# Patient Record
Sex: Female | Born: 1935 | Race: White | Hispanic: No | Marital: Married | Smoking: Former smoker
Health system: Southern US, Community
[De-identification: ages and names within clinical notes are randomized; demographics above are authoritative.]

## PROBLEM LIST (undated history)

## (undated) DIAGNOSIS — Z87442 Personal history of urinary calculi: Secondary | ICD-10-CM

## (undated) DIAGNOSIS — I739 Peripheral vascular disease, unspecified: Secondary | ICD-10-CM

## (undated) DIAGNOSIS — M1990 Unspecified osteoarthritis, unspecified site: Secondary | ICD-10-CM

## (undated) DIAGNOSIS — J449 Chronic obstructive pulmonary disease, unspecified: Secondary | ICD-10-CM

## (undated) DIAGNOSIS — M8580 Other specified disorders of bone density and structure, unspecified site: Secondary | ICD-10-CM

## (undated) DIAGNOSIS — Z8551 Personal history of malignant neoplasm of bladder: Secondary | ICD-10-CM

## (undated) DIAGNOSIS — K648 Other hemorrhoids: Secondary | ICD-10-CM

## (undated) DIAGNOSIS — E669 Obesity, unspecified: Secondary | ICD-10-CM

## (undated) DIAGNOSIS — K2970 Gastritis, unspecified, without bleeding: Secondary | ICD-10-CM

## (undated) DIAGNOSIS — E042 Nontoxic multinodular goiter: Secondary | ICD-10-CM

## (undated) DIAGNOSIS — E041 Nontoxic single thyroid nodule: Secondary | ICD-10-CM

## (undated) DIAGNOSIS — I872 Venous insufficiency (chronic) (peripheral): Secondary | ICD-10-CM

## (undated) DIAGNOSIS — I10 Essential (primary) hypertension: Secondary | ICD-10-CM

## (undated) DIAGNOSIS — E785 Hyperlipidemia, unspecified: Secondary | ICD-10-CM

## (undated) DIAGNOSIS — K219 Gastro-esophageal reflux disease without esophagitis: Secondary | ICD-10-CM

## (undated) DIAGNOSIS — Z7901 Long term (current) use of anticoagulants: Secondary | ICD-10-CM

## (undated) DIAGNOSIS — Z860100 Personal history of colon polyps, unspecified: Secondary | ICD-10-CM

## (undated) DIAGNOSIS — F419 Anxiety disorder, unspecified: Secondary | ICD-10-CM

## (undated) DIAGNOSIS — I4821 Permanent atrial fibrillation: Secondary | ICD-10-CM

## (undated) DIAGNOSIS — Z85118 Personal history of other malignant neoplasm of bronchus and lung: Secondary | ICD-10-CM

## (undated) DIAGNOSIS — F40240 Claustrophobia: Secondary | ICD-10-CM

## (undated) DIAGNOSIS — I4891 Unspecified atrial fibrillation: Secondary | ICD-10-CM

## (undated) DIAGNOSIS — K5730 Diverticulosis of large intestine without perforation or abscess without bleeding: Secondary | ICD-10-CM

## (undated) HISTORY — DX: Personal history of malignant neoplasm of bladder: Z85.51

## (undated) HISTORY — DX: Obesity, unspecified: E66.9

## (undated) HISTORY — DX: Other hemorrhoids: K64.8

## (undated) HISTORY — DX: Personal history of urinary calculi: Z87.442

## (undated) HISTORY — PX: ESOPHAGOGASTRODUODENOSCOPY: SHX1529

## (undated) HISTORY — DX: Claustrophobia: F40.240

## (undated) HISTORY — DX: Chronic obstructive pulmonary disease, unspecified: J44.9

## (undated) HISTORY — DX: Gastritis, unspecified, without bleeding: K29.70

## (undated) HISTORY — DX: Other specified disorders of bone density and structure, unspecified site: M85.80

## (undated) HISTORY — DX: Hyperlipidemia, unspecified: E78.5

## (undated) HISTORY — DX: Personal history of colonic polyps: Z86.010

## (undated) HISTORY — DX: Diverticulosis of large intestine without perforation or abscess without bleeding: K57.30

## (undated) HISTORY — DX: Personal history of other malignant neoplasm of bronchus and lung: Z85.118

## (undated) HISTORY — DX: Personal history of colon polyps, unspecified: Z86.0100

## (undated) HISTORY — DX: Nontoxic single thyroid nodule: E04.1

## (undated) HISTORY — DX: Long term (current) use of anticoagulants: Z79.01

## (undated) HISTORY — DX: Essential (primary) hypertension: I10

## (undated) HISTORY — DX: Anxiety disorder, unspecified: F41.9

## (undated) HISTORY — DX: Gastro-esophageal reflux disease without esophagitis: K21.9

## (undated) HISTORY — DX: Unspecified osteoarthritis, unspecified site: M19.90

## (undated) HISTORY — PX: COLONOSCOPY: SHX174

## (undated) HISTORY — DX: Nontoxic multinodular goiter: E04.2

## (undated) HISTORY — DX: Peripheral vascular disease, unspecified: I73.9

## (undated) HISTORY — DX: Venous insufficiency (chronic) (peripheral): I87.2

---

## 1989-02-04 HISTORY — PX: OTHER SURGICAL HISTORY: SHX169

## 1998-12-12 ENCOUNTER — Other Ambulatory Visit: Admission: RE | Admit: 1998-12-12 | Discharge: 1998-12-12 | Payer: Self-pay | Admitting: *Deleted

## 1999-02-07 ENCOUNTER — Encounter: Payer: Self-pay | Admitting: Orthopedic Surgery

## 1999-02-07 ENCOUNTER — Encounter: Admission: RE | Admit: 1999-02-07 | Discharge: 1999-02-07 | Payer: Self-pay | Admitting: Orthopedic Surgery

## 1999-03-05 ENCOUNTER — Other Ambulatory Visit: Admission: RE | Admit: 1999-03-05 | Discharge: 1999-03-05 | Payer: Self-pay | Admitting: Orthopedic Surgery

## 1999-11-05 HISTORY — PX: OTHER SURGICAL HISTORY: SHX169

## 1999-11-06 ENCOUNTER — Encounter: Payer: Self-pay | Admitting: Urology

## 1999-11-08 ENCOUNTER — Encounter: Payer: Self-pay | Admitting: Urology

## 1999-11-12 ENCOUNTER — Ambulatory Visit (HOSPITAL_COMMUNITY): Admission: RE | Admit: 1999-11-12 | Discharge: 1999-11-12 | Payer: Self-pay | Admitting: Urology

## 1999-11-12 ENCOUNTER — Encounter (INDEPENDENT_AMBULATORY_CARE_PROVIDER_SITE_OTHER): Payer: Self-pay

## 2000-02-05 HISTORY — PX: OTHER SURGICAL HISTORY: SHX169

## 2000-03-24 ENCOUNTER — Ambulatory Visit (HOSPITAL_COMMUNITY): Admission: RE | Admit: 2000-03-24 | Discharge: 2000-03-24 | Payer: Self-pay | Admitting: Pulmonary Disease

## 2000-03-24 ENCOUNTER — Encounter: Payer: Self-pay | Admitting: Pulmonary Disease

## 2000-04-09 ENCOUNTER — Encounter: Payer: Self-pay | Admitting: Orthopedic Surgery

## 2000-04-16 ENCOUNTER — Encounter: Payer: Self-pay | Admitting: Orthopedic Surgery

## 2000-04-16 ENCOUNTER — Inpatient Hospital Stay (HOSPITAL_COMMUNITY): Admission: RE | Admit: 2000-04-16 | Discharge: 2000-04-21 | Payer: Self-pay | Admitting: Orthopedic Surgery

## 2000-05-05 ENCOUNTER — Encounter: Admission: RE | Admit: 2000-05-05 | Discharge: 2000-05-28 | Payer: Self-pay | Admitting: Orthopedic Surgery

## 2000-06-04 ENCOUNTER — Inpatient Hospital Stay (HOSPITAL_COMMUNITY): Admission: RE | Admit: 2000-06-04 | Discharge: 2000-06-05 | Payer: Self-pay | Admitting: Orthopedic Surgery

## 2002-06-14 ENCOUNTER — Other Ambulatory Visit: Admission: RE | Admit: 2002-06-14 | Discharge: 2002-06-14 | Payer: Self-pay | Admitting: Obstetrics and Gynecology

## 2002-12-27 ENCOUNTER — Ambulatory Visit (HOSPITAL_COMMUNITY): Admission: RE | Admit: 2002-12-27 | Discharge: 2002-12-27 | Payer: Self-pay | Admitting: Pulmonary Disease

## 2003-01-05 ENCOUNTER — Ambulatory Visit (HOSPITAL_COMMUNITY): Admission: RE | Admit: 2003-01-05 | Discharge: 2003-01-05 | Payer: Self-pay | Admitting: Pulmonary Disease

## 2003-01-05 ENCOUNTER — Encounter (INDEPENDENT_AMBULATORY_CARE_PROVIDER_SITE_OTHER): Payer: Self-pay | Admitting: *Deleted

## 2003-01-17 ENCOUNTER — Ambulatory Visit (HOSPITAL_COMMUNITY): Admission: RE | Admit: 2003-01-17 | Discharge: 2003-01-17 | Payer: Self-pay | Admitting: Pulmonary Disease

## 2003-05-09 ENCOUNTER — Ambulatory Visit (HOSPITAL_COMMUNITY): Admission: RE | Admit: 2003-05-09 | Discharge: 2003-05-09 | Payer: Self-pay | Admitting: Pulmonary Disease

## 2003-11-25 ENCOUNTER — Ambulatory Visit (HOSPITAL_COMMUNITY): Admission: RE | Admit: 2003-11-25 | Discharge: 2003-11-25 | Payer: Self-pay | Admitting: Pulmonary Disease

## 2003-12-06 HISTORY — PX: OTHER SURGICAL HISTORY: SHX169

## 2003-12-21 ENCOUNTER — Ambulatory Visit: Payer: Self-pay | Admitting: Pulmonary Disease

## 2004-01-03 ENCOUNTER — Encounter (INDEPENDENT_AMBULATORY_CARE_PROVIDER_SITE_OTHER): Payer: Self-pay | Admitting: Specialist

## 2004-01-03 ENCOUNTER — Inpatient Hospital Stay (HOSPITAL_COMMUNITY): Admission: RE | Admit: 2004-01-03 | Discharge: 2004-01-08 | Payer: Self-pay | Admitting: Thoracic Surgery

## 2004-01-03 ENCOUNTER — Ambulatory Visit: Payer: Self-pay | Admitting: Internal Medicine

## 2004-01-16 ENCOUNTER — Ambulatory Visit: Payer: Self-pay | Admitting: Internal Medicine

## 2004-01-19 ENCOUNTER — Encounter: Admission: RE | Admit: 2004-01-19 | Discharge: 2004-01-19 | Payer: Self-pay | Admitting: Thoracic Surgery

## 2004-02-21 ENCOUNTER — Encounter: Admission: RE | Admit: 2004-02-21 | Discharge: 2004-02-21 | Payer: Self-pay | Admitting: Thoracic Surgery

## 2004-03-02 ENCOUNTER — Ambulatory Visit (HOSPITAL_COMMUNITY): Admission: RE | Admit: 2004-03-02 | Discharge: 2004-03-02 | Payer: Self-pay | Admitting: Internal Medicine

## 2004-03-05 ENCOUNTER — Ambulatory Visit: Payer: Self-pay | Admitting: Internal Medicine

## 2004-03-12 ENCOUNTER — Encounter: Admission: RE | Admit: 2004-03-12 | Discharge: 2004-03-12 | Payer: Self-pay | Admitting: Internal Medicine

## 2004-03-21 ENCOUNTER — Ambulatory Visit: Payer: Self-pay | Admitting: Pulmonary Disease

## 2004-04-04 ENCOUNTER — Encounter: Admission: RE | Admit: 2004-04-04 | Discharge: 2004-04-04 | Payer: Self-pay | Admitting: Thoracic Surgery

## 2004-06-08 ENCOUNTER — Ambulatory Visit: Payer: Self-pay | Admitting: Internal Medicine

## 2004-06-14 ENCOUNTER — Ambulatory Visit (HOSPITAL_COMMUNITY): Admission: RE | Admit: 2004-06-14 | Discharge: 2004-06-14 | Payer: Self-pay | Admitting: Internal Medicine

## 2004-08-24 ENCOUNTER — Ambulatory Visit: Payer: Self-pay | Admitting: Pulmonary Disease

## 2004-09-05 ENCOUNTER — Encounter: Admission: RE | Admit: 2004-09-05 | Discharge: 2004-09-05 | Payer: Self-pay | Admitting: Thoracic Surgery

## 2004-11-23 ENCOUNTER — Ambulatory Visit: Payer: Self-pay | Admitting: Pulmonary Disease

## 2004-12-10 ENCOUNTER — Ambulatory Visit: Payer: Self-pay | Admitting: Internal Medicine

## 2004-12-13 ENCOUNTER — Ambulatory Visit (HOSPITAL_COMMUNITY): Admission: RE | Admit: 2004-12-13 | Discharge: 2004-12-13 | Payer: Self-pay | Admitting: Internal Medicine

## 2005-03-13 ENCOUNTER — Encounter: Admission: RE | Admit: 2005-03-13 | Discharge: 2005-03-13 | Payer: Self-pay | Admitting: Thoracic Surgery

## 2005-04-05 ENCOUNTER — Encounter: Admission: RE | Admit: 2005-04-05 | Discharge: 2005-04-05 | Payer: Self-pay | Admitting: Endocrinology

## 2005-04-11 ENCOUNTER — Other Ambulatory Visit: Admission: RE | Admit: 2005-04-11 | Discharge: 2005-04-11 | Payer: Self-pay | Admitting: Diagnostic Radiology

## 2005-04-11 ENCOUNTER — Encounter (INDEPENDENT_AMBULATORY_CARE_PROVIDER_SITE_OTHER): Payer: Self-pay | Admitting: Specialist

## 2005-04-11 ENCOUNTER — Encounter: Admission: RE | Admit: 2005-04-11 | Discharge: 2005-04-11 | Payer: Self-pay | Admitting: Endocrinology

## 2005-04-29 ENCOUNTER — Ambulatory Visit: Payer: Self-pay | Admitting: Pulmonary Disease

## 2005-05-31 ENCOUNTER — Ambulatory Visit: Payer: Self-pay | Admitting: Pulmonary Disease

## 2005-06-06 ENCOUNTER — Ambulatory Visit: Payer: Self-pay | Admitting: Internal Medicine

## 2005-06-10 LAB — CBC WITH DIFFERENTIAL/PLATELET
BASO%: 0.6 % (ref 0.0–2.0)
Basophils Absolute: 0 10*3/uL (ref 0.0–0.1)
EOS%: 1.8 % (ref 0.0–7.0)
HCT: 41.3 % (ref 34.8–46.6)
HGB: 14.2 g/dL (ref 11.6–15.9)
MCH: 33.2 pg (ref 26.0–34.0)
MONO#: 0.5 10*3/uL (ref 0.1–0.9)
NEUT#: 4.3 10*3/uL (ref 1.5–6.5)
NEUT%: 63.5 % (ref 39.6–76.8)
RDW: 12.8 % (ref 11.3–14.5)
WBC: 6.8 10*3/uL (ref 3.9–10.0)
lymph#: 1.9 10*3/uL (ref 0.9–3.3)

## 2005-06-10 LAB — COMPREHENSIVE METABOLIC PANEL
ALT: 40 U/L (ref 0–40)
AST: 28 U/L (ref 0–37)
Albumin: 4.6 g/dL (ref 3.5–5.2)
BUN: 22 mg/dL (ref 6–23)
CO2: 25 mEq/L (ref 19–32)
Calcium: 9.7 mg/dL (ref 8.4–10.5)
Chloride: 103 mEq/L (ref 96–112)
Creatinine, Ser: 0.7 mg/dL (ref 0.4–1.2)
Potassium: 4.5 mEq/L (ref 3.5–5.3)

## 2005-06-13 ENCOUNTER — Ambulatory Visit (HOSPITAL_COMMUNITY): Admission: RE | Admit: 2005-06-13 | Discharge: 2005-06-13 | Payer: Self-pay | Admitting: Internal Medicine

## 2005-08-15 ENCOUNTER — Ambulatory Visit: Payer: Self-pay | Admitting: Internal Medicine

## 2005-08-29 ENCOUNTER — Ambulatory Visit: Payer: Self-pay | Admitting: Pulmonary Disease

## 2005-09-11 ENCOUNTER — Encounter: Admission: RE | Admit: 2005-09-11 | Discharge: 2005-09-11 | Payer: Self-pay | Admitting: Thoracic Surgery

## 2005-09-12 ENCOUNTER — Ambulatory Visit: Payer: Self-pay | Admitting: Cardiology

## 2005-10-10 ENCOUNTER — Ambulatory Visit: Payer: Self-pay | Admitting: Cardiology

## 2005-11-18 ENCOUNTER — Ambulatory Visit: Payer: Self-pay

## 2005-11-18 LAB — CONVERTED CEMR LAB
ALT: 36 units/L (ref 0–40)
Total Bilirubin: 0.7 mg/dL (ref 0.3–1.2)
Total Protein: 7.3 g/dL (ref 6.0–8.3)

## 2005-12-06 ENCOUNTER — Ambulatory Visit: Payer: Self-pay | Admitting: Internal Medicine

## 2005-12-16 ENCOUNTER — Ambulatory Visit (HOSPITAL_COMMUNITY): Admission: RE | Admit: 2005-12-16 | Discharge: 2005-12-16 | Payer: Self-pay | Admitting: Internal Medicine

## 2005-12-16 LAB — CBC WITH DIFFERENTIAL/PLATELET
BASO%: 0.5 % (ref 0.0–2.0)
EOS%: 2.3 % (ref 0.0–7.0)
Eosinophils Absolute: 0.2 10*3/uL (ref 0.0–0.5)
LYMPH%: 24.9 % (ref 14.0–48.0)
MCHC: 34.4 g/dL (ref 32.0–36.0)
MCV: 97 fL (ref 81.0–101.0)
MONO%: 6.1 % (ref 0.0–13.0)
NEUT#: 4.8 10*3/uL (ref 1.5–6.5)
RBC: 4.66 10*6/uL (ref 3.70–5.32)
RDW: 12.3 % (ref 11.3–14.5)

## 2005-12-16 LAB — COMPREHENSIVE METABOLIC PANEL
ALT: 73 U/L — ABNORMAL HIGH (ref 0–35)
AST: 59 U/L — ABNORMAL HIGH (ref 0–37)
Albumin: 4.3 g/dL (ref 3.5–5.2)
Alkaline Phosphatase: 48 U/L (ref 39–117)
Glucose, Bld: 132 mg/dL — ABNORMAL HIGH (ref 70–99)
Potassium: 4.7 mEq/L (ref 3.5–5.3)
Sodium: 138 mEq/L (ref 135–145)
Total Bilirubin: 0.9 mg/dL (ref 0.3–1.2)
Total Protein: 7.8 g/dL (ref 6.0–8.3)

## 2005-12-16 LAB — LIPID PANEL
LDL Cholesterol: 75 mg/dL (ref 0–99)
VLDL: 19 mg/dL (ref 0–40)

## 2005-12-17 ENCOUNTER — Ambulatory Visit: Payer: Self-pay | Admitting: Pulmonary Disease

## 2006-01-02 ENCOUNTER — Ambulatory Visit: Payer: Self-pay | Admitting: Internal Medicine

## 2006-01-07 ENCOUNTER — Ambulatory Visit: Payer: Self-pay | Admitting: Pulmonary Disease

## 2006-02-19 ENCOUNTER — Ambulatory Visit: Payer: Self-pay | Admitting: Pulmonary Disease

## 2006-03-20 ENCOUNTER — Ambulatory Visit: Payer: Self-pay | Admitting: Pulmonary Disease

## 2006-03-20 LAB — CONVERTED CEMR LAB
Bilirubin, Direct: 0.1 mg/dL (ref 0.0–0.3)
LDL Cholesterol: 96 mg/dL (ref 0–99)
Total Bilirubin: 0.8 mg/dL (ref 0.3–1.2)
Total Protein: 7.1 g/dL (ref 6.0–8.3)

## 2006-03-26 ENCOUNTER — Encounter: Admission: RE | Admit: 2006-03-26 | Discharge: 2006-03-26 | Payer: Self-pay | Admitting: Thoracic Surgery

## 2006-03-26 ENCOUNTER — Ambulatory Visit: Payer: Self-pay | Admitting: Thoracic Surgery

## 2006-03-27 ENCOUNTER — Ambulatory Visit: Payer: Self-pay | Admitting: Cardiology

## 2006-04-09 ENCOUNTER — Encounter: Admission: RE | Admit: 2006-04-09 | Discharge: 2006-04-09 | Payer: Self-pay | Admitting: Surgery

## 2006-06-13 ENCOUNTER — Ambulatory Visit: Payer: Self-pay | Admitting: Internal Medicine

## 2006-06-17 LAB — CBC WITH DIFFERENTIAL/PLATELET
BASO%: 0.4 % (ref 0.0–2.0)
EOS%: 1.7 % (ref 0.0–7.0)
HCT: 38.7 % (ref 34.8–46.6)
LYMPH%: 26.9 % (ref 14.0–48.0)
MCH: 33.3 pg (ref 26.0–34.0)
MCHC: 35.1 g/dL (ref 32.0–36.0)
MONO%: 5.6 % (ref 0.0–13.0)
NEUT%: 65.4 % (ref 39.6–76.8)
Platelets: 233 10*3/uL (ref 145–400)
RBC: 4.08 10*6/uL (ref 3.70–5.32)

## 2006-06-17 LAB — COMPREHENSIVE METABOLIC PANEL
ALT: 29 U/L (ref 0–35)
AST: 25 U/L (ref 0–37)
Albumin: 4.4 g/dL (ref 3.5–5.2)
Alkaline Phosphatase: 43 U/L (ref 39–117)
BUN: 19 mg/dL (ref 6–23)
Potassium: 4.3 mEq/L (ref 3.5–5.3)
Sodium: 143 mEq/L (ref 135–145)
Total Protein: 7.1 g/dL (ref 6.0–8.3)

## 2006-06-18 ENCOUNTER — Ambulatory Visit (HOSPITAL_COMMUNITY): Admission: RE | Admit: 2006-06-18 | Discharge: 2006-06-18 | Payer: Self-pay | Admitting: Internal Medicine

## 2006-10-02 ENCOUNTER — Ambulatory Visit: Payer: Self-pay | Admitting: Pulmonary Disease

## 2006-10-02 LAB — CONVERTED CEMR LAB
ALT: 32 units/L (ref 0–35)
Alkaline Phosphatase: 47 units/L (ref 39–117)
CO2: 28 meq/L (ref 19–32)
Calcium: 9.4 mg/dL (ref 8.4–10.5)
Chloride: 108 meq/L (ref 96–112)
Cholesterol: 183 mg/dL (ref 0–200)
Creatinine, Ser: 0.6 mg/dL (ref 0.4–1.2)
Direct LDL: 111.5 mg/dL
Eosinophils Absolute: 0.1 10*3/uL (ref 0.0–0.6)
Eosinophils Relative: 1.9 % (ref 0.0–5.0)
GFR calc non Af Amer: 105 mL/min
Glucose, Bld: 137 mg/dL — ABNORMAL HIGH (ref 70–99)
HCT: 38.8 % (ref 36.0–46.0)
Hemoglobin: 13.3 g/dL (ref 12.0–15.0)
MCHC: 34.4 g/dL (ref 30.0–36.0)
MCV: 96.7 fL (ref 78.0–100.0)
Monocytes Absolute: 0.3 10*3/uL (ref 0.2–0.7)
Monocytes Relative: 3.6 % (ref 3.0–11.0)
Neutrophils Relative %: 67.9 % (ref 43.0–77.0)
Potassium: 5.4 meq/L — ABNORMAL HIGH (ref 3.5–5.1)
RBC: 4.01 M/uL (ref 3.87–5.11)
RDW: 12.1 % (ref 11.5–14.6)
Total Protein: 7.2 g/dL (ref 6.0–8.3)
Triglycerides: 207 mg/dL (ref 0–149)
VLDL: 41 mg/dL — ABNORMAL HIGH (ref 0–40)

## 2006-10-08 ENCOUNTER — Ambulatory Visit: Payer: Self-pay | Admitting: Pulmonary Disease

## 2006-11-05 ENCOUNTER — Encounter (HOSPITAL_COMMUNITY): Admission: RE | Admit: 2006-11-05 | Discharge: 2007-02-03 | Payer: Self-pay | Admitting: Pulmonary Disease

## 2006-12-21 ENCOUNTER — Ambulatory Visit: Payer: Self-pay | Admitting: Internal Medicine

## 2006-12-24 ENCOUNTER — Ambulatory Visit (HOSPITAL_COMMUNITY): Admission: RE | Admit: 2006-12-24 | Discharge: 2006-12-24 | Payer: Self-pay | Admitting: Internal Medicine

## 2006-12-24 LAB — CBC WITH DIFFERENTIAL/PLATELET
Basophils Absolute: 0.1 10*3/uL (ref 0.0–0.1)
EOS%: 2.8 % (ref 0.0–7.0)
HGB: 15.6 g/dL (ref 11.6–15.9)
MCH: 33.3 pg (ref 26.0–34.0)
MCHC: 35.3 g/dL (ref 32.0–36.0)
MCV: 94.3 fL (ref 81.0–101.0)
MONO%: 6.7 % (ref 0.0–13.0)
RDW: 12.8 % (ref 11.3–14.5)

## 2006-12-24 LAB — COMPREHENSIVE METABOLIC PANEL
AST: 34 U/L (ref 0–37)
Albumin: 4.2 g/dL (ref 3.5–5.2)
Alkaline Phosphatase: 37 U/L — ABNORMAL LOW (ref 39–117)
BUN: 12 mg/dL (ref 6–23)
Creatinine, Ser: 0.6 mg/dL (ref 0.40–1.20)
Potassium: 4.5 mEq/L (ref 3.5–5.3)

## 2007-01-19 ENCOUNTER — Telehealth (INDEPENDENT_AMBULATORY_CARE_PROVIDER_SITE_OTHER): Payer: Self-pay | Admitting: *Deleted

## 2007-01-19 DIAGNOSIS — K219 Gastro-esophageal reflux disease without esophagitis: Secondary | ICD-10-CM

## 2007-01-19 DIAGNOSIS — I10 Essential (primary) hypertension: Secondary | ICD-10-CM | POA: Insufficient documentation

## 2007-01-19 DIAGNOSIS — M1990 Unspecified osteoarthritis, unspecified site: Secondary | ICD-10-CM | POA: Insufficient documentation

## 2007-01-19 DIAGNOSIS — Z87442 Personal history of urinary calculi: Secondary | ICD-10-CM

## 2007-01-19 DIAGNOSIS — I739 Peripheral vascular disease, unspecified: Secondary | ICD-10-CM | POA: Insufficient documentation

## 2007-01-19 DIAGNOSIS — F411 Generalized anxiety disorder: Secondary | ICD-10-CM | POA: Insufficient documentation

## 2007-01-19 DIAGNOSIS — E785 Hyperlipidemia, unspecified: Secondary | ICD-10-CM

## 2007-02-05 ENCOUNTER — Encounter (HOSPITAL_COMMUNITY): Admission: RE | Admit: 2007-02-05 | Discharge: 2007-03-06 | Payer: Self-pay | Admitting: Pulmonary Disease

## 2007-03-08 ENCOUNTER — Encounter (HOSPITAL_COMMUNITY): Admission: RE | Admit: 2007-03-08 | Discharge: 2007-04-30 | Payer: Self-pay | Admitting: Pulmonary Disease

## 2007-03-11 ENCOUNTER — Encounter: Payer: Self-pay | Admitting: Pulmonary Disease

## 2007-04-20 ENCOUNTER — Encounter: Admission: RE | Admit: 2007-04-20 | Discharge: 2007-04-20 | Payer: Self-pay | Admitting: Surgery

## 2007-04-28 ENCOUNTER — Encounter: Payer: Self-pay | Admitting: Pulmonary Disease

## 2007-04-30 ENCOUNTER — Telehealth: Payer: Self-pay | Admitting: Pulmonary Disease

## 2007-05-01 ENCOUNTER — Telehealth (INDEPENDENT_AMBULATORY_CARE_PROVIDER_SITE_OTHER): Payer: Self-pay | Admitting: *Deleted

## 2007-05-28 ENCOUNTER — Ambulatory Visit: Payer: Self-pay | Admitting: Pulmonary Disease

## 2007-05-28 DIAGNOSIS — J45909 Unspecified asthma, uncomplicated: Secondary | ICD-10-CM | POA: Insufficient documentation

## 2007-05-28 DIAGNOSIS — E042 Nontoxic multinodular goiter: Secondary | ICD-10-CM

## 2007-05-28 DIAGNOSIS — I872 Venous insufficiency (chronic) (peripheral): Secondary | ICD-10-CM | POA: Insufficient documentation

## 2007-05-28 DIAGNOSIS — C679 Malignant neoplasm of bladder, unspecified: Secondary | ICD-10-CM | POA: Insufficient documentation

## 2007-06-11 ENCOUNTER — Ambulatory Visit: Payer: Self-pay | Admitting: Pulmonary Disease

## 2007-06-12 ENCOUNTER — Telehealth (INDEPENDENT_AMBULATORY_CARE_PROVIDER_SITE_OTHER): Payer: Self-pay | Admitting: *Deleted

## 2007-06-14 LAB — CONVERTED CEMR LAB
AST: 36 units/L (ref 0–37)
BUN: 20 mg/dL (ref 6–23)
Basophils Absolute: 0.1 10*3/uL (ref 0.0–0.1)
CO2: 26 meq/L (ref 19–32)
Chloride: 109 meq/L (ref 96–112)
Cholesterol: 188 mg/dL (ref 0–200)
Creatinine, Ser: 0.8 mg/dL (ref 0.4–1.2)
Eosinophils Relative: 2.7 % (ref 0.0–5.0)
GFR calc Af Amer: 91 mL/min
GFR calc non Af Amer: 75 mL/min
Lymphocytes Relative: 32.4 % (ref 12.0–46.0)
MCV: 93.7 fL (ref 78.0–100.0)
Monocytes Relative: 7.7 % (ref 3.0–12.0)
Neutrophils Relative %: 56.2 % (ref 43.0–77.0)
Potassium: 4.8 meq/L (ref 3.5–5.1)
RDW: 12.1 % (ref 11.5–14.6)
Total Bilirubin: 0.8 mg/dL (ref 0.3–1.2)
Total CHOL/HDL Ratio: 6.6
Total Protein: 7.1 g/dL (ref 6.0–8.3)
Triglycerides: 111 mg/dL (ref 0–149)
WBC: 5.6 10*3/uL (ref 4.5–10.5)

## 2007-08-10 ENCOUNTER — Ambulatory Visit: Payer: Self-pay | Admitting: Internal Medicine

## 2007-08-17 ENCOUNTER — Ambulatory Visit: Payer: Self-pay | Admitting: Pulmonary Disease

## 2007-08-17 DIAGNOSIS — M899 Disorder of bone, unspecified: Secondary | ICD-10-CM | POA: Insufficient documentation

## 2007-08-17 DIAGNOSIS — M949 Disorder of cartilage, unspecified: Secondary | ICD-10-CM

## 2007-08-24 ENCOUNTER — Telehealth (INDEPENDENT_AMBULATORY_CARE_PROVIDER_SITE_OTHER): Payer: Self-pay | Admitting: *Deleted

## 2007-08-27 ENCOUNTER — Encounter: Payer: Self-pay | Admitting: Internal Medicine

## 2007-08-27 ENCOUNTER — Ambulatory Visit: Payer: Self-pay | Admitting: Internal Medicine

## 2007-08-31 ENCOUNTER — Encounter: Payer: Self-pay | Admitting: Internal Medicine

## 2007-10-29 ENCOUNTER — Encounter: Payer: Self-pay | Admitting: Adult Health

## 2007-10-29 ENCOUNTER — Ambulatory Visit: Payer: Self-pay | Admitting: Internal Medicine

## 2007-10-29 DIAGNOSIS — J4489 Other specified chronic obstructive pulmonary disease: Secondary | ICD-10-CM | POA: Insufficient documentation

## 2007-10-29 DIAGNOSIS — J449 Chronic obstructive pulmonary disease, unspecified: Secondary | ICD-10-CM

## 2007-10-30 LAB — CONVERTED CEMR LAB: Pro B Natriuretic peptide (BNP): 34 pg/mL (ref 0.0–100.0)

## 2007-11-02 ENCOUNTER — Ambulatory Visit: Payer: Self-pay | Admitting: Cardiovascular Disease

## 2007-11-12 ENCOUNTER — Encounter: Payer: Self-pay | Admitting: Cardiology

## 2007-11-12 ENCOUNTER — Encounter: Payer: Self-pay | Admitting: Cardiovascular Disease

## 2007-11-12 ENCOUNTER — Ambulatory Visit: Payer: Self-pay

## 2007-11-25 ENCOUNTER — Ambulatory Visit: Payer: Self-pay | Admitting: Cardiology

## 2007-11-25 ENCOUNTER — Ambulatory Visit: Payer: Self-pay | Admitting: Pulmonary Disease

## 2007-11-25 LAB — CONVERTED CEMR LAB
BUN: 20 mg/dL (ref 6–23)
CO2: 30 meq/L (ref 19–32)
GFR calc non Af Amer: 88 mL/min
Glucose, Bld: 108 mg/dL — ABNORMAL HIGH (ref 70–99)

## 2007-12-22 ENCOUNTER — Ambulatory Visit: Payer: Self-pay | Admitting: Internal Medicine

## 2007-12-24 ENCOUNTER — Telehealth: Payer: Self-pay | Admitting: Pulmonary Disease

## 2007-12-24 ENCOUNTER — Encounter: Payer: Self-pay | Admitting: Pulmonary Disease

## 2008-01-04 ENCOUNTER — Ambulatory Visit: Payer: Self-pay | Admitting: Pulmonary Disease

## 2008-01-18 DIAGNOSIS — E669 Obesity, unspecified: Secondary | ICD-10-CM

## 2008-01-19 ENCOUNTER — Ambulatory Visit: Payer: Self-pay

## 2008-02-10 ENCOUNTER — Encounter: Payer: Self-pay | Admitting: Pulmonary Disease

## 2008-02-15 ENCOUNTER — Ambulatory Visit (HOSPITAL_COMMUNITY): Admission: RE | Admit: 2008-02-15 | Discharge: 2008-02-15 | Payer: Self-pay | Admitting: Cardiovascular Disease

## 2008-03-02 ENCOUNTER — Encounter: Payer: Self-pay | Admitting: Pulmonary Disease

## 2008-03-07 ENCOUNTER — Ambulatory Visit: Payer: Self-pay | Admitting: Pulmonary Disease

## 2008-05-16 ENCOUNTER — Ambulatory Visit: Payer: Self-pay | Admitting: Pulmonary Disease

## 2008-05-19 ENCOUNTER — Encounter: Payer: Self-pay | Admitting: Pulmonary Disease

## 2008-05-20 LAB — CONVERTED CEMR LAB
Albumin: 4.1 g/dL (ref 3.5–5.2)
Alkaline Phosphatase: 32 units/L — ABNORMAL LOW (ref 39–117)
BUN: 18 mg/dL (ref 6–23)
CO2: 32 meq/L (ref 19–32)
Calcium: 10.1 mg/dL (ref 8.4–10.5)
Creatinine, Ser: 0.7 mg/dL (ref 0.4–1.2)
Glucose, Bld: 131 mg/dL — ABNORMAL HIGH (ref 70–99)
Hemoglobin: 14.8 g/dL (ref 12.0–15.0)
Lymphocytes Relative: 29.7 % (ref 12.0–46.0)
MCHC: 34.1 g/dL (ref 30.0–36.0)
Monocytes Absolute: 0.5 10*3/uL (ref 0.1–1.0)
Neutro Abs: 4.3 10*3/uL (ref 1.4–7.7)
Neutrophils Relative %: 60.7 % (ref 43.0–77.0)
Potassium: 4 meq/L (ref 3.5–5.1)
RDW: 11.8 % (ref 11.5–14.6)
Sodium: 145 meq/L (ref 135–145)
Total Bilirubin: 0.6 mg/dL (ref 0.3–1.2)
Total Protein: 7.8 g/dL (ref 6.0–8.3)
Vitamin B-12: 341 pg/mL (ref 211–911)
WBC: 7.2 10*3/uL (ref 4.5–10.5)

## 2008-06-01 ENCOUNTER — Ambulatory Visit: Payer: Self-pay | Admitting: Pulmonary Disease

## 2008-06-01 DIAGNOSIS — E119 Type 2 diabetes mellitus without complications: Secondary | ICD-10-CM

## 2008-06-10 ENCOUNTER — Telehealth (INDEPENDENT_AMBULATORY_CARE_PROVIDER_SITE_OTHER): Payer: Self-pay | Admitting: *Deleted

## 2008-06-16 ENCOUNTER — Telehealth (INDEPENDENT_AMBULATORY_CARE_PROVIDER_SITE_OTHER): Payer: Self-pay | Admitting: *Deleted

## 2008-06-20 ENCOUNTER — Telehealth (INDEPENDENT_AMBULATORY_CARE_PROVIDER_SITE_OTHER): Payer: Self-pay | Admitting: *Deleted

## 2008-06-27 LAB — CONVERTED CEMR LAB
BUN: 26 mg/dL — ABNORMAL HIGH (ref 6–23)
CO2: 30 meq/L (ref 19–32)
Chloride: 107 meq/L (ref 96–112)
Cholesterol: 240 mg/dL — ABNORMAL HIGH (ref 0–200)
Glucose, Bld: 117 mg/dL — ABNORMAL HIGH (ref 70–99)
HDL: 48.5 mg/dL (ref >39.00)
Ketones, ur: NEGATIVE mg/dL
Leukocytes, UA: NEGATIVE
Potassium: 4.9 meq/L (ref 3.5–5.1)
Specific Gravity, Urine: 1.025 (ref 1.000–1.030)
Total Protein, Urine: 30 mg/dL
pH: 6 (ref 5.0–8.0)

## 2008-06-28 ENCOUNTER — Encounter: Admission: RE | Admit: 2008-06-28 | Discharge: 2008-06-28 | Payer: Self-pay | Admitting: Surgery

## 2008-07-28 ENCOUNTER — Encounter: Payer: Self-pay | Admitting: Pulmonary Disease

## 2008-08-22 ENCOUNTER — Telehealth: Payer: Self-pay | Admitting: Cardiovascular Disease

## 2008-08-22 ENCOUNTER — Telehealth (INDEPENDENT_AMBULATORY_CARE_PROVIDER_SITE_OTHER): Payer: Self-pay | Admitting: *Deleted

## 2008-08-25 ENCOUNTER — Ambulatory Visit: Payer: Self-pay | Admitting: Pulmonary Disease

## 2008-08-31 ENCOUNTER — Ambulatory Visit: Payer: Self-pay | Admitting: Pulmonary Disease

## 2008-08-31 LAB — CONVERTED CEMR LAB
CO2: 31 meq/L (ref 19–32)
Calcium: 10.5 mg/dL (ref 8.4–10.5)
Creatinine, Ser: 0.9 mg/dL (ref 0.4–1.2)
Direct LDL: 171.9 mg/dL
GFR calc non Af Amer: 65.45 mL/min (ref >60)
Glucose, Bld: 134 mg/dL — ABNORMAL HIGH (ref 70–99)
HDL: 36 mg/dL — ABNORMAL LOW (ref >39.00)
Hgb A1c MFr Bld: 6 % (ref 4.6–6.5)
Potassium: 4.7 meq/L (ref 3.5–5.1)
TSH: 0.89 microintl units/mL (ref 0.35–5.50)
Total CHOL/HDL Ratio: 6
Triglycerides: 152 mg/dL — ABNORMAL HIGH (ref 0.0–149.0)

## 2008-09-07 ENCOUNTER — Encounter: Payer: Self-pay | Admitting: Pulmonary Disease

## 2008-10-14 ENCOUNTER — Encounter: Payer: Self-pay | Admitting: Pulmonary Disease

## 2008-10-28 ENCOUNTER — Ambulatory Visit: Payer: Self-pay | Admitting: Pulmonary Disease

## 2008-11-22 ENCOUNTER — Encounter: Payer: Self-pay | Admitting: Pulmonary Disease

## 2008-12-19 ENCOUNTER — Ambulatory Visit: Payer: Self-pay | Admitting: Internal Medicine

## 2008-12-21 ENCOUNTER — Encounter: Payer: Self-pay | Admitting: Pulmonary Disease

## 2008-12-21 ENCOUNTER — Ambulatory Visit (HOSPITAL_COMMUNITY): Admission: RE | Admit: 2008-12-21 | Discharge: 2008-12-21 | Payer: Self-pay | Admitting: Internal Medicine

## 2008-12-21 LAB — CBC WITH DIFFERENTIAL/PLATELET
BASO%: 2.1 % — ABNORMAL HIGH (ref 0.0–2.0)
EOS%: 1.8 % (ref 0.0–7.0)
LYMPH%: 24.1 % (ref 14.0–49.7)
MCH: 33.2 pg (ref 25.1–34.0)
MCHC: 33.7 g/dL (ref 31.5–36.0)
MCV: 98.4 fL (ref 79.5–101.0)
MONO#: 0.4 10*3/uL (ref 0.1–0.9)
MONO%: 4.6 % (ref 0.0–14.0)
Platelets: 263 10*3/uL (ref 145–400)
RBC: 4.6 10*6/uL (ref 3.70–5.45)
WBC: 8.1 10*3/uL (ref 3.9–10.3)

## 2008-12-21 LAB — COMPREHENSIVE METABOLIC PANEL
ALT: 37 U/L — ABNORMAL HIGH (ref 0–35)
Alkaline Phosphatase: 42 U/L (ref 39–117)
Sodium: 138 mEq/L (ref 135–145)
Total Bilirubin: 0.7 mg/dL (ref 0.3–1.2)
Total Protein: 7.8 g/dL (ref 6.0–8.3)

## 2008-12-22 ENCOUNTER — Encounter: Payer: Self-pay | Admitting: Pulmonary Disease

## 2008-12-22 ENCOUNTER — Telehealth: Payer: Self-pay | Admitting: Pulmonary Disease

## 2008-12-28 ENCOUNTER — Ambulatory Visit: Payer: Self-pay | Admitting: Pulmonary Disease

## 2009-01-02 ENCOUNTER — Ambulatory Visit: Payer: Self-pay | Admitting: Pulmonary Disease

## 2009-01-02 LAB — CONVERTED CEMR LAB
BUN: 16 mg/dL (ref 6–23)
CO2: 28 meq/L (ref 19–32)
Calcium: 10.7 mg/dL — ABNORMAL HIGH (ref 8.4–10.5)
Cholesterol: 253 mg/dL — ABNORMAL HIGH (ref 0–200)
Creatinine, Ser: 0.7 mg/dL (ref 0.4–1.2)
Direct LDL: 207.5 mg/dL
Glucose, Bld: 148 mg/dL — ABNORMAL HIGH (ref 70–99)
HDL: 41.7 mg/dL (ref >39.00)
Potassium: 5.2 meq/L — ABNORMAL HIGH (ref 3.5–5.1)
Total CHOL/HDL Ratio: 6
Triglycerides: 125 mg/dL (ref 0.0–149.0)
VLDL: 25 mg/dL (ref 0.0–40.0)

## 2009-01-03 ENCOUNTER — Ambulatory Visit (HOSPITAL_COMMUNITY): Admission: RE | Admit: 2009-01-03 | Discharge: 2009-01-03 | Payer: Self-pay | Admitting: Internal Medicine

## 2009-01-04 HISTORY — PX: OTHER SURGICAL HISTORY: SHX169

## 2009-01-16 ENCOUNTER — Encounter: Payer: Self-pay | Admitting: Pulmonary Disease

## 2009-01-17 ENCOUNTER — Ambulatory Visit: Payer: Self-pay | Admitting: Thoracic Surgery

## 2009-01-23 ENCOUNTER — Ambulatory Visit: Admission: RE | Admit: 2009-01-23 | Discharge: 2009-01-23 | Payer: Self-pay | Admitting: Thoracic Surgery

## 2009-01-24 ENCOUNTER — Encounter: Payer: Self-pay | Admitting: Pulmonary Disease

## 2009-01-25 ENCOUNTER — Ambulatory Visit: Payer: Self-pay | Admitting: Internal Medicine

## 2009-01-25 ENCOUNTER — Ambulatory Visit: Payer: Self-pay | Admitting: Thoracic Surgery

## 2009-01-26 ENCOUNTER — Telehealth (INDEPENDENT_AMBULATORY_CARE_PROVIDER_SITE_OTHER): Payer: Self-pay | Admitting: *Deleted

## 2009-01-31 ENCOUNTER — Ambulatory Visit (HOSPITAL_COMMUNITY): Admission: RE | Admit: 2009-01-31 | Discharge: 2009-01-31 | Payer: Self-pay | Admitting: Obstetrics

## 2009-02-04 HISTORY — PX: OTHER SURGICAL HISTORY: SHX169

## 2009-02-06 ENCOUNTER — Ambulatory Visit: Payer: Self-pay | Admitting: Thoracic Surgery

## 2009-02-06 ENCOUNTER — Inpatient Hospital Stay (HOSPITAL_COMMUNITY): Admission: RE | Admit: 2009-02-06 | Discharge: 2009-02-14 | Payer: Self-pay | Admitting: Thoracic Surgery

## 2009-02-06 ENCOUNTER — Ambulatory Visit: Payer: Self-pay | Admitting: Pulmonary Disease

## 2009-02-06 ENCOUNTER — Encounter: Payer: Self-pay | Admitting: Thoracic Surgery

## 2009-02-16 ENCOUNTER — Telehealth: Payer: Self-pay | Admitting: Pulmonary Disease

## 2009-02-17 ENCOUNTER — Telehealth (INDEPENDENT_AMBULATORY_CARE_PROVIDER_SITE_OTHER): Payer: Self-pay | Admitting: *Deleted

## 2009-02-20 ENCOUNTER — Telehealth (INDEPENDENT_AMBULATORY_CARE_PROVIDER_SITE_OTHER): Payer: Self-pay | Admitting: *Deleted

## 2009-02-21 ENCOUNTER — Encounter: Admission: RE | Admit: 2009-02-21 | Discharge: 2009-02-21 | Payer: Self-pay | Admitting: Thoracic Surgery

## 2009-02-21 ENCOUNTER — Ambulatory Visit: Payer: Self-pay | Admitting: Thoracic Surgery

## 2009-03-03 ENCOUNTER — Telehealth: Payer: Self-pay | Admitting: Pulmonary Disease

## 2009-03-06 ENCOUNTER — Ambulatory Visit: Payer: Self-pay | Admitting: Pulmonary Disease

## 2009-03-07 ENCOUNTER — Ambulatory Visit: Payer: Self-pay | Admitting: Pulmonary Disease

## 2009-03-07 DIAGNOSIS — I4891 Unspecified atrial fibrillation: Secondary | ICD-10-CM | POA: Insufficient documentation

## 2009-03-07 LAB — CONVERTED CEMR LAB
ALT: 32 units/L (ref 0–35)
Alkaline Phosphatase: 34 units/L — ABNORMAL LOW (ref 39–117)
Calcium: 10.7 mg/dL — ABNORMAL HIGH (ref 8.4–10.5)
Total Protein: 7.4 g/dL (ref 6.0–8.3)
Triglycerides: 101 mg/dL (ref 0.0–149.0)
VLDL: 20.2 mg/dL (ref 0.0–40.0)

## 2009-03-22 ENCOUNTER — Encounter: Admission: RE | Admit: 2009-03-22 | Discharge: 2009-03-22 | Payer: Self-pay | Admitting: Thoracic Surgery

## 2009-03-22 ENCOUNTER — Ambulatory Visit: Payer: Self-pay | Admitting: Thoracic Surgery

## 2009-03-22 ENCOUNTER — Encounter: Payer: Self-pay | Admitting: Pulmonary Disease

## 2009-04-11 ENCOUNTER — Ambulatory Visit: Payer: Self-pay | Admitting: Internal Medicine

## 2009-04-13 ENCOUNTER — Encounter: Payer: Self-pay | Admitting: Pulmonary Disease

## 2009-04-13 LAB — COMPREHENSIVE METABOLIC PANEL
ALT: 29 U/L (ref 0–35)
AST: 31 U/L (ref 0–37)
Alkaline Phosphatase: 35 U/L — ABNORMAL LOW (ref 39–117)
CO2: 24 mEq/L (ref 19–32)
Potassium: 4.2 mEq/L (ref 3.5–5.3)
Total Bilirubin: 0.6 mg/dL (ref 0.3–1.2)
Total Protein: 7.6 g/dL (ref 6.0–8.3)

## 2009-04-13 LAB — CBC WITH DIFFERENTIAL/PLATELET
Basophils Absolute: 0 10*3/uL (ref 0.0–0.1)
EOS%: 2.8 % (ref 0.0–7.0)
Eosinophils Absolute: 0.2 10*3/uL (ref 0.0–0.5)
HCT: 43.2 % (ref 34.8–46.6)
HGB: 14.8 g/dL (ref 11.6–15.9)
MCH: 32.9 pg (ref 25.1–34.0)
MONO#: 0.4 10*3/uL (ref 0.1–0.9)
MONO%: 6.6 % (ref 0.0–14.0)
NEUT%: 63.1 % (ref 38.4–76.8)
Platelets: 250 10*3/uL (ref 145–400)
RBC: 4.51 10*6/uL (ref 3.70–5.45)
RDW: 13.3 % (ref 11.2–14.5)
lymph#: 1.7 10*3/uL (ref 0.9–3.3)

## 2009-05-01 ENCOUNTER — Telehealth (INDEPENDENT_AMBULATORY_CARE_PROVIDER_SITE_OTHER): Payer: Self-pay | Admitting: *Deleted

## 2009-05-03 ENCOUNTER — Encounter: Admission: RE | Admit: 2009-05-03 | Discharge: 2009-05-03 | Payer: Self-pay | Admitting: Thoracic Surgery

## 2009-05-03 ENCOUNTER — Ambulatory Visit: Payer: Self-pay | Admitting: Thoracic Surgery

## 2009-05-24 ENCOUNTER — Telehealth (INDEPENDENT_AMBULATORY_CARE_PROVIDER_SITE_OTHER): Payer: Self-pay | Admitting: *Deleted

## 2009-05-25 ENCOUNTER — Ambulatory Visit: Payer: Self-pay | Admitting: Pulmonary Disease

## 2009-06-07 ENCOUNTER — Telehealth (INDEPENDENT_AMBULATORY_CARE_PROVIDER_SITE_OTHER): Payer: Self-pay | Admitting: *Deleted

## 2009-06-09 ENCOUNTER — Telehealth: Payer: Self-pay | Admitting: Pulmonary Disease

## 2009-06-16 ENCOUNTER — Encounter: Payer: Self-pay | Admitting: Pulmonary Disease

## 2009-07-05 ENCOUNTER — Ambulatory Visit: Payer: Self-pay | Admitting: Thoracic Surgery

## 2009-07-05 ENCOUNTER — Encounter: Payer: Self-pay | Admitting: Pulmonary Disease

## 2009-07-05 ENCOUNTER — Encounter: Admission: RE | Admit: 2009-07-05 | Discharge: 2009-07-05 | Payer: Self-pay | Admitting: Thoracic Surgery

## 2009-07-07 ENCOUNTER — Encounter: Payer: Self-pay | Admitting: Pulmonary Disease

## 2009-07-17 ENCOUNTER — Encounter: Admission: RE | Admit: 2009-07-17 | Discharge: 2009-07-17 | Payer: Self-pay | Admitting: Surgery

## 2009-07-26 ENCOUNTER — Encounter: Payer: Self-pay | Admitting: Pulmonary Disease

## 2009-09-14 ENCOUNTER — Telehealth: Payer: Self-pay | Admitting: Pulmonary Disease

## 2009-09-22 ENCOUNTER — Ambulatory Visit: Payer: Self-pay | Admitting: Pulmonary Disease

## 2009-09-25 ENCOUNTER — Ambulatory Visit: Payer: Self-pay | Admitting: Pulmonary Disease

## 2009-10-01 DIAGNOSIS — C3490 Malignant neoplasm of unspecified part of unspecified bronchus or lung: Secondary | ICD-10-CM | POA: Insufficient documentation

## 2009-10-01 LAB — CONVERTED CEMR LAB
Basophils Absolute: 0.1 10*3/uL (ref 0.0–0.1)
Bilirubin, Direct: 0.1 mg/dL (ref 0.0–0.3)
CO2: 28 meq/L (ref 19–32)
Calcium: 9.6 mg/dL (ref 8.4–10.5)
Chloride: 108 meq/L (ref 96–112)
Creatinine, Ser: 0.6 mg/dL (ref 0.4–1.2)
Direct LDL: 156.1 mg/dL
Eosinophils Absolute: 0.2 10*3/uL (ref 0.0–0.7)
HDL: 40.8 mg/dL (ref >39.00)
Hgb A1c MFr Bld: 5.7 % (ref 4.6–6.5)
MCHC: 34.3 g/dL (ref 30.0–36.0)
MCV: 99.7 fL (ref 78.0–100.0)
Monocytes Relative: 7 % (ref 3.0–12.0)
Neutrophils Relative %: 59.4 % (ref 43.0–77.0)
Platelets: 232 10*3/uL (ref 150.0–400.0)
Potassium: 5.2 meq/L — ABNORMAL HIGH (ref 3.5–5.1)
RBC: 4.35 M/uL (ref 3.87–5.11)
RDW: 13.1 % (ref 11.5–14.6)
Sodium: 143 meq/L (ref 135–145)
Total Bilirubin: 0.7 mg/dL (ref 0.3–1.2)
Total CHOL/HDL Ratio: 6
Total Protein: 6.9 g/dL (ref 6.0–8.3)
WBC: 6.9 10*3/uL (ref 4.5–10.5)

## 2009-10-02 ENCOUNTER — Ambulatory Visit: Payer: Self-pay | Admitting: Internal Medicine

## 2009-10-03 ENCOUNTER — Encounter: Payer: Self-pay | Admitting: Pulmonary Disease

## 2009-10-04 ENCOUNTER — Ambulatory Visit (HOSPITAL_COMMUNITY): Admission: RE | Admit: 2009-10-04 | Discharge: 2009-10-04 | Payer: Self-pay | Admitting: Internal Medicine

## 2009-10-05 ENCOUNTER — Telehealth (INDEPENDENT_AMBULATORY_CARE_PROVIDER_SITE_OTHER): Payer: Self-pay | Admitting: *Deleted

## 2009-10-10 ENCOUNTER — Encounter: Payer: Self-pay | Admitting: Pulmonary Disease

## 2009-11-09 ENCOUNTER — Encounter: Payer: Self-pay | Admitting: Pulmonary Disease

## 2009-12-25 ENCOUNTER — Ambulatory Visit: Payer: Self-pay | Admitting: Pulmonary Disease

## 2010-01-01 ENCOUNTER — Telehealth (INDEPENDENT_AMBULATORY_CARE_PROVIDER_SITE_OTHER): Payer: Self-pay | Admitting: *Deleted

## 2010-01-04 ENCOUNTER — Telehealth (INDEPENDENT_AMBULATORY_CARE_PROVIDER_SITE_OTHER): Payer: Self-pay | Admitting: *Deleted

## 2010-01-15 ENCOUNTER — Encounter: Payer: Self-pay | Admitting: Pulmonary Disease

## 2010-01-23 ENCOUNTER — Telehealth (INDEPENDENT_AMBULATORY_CARE_PROVIDER_SITE_OTHER): Payer: Self-pay | Admitting: *Deleted

## 2010-02-12 ENCOUNTER — Encounter: Payer: Self-pay | Admitting: Pulmonary Disease

## 2010-02-24 ENCOUNTER — Encounter: Payer: Self-pay | Admitting: Pulmonary Disease

## 2010-02-24 ENCOUNTER — Other Ambulatory Visit: Payer: Self-pay | Admitting: Internal Medicine

## 2010-02-24 DIAGNOSIS — C3490 Malignant neoplasm of unspecified part of unspecified bronchus or lung: Secondary | ICD-10-CM

## 2010-02-25 ENCOUNTER — Encounter: Payer: Self-pay | Admitting: Thoracic Surgery

## 2010-02-25 ENCOUNTER — Encounter: Payer: Self-pay | Admitting: Pulmonary Disease

## 2010-02-25 ENCOUNTER — Encounter: Payer: Self-pay | Admitting: Internal Medicine

## 2010-03-06 NOTE — Letter (Signed)
Summary: [facility]  [facility]   Imported By: Sherian Rein 07/24/2009 11:01:56  _____________________________________________________________________  External Attachment:    Type:   Image     Comment:   External Document

## 2010-03-06 NOTE — Op Note (Signed)
Summary: [facility]   Imported By: Sherian Rein 07/04/2009 13:51:35  _____________________________________________________________________  External Attachment:    Type:   Image     Comment:   External Document

## 2010-03-06 NOTE — Assessment & Plan Note (Signed)
Summary: allergies nasal congestion/ mbw   CC:  3 month ROV & add-on for "sinus".  History of Present Illness: 77 y/o WF here for a follow up visit... she has mult medical specialists who follow all of her medical problems (see below)...   ~  January 02, 2009:  just had f/u CT Chest by DrMohammed w/ LUL nodule seen & PET CT / Bx planned... she also has appt w/ DrBurney... understandibly concerned, but she was also concerned about her long prob list here & we reviewed these diagnoses in detail... labs done 11/10 and Chol elevated but she didn't continue the Livalo so we gave her some more samples and new Rx written...   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 she had post op AFib & sm hydropneumothorax... disch on Amio & she wants off this med- weaned it on her own to 1/d so far... DrBurney continues to follow her regularly w/ CXR etc... BP meds adjusted in hosp & stable since disch;  Chol is improved on the Livalo;  Sugars are satis on the Metformin one per day...   ~  May 25, 2009:  add-on for cough, congestion, min phlegm, & incr SOB... plus c/o aching all over & she tells me that DrBurney stopped her Livolo last month (?if aching any better since off the statin)... she had follow ups w/ DrBurney & DrMohammed last month> she continues on observation alone...    ~  September 25, 2009:  67mo ROV- c/o cough "at least 2 times per day" & tongue sore... she has had follow ups w/ her specialists:  DrRamos 5/11 for LBP to right leg & given epid steroid shot (improved)...  DrBurney 6/11 CXR stable & CT planned in Aug, she had some dypnea which he related to the amt of lung tissue resected...  DrGerkin 6/11 for bilat thyroid nodules w/ benign bx- f/u sonar w/ multinod goiter, no ch in nodules, TSH= WNL.[name].  she has f/u appt w/ DrMohammed in several weeks...   ~  December 25, 2009:  Add-on appt for "sinus"- c/o right eye problem "it's allergy" & notes red, swollen, right sided facial pain & HA;  notes drainage "it pours" mucus, congestion; hurts in her teeth down to her jaw, but denies fever, discolored phlegm or blood... 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  we discussed checking sinus XRays & treating her w/ Depo/ Pred, Augmentin, Mucinex >> if symptoms persist she will need Neuro f/u for ?atypic facial pain?   Current Problem List:  ASTHMA (ICD-493.90) / COPD (ICD-496) - ex-smoker, quit 1997... on ADVAIR 500Bid & SPIRIVA 1/d + PROVENTIL Prn... she was participating in [facility] (last 3/09) & stopped on her own... may have a superimposed component of restriction due to obesity & prev lung surgeries...  ~  PFT 8/08 showed FVC=2.02 (77%), FEV1=1.07 (51%), %1sec=53, mid-flows=19%pred...  ~  PFT 7/09 today= FVC=1.93 (72%), FEV1=1.04 (49%), %1sec=54, mid-flows=19%pred...  ~  1/11:  s/p LUL resection by DrBurney> multicentric bronchoalveolar cell cancer.  ~  2/11: post hosp- reminded to use the Advair Bid & Spiriva daily; incr exercise program.  ~  4/11:  add-on w/ exac> given Depo80 + dosepak...  Hx of LUNG CANCER (  ICD-162.9) - s/p right upper lobectomy by DrBurney 11/05 for a stage 1A non-small cell lung cancer (adenocarcinoma w/ bronchalveolar cell features)... oncology eval and continued f/u by DrMohammed & DrBurney on observation>>  ~  CT Chest 11/08 showed no recurrence, mult bilat nodules unchanged x3+ yrs, nodular thyroid w/o change...  ~  CXR 7/09 showed stable post-op changes and scarring on the right, NAD.[name].  ~  CTAngio Chest 10/09 showed neg for PE, prom thyroid, atherosclerotic changes in Ao, no change in ground-glass nodules in LUL area...  ~  CT Chest 11/10 by DrMohammed showed new LUL solid nodule, no change in ground-glass  areas... lesion was PET pos...  ~  1/11:  s/p LULobectomy & node dissection via minithoracotomy by DrBurney- path showed 3 foci of well diff bronchoalveolar cell ca & neg nodes... decision made at conference for no chemoRx, EGFR assay was neg...  ~  she continues to have monitoring by DrBurney/ DrMohammed> on observation alone.  HYPERTENSION (ICD-401.9) - on DIOVAN/Hct 160-12.5 Daily, VERAPAMIL SR 240mg /d, & LASIX 20mg  "Prn"... BP= 136/82- feeling OK, tolerating Rx etc... denies CP, palipit, dizziness, syncope, ch in dyspnea, edema, etc...   ~  Cardiac eval 9/09 by Walker Kehr was neg and 2DEcho showed DD w/ norm LVsys function, EF= 60-65%, no regional wall motion abn...  PAROXYSMAL ATRIAL FIBRILLATION (ICD-427.31) - hx PAF in the post op period... converted to NSR & holding... transiently on Amiodarone & she wanted off Rx...  PERIPHERAL VASCULAR DISEASE (ICD-443.9) - she has atherosclerotic changes in her Ao noted on her prev scans...   ~  11/10: pt had mult questions about this problem on the prob list- discussed "hardening of the arteries" in detail.  VENOUS INSUFFICIENCY (ICD-459.81) - she knows to follow a low sodium diet, elevate legs, wear support hose, etc... she insists on keeping Lasix 20mg  on hand for Prn use...  HYPERLIPIDEMIA (ICD-272.4) - prev on Livolo 2mg - 1/2 tab daily (stopped due to aching), +FENOGLIDE 120mg /d, FISH OIL & CoQ10  supplements... prev on Vytorin but INTOL, & she states INTOL to all statins... she was not satis w/ the [facility] in the past.  ~  labs 8/08 off Vytorin showed TChol 183, TG 207, HDL 46, LDL 112... try fenofibrate...  ~  FLP 5/09 on Feno120 showed TChol 188, TG 111, HDL 28, LDL 137... cont same, better diet, get wt down!  ~  FLP 4/10 on Feno120 showed TChol 240, TG 104, HDL 49, LDL 165... I rec f/u [facility], she declined.  ~  FLP 7/10 on Feno120 showed TChol 222, TG 152, HDL 36, LDL 172... rec> try LIVALO 2mg - 1/2 tab Qhs.  ~  FLP 11/10 on  Feno120+FishOil showed TChol 253, TG 125, HDL 42, LDL 208... try LIVALO2mg - 1/2 tab/d & stay on it.  ~  FLP 1/11 on Liv1mg +Feno120 showed TChol 170, TG 101, HDL 45, LDL 105... continue same.  ~  3/11: she reports aching all over & DrBurney stopped the Livolo> contin diet + other meds.  ~  FLP 8/11 showed TChol 225, TG 238, HDL 41, LDL 156... INTOL all statins, she'll do the best she can w/ diet.  DIABETES MELLITUS (ICD-250.00) - started on METFORMIN 500mg Bid 4/10, but decr on her own to 1 daily 1/11... we had stressed the importance of diet- low carb/ low fat and weight reduction, along w/ her pulm rehab exercises...  ~  labs 4/10 showed BS= 131, HgA1c= 7.0.Marland KitchenMarland Kitchen Metformin500Bid started.  ~  labs 7/10 showed BS= 134,  A1c= 6.0  ~  labs 11/10 showed BS= 148, A1c= 6.5  ~  1/11:  she cut the Metformin to 1/d due to nausea.  ~  labs 8/11 showed BS= 137, A1c= 5.7.Marland KitchenMarland Kitchen continue same, get wt down.  NONTOXIC MULTINODULAR GOITER (ICD-241.1) - eval and rx by DrBalan for Endocrinology & DrGerkin for CCS... she is asymptomatic... dominant nodule was needled and benign... surg consult from DrGerkin- elected observation & he checks her yearly...  ~  seen 6/10 by DrGerkin- f/u sonar w/o change in any of the nodules...  ~  labs 7/10 showed TSH= 0.89  ~  seen 6/11 by DrGerkin- f/u sonar w/o change in nodules.  OBESITY (ICD-278.00) - obese w/ abd panniculus & we discussed diet + exercise strategies.[name].  ~  weight up to 205# 11/09- we discussed diet, calorie restriction, exercise, & get the weight down...  ~  weight 4/10 = 198#  ~  weight 7/10 = 194#  ~  weight 11/10 = 196#  ~  weight 2/11 = 184# (post op)  ~  weight 8/11 = 181#  ~  weight 11/11 = 186#... she needs to do better!  GERD (ICD-530.81) - on PRILOSEC 20mg /d...  ~  colonoscopy 7/09 by Rodena Medin showed 4 sm polyps= tubular adenoma on bx... f/u planned 3 yrs...  NEPHROLITHIASIS, HX OF (ICD-V13.01)  Hx of BLADDER CANCER (ICD-188.9) - had hematuria in  2001 & referred to DrPeterson... 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 all subseq cystoscopies have been neg- no recurrence.  ~  she reports f/u w/ DrPeterson yearly & doing satis by her report.  DEGENERATIVE JOINT DISEASE (ICD-715.90) - s/p rotator cuff repair in 1991... s/p left TKR from DrGioffre in 2002...  OSTEOPENIA (ICD-733.90) - on ACTONEL 150/mo, ca++, MVI, etc...  ~  labs 8/11 showed Vit D level = 22... rec> start Vit D supplement extra 02-1998 u daily...  ANXIETY (ICD-300.00) - on XANAX 0.25mg  Prn... she has signif hx of claustrophobia, but denies that she has any anxiety...   Preventive Screening-Counseling & Management  Alcohol-Tobacco     Smoking Status: quit     Year Started: 1956     Year Quit: 1996     Pack years: 3/4 ppd   Allergies: 1)  ! Codeine 2)  ! Epinephrine 3)  ! Morphine 4)  ! Erythromycin  Comments:  Nurse/Medical Assistant: The patient's medications and allergies were reviewed with the patient and were updated in the Medication and Allergy Lists.  Past History:  Past Medical History: ASTHMA (ICD-493.90) COPD (ICD-496) Hx of LUNG CANCER (ICD-162.9) HYPERTENSION (ICD-401.9) PAROXYSMAL ATRIAL FIBRILLATION (ICD-427.31) PERIPHERAL VASCULAR DISEASE (ICD-443.9) VENOUS INSUFFICIENCY (ICD-459.81) HYPERLIPIDEMIA (ICD-272.4) DIABETES MELLITUS (ICD-250.00) NONTOXIC MULTINODULAR GOITER (ICD-241.1) OBESITY (ICD-278.00) GERD (ICD-530.81) NEPHROLITHIASIS, HX OF (ICD-V13.01) Hx of BLADDER CANCER (ICD-188.9) DEGENERATIVE JOINT DISEASE (ICD-715.90) OSTEOPENIA (ICD-733.90) ANXIETY (ICD-300.00)  Past Surgical History: S/P right rotator cuff repair by DrPresson 1991 S/P TURBT 10/01by DrPeterson S/P left TKR by DrGioffre 2002 S/P right upper lobectomy for lung cancer 11/05 by DrBurney (bronchoalveolar cell ca) S/P left upper lobectomy & node dissection 1/11 by DrBurney (multicentric  bronchoalveolar cell ca) S/P D&C for removal of endometrial polyp (benign) 12/10 by GYN  Family History: Reviewed history from 06/01/2008 and no changes required. mother died 79 with copd heat failure father died 27 mi and ulcers 5 Siblings: 1 brother died from cancer, allergies 1 brother died at 68 with cancer, heart disease, asthma 1 sister has emphysema,  asthma 1 sister has heart disease, allergies 1 sister has rheumatism, allergies  Social History: Reviewed history from 09/25/2009 and no changes required. Married, husb= Carlyne Keehan 1 child ex-smoker, quit 1999 social alcohol w/ wine daily retired  Review of Systems      See HPI       The patient complains of hoarseness, dyspnea on exertion, headaches, muscle weakness, and difficulty walking.  The patient denies anorexia, fever, weight loss, weight gain, vision loss, decreased hearing, chest pain, syncope, peripheral edema, prolonged cough, hemoptysis, abdominal pain, melena, hematochezia, severe indigestion/heartburn, hematuria, incontinence, suspicious skin lesions, transient blindness, depression, unusual weight change, abnormal bleeding, enlarged lymph nodes, and angioedema.    Vital Signs:  Patient profile:   76 year old female Height:      62 inches Weight:      185.50 pounds BMI:     34.05 O2 Sat:      96 % on Room air Temp:     97.6 degrees F oral Pulse rate:   98 / minute BP sitting:   136 / 82  (right arm) Cuff size:   regular  Vitals Entered By: Randell Loop CMA (December 25, 2009 11:16 AM)  O2 Sat at Rest %:  96 O2 Flow:  Room air CC: 3 month ROV & add-on for "sinus" Is Patient Diabetic? Yes Pain Assessment Patient in pain? yes      Onset of pain  head pain--sinus pressure on the right side Comments MEDS UPDATED TODAY WITH PT   Physical Exam  Additional Exam:  WD, WN, 76 y/o  WF in NAD... GENERAL:  Alert & oriented; pleasant & cooperative... HEENT:  [place]/AT, EOM-wnl, PERRLA, EACs-clear,  TMs-wnl, NOSE-clear, THROAT-clear & wnl. NECK:  Supple w/ fairROM; no JVD; normal carotid impulses w/o bruits; no thyromegaly or nodules palpated; no lymphadenopathy. CHEST:  Decr BS bilat; scat wheezing/ rhonchi bilat, no rales or consolidation; s/p VATS surg scar on right & mini thoracotomy scar on left... sl tender to palp left chest wall... HEART:  Regular Rhythm; without murmurs/ rubs/ or gallops heard... ABDOMEN:  Obese w/ panniculus, soft & nontender; normal bowel sounds; no organomegaly or masses detected. EXT:  S/P left TKR; mild arthritic changes; no varicose veins/ +venous insuffic/ tr edema. NEURO:  CN's intact;  no focal neuro deficits... sl tender over right max sinus. DERM:  No lesions noted; no rash etc...    X-ray  Procedure date:  01/24/2010  Findings:      PARANASAL SINUSES - 1-2 VIEW Comparison: None.   Findings: Imaged paranasal sinuses are clear.  No focal bony abnormality.   IMPRESSION: Negative exam.   Read By:  Charyl Dancer,  M.D.    Impression & Recommendations:  Problem # 1:  ATYPICAL FACE PAIN (ICD-350.2) The sinus films are neg> she thinks it's "sinus" & we will Rx w/ Depo/ Pred, Augmentin & Mucinex... but I am concerned about her recurrent ?21yr cycle of facial pain? 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 Orders: T-Sinuses 1-2 Views [phone]) Neurology Referral (Neuro)  Problem # 2:  COPD (ICD-496) She is admonished not to abuse Prednisone due to potential long term use side effects... Her updated medication list for this problem includes:    Advair Diskus 500-50 Mcg/dose Aepb (Fluticasone-salmeterol) ..... Use one puff two times a day    Spiriva Handihaler 18 Mcg Caps (Tiotropium bromide monohydrate) ..... Inhale  one capsule by mouth every day    Proventil Hfa 108 (90 Base) Mcg/act Aers (Albuterol sulfate) .[name]... 1-2 sprays up to every 4h as needed  for wheezing...  Problem # 3:  Hx of LUNG CANCER (ICD-162.9) Followed by DrBurney & DrMohammed as noted...  Problem # 4:  HYPERTENSION (ICD-401.9) Controlled on meds... Her updated medication list for this problem includes:    Verapamil Hcl Cr 240 Mg Xr24h-cap (Verapamil hcl) .[name]... Take 1 cap by mouth once daily...    Diovan Hct 160-12.5 Mg Tabs (Valsartan-hydrochlorothiazide) .[name]... Take 1 tablet by mouth once a day    Furosemide 20 Mg Tabs (Furosemide) .[name]... Take 1 tab by mouth once daily as needed for swelling...  Problem # 5:  HYPERLIPIDEMIA (ICD-272.4) She refuses statin therapy... Her updated medication list for this problem includes:    Fenoglide 120 Mg Tabs (Fenofibrate) .[name]... Take 1 tab by mouth once daily...  Problem # 6:  DIABETES MELLITUS (ICD-250.00) Control has been adeq w/ Metformin & diet... Her updated medication list for this problem includes:    Adult Aspirin Low Strength 81 Mg Tbdp (Aspirin) .[name]... Take 1 tablet by mouth once a day    Diovan Hct 160-12.5 Mg Tabs (Valsartan-hydrochlorothiazide) .[name]... Take 1 tablet by mouth once a day    Metformin Hcl 500 Mg Tabs (Metformin hcl) .[name]... Take 1 tablet by mouthin the am...  Problem # 7:  NONTOXIC MULTINODULAR GOITER (ICD-241.1) Followed by DrBalan & DrGerkin...  Problem # 8:  OBESITY (ICD-278.00) She desperately needs to lose weight to help her metabolic situation & her breathing...  Problem # 9:  Hx of BLADDER CANCER (ICD-188.9) Followed by DrPeterson for Urology...  Problem # 10:  OTHER MEDICAL PROBLEMS AS NOTED>>>  Complete Medication List: 1)  Advair Diskus 500-50 Mcg/dose Aepb (Fluticasone-salmeterol) .... Use one puff two times a day 2)  Spiriva Handihaler 18 Mcg Caps (Tiotropium bromide monohydrate) .... Inhale one capsule by mouth every day 3)  Proventil Hfa 108 (90 Base) Mcg/act Aers (Albuterol sulfate) .[name].. 1-2 sprays up to every 4h as needed for wheezing... 4)  Adult Aspirin Low Strength 81 Mg Tbdp  (Aspirin) .... Take 1 tablet by mouth once a day 5)  Verapamil Hcl Cr 240 Mg Xr24h-cap (Verapamil hcl) .... Take 1 cap by mouth once daily.Marland KitchenMarland Kitchen 6)  Diovan Hct 160-12.5 Mg Tabs (Valsartan-hydrochlorothiazide) .... Take 1 tablet by mouth once a day 7)  Furosemide 20 Mg Tabs (Furosemide) .... Take 1 tab by mouth once daily as needed for swelling... 8)  Fenoglide 120 Mg Tabs (Fenofibrate) .... Take 1 tab by mouth once daily.Marland KitchenMarland Kitchen 9)  Fish Oil Maximum Strength 1200 Mg Caps (Omega-3 fatty acids) .... Take 1 cap by mouth once daily... 10)  Coq10 100 Mg Caps (Coenzyme q10) .... Take 1 tablet by mouth once a day 11)  Metformin Hcl 500 Mg Tabs (Metformin hcl) .... Take 1 tablet by mouthin the am... 12)  Actonel 150 Mg Tabs (Risedronate sodium) .... Take 1 tab by mouth each month... 13)  Caltrate 600+d 600-400 Mg-unit Tabs (Calcium carbonate-vitamin d) .... Take 1 tablet by mouth once a day 14)  Multivitamins Tabs (Multiple vitamin) .... Take 1 tablet by mouth once a day 15)  Vitamin D3 2000 Unit Caps (Cholecalciferol) .... Take 1 cap by mouth once daily... 16)  Tylenol Arthritis Pain 650 Mg Cr-tabs (Acetaminophen) .... Take 2 tablets by mouth once daily 17)  Diphenhydramine Hcl 25 Mg Tabs (Diphenhydramine hcl) .... As needed for allergies 18)  Augmentin  875-125 Mg Tabs (Amoxicillin-pot clavulanate) .... Take 1 tab by mouth two times a day til gone... 19)  Prednisone 20 Mg Tabs (Prednisone) .... Take 1/2 to 1 tab daily as needed  Other Orders: Depo- Medrol 80mg  (J1040) Depo- Medrol 40mg  (J1030) Admin of Therapeutic Inj  intramuscular or subcutaneous (16109)  Patient Instructions: 1)  Today we updated your med list- see below.... 2)  For your Sinus/ Facial symptoms:  take the Augmentin twice daily til gone;  start on the [facility]- 2tabs twice daily w/ plenty of water;  spray a Saline nasal spray every 1-2 H as needed;  we gave you a Depo shot & a tapering course of oral Prednisone (follow directions on  bottle).Marland KitchenMarland Kitchen 3)  We also checked your sinus XRay> please call the "phone tree" for your results.Marland KitchenMarland Kitchen  4)  If the problem persists then we will need to pursue the ENT/ Neurology evaluations as in the past... Prescriptions: PREDNISONE 20 MG TABS (PREDNISONE) take 1 tab by mouth two times a day x3d, then 1 tab daily x3d, then 1/2 tab daily til gone...  #12 x 0   Entered and Authorized by:   Michele Mcalpine MD   Signed by:   Michele Mcalpine MD on 12/25/2009   Method used:   Print then Give to Patient   RxID:   [id number] AUGMENTIN 875-125 MG TABS (AMOXICILLIN-POT CLAVULANATE) take 1 tab by mouth two times a day til gone...  #20 x 0   Entered and Authorized by:   Michele Mcalpine MD   Signed by:   Michele Mcalpine MD on 12/25/2009   Method used:   Print then Give to Patient   RxID:   [id number] PROVENTIL HFA 108 (90 BASE) MCG/ACT AERS (ALBUTEROL SULFATE) 1-2 sprays up to every 4H as needed for wheezing...  #1 x 12   Entered and Authorized by:   Michele Mcalpine MD   Signed by:   Michele Mcalpine MD on 12/25/2009   Method used:   Print then Give to Patient   RxID:   [id number]    Immunization History:  Influenza Immunization History:    Influenza:  historical (10/17/2009)    Medication Administration  Injection # 1:    Medication: Depo- Medrol 80mg     Diagnosis: SINUSITIS, ACUTE NEC (ICD-461.8)    Route: IM    Site: RUOQ gluteus    Exp Date: 06/2012    Lot #: [id number]    Mfr: [employer]    Patient tolerated injection without complications    Given by: Randell Loop CMA (December 25, 2009 12:15 PM)  Injection # 2:    Medication: Depo- Medrol 40mg     Diagnosis: SINUSITIS, ACUTE NEC (ICD-461.8)    Route: IM    Site: RUOQ gluteus    Exp Date: 06/2012    Lot #: [id number]    Mfr: [employer]    Patient tolerated injection without complications    Given by: Randell Loop CMA (December 25, 2009 12:15 PM)  Orders Added: 1)  Est. Patient Level IV [25366] 2)  Depo- Medrol 80mg  [J1040] 3)   Depo- Medrol 40mg  [J1030] 4)  Admin of Therapeutic Inj  intramuscular or subcutaneous [96372] 5)  T-Sinuses 1-2 Views [70210TC] 6)  Neurology Referral [Neuro]

## 2010-03-06 NOTE — Assessment & Plan Note (Signed)
Summary: FOLLOW UP/REQUESTING DEPO/HERE AT 8:45/LA   CC:  Add-on OV for incr SOB and aching/ sore....  History of Present Illness: 76 y/o WF here for a follow up visit... she has mult medical specialists who follow all of her medical problems (see below)...   ~  January 02, 2009:  just had f/u CT Chest by DrMohammed w/ LUL nodule seen & PET CT + Bx planned... she also has appt w/ DrBurney... understandibly concerned, but she was also concerned about her long prob list here & we reviewed these diagnoses in detail... labs done 11/10 and Chol elevated but she didn't continue the Livalo so we gave her some more samples and new Rx written...   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 she had post op AFib & sm hydropneumothorax... disch on Amio & she wants off this med- weaned it on her own to 1/d so far... DrBurney continues to follow her regularly w/ CXR etc... BP meds adjusted in hosp & stable since disch;  Chol is improved on the Livalo;  Sugars are satis on the Metformin one per day...   ~  May 25, 2009:  add-on for cough, congestion, min phlegm, & incr SOB... plus c/o aching all over & she tells me that DrBurney stopped her Livolo last month (?if aching any better since off the statin)... she had follow ups w/ DrBurney & DrMohammed last month> she continues on observation alone...     Current Problem List:  ASTHMA (ICD-493.90) / COPD (ICD-496) - ex-smoker, quit 1997... on ADVAIR 500Bid & SPIRIVA 1/d + PROAIR Prn... she was participating in [facility] at [facility] (last 3/09) & stopped on her own... may have a superimposed component of restriction due to obesity & prev lung surgeries...  ~  PFT 8/08 showed FVC=2.02 (77%), FEV1=1.07 (51%), %1sec=53, mid-flows=19%pred...  ~  PFT 7/09 today= FVC=1.93 (72%), FEV1=1.04 (49%), %1sec=54, mid-flows=19%pred...  ~  2/11:  post hosp- reminded to use the Advair Bid & Spiriva daily; incr exercise program.  ~  4/11:  add-on w/ exac> given Depo80 + dosepak...  Hx of LUNG CANCER, UPPER LOBE (ICD-162.3) - s/p right upper lobectomy by DrBurney 11/05 for a stage 1A non-small cell lung cancer (adenocarcinoma w/ bronchalveolar cell features)... oncology eval and continued f/u by DrMohammed & DrBurney on observation>>  ~  CT Chest 11/08 showed no recurrence, mult bilat nodules unchanged x3+ yrs, nodular thyroid w/o change...  ~  CXR 7/09 showed stable post-op changes and scarring on the right, NAD.[name].  ~  CTAngio Chest 10/09 showed neg for PE, prom thyroid, atherosclerotic changes in Ao, no change in ground-glass nodules in LUL area...  ~  CT Chest 11/10 by DrMohammed showed new LUL solid nodule, no change in ground-glass areas... lesion was PET pos...  ~  1/11:  s/p LULobectomy & node dissection via minithoracotomy by DrBurney- path showed 3 foci of well diff bronchoalveolar cell ca & neg nodes... decision made at conference for no chemoRx, EGFR assay was neg...  ~  she continues to have monitoring by drBurney/ DrMohammed> on observation alone.  HYPERTENSION (ICD-401.9) - on DIOVAN/Hct 160-12.5 Daily, VERAPAMIL SR 240mg /d (restarted on her own) & LASIX 20mg  "Prn"... BP= 150/90- feeling OK, tolerating Rx etc... denies HA, fatigue, visual changes, CP, palipit, dizziness, syncope, dyspnea, edema, etc...   ~  Cardiac eval 9/09 by Walker Kehr was  neg and 2DEcho showed DD w/ norm LVsys function, EF= 60-65%, no regional wall motion abn...  PAROXYSMAL ATRIAL FIBRILLATION (ICD-427.31) - hx PAF in the post op period... converted to NSR & holding... transiently on Amiodarone & she wanted off Rx...  PERIPHERAL VASCULAR DISEASE (ICD-443.9) - she has atherosclerotic changes in her Ao noted on her prev scans...   ~  11/10: pt had mult questions about this problem on the prob list- discussed in detail.  VENOUS INSUFFICIENCY (ICD-459.81) - she  knows to follow a low sodium diet, elevate legs, wear support hose, etc... she insists on keeping Lasix 20mg  on hand for Prn use...  HYPERLIPIDEMIA (ICD-272.4) - prev on Livolo 2mg - 1/2 tab daily (stopped due to aching), +FENOGLIDE 120mg /d, & FISH OIL supplement... prev on Vytorin but INTOL, & she states INTOL to all statins... she was not satis by the Lipid Clinic in the past.  ~  labs 8/08 off Vytorin showed TChol 183, TG 207, HDL 46, LDL 112... try fenofibrate...  ~  FLP 5/09 on Feno120 showed TChol 188, TG 111, HDL 28, LDL 137... cont same, better diet, get wt down!  ~  FLP 4/10 on Feno120 showed TChol 240, TG 104, HDL 49, LDL 165... I rec f/u Lipid Clinic, she declined.  ~  FLP 7/10 on Feno120 showed TChol 222, TG 152, HDL 36, LDL 172... rec> try LIVALO 2mg - 1/2 tab Qhs.  ~  FLP 11/10 on Feno120+FishOil showed TChol 253, TG 125, HDL 42, LDL 208... try LIVALO2mg - 1/2 tab/d & stay on it.  ~  FLP 1/11 on Liv1mg +Feno120 showed TChol 170, TG 101, HDL 45, LDL 105... continue same.  ~  3/11: she reports aching all over & DrBurney stopped the Livolo...  DIABETES MELLITUS (ICD-250.00) - started on METFORMIN 500mg Bid 4/10, but decr on her own to 1 daily 1/11... we had stressed the importance of diet- low carb/ low fat and weight reduction, along w/ her pulm rehab exercises...  ~  labs 4/10 showed BS= 131, HgA1c= 7.0.Marland KitchenMarland Kitchen Metformin500Bid started.  ~  labs 7/10 showed BS= 134, A1c= 6.0  ~  labs 11/10 showed BS= 148, A1c= 6.5  ~  1/11:  she cut the Metformin to 1/d due to nausea.  NONTOXIC MULTINODULAR GOITER (ICD-241.1) - eval and rx by DrBalan for endocrinology... she is asymptomatic... dominant nodule was needled and benign... surg consult from DrGerkin- elected observation & he checks her yearly...  ~  seen 6/10 by DrGerkin- f/u sonar w/o change in any of the nodules...  ~  labs 7/10 showed TSH= 0.89  OBESITY (ICD-278.00) - obese w/ abd panniculus & we discussed diet + exercise strategies.[name].  ~   weight up to 205# 11/09- we discussed diet, calorie restriction, exercise, & get the weight down...  ~  weight 4/10 = 198#  ~  weight 7/10 = 194#  ~  weight 11/10 = 196#  ~  weight 2/11 = 184# (post op)  GERD (ICD-530.81) - on PRILOSEC 20mg /d...  ~  colonoscopy 7/09 by Rodena Medin showed 4 sm polyps= tubular adenoma on bx... f/u planned 3 yrs...  NEPHROLITHIASIS, HX OF (ICD-V13.01)  Hx of BLADDER CANCER (ICD-188.9) - had hematuria in 2001 & referred to DrPeterson... 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 all subseq cystoscopies have been neg- no recurrence.  ~  she reports f/u w/ DrPeterson yearly & doing satis by her report.  DEGENERATIVE JOINT DISEASE (ICD-715.90) - s/p rotator cuff repair in  1991... s/p left TKR from DrGioffre in 2002...  OSTEOPENIA (ICD-733.90) - on ACTONEL 150/mo, ca++, MVI, etc...  ANXIETY (ICD-300.00) - on XANAX 0.25mg  Prn... she has signif hx of claustrophobia, but denies that she has any anxiety...   Allergies: 1)  ! Codeine 2)  ! Epinephrine 3)  ! Morphine 4)  ! Erythromycin  Comments:  Nurse/Medical Assistant: The patient's medications and allergies were reviewed with the patient and were updated in the Medication and Allergy Lists.  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) OBESITY (ICD-278.00) GERD (ICD-530.81) NEPHROLITHIASIS, HX OF (ICD-V13.01) Hx of BLADDER CANCER (ICD-188.9) DEGENERATIVE JOINT DISEASE (ICD-715.90) OSTEOPENIA (ICD-733.90) ANXIETY (ICD-300.00)  Past Surgical History:  S/P right rotator cuff repair by DrPresson 1991 S/P TURBT 10/01by  DrPeterson S/P left TKR by DrGioffre 2002 S/P right upper lobectomy for lung cancer 11/05 by DrBurney (bronchoalveolar cell ca) S/P left upper lobectomy & node dissection 1/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) S/P D&C for removal of endometrial polyp (benign) 12/10 by GYN  Family History: Reviewed history from 06/01/2008 and no changes required. mother died 42 with copd heat failure father died 27 mi and ulcers 5 Siblings: 1 brother died from cancer, allergies 1 brother died at 58 with cancer, heart disease, asthma 1 sister has emphysema, asthma 1 sister has heart disease, allergies 1 sister has rheumatism, allergies  Social History: Reviewed history from 06/01/2008 and no changes required. Married, husb= Tanicka Bisaillon 1 child ex-smoker, quit 1999 social alcohol w/ wine daily retired  Review of Systems      See HPI       The patient complains of dyspnea on exertion and prolonged cough.  The patient denies anorexia, fever, weight loss, weight gain, vision loss, decreased hearing, hoarseness, chest pain, syncope, peripheral edema, headaches, hemoptysis, abdominal pain, melena, hematochezia, severe indigestion/heartburn, hematuria, incontinence, muscle weakness, suspicious skin lesions, transient blindness, difficulty walking, depression, unusual weight change, abnormal bleeding, enlarged lymph nodes, and angioedema.    Vital Signs:  Patient profile:   76 year old female Height:      62 inches Weight:      187.13 pounds BMI:     34.35 O2 Sat:      96 % on Room air Temp:     98.0 degrees F oral Pulse rate:   100 / minute BP sitting:   150 / 90  (right arm) Cuff size:   regular  Vitals Entered By: Randell Loop CMA (May 25, 2009 8:41 AM)  O2 Sat at Rest %:  96 O2 Flow:  Room air CC: Add-on OV for incr SOB, aching/ sore... Is Patient Diabetic? Yes Pain Assessment Patient in pain? yes      Comments meds updated today   Physical Exam  Additional  Exam:  WD, WN, 77 y/o  WF in NAD... GENERAL:  Alert & oriented; pleasant & cooperative... HEENT:  [place]/AT, EOM-wnl, PERRLA, EACs-clear, TMs-wnl, NOSE-clear, THROAT-clear & wnl. NECK:  Supple w/ fairROM; no JVD; normal carotid impulses w/o bruits; no thyromegaly or nodules palpated; no lymphadenopathy. CHEST:  Decr BS bilat; scat wheezing/ rhonchi bilat, no rales or consolidation; s/p VATS surg scar on right & mini thoracotomy scar on left... sl tender to palp left chest wall... HEART:  Regular Rhythm; without murmurs/ rubs/ or gallops heard... ABDOMEN:  Obese w/ panniculus,  soft & nontender; normal bowel sounds; no organomegaly or masses detected. EXT:  S/P left TKR; mild arthritic changes; no varicose veins/ +venous insuffic/ tr edema. NEURO:  CN's intact;  no focal neuro deficits... DERM:  No lesions noted; no rash etc...     Impression & Recommendations:  Problem # 1:  DYSPNEA (ICD-786.05) She has COPD & asthmatic component & component of restriction from 2 prev surgeries.... we discussed Rx w/ Depo & Dosepak...  Problem # 2:  Hx of LUNG CANCER, UPPER LOBE (ICD-162.3) Followed by DrBurney & Mohammmed...  Problem # 3:  HYPERTENSION (ICD-401.9) Controlled-- same meds. Her updated medication list for this problem includes:    Verapamil Hcl Cr 240 Mg Xr24h-cap (Verapamil hcl) .[name]... Take 1 cap by mouth once daily...    Diovan Hct 160-12.5 Mg Tabs (Valsartan-hydrochlorothiazide) .[name]... Take 1 tablet by mouth once a day    Furosemide 20 Mg Tabs (Furosemide) .[name]... Take 1 tab by mouth once daily as needed for swelling...  Problem # 4:  VENOUS INSUFFICIENCY (ICD-459.81) She knows to elim sodium, elevate legs, use TED's...  Problem # 5:  HYPERLIPIDEMIA (ICD-272.4) She is off the Livolo & will have to just rely on diet + her other meds... offered lipid clinic but she declines... The following medications were removed from the medication list:    Livalo 2 Mg Tabs (Pitavastatin calcium) .[name]...  Take 1/2 tab by mouth at bedtime... Her updated medication list for this problem includes:    Fenoglide 120 Mg Tabs (Fenofibrate) .[name]... Take 1 tab by mouth once daily...  Problem # 6:  DIABETES MELLITUS (ICD-250.00) Stable- continue same med. Her updated medication list for this problem includes:    Adult Aspirin Low Strength 81 Mg Tbdp (Aspirin) .[name]... Take 1 tablet by mouth once a day    Diovan Hct 160-12.5 Mg Tabs (Valsartan-hydrochlorothiazide) .[name]... Take 1 tablet by mouth once a day    Metformin Hcl 500 Mg Tabs (Metformin hcl) .[name]... Take 1 tablet by mouthin the am...  Problem # 7:  OBESITY (ICD-278.00) REC diet + incr exercise program...  Problem # 8:  OTHER MEDICAL PROBLEMS AS NOTED>>>  Complete Medication List: 1)  Advair Diskus 500-50 Mcg/dose Aepb (Fluticasone-salmeterol) .... Use one puff two times a day 2)  Spiriva Handihaler 18 Mcg Caps (Tiotropium bromide monohydrate) .... Inhale one capsule by mouth every day 3)  Ventolin Hfa 108 (90 Base) Mcg/act Aers (Albuterol sulfate) .... 2 puffs every 4 hours as needed for shortness of breath 4)  Adult Aspirin Low Strength 81 Mg Tbdp (Aspirin) .... Take 1 tablet by mouth once a day 5)  Verapamil Hcl Cr 240 Mg Xr24h-cap (Verapamil hcl) .... Take 1 cap by mouth once daily.Marland KitchenMarland Kitchen 6)  Diovan Hct 160-12.5 Mg Tabs (Valsartan-hydrochlorothiazide) .... Take 1 tablet by mouth once a day 7)  Furosemide 20 Mg Tabs (Furosemide) .... Take 1 tab by mouth once daily as needed for swelling... 8)  Fenoglide 120 Mg Tabs (Fenofibrate) .... Take 1 tab by mouth once daily.Marland KitchenMarland Kitchen 9)  Fish Oil Maximum Strength 1200 Mg Caps (Omega-3 fatty acids) .... Take 1 cap by mouth once daily... 10)  Metformin Hcl 500 Mg Tabs (Metformin hcl) .... Take 1 tablet by mouthin the am... 11)  Prilosec 20 Mg Cpdr (Omeprazole) .... Take 1 tab by mouth once daily (30 min before the 1st meal of the day) 12)  Actonel 150 Mg Tabs (Risedronate sodium) .... Take 1 tab by mouth each  month... 13)  Caltrate 600+d 600-400 Mg-unit  Tabs (Calcium carbonate-vitamin d) .... Take 1 tablet by mouth once a day 14)  Multivitamins Tabs (Multiple vitamin) .... Take 1 tablet by mouth once a day 15)  Xanax 0.25 Mg Tabs (Alprazolam) .[name].. 1 by mouth three times a day as needed for anxiety 16)  Medrol (pak) 4 Mg Tabs (Methylprednisolone) .... Take as directed til gone...  Other Orders: Prescription Created Electronically [id number]) Depo- Medrol 80mg  (J1040) Admin of Therapeutic Inj  intramuscular or subcutaneous (40981)  Patient Instructions: 1)  Today we updated your med list- see below.... 2)  Todat we gave you a Depo shot & wrote a new perscription for a MEDROL Dosepak- take as directed... 3)  Continue your other breathing meds the same... 4)  Let's plan a follow up visit in 4 months w/ FASTING blood work at that time... Prescriptions: MEDROL (PAK) 4 MG TABS (METHYLPREDNISOLONE) take as directed til gone...  #1 pack x 0   Entered and Authorized by:   Michele Mcalpine MD   Signed by:   Michele Mcalpine MD on 05/25/2009   Method used:   Print then Give to Patient   RxID:   [id number]    Immunization History:  Influenza Immunization History:    Influenza:  historical (11/15/2008)    Medication Administration  Injection # 1:    Medication: Depo- Medrol 80mg     Diagnosis: COPD (ICD-496)    Route: IM    Site: LUOQ gluteus    Exp Date: 12/2011    Lot #: [id number]    Mfr: [employer]    Patient tolerated injection without complications    Given by: Randell Loop CMA (May 25, 2009 9:37 AM)  Orders Added: 1)  Prescription Created Electronically [[id number]] 2)  Est. Patient Level IV [57846] 3)  Depo- Medrol 80mg  [J1040] 4)  Admin of Therapeutic Inj  intramuscular or subcutaneous [96295]

## 2010-03-06 NOTE — Letter (Signed)
Summary: [facility]  [facility]   Imported By: Lester [place] 04/14/2009 09:51:35  _____________________________________________________________________  External Attachment:    Type:   Image     Comment:   External Document

## 2010-03-06 NOTE — Assessment & Plan Note (Signed)
Summary: 4 months?apc   CC:  4 month ROV & review of mult medical problems....  History of Present Illness: 76 y/o WF here for a follow up visit... she has mult medical specialists who follow all of her medical problems (see below)...   ~  January 02, 2009:  just had f/u CT Chest by DrMohammed w/ LUL nodule seen & PET CT + Bx planned... she also has appt w/ DrBurney... understandibly concerned, but she was also concerned about her long prob list here & we reviewed these diagnoses in detail... labs done 11/10 and Chol elevated but she didn't continue the Livalo so we gave her some more samples and new Rx written...   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 she had post op AFib & sm hydropneumothorax... disch on Amio & she wants off this med- weaned it on her own to 1/d so far... DrBurney continues to follow her regularly w/ CXR etc... BP meds adjusted in hosp & stable since disch;  Chol is improved on the Livalo;  Sugars are satis on the Metformin one per day...   ~  May 25, 2009:  add-on for cough, congestion, min phlegm, & incr SOB... plus c/o aching all over & she tells me that DrBurney stopped her Livolo last month (?if aching any better since off the statin)... she had follow ups w/ DrBurney & DrMohammed last month> she continues on observation alone...    ~  September 25, 2009:  84mo ROV- c/o cough "at least 2 times per day" & tongue sore... she has had follow ups w/ her specialists:  DrRamos 5/11 for LBP to right leg & given epid steroid shot (improved)...  DrBurney 6/11 CXR stable & CT planned in Aug, she had some dypnea which he related to the amt of lung tissue resected...  DrGerkin 6/11 for bilat thyroid nodules w/ benign bx- f/u sonar w/ multinod goiter, no ch in nodules, TSH= WNL.[name].  she has f/u appt w/ DrMohammed in several weeks...   Current  Problem List:  ASTHMA (ICD-493.90) / COPD (ICD-496) - ex-smoker, quit 1997... on ADVAIR 500Bid & SPIRIVA 1/d + PROVENTIL Prn... she was participating in [facility] (last 3/09) & stopped on her own... may have a superimposed component of restriction due to obesity & prev lung surgeries...  ~  PFT 8/08 showed FVC=2.02 (77%), FEV1=1.07 (51%), %1sec=53, mid-flows=19%pred...  ~  PFT 7/09 today= FVC=1.93 (72%), FEV1=1.04 (49%), %1sec=54, mid-flows=19%pred...  ~  2/11: post hosp- reminded to use the Advair Bid & Spiriva daily; incr exercise program.  ~  4/11:  add-on w/ exac> given Depo80 + dosepak...  Hx of LUNG CANCER (ICD-162.9) - s/p right upper lobectomy by DrBurney 11/05 for a stage 1A non-small cell lung cancer (adenocarcinoma w/ bronchalveolar cell features)... oncology eval and continued f/u by DrMohammed & DrBurney on observation>>  ~  CT Chest 11/08 showed no recurrence, mult bilat nodules unchanged x3+ yrs, nodular thyroid w/o change...  ~  CXR 7/09 showed stable post-op changes and scarring on the right, NAD.[name].  ~  CTAngio Chest 10/09 showed neg for PE, prom thyroid, atherosclerotic changes in Ao, no change in ground-glass nodules in LUL area...  ~  CT Chest 11/10 by DrMohammed showed new LUL solid nodule, no change in ground-glass areas... lesion was PET pos...  ~  1/11:  s/p LULobectomy &  node dissection via minithoracotomy by DrBurney- path showed 3 foci of well diff bronchoalveolar cell ca & neg nodes... decision made at conference for no chemoRx, EGFR assay was neg...  ~  she continues to have monitoring by DrBurney/ DrMohammed> on observation alone.  HYPERTENSION (ICD-401.9) - on DIOVAN/Hct 160-12.5 Daily, VERAPAMIL SR 240mg /d (restarted on her own) & LASIX 20mg  "Prn"... BP= 150/90- feeling OK, tolerating Rx etc... denies HA, fatigue, visual changes, CP, palipit, dizziness, syncope, dyspnea, edema, etc...   ~  Cardiac eval 9/09 by Walker Kehr was neg and 2DEcho showed DD w/ norm  LVsys function, EF= 60-65%, no regional wall motion abn...  PAROXYSMAL ATRIAL FIBRILLATION (ICD-427.31) - hx PAF in the post op period... converted to NSR & holding... transiently on Amiodarone & she wanted off Rx...  PERIPHERAL VASCULAR DISEASE (ICD-443.9) - she has atherosclerotic changes in her Ao noted on her prev scans...   ~  11/10: pt had mult questions about this problem on the prob list- discussed in detail.  VENOUS INSUFFICIENCY (ICD-459.81) - she knows to follow a low sodium diet, elevate legs, wear support hose, etc... she insists on keeping Lasix 20mg  on hand for Prn use...  HYPERLIPIDEMIA (ICD-272.4) - prev on Livolo 2mg - 1/2 tab daily (stopped due to aching), +FENOGLIDE 120mg /d, FISH OIL & CoQ10  supplements... prev on Vytorin but INTOL, & she states INTOL to all statins... she was not satis w/ the Lipid Clinic in the past.  ~  labs 8/08 off Vytorin showed TChol 183, TG 207, HDL 46, LDL 112... try fenofibrate...  ~  FLP 5/09 on Feno120 showed TChol 188, TG 111, HDL 28, LDL 137... cont same, better diet, get wt down!  ~  FLP 4/10 on Feno120 showed TChol 240, TG 104, HDL 49, LDL 165... I rec f/u Lipid Clinic, she declined.  ~  FLP 7/10 on Feno120 showed TChol 222, TG 152, HDL 36, LDL 172... rec> try LIVALO 2mg - 1/2 tab Qhs.  ~  FLP 11/10 on Feno120+FishOil showed TChol 253, TG 125, HDL 42, LDL 208... try LIVALO2mg - 1/2 tab/d & stay on it.  ~  FLP 1/11 on Liv1mg +Feno120 showed TChol 170, TG 101, HDL 45, LDL 105... continue same.  ~  3/11: she reports aching all over & DrBurney stopped the Livolo> contin diet + other meds.  ~  FLP 8/11 showed TChol 225, TG 238, HDL 41, LDL 156... INTOL all statins, do the best she can w/ diet.  DIABETES MELLITUS (ICD-250.00) - started on METFORMIN 500mg Bid 4/10, but decr on her own to 1 daily 1/11... we had stressed the importance of diet- low carb/ low fat and weight reduction, along w/ her pulm rehab exercises...  ~  labs 4/10 showed BS= 131, HgA1c=  7.0.Marland KitchenMarland Kitchen Metformin500Bid started.  ~  labs 7/10 showed BS= 134, A1c= 6.0  ~  labs 11/10 showed BS= 148, A1c= 6.5  ~  1/11:  she cut the Metformin to 1/d due to nausea.  ~  labs 8/11 showed BS= 137, A1c= 5.7.Marland KitchenMarland Kitchen continue same, get wt down.  NONTOXIC MULTINODULAR GOITER (ICD-241.1) - eval and rx by DrBalan for Endocrinology & DrGerkin for CCS... she is asymptomatic... dominant nodule was needled and benign... surg consult from DrGerkin- elected observation & he checks her yearly...  ~  seen 6/10 by DrGerkin- f/u sonar w/o change in any of the nodules...  ~  labs 7/10 showed TSH= 0.89  ~  seen 6/11 by DrGerkin- f/u sonar w/o change in nodules.  OBESITY (ICD-278.00) - obese  w/ abd panniculus & we discussed diet + exercise strategies.[name].  ~  weight up to 205# 11/09- we discussed diet, calorie restriction, exercise, & get the weight down...  ~  weight 4/10 = 198#  ~  weight 7/10 = 194#  ~  weight 11/10 = 196#  ~  weight 2/11 = 184# (post op)  ~  weight 8/11 = 181#  GERD (ICD-530.81) - on PRILOSEC 20mg /d...  ~  colonoscopy 7/09 by Rodena Medin showed 4 sm polyps= tubular adenoma on bx... f/u planned 3 yrs...  NEPHROLITHIASIS, HX OF (ICD-V13.01)  Hx of BLADDER CANCER (ICD-188.9) - had hematuria in 2001 & referred to DrPeterson... 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 all subseq cystoscopies have been neg- no recurrence.  ~  she reports f/u w/ DrPeterson yearly & doing satis by her report.  DEGENERATIVE JOINT DISEASE (ICD-715.90) - s/p rotator cuff repair in 1991... s/p left TKR from DrGioffre in 2002...  OSTEOPENIA (ICD-733.90) - on ACTONEL 150/mo, ca++, MVI, etc...  ~  labs 8/11 showed Vit D level = 22... rec> start Vit D supplement extra 02-1998 u daily...  ANXIETY (ICD-300.00) - on XANAX 0.25mg  Prn... she has signif hx of claustrophobia, but denies that she has any anxiety...   Preventive Screening-Counseling &  Management  Alcohol-Tobacco     Smoking Status: quit     Year Started: 1956     Year Quit: 1996     Pack years: 3/4 ppd   Allergies: 1)  ! Codeine 2)  ! Epinephrine 3)  ! Morphine 4)  ! Erythromycin  Comments:  Nurse/Medical Assistant: The patient's medications and allergies were reviewed with the patient and were updated in the Medication and Allergy Lists.  Past History:  Past Medical History: ASTHMA (ICD-493.90) COPD (ICD-496) Hx of LUNG CANCER (ICD-162.9) HYPERTENSION (ICD-401.9) PAROXYSMAL ATRIAL FIBRILLATION (ICD-427.31) PERIPHERAL VASCULAR DISEASE (ICD-443.9) VENOUS INSUFFICIENCY (ICD-459.81) HYPERLIPIDEMIA (ICD-272.4) DIABETES MELLITUS (ICD-250.00) NONTOXIC MULTINODULAR GOITER (ICD-241.1) OBESITY (ICD-278.00) GERD (ICD-530.81) NEPHROLITHIASIS, HX OF (ICD-V13.01) Hx of BLADDER CANCER (ICD-188.9) DEGENERATIVE JOINT DISEASE (ICD-715.90) OSTEOPENIA (ICD-733.90) ANXIETY (ICD-300.00)  Past Surgical History: S/P right rotator cuff repair by DrPresson 1991 S/P TURBT 10/01by DrPeterson S/P left TKR by DrGioffre 2002 S/P right upper lobectomy for lung cancer 11/05 by DrBurney (bronchoalveolar cell ca) S/P left upper lobectomy & node dissection 1/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) S/P D&C for removal of endometrial polyp (benign) 12/10 by GYN  Family History: Reviewed history from 06/01/2008 and no changes required. mother died 40 with copd heat failure father died 71 mi and ulcers 5 Siblings: 1 brother died from cancer, allergies 1 brother died at 45 with cancer, heart disease, asthma 1 sister has emphysema, asthma 1 sister has heart disease, allergies 1 sister has rheumatism, allergies  Social History: Reviewed history from 06/01/2008 and no changes required. Married, husb= Ronnetta Currington 1 child ex-smoker, quit 1999 social alcohol w/ wine daily retired  Review of Systems      See HPI       The patient complains of dyspnea on  exertion.  The patient denies anorexia, fever, weight loss, weight gain, vision loss, decreased hearing, hoarseness, chest pain, syncope, peripheral edema, prolonged cough, headaches, hemoptysis, abdominal pain, melena, hematochezia, severe indigestion/heartburn, hematuria, incontinence, muscle weakness, suspicious skin lesions, transient blindness, difficulty walking, depression, unusual weight change, abnormal bleeding, enlarged lymph nodes, and angioedema.    Vital Signs:  Patient profile:   76 year old female Height:  62 inches Weight:      181.13 pounds O2 Sat:      96 % on Room air Temp:     97.8 degrees F oral Pulse rate:   100 / minute BP sitting:   140 / 80  (left arm) Cuff size:   large  Vitals Entered By: Randell Loop CMA (September 25, 2009 11:41 AM)  O2 Sat at Rest %:  96 O2 Flow:  Room air CC: 4 month ROV & review of mult medical problems... Is Patient Diabetic? Yes Pain Assessment Patient in pain? yes      Comments meds updated today with pt   Physical Exam  Additional Exam:  WD, WN, 76 y/o  WF in NAD... GENERAL:  Alert & oriented; pleasant & cooperative... HEENT:  [place]/AT, EOM-wnl, PERRLA, EACs-clear, TMs-wnl, NOSE-clear, THROAT-clear & wnl. NECK:  Supple w/ fairROM; no JVD; normal carotid impulses w/o bruits; no thyromegaly or nodules palpated; no lymphadenopathy. CHEST:  Decr BS bilat; scat wheezing/ rhonchi bilat, no rales or consolidation; s/p VATS surg scar on right & mini thoracotomy scar on left... sl tender to palp left chest wall... HEART:  Regular Rhythm; without murmurs/ rubs/ or gallops heard... ABDOMEN:  Obese w/ panniculus, soft & nontender; normal bowel sounds; no organomegaly or masses detected. EXT:  S/P left TKR; mild arthritic changes; no varicose veins/ +venous insuffic/ tr edema. NEURO:  CN's intact;  no focal neuro deficits... DERM:  No lesions noted; no rash etc...    MISC. Report  Procedure date:  09/22/2009  Findings:      Lipid  Panel (LIPID)   Cholesterol          [H]  225 mg/dL                   4-540   Triglycerides        [H]  238.0 mg/dL                 9.8-119.1   HDL                       47.82 mg/dL                 >95.62 Cholesterol LDL - Direct                             156.1 mg/dL   BMP (METABOL)   Sodium                    143 mEq/L                   135-145   Potassium            [H]  5.2 mEq/L                   3.5-5.1   Chloride                  108 mEq/L                   96-112   Carbon Dioxide            28 mEq/L                    19-32   Glucose              [H]  137 mg/dL  70-99   BUN                       22 mg/dL                    4-85   Creatinine                0.6 mg/dL                   4.6-2.7   Calcium                   9.6 mg/dL                   0.3-50.0   GFR                       98.48 mL/min                >60  Tests: (3) Hepatic/Liver Function Panel (HEPATIC)   Total Bilirubin           0.7 mg/dL                   9.3-8.1   Direct Bilirubin          0.1 mg/dL                   8.2-9.9   Alkaline Phosphatase [L]  34 U/L                      39-117   AST                       22 U/L                      0-37   ALT                       23 U/L                      0-35   Total Protein             6.9 g/dL                    3.7-1.6   Albumin                   4.2 g/dL                    9.6-7.8  Comments:      CBC Platelet w/Diff (CBCD)   White Cell Count          6.9 K/uL                    4.5-10.5   Red Cell Count            4.35 Mil/uL                 3.87-5.11   Hemoglobin                14.9 g/dL                   93.8-10.1   Hematocrit                43.3 %  36.0-46.0   MCV                       99.7 fl                     78.0-100.0   Platelet Count            232.0 K/uL                  150.0-400.0   Neutrophil %              59.4 %                      43.0-77.0   Lymphocyte %              29.2 %                       12.0-46.0   Monocyte %                7.0 %                       3.0-12.0   Eosinophils%              3.6 %                       0.0-5.0   Basophils %               0.8 %                       0.0-3.0  TSH (TSH)   FastTSH                   0.98 uIU/mL                 0.35-5.50  Hemoglobin A1C (A1C)   Hemoglobin A1C            5.7 %                       4.6-6.5  Vitamin D (25-Hydroxy) (14782)  Vitamin D (25-Hydroxy)                        [L]  22 ng/mL                    30-89   Impression & Recommendations:  Problem # 1:  Hx of LUNG CANCER (ICD-162.9) Followed by DrBurney & DrMohammed... notes reviewed & discussed w/ pt.  Problem # 2:  DYSPNEA (ICD-786.05) She has underlying COPD, Asthma>  continue meds & exercise program.  Problem # 3:  ASTHMA (ICD-493.90) As above... The following medications were removed from the medication list:    Medrol (pak) 4 Mg Tabs (Methylprednisolone) .[name]... Take as directed til gone... Her updated medication list for this problem includes:    Advair Diskus 500-50 Mcg/dose Aepb (Fluticasone-salmeterol) ..... Use one puff two times a day    Spiriva Handihaler 18 Mcg Caps (Tiotropium bromide monohydrate) ..... Inhale one capsule by mouth every day    Proventil Hfa 108 (90 Base) Mcg/act Aers (Albuterol sulfate) .[name]... 1-2 sprays up to every 4h as needed for wheezing...  Problem # 4:  HYPERTENSION (ICD-401.9) Controlled>  same meds. Her updated medication list for this problem  includes:    Verapamil Hcl Cr 240 Mg Xr24h-cap (Verapamil hcl) .[name]... Take 1 cap by mouth once daily...    Diovan Hct 160-12.5 Mg Tabs (Valsartan-hydrochlorothiazide) .[name]... Take 1 tablet by mouth once a day    Furosemide 20 Mg Tabs (Furosemide) .[name]... Take 1 tab by mouth once daily as needed for swelling...  Problem # 5:  HYPERLIPIDEMIA (ICD-272.4) She is intol to all statins even the low dose Livolo... on diet + exercise, but numbers are not at goal & she understands that she has  no other options> I have rec ret to lipid clinic but she declines, not having found this helpful in the past... Her updated medication list for this problem includes:    Fenoglide 120 Mg Tabs (Fenofibrate) .[name]... Take 1 tab by mouth once daily...  Problem # 6:  DIABETES MELLITUS (ICD-250.00) Controlled on diet alone w/ A1c= 5.7.Marland KitchenMarland Kitchen rec weight loss! Her updated medication list for this problem includes:    Adult Aspirin Low Strength 81 Mg Tbdp (Aspirin) .[name]... Take 1 tablet by mouth once a day    Diovan Hct 160-12.5 Mg Tabs (Valsartan-hydrochlorothiazide) .[name]... Take 1 tablet by mouth once a day    Metformin Hcl 500 Mg Tabs (Metformin hcl) .[name]... Take 1 tablet by mouthin the am...  Problem # 7:  NONTOXIC MULTINODULAR GOITER (ICD-241.1) Followed by DrGerkin & prev seen by DrBalan... stable, no change in Sonar 6/11.  Problem # 8:  DEGENERATIVE JOINT DISEASE (ICD-715.90) She has LBP & sees DrRamos... Her updated medication list for this problem includes:    Adult Aspirin Low Strength 81 Mg Tbdp (Aspirin) .[name]... Take 1 tablet by mouth once a day    Tylenol Arthritis Pain 650 Mg Cr-tabs (Acetaminophen) .[name]... Take 2 tablets by mouth once daily  Complete Medication List: 1)  Advair Diskus 500-50 Mcg/dose Aepb (Fluticasone-salmeterol) .... Use one puff two times a day 2)  Spiriva Handihaler 18 Mcg Caps (Tiotropium bromide monohydrate) .... Inhale one capsule by mouth every day 3)  Proventil Hfa 108 (90 Base) Mcg/act Aers (Albuterol sulfate) .[name].. 1-2 sprays up to every 4h as needed for wheezing... 4)  Adult Aspirin Low Strength 81 Mg Tbdp (Aspirin) .... Take 1 tablet by mouth once a day 5)  Verapamil Hcl Cr 240 Mg Xr24h-cap (Verapamil hcl) .... Take 1 cap by mouth once daily.Marland KitchenMarland Kitchen 6)  Diovan Hct 160-12.5 Mg Tabs (Valsartan-hydrochlorothiazide) .... Take 1 tablet by mouth once a day 7)  Furosemide 20 Mg Tabs (Furosemide) .... Take 1 tab by mouth once daily as needed for swelling... 8)  Fenoglide 120 Mg Tabs  (Fenofibrate) .... Take 1 tab by mouth once daily.Marland KitchenMarland Kitchen 9)  Fish Oil Maximum Strength 1200 Mg Caps (Omega-3 fatty acids) .... Take 1 cap by mouth once daily... 10)  Coq10 100 Mg Caps (Coenzyme q10) .... Take 1 tablet by mouth once a day 11)  Metformin Hcl 500 Mg Tabs (Metformin hcl) .... Take 1 tablet by mouthin the am... 12)  Actonel 150 Mg Tabs (Risedronate sodium) .... Take 1 tab by mouth each month... 13)  Caltrate 600+d 600-400 Mg-unit Tabs (Calcium carbonate-vitamin d) .... Take 1 tablet by mouth once a day 14)  Multivitamins Tabs (Multiple vitamin) .... Take 1 tablet by mouth once a day 15)  Vitamin D3 2000 Unit Caps (Cholecalciferol) .... Take 1 cap by mouth once daily... 16)  Tylenol Arthritis Pain 650 Mg Cr-tabs (Acetaminophen) .... Take 2 tablets by mouth once daily 17)  Magic Mouthwash  .... 1 tsp swish, gargle &  swallow up to 4 times daily as needed...  Patient Instructions: 1)  Today we updated your med list- see below.... 2)  We wrote a new perscription for Magic Mouthwash- 1 tsp swish gargle & swallow up to 4 times daily as needed... 3)  Remember to add 2000 u Vit D to your regimen... 4)  We reviewed your recent lab work today... 5)  Call for any questions.Marland KitchenMarland Kitchen 6)  Please schedule a follow-up appointment in 6 months. Prescriptions: MAGIC MOUTHWASH 1 tsp swish, gargle & swallow up to 4 times daily as needed...  #4 oz x 5   Entered and Authorized by:   Michele Mcalpine MD   Signed by:   Michele Mcalpine MD on 09/25/2009   Method used:   Print then Give to Patient   RxID:   [id number]

## 2010-03-06 NOTE — Letter (Signed)
Summary: [facility]  [facility]   Imported By: Sherian Rein 02/06/2009 09:46:41  _____________________________________________________________________  External Attachment:    Type:   Image     Comment:   External Document

## 2010-03-06 NOTE — Progress Notes (Signed)
Summary: Right eye pain and discharge requesting pred  Phone Note Call from Patient Call back at Home Phone [phone]   Caller: Patient Call For: nadel Summary of Call: wants to speak to leigh asap about her attack Initial call taken by: Lacinda Axon,  January 04, 2010 9:00 AM  Follow-up for Phone Call        Pt c/o "attacks"=right eye pulsating pain radiating down side of face and to top of head and upper gums, watery drainage from right eye, increased SOB. Pt is requesting "quick relief" to get her through the holidays. States does not know any ENT's to go to per SN recs and wants a 30 day supply of "maintenance prednisone" and then she "will do whatever Dr. Kriste Basque" tells her to. Last "attack" was Monday and pt stated she felt one coming on while on the phone with me. Please advise. Thanks. Zackery Barefoot CMA  January 04, 2010 9:19 AM  Allergies:  1)  ! Codeine 2)  ! Epinephrine 3)  ! Morphine 4)  ! Erythromycin  Additional Follow-up for Phone Call Additional follow up Details #1::        per SN-----she will need to call and see her eye doctor again for this and we are going to set her up an appt to see neurology for this pain----ok for her to have prednisone 20mg    #30   take as directed----use this prednisone sparingly.  with no refills. thanks Randell Loop CMA  January 04, 2010 10:38 AM     Additional Follow-up for Phone Call Additional follow up Details #2::    Per Leigh, directions for Prednisone can read take 1/2-1 tab daily as needd. Pt is aware to use this medication sparingly per SN and that he is referring her for neuro eval. She says she is scheduled to see her eye dr next week. Follow-up by: Michel Bickers CMA,  January 04, 2010 11:04 AM  New/Updated Medications: PREDNISONE 20 MG TABS (PREDNISONE) Take 1/2 to 1 tab daily as needed Prescriptions: PREDNISONE 20 MG TABS (PREDNISONE) Take 1/2 to 1 tab daily as needed  #30 x 0   Entered by:   Michel Bickers CMA  Authorized by:   Michele Mcalpine MD   Signed by:   Michel Bickers CMA on 01/04/2010   Method used:   Electronically to        [facility]  [address] [id number]* (retail)       [address]       [place], [place]  [address]       Ph: [phone] or [phone]       Fax: [phone]   RxID:   [id number]

## 2010-03-06 NOTE — Progress Notes (Signed)
Summary: hip and shoulder pain  Phone Note Call from Patient Call back at [phone]   Caller: Patient Call For: nadel Summary of Call: pt have shoulder and hip pain would like to get muscle relaxer [facility] [address] Initial call taken by: Rickard Patience,  Jun 07, 2009 3:07 PM  Follow-up for Phone Call        Spoke with pt.  She c/o tr hip pain and neck pain x several wks.  She thought that it was due to livalo, but after she stopped taking med the pain never went away.  Has tried tylenol arthritis and advil with very little relief.  Please advise thanks allergic to morhine, epinephrine, e-mycin, and codiene Follow-up by: Vernie Murders,  Jun 07, 2009 3:14 PM  Additional Follow-up for Phone Call Additional follow up Details #1::        per SN---ok for her to have soma 350mg   #90  1 by mouth three times a day as needed for muscle spasms.  called and spoke with pt and she is aware of med sent to her pharmacy Randell Loop CMA  Jun 07, 2009 4:02 PM     New/Updated Medications: SOMA 350 MG TABS (CARISOPRODOL) take one tablet by mouth three times a day as needed for muscle spasms Prescriptions: SOMA 350 MG TABS (CARISOPRODOL) take one tablet by mouth three times a day as needed for muscle spasms  #90 x 1   Entered by:   Randell Loop CMA   Authorized by:   Michele Mcalpine MD   Signed by:   Randell Loop CMA on 06/07/2009   Method used:   Electronically to        [facility]  [address] [id number]* (retail)       [address]       [place], [place]  [address]       Ph: [phone] or [phone]       Fax: [phone]   RxID:   [id number]

## 2010-03-06 NOTE — Progress Notes (Signed)
Summary: sinus results  Phone Note Call from Patient Call back at Home Phone [phone]   Caller: Patient Call For: nadel Reason for Call: Lab or Test Results Summary of Call: Requests sinus results. Initial call taken by: Darletta Moll,  January 01, 2010 2:43 PM  Follow-up for Phone Call        pt requesting results of Sinus xray, results have been printed. Please advsie.Carron Curie CMA  January 01, 2010 3:44 PM   per SN----sinus xrays looked ok---no chronic sinus disease/changes.  lmomtcb for pt to make her aware and results are also on the phonetree. Randell Loop CMA  January 01, 2010 5:25 PM    Additional Follow-up for Phone Call Additional follow up Details #1::        Pt reports that she is still taking Augmentin, Mucinex , nasal saline and is down to 1/2 tab daily on Pred.  Pt still c/o pain under right eye, right sinus pressure, right ear and neck pain.  Mucus is thin and clear.  Pt doesn't know what to do next.  Please advise. Abigail Miyamoto RN  January 02, 2010 10:13 AM     Additional Follow-up for Phone Call Additional follow up Details #2::    next step is ENT eval---they may need to do further xrays---she can call her ENT for appt.  thanks Randell Loop CMA  January 02, 2010 11:33 AM   Pt informed of Dr Jodelle Green recommendations.  Pt is unsure of name of ENT that she has seen on the past .  She will call out office if she needs a new referral. Abigail Miyamoto RN  January 02, 2010 12:05 PM

## 2010-03-06 NOTE — Assessment & Plan Note (Signed)
Summary: F/U LUNG CX ///kp- ok per leigh   CC:  2 month ROV & post hosp follow up....  History of Present Illness: 77 y/o WF here for a follow up visit... she has mult medical specialists who follow all of her medical problems (see below)...   ~  January 02, 2009:  just had f/u CT Chest by DrMohammed w/ LUL nodule seen & PET CT + Bx planned... she also has appt w/ DrBurney... understandibly concerned, but she was also concerned about her long prob list here & we reviewed these diagnoses in detail... labs done 11/10 and Chol elevated but she didn't continue the Livalo so we gave her some more samples and new Rx written...   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 she had post op AFib & sm hydropneumothorax... disch on Amio & she wants off this med- weaned it on her own to 1/d so far... DrBurney continues to follow her regularly w/ CXR etc... BP meds adjusted in hosp & stable since disch;  Chol is improved on the Livalo;  sugars are satis on the Metformin one per day...    Current Problem List:  ASTHMA (ICD-493.90) / COPD (ICD-496) - ex-smoker, quit 1997... on ADVAIR 500 (only taking once daily) & SPIRIVA 1/d + PROAIR Prn... she was participating in [facility] (last 3/09) & stopped on her own... may have a superimposed component of restriction due to obesity & prev lung surgeries...  ~  PFT 8/08 showed FVC=2.02 (77%), FEV1=1.07 (51%), %1sec=53, mid-flows=19%pred...  ~  PFT 7/09 today= FVC=1.93 (72%), FEV1=1.04 (49%), %1sec=54, mid-flows=19%pred...  ~  2/11: post hosp- reminded to use the Advair Bid & Spiriva daily; incr exercise program.  Hx of LUNG CANCER, UPPER LOBE (ICD-162.3) - s/p right upper lobectomy by DrBurney 11/05 for a stage 1A non-small cell lung cancer (adenocarcinoma w/ bronchalveolar cell features)... oncology eval and continued f/u  by DrMohammed & DrBurney on observation>>  ~  CT Chest 11/08 showed no recurrence, mult bilat nodules unchanged x3+ yrs, nodular thyroid w/o change...  ~  CXR 7/09 showed stable post-op changes and scarring on the right, NAD.[name].  ~  CTAngio Chest 10/09 showed neg for PE, prom thyroid, atherosclerotic changes in Ao, no change in ground-glass nodules in LUL area...  ~  CT Chest 11/10 by DrMohammed showed new LUL solid nodule, no change in ground-glass areas... lesion was PET pos...  ~  1/11:  s/p LULobectomy & node dissection via minithoracotomy by DrBurney- path showed 3 foci of well diff bronchoalveolar cell ca & neg nodes... decision made at conference for no chemoRx, EGFR assay pending.  ~  2/11: post op improving slowly w/ CXR monitoring by DrBurney.  HYPERTENSION (ICD-401.9) - on DIOVAN/Hct 160-12.5 Daily, & LASIX 20mg  "Prn"... BP= 130/74 and doing well now, tolerating Rx etc... BP checks are controlled at home too... denies HA, fatigue, visual changes, CP, palipit, dizziness, syncope, dyspnea, edema, etc...   ~  Cardiac eval 9/09 by Walker Kehr was neg and 2DEcho showed DD w/ norm LVsys function, EF= 60-65%, no regional wall motion abn...  PAROXYSMAL ATRIAL FIBRILLATION (ICD-427.31) - hx PAF in the post op period... converted to NSR & holding... transiently on Amiodarone & she wanted off Rx...  PERIPHERAL VASCULAR DISEASE (ICD-443.9) - she has atherosclerotic changes in her Ao noted on her prev scans...   ~  11/10: pt had mult questions about this problem on the prob list- discussed in detail.  VENOUS INSUFFICIENCY (ICD-459.81) - she knows to follow a low sodium diet, elevate legs, wear support hose, etc... she insists on keeping Lasix 20mg  on hand for Prn use...  HYPERLIPIDEMIA (ICD-272.4) - currently on LIVALO 2mg - 1/2 tab daily, FENOGLIDE 120mg /d, & FISH OIL supplement... prev on Vytorin but INTOL, & she states INTOL to all statins... she was not satis by the [facility] in the past.  ~  labs  8/08 off Vytorin showed TChol 183, TG 207, HDL 46, LDL 112... try fenofibrate...  ~  FLP 5/09 on Feno120 showed TChol 188, TG 111, HDL 28, LDL 137... cont same, better diet, get wt down!  ~  FLP 4/10 on Feno120 showed TChol 240, TG 104, HDL 49, LDL 165... I rec f/u [facility] Clinic, she declined.  ~  FLP 7/10 on Feno120 showed TChol 222, TG 152, HDL 36, LDL 172... rec> try LIVALO 2mg - 1/2 tab Qhs.  ~  FLP 11/10 on Feno120+FishOil showed TChol 253, TG 125, HDL 42, LDL 208... try LIVALO2mg - 1/2 tab/d & stay on it.  ~  FLP 1/11 on Liv1mg +Feno120 showed TChol 170, TG 101, HDL 45, LDL 105... continue same.  DIABETES MELLITUS (ICD-250.00) - started on METFORMIN 500mg Bid Apr10... we had stressed the importance of diet- low carb/ low fat and weight reduction, along w/ her pulm rehab exercises...  ~  labs 4/10 showed BS= 131, HgA1c= 7.0.Marland KitchenMarland Kitchen Metformin500Bid started.  ~  labs 7/10 showed BS= 134, A1c= 6.0  ~  labs 11/10 showed BS= 148, A1c= 6.5  ~  1/11:  she cut the Metformin to 1/d due to nausea.  NONTOXIC MULTINODULAR GOITER (ICD-241.1) - eval and rx by DrBalan for endocrinology... she is asymptomatic... dominant nodule was needled and benign... surg consult from DrGerkin- elected observation & he checks her yearly...  ~  seen 6/10 by DrGerkin- f/u sonar w/o change in any of the nodules...  ~  labs 7/10 showed TSH= 0.89  OBESITY (ICD-278.00) - obese w/ abd panniculus & we discussed diet + exercise strategies.[name].  ~  weight up to 205# 11/09- we discussed diet, calorie restriction, exercise, & get the weight down...  ~  weight 4/10 = 198#  ~  weight 7/10 = 194#  ~  weight 11/10 = 196#  ~  weight 2/1 = 184# (post op)  GERD (ICD-530.81) - on PRILOSEC 20mg /d...  ~  colonoscopy 7/09 by Rodena Medin showed 4 sm polyps= tubular adenoma on bx... f/u planned 3 yrs...  NEPHROLITHIASIS, HX OF (ICD-V13.01)  Hx of BLADDER CANCER (ICD-188.9) - had hematuria in 2001 & referred to DrPeterson... 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 all subseq cystoscopies have been neg- no recurrence.  ~  she reports f/u w/ DrPeterson yearly & doing satis by her report.  DEGENERATIVE JOINT DISEASE (ICD-715.90) - s/p rotator cuff repair in 1991... s/p left TKR from DrGioffre in 2002...  OSTEOPENIA (ICD-733.90) - on ACTONEL 150/mo, ca++, MVI, etc...  ANXIETY (ICD-300.00) - on XANAX 0.25mg  Prn... she has signif hx of claustrophobia, but denies that she has any anxiety...    Allergies: 1)  ! Codeine 2)  ! Epinephrine 3)  ! Morphine 4)  ! Erythromycin  Past History:  Past Medical History:  ASTHMA (ICD-493.90) COPD (ICD-496) Hx of LUNG CANCER, UPPER LOBE (ICD-162.3) HYPERTENSION (ICD-401.9) PAROXYSMAL ATRIAL FIBRILLATION (ICD-427.31) PERIPHERAL VASCULAR DISEASE (ICD-443.9) VENOUS INSUFFICIENCY (ICD-459.81)  HYPERLIPIDEMIA (ICD-272.4) DIABETES MELLITUS (ICD-250.00) NONTOXIC MULTINODULAR GOITER (ICD-241.1) OBESITY (ICD-278.00) GERD (ICD-530.81) NEPHROLITHIASIS, HX OF (ICD-V13.01) Hx of BLADDER CANCER (ICD-188.9) DEGENERATIVE JOINT DISEASE (ICD-715.90) OSTEOPENIA (ICD-733.90) ANXIETY (ICD-300.00)  Past Surgical History:  S/P right rotator cuff repair by DrPresson 1991 S/P TURBT 10/01by DrPeterson S/P left TKR by DrGioffre 2002 S/P right upper lobectomy for lung cancer 11/05 by DrBurney (bronchoalveolar cell ca) S/P left upper lobectomy & node dissection 1/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) S/P D&C for removal of endometrial polyp (benign) 12/10 by GYN  Family History: Reviewed history from 06/01/2008 and no changes required. mother died 64 with copd heat failure father died 66 mi and ulcers 5 Siblings: 1 brother died from cancer, allergies 1 brother died at 47 with cancer, heart disease, asthma 1 sister has emphysema, asthma 1 sister has heart disease, allergies 1 sister has rheumatism, allergies  Social  History: Reviewed history from 06/01/2008 and no changes required. Married, husb= Lanijah Warzecha 1 child ex-smoker, quit 1999 social alcohol w/ wine daily retired  Review of Systems      See HPI       The patient complains of chest pain and dyspnea on exertion.  The patient denies anorexia, fever, weight loss, weight gain, vision loss, decreased hearing, hoarseness, syncope, peripheral edema, prolonged cough, headaches, hemoptysis, abdominal pain, melena, hematochezia, severe indigestion/heartburn, hematuria, incontinence, muscle weakness, suspicious skin lesions, transient blindness, difficulty walking, depression, unusual weight change, abnormal bleeding, enlarged lymph nodes, and angioedema.    Vital Signs:  Patient profile:   76 year old female Height:      62 inches Weight:      183.38 pounds BMI:     33.66 O2 Sat:      96 % on Room air Temp:     97.5 degrees F oral Pulse rate:   106 / minute BP sitting:   130 / 74  (right arm) Cuff size:   regular  Vitals Entered By: Randell Loop CMA (March 07, 2009 3:31 PM)  O2 Sat at Rest %:  96 O2 Flow:  Room air CC: 2 month ROV & post hosp follow up... Comments meds updated today   Physical Exam  Additional Exam:  WD, WN, 76 y/o  WF in NAD... GENERAL:  Alert & oriented; pleasant & cooperative... HEENT:  /AT, EOM-wnl, PERRLA, EACs-clear, TMs-wnl, NOSE-clear, THROAT-clear & wnl. NECK:  Supple w/ fairROM; no JVD; normal carotid impulses w/o bruits; no thyromegaly or nodules palpated; no lymphadenopathy. CHEST:  Decr BS bilat; clear to P & A; without wheezes/ rales/ or rhonchi, s/p VATS surg scar on right & mini thoracotomy scar on left... sl tender to palp left chest wall... HEART:  Regular Rhythm; without murmurs/ rubs/ or gallops heard... ABDOMEN:  Obese w/ panniculus, soft & nontender; normal bowel sounds; no organomegaly or masses detected. EXT:  S/P left TKR; mild arthritic changes; no varicose veins/ +venous insuffic/ tr  edema. NEURO:  CN's intact;  no focal neuro deficits... DERM:  No lesions noted; no rash etc...     MISC. Report  Procedure date:  03/07/2009  Findings:      DATA REVIEWED:   ~  Pre-op eval from DrMohammed & DrBurney...  ~  Hosp by DrBurney 1/3- 02/14/09 including H&P, Op note, DC Summary, XRay reports, Lab cumulative summary sheet...  ~  Office note from DrBurney post op 02/21/09...    Impression & Recommendations:  Problem # 1:  COPD (ICD-496) She appears stable post op-  again reminded to  take Advair Bid & Spiriva daily... Her updated medication list for this problem includes:    Advair Diskus 500-50 Mcg/dose Aepb (Fluticasone-salmeterol) ..... Use one puff two times a day    Spiriva Handihaler 18 Mcg Caps (Tiotropium bromide monohydrate) ..... Inhale one capsule by mouth every day    Ventolin Hfa 108 (90 Base) Mcg/act Aers (Albuterol sulfate) .[name]... 2 puffs every 4 hours as needed for shortness of breath  Problem # 2:  Hx of LUNG CANCER, UPPER LOBE (ICD-162.3) Now s/p both upper lobes removed...  she tells me that DrBurney will continue to follow her CXR's etc... her case was presented at [facility] conference & no chemoRx recommended... EGFR assay pending (poss Tarceva Rx).  Problem # 3:  HYPERTENSION (ICD-401.9) Stable on these meds... continue to follow. The following medications were removed from the medication list:    Verapamil Hcl Cr 240 Mg Cp24 (Verapamil hcl) .[name]... Take 1 tablet by mouth once a day Her updated medication list for this problem includes:    Diovan Hct 160-12.5 Mg Tabs (Valsartan-hydrochlorothiazide) .[name]... Take 1 tablet by mouth once a day    Furosemide 20 Mg Tabs (Furosemide) .[name]... Take 1 tab by mouth once daily as needed for swelling...  Problem # 4:  PAROXYSMAL ATRIAL FIBRILLATION (ICD-427.31) Post op AFib- converted to NSR... she was disch on Amio but has already weaned this down on her own & she wants off this... we will continue to follow & rec f/u  DrNishan for Cards. The following medications were removed from the medication list:    Verapamil Hcl Cr 240 Mg Cp24 (Verapamil hcl) .[name]... Take 1 tablet by mouth once a day Her updated medication list for this problem includes:    Adult Aspirin Low Strength 81 Mg Tbdp (Aspirin) .[name]... Take 1 tablet by mouth once a day  Problem # 5:  HYPERLIPIDEMIA (ICD-272.4) FLP improved on the LIVALO 1/2 tab daily... continue same. Her updated medication list for this problem includes:    Livalo 2 Mg Tabs (Pitavastatin calcium) .[name]... Take 1/2 tab by mouth at bedtime...    Fenoglide 120 Mg Tabs (Fenofibrate) .[name]... Take 1 tab by mouth once daily...  Problem # 6:  DIABETES MELLITUS (ICD-250.00) She felt nauseated and decr the Metformin on her own... continue 500mg  Qam for now... Her updated medication list for this problem includes:    Adult Aspirin Low Strength 81 Mg Tbdp (Aspirin) .[name]... Take 1 tablet by mouth once a day    Diovan Hct 160-12.5 Mg Tabs (Valsartan-hydrochlorothiazide) .[name]... Take 1 tablet by mouth once a day    Metformin Hcl 500 Mg Tabs (Metformin hcl) .[name]... Take 1 tablet by mouthin the am...  Problem # 7:  NONTOXIC MULTINODULAR GOITER (ICD-241.1) Followed by DrBalan & DrGerkin...  Problem # 8:  OBESITY (ICD-278.00) Weight is improved post op... needs to continue weight reduction.  Problem # 9:  OTHER MEDICAL PROBLEMS AS NOTED>>>  Complete Medication List: 1)  Advair Diskus 500-50 Mcg/dose Aepb (Fluticasone-salmeterol) .... Use one puff two times a day 2)  Spiriva Handihaler 18 Mcg Caps (Tiotropium bromide monohydrate) .... Inhale one capsule by mouth every day 3)  Ventolin Hfa 108 (90 Base) Mcg/act Aers (Albuterol sulfate) .... 2 puffs every 4 hours as needed for shortness of breath 4)  Adult Aspirin Low Strength 81 Mg Tbdp (Aspirin) .... Take 1 tablet by mouth once a day 5)  Diovan Hct 160-12.5 Mg Tabs (Valsartan-hydrochlorothiazide) .... Take 1 tablet by mouth once a day 6)  Furosemide 20 Mg Tabs (Furosemide) .... Take 1 tab by mouth once daily as needed for swelling... 7)  Livalo 2 Mg Tabs (Pitavastatin calcium) .... Take 1/2 tab by mouth at bedtime.Marland KitchenMarland Kitchen 8)  Fenoglide 120 Mg Tabs (Fenofibrate) .... Take 1 tab by mouth once daily.Marland KitchenMarland Kitchen 9)  Fish Oil Maximum Strength 1200 Mg Caps (Omega-3 fatty acids) .... Take 1 cap by mouth once daily... 10)  Metformin Hcl 500 Mg Tabs (Metformin hcl) .... Take 1 tablet by mouthin the am... 11)  Prilosec 20 Mg Cpdr (Omeprazole) .... Take 1 tab by mouth once daily (30 min before the 1st meal of the day) 12)  Actonel 150 Mg Tabs (Risedronate sodium) .... Take 1 tab by mouth each month... 13)  Caltrate 600+d 600-400 Mg-unit Tabs (Calcium carbonate-vitamin d) .... Take 1 tablet by mouth once a day 14)  Multivitamins Tabs (Multiple vitamin) .... Take 1 tablet by mouth once a day 15)  Xanax 0.25 Mg Tabs (Alprazolam) .[name].. 1 by mouth three times a day as needed for anxiety 16)  Zithromax Z-pak 250 Mg Tabs (Azithromycin) .... Take as directed...  Other Orders: Prescription Created Electronically [id number])  Patient Instructions: 1)  Today we updated your med list- see below.... 2)  See the current med list below... you may wean off the Amiodarone, and cut the METFORMIN to one tab in the AM..[name] 3)  Call for any questions.Marland KitchenMarland Kitchen 4)  Please schedule a follow-up appointment in 3 months, sooner as needed. Prescriptions: ZITHROMAX Z-PAK 250 MG TABS (AZITHROMYCIN) take as directed...  #1 pack x 2   Entered and Authorized by:   Michele Mcalpine MD   Signed by:   Michele Mcalpine MD on 03/07/2009   Method used:   Print then Give to Patient   RxID:   [id number]

## 2010-03-06 NOTE — Progress Notes (Signed)
Summary: talk to nurse  Phone Note Call from Patient Call back at [phone]   Caller: Patient Call For: nadal Reason for Call: Talk to Nurse Summary of Call: Pt states she was given a rx for carisoprodol on 5/4 for her back and it's not working, making her nauseated, needs something else, pls advise.[facility] Initial call taken by: Darletta Moll,  Jun 09, 2009 8:40 AM  Follow-up for Phone Call        pt states Tresa Garter is nothelping ehr back pain at all. She staets she has even been takin gtylenol along with the soma without relief. She also states sicne she began this med she has been constantly nauseous. She is requesting med be chanegd to something else. Please advise. Carron Curie CMA  Jun 09, 2009 9:51 AM allergies: codeine, morphine, epinephrine, erythromycin  Additional Follow-up for Phone Call Additional follow up Details #1::        per SN---she needs her ortho appt ASAP---change to robaxin 500mg   #30  1 by mouth three times a day with no refills.  she will need to call her ortho doc today for appt for follow up.  thanks Randell Loop CMA  Jun 09, 2009 10:34 AM   pt advised rx sent. she states she has not seen an ortho doctor in 10 yrs and request a referral. She request appt be with [facility] either Dr. Despina Hick or Dr. Thomasena Edis. Order palced. Carron Curie CMA  Jun 09, 2009 10:44 AM     New/Updated Medications: ROBAXIN 500 MG TABS (METHOCARBAMOL) Take 1 tablet by mouth three times a day as needed Prescriptions: ROBAXIN 500 MG TABS (METHOCARBAMOL) Take 1 tablet by mouth three times a day as needed  #30 x 0   Entered by:   Carron Curie CMA   Authorized by:   Michele Mcalpine MD   Signed by:   Carron Curie CMA on 06/09/2009   Method used:   Electronically to        [facility]  [address] [id number]* (retail)       [address]       [place], [place]  [address]       Ph: [phone] or [phone]       Fax: [phone]   RxID:    [id number]

## 2010-03-06 NOTE — Letter (Signed)
Summary: [facility]  [facility]   Imported By: Sherian Rein 07/27/2009 10:56:20  _____________________________________________________________________  External Attachment:    Type:   Image     Comment:   External Document

## 2010-03-06 NOTE — Letter (Signed)
Summary: [facility]   Imported By: Sherian Rein 08/09/2009 07:53:04  _____________________________________________________________________  External Attachment:    Type:   Image     Comment:   External Document

## 2010-03-06 NOTE — Progress Notes (Signed)
Summary: TALK TO NURSE  Phone Note Call from Patient   Caller: Patient Call For: NADEL Summary of Call: PT WILL BE OUT OF TOWN FOR APPT WOULD LIKE TO KNOW IF ITS OKAY TO BE SEEN IN JUNE Initial call taken by: Rickard Patience,  May 01, 2009 3:38 PM  Follow-up for Phone Call        angela june will be fine for this pt do you mind calling her to schedule this?   Thanks   Philipp Deputy CMA  May 01, 2009 4:13 PM   pt reschedulled for 07/06/2009.  Follow-up by: Eugene Gavia,  May 01, 2009 4:17 PM

## 2010-03-06 NOTE — Progress Notes (Signed)
Summary: talk to nurse  Phone Note Call from Patient Call back at [phone]   Caller: Patient Call For: [name] Summary of Call: pt had ct scan with dye . she take metformin does she need kidney function test . Initial call taken by: Rickard Patience,  October 05, 2009 11:51 AM  Follow-up for Phone Call        Spoke with pt.  She states that she had ct chest with contrast yesterday and was advised that she may need to have liver fx test done due to being diabetic and on metformin.  Pls advise thanks Vernie Murders  October 05, 2009 2:27 PM   Additional Follow-up for Phone Call Additional follow up Details #1::        per SN---he did full labs on 8-19 and her renal and liver was ok.Marland KitchenMarland KitchenSN feels like she does not need these rechecked so soon even after ct with contrast.  check with Dr. Shirline Frees at her f/u appt to see if he feels the same.  thanks Randell Loop CMA  October 05, 2009 4:12 PM     Additional Follow-up for Phone Call Additional follow up Details #2::    Called, spoke with pt.  Pt informed of above recs per SN -- she verbalized understanding.   Follow-up by: Gweneth Dimitri RN,  October 05, 2009 4:15 PM

## 2010-03-06 NOTE — Progress Notes (Signed)
Summary: appt question  Phone Note Call from Patient Call back at Home Phone [phone]   Caller: Patient Call For: [name] Summary of Call: Pt has appt on 8/22, wants to know if she comes in for labs on 8/19 will the results be back when she comes in for her appt, pls advise. Initial call taken by: Darletta Moll,  September 14, 2009 11:43 AM  Follow-up for Phone Call        yes results will be back for appt.  pls have sn mark labs and put in idx for 8/19--let pt know once this has been done Follow-up by: Philipp Deputy CMA,  September 14, 2009 11:46 AM  Additional Follow-up for Phone Call Additional follow up Details #1::        per SN---ok for labs on 8-19.  labs are in the computer for pt. and pt  is aware of labs and that she will need to be fasting for these Randell Loop [facility]  September 14, 2009 2:08 PM

## 2010-03-06 NOTE — Miscellaneous (Signed)
[facility]: [facility]   Imported By: Lester Blythe 11/14/2009 08:30:13  _____________________________________________________________________  External Attachment:    Type:   Image     Comment:   External Document

## 2010-03-06 NOTE — Letter (Signed)
[facility]: [facility]   Imported By: Sherian Rein 05/15/2009 13:37:58  _____________________________________________________________________  External Attachment:    Type:   Image     Comment:   External Document

## 2010-03-06 NOTE — Progress Notes (Signed)
Summary: needs HFU w/ sn  Phone Note Call from Patient Call back at Home Phone [phone]   Caller: Patient Call For: nadel Summary of Call: pt [facility] (lung surgery- cancer). needs a HFU in 1 month w/ sn. please advise. pt wants to see sn.  Initial call taken by: Tivis Ringer,  February 16, 2009 4:53 PM  Follow-up for Phone Call        Nothing open until March.  Please advise when we can add pt to SN thanks! Vernie Murders  February 16, 2009 4:55 PM    ok to offer 1-26 at 3:30 for pt.  thanks Randell Loop CMA  February 16, 2009 4:56 PM   pt did not want appt in Jan. she said this was too soon, so per [place] okay to offer 03/20/09 at 3 pm. Pt ok with this appt. Carron Curie CMA  February 16, 2009 5:04 PM

## 2010-03-06 NOTE — Progress Notes (Signed)
Summary: labs  Phone Note Call from Patient Call back at Home Phone [phone]   Caller: Patient Call For: [name] Reason for Call: Talk to Nurse Summary of Call: pt wants to come in Monday or Tuesday morning and have labs drawn for Tuesday afternoon appt w/ Kriste Basque.  Please advise pt when they are in. Initial call taken by: Eugene Gavia,  March 03, 2009 3:38 PM  Follow-up for Phone Call        labs in computer for monday morning  ---pt is aware of labs in computer--- Randell Loop CMA  March 03, 2009 3:43 PM

## 2010-03-06 NOTE — Letter (Signed)
Summary: [facility]  [facility]   Imported By: Lennie Odor 11/01/2009 10:58:06  _____________________________________________________________________  External Attachment:    Type:   Image     Comment:   External Document

## 2010-03-06 NOTE — Progress Notes (Signed)
Summary: rx-anxiety  Phone Note Call from Patient Call back at [phone]   Caller: Gearlean Alf [facility] Call For: Kriste Basque Reason for Call: Talk to Nurse Summary of Call: Pt home from hospital, anxious about her condition and found an old rx of xanex, but Darl Pikes wouldn't let her take it(from '05).  Can she get a new RX for low dose xanex to take for anxiety? [facility] Initial call taken by: Eugene Gavia,  February 20, 2009 8:34 AM  Follow-up for Phone Call        Spoke with Darl Pikes.  She states that pt is having alot of anxiety since hospital d/c.  She found old rx for xanax 0.25 from 2005.  Nurse states that she advised her not to take being that rx had expired.  Pt is requesting new rx for xanax.  Please advise thanks Vernie Murders  February 20, 2009 8:40 AM   Additional Follow-up for Phone Call Additional follow up Details #1::        per SN---ok for pt to have xanax 0.25mg   1 by mouth three times a day as needed for nerves  #90   refill x 1.  thanks Randell Loop CMA  February 20, 2009 9:57 AM     Additional Follow-up for Phone Call Additional follow up Details #2::    Called and spoke with Darl Pikes and made aware that SN okayed the xanax rx and this was called to [facility] [address].  She will call and let pt know this was done. Follow-up by: Vernie Murders,  February 20, 2009 10:11 AM  New/Updated Medications: XANAX 0.25 MG TABS (ALPRAZOLAM) 1 by mouth three times a day as needed for anxiety Prescriptions: XANAX 0.25 MG TABS (ALPRAZOLAM) 1 by mouth three times a day as needed for anxiety  #90 x 1   Entered by:   Vernie Murders   Authorized by:   Michele Mcalpine MD   Signed by:   Vernie Murders on 02/20/2009   Method used:   Telephoned to ...       [facility]  [address] # [id number]* (retail)       [address]       [place], [place]  [address]       Ph: [phone] or [phone]       Fax: [phone]   RxID:   [id number]

## 2010-03-06 NOTE — Progress Notes (Signed)
Summary: appointment  Phone Note Call from Patient Call back at [phone]   Caller: Patient Call For: parrett Summary of Call: pt would like to get medrol shot for breathing today. also having body aches she has stopped taking livalo for cholestrol Initial call taken by: Rickard Patience,  May 24, 2009 10:08 AM  Follow-up for Phone Call        Pt c/o increased SOB and dry cough x 2 days. She is requesting to come in for depo injection. She is leaving for a trip on Friday and wants to be better before then. She is requesting to come in the afternoon. I advised we could get her in to see TP tomorrow AM, but she wanted to ask SN forst because she prefers to see him this pm.  Please advise.   She also states she was told by her surgeon to stop Livalo because she was c/o body aches. She has been off this med x 1 month. Carron Curie CMA  May 24, 2009 10:26 AM   Additional Follow-up for Phone Call Additional follow up Details #1::        ok per SN to stop the livalo----does she want a shot or does she want to be seen? Randell Loop CMA  May 24, 2009 12:54 PM     Additional Follow-up for Phone Call Additional follow up Details #2::    Spoke with pt.  She states that she prefers appt with SN if this is possible either this pm or tommorrow am.  Please advise, thanks Vernie Murders  May 24, 2009 1:39 PM   called and spoke with pt and she is aware of appt made for her to see SN in the am---she stated that she has appt tomorrow afternoon and is unable to come in then.  Randell Loop CMA  May 24, 2009 3:48 PM

## 2010-03-06 NOTE — Progress Notes (Signed)
Summary: talk to nurse  Phone Note Call from Patient Call back at Home Phone [phone]   Caller: Patient Call For: nadel Summary of Call: needs to talk to [facility] about her recent lung cancer surgery. Initial call taken by: Darletta Moll,  February 17, 2009 8:55 AM  Follow-up for Phone Call        called, spoke with pt.  Pt states she was calling Leigh to schedule an appt with SN beacuse she just got outof the hospital and needed a sooner appt than what SN had available but states the scheduler called her back with an appt.  States the reason she was calling [name] has been taken care of.  Will sign off on this note.  Follow-up by: Gweneth Dimitri RN,  February 17, 2009 9:41 AM

## 2010-03-06 NOTE — Miscellaneous (Signed)
Summary: [facility]  [facility]   Imported By: Lester Meriden 11/14/2009 08:34:57  _____________________________________________________________________  External Attachment:    Type:   Image     Comment:   External Document

## 2010-03-07 ENCOUNTER — Telehealth (INDEPENDENT_AMBULATORY_CARE_PROVIDER_SITE_OTHER): Payer: Self-pay | Admitting: *Deleted

## 2010-03-08 NOTE — Consult Note (Signed)
Summary: [facility]  [facility]   Imported By: Sherian Rein 02/20/2010 15:39:25  _____________________________________________________________________  External Attachment:    Type:   Image     Comment:   External Document

## 2010-03-08 NOTE — Progress Notes (Signed)
Summary: cough, congestion  Phone Note Call from Patient Call back at Home Phone [phone]   Caller: Patient Call For: nadel Summary of Call: Pt c/o of coughing up green sputum, congestion since yesterday morning, also states that she's broken out in red whelps from her fanny to her knees since yesterday morning thinks she's allergic to something, also wants to speak to leigh about her sinus attacks.//[facility] [address] Initial call taken by: Darletta Moll,  January 23, 2010 9:05 AM  Follow-up for Phone Call        bronchitis.  prod cough with green mucus, wheezing, increased SOB, fever x1day.  also states has red whelps from buttock to knees that itch x2days > states benadryl helps - only new thing in diet was "a couple of oatmeal cookies."  would like to know if SN has received anything from her eye doctor about "cluster HA's" and would like recs re: this.  [facility] [address]  Allergies:  1)  ! Codeine 2)  ! Epinephrine 3)  ! Morphine 4)  ! Erythromycin Follow-up by: Boone Master CNA/MA,  January 23, 2010 11:00 AM  Additional Follow-up for Phone Call Additional follow up Details #1::        SN recs are for avelox 400mg    #7  1 by mouth once daily unil gone----he has not recevied any papers from eye doctor about the cluster ha's   but he ususally refers to neuro for cluster headaches. so we can refer her to neuro for eval of this.  thanks Randell Loop CMA  January 23, 2010 12:10 PM     Additional Follow-up for Phone Call Additional follow up Details #2::    Spoke with pt and made aware of recs per SN.  Pt verbalized understandnding.  She states that she has already been set up to see neuro and will keep this appt.  Avelox was sent to pharm. Follow-up by: Vernie Murders,  January 23, 2010 12:27 PM  New/Updated Medications: AVELOX 400 MG TABS (MOXIFLOXACIN HCL) 1 by mouth once daily until gone Prescriptions: AVELOX 400 MG TABS (MOXIFLOXACIN HCL) 1 by mouth once daily until  gone  #7 x 0   Entered by:   Vernie Murders   Authorized by:   Michele Mcalpine MD   Signed by:   Vernie Murders on 01/23/2010   Method used:   Electronically to        [facility]  [address] # [id number]* (retail)       [address]       [place], [place]  [address]       Ph: [phone] or [phone]       Fax: [phone]   RxID:   [id number]

## 2010-03-09 NOTE — Letter (Signed)
Summary: [facility]  [facility]   Imported By: Sherian Rein 02/06/2009 09:48:46  _____________________________________________________________________  External Attachment:    Type:   Image     Comment:   External Document

## 2010-03-09 NOTE — Letter (Signed)
Summary: [facility]   Imported By: Lester Crisman 02/01/2010 08:48:53  _____________________________________________________________________  External Attachment:    Type:   Image     Comment:   External Document

## 2010-03-14 NOTE — Progress Notes (Signed)
Summary: speak to nurse  Phone Note Call from Patient Call back at Home Phone [phone] Call back at [phone]   Caller: Patient Call For: nadel Summary of Call: Pt states that [place] and that [place] will be [place] in [place] also going to [place] wants to know if this will be a problem with her breathing pls advise. Initial call taken by: Darletta Moll,  March 07, 2010 10:23 AM  Follow-up for Phone Call        called and spoke with pt. pt states she will be going to [place] soon to stay with some friends and the location is 3600 [place] in elevation.  She then will travel to [place] [facility] which is 5000 ft in elevation.  Pt wanted to check with SN to make sure she is ok to travel.  Pt states she overall feels ok- has some mild increased sob with exertion and a "deep non-productive cough" which she has had for "months."  Denies tightness in chest.  Will forward message to SN to address.  Aundra Millet Reynolds LPN  March 07, 2010 10:45 AM   Additional Follow-up for Phone Call Additional follow up Details #1::        per SN----unknown----it certainly could be a problem esp if exertion/exercise is required...take all of her meds as directed.  thanks Randell Loop CMA  March 07, 2010 11:11 AM     Additional Follow-up for Phone Call Additional follow up Details #2::    Pt informed of Dr Jodelle Green recommendations. Abigail Miyamoto RN  March 07, 2010 11:47 AM

## 2010-03-30 ENCOUNTER — Ambulatory Visit (INDEPENDENT_AMBULATORY_CARE_PROVIDER_SITE_OTHER): Payer: MEDICARE | Admitting: Adult Health

## 2010-03-30 ENCOUNTER — Encounter: Payer: Self-pay | Admitting: Adult Health

## 2010-03-30 DIAGNOSIS — N644 Mastodynia: Secondary | ICD-10-CM

## 2010-03-30 DIAGNOSIS — M1990 Unspecified osteoarthritis, unspecified site: Secondary | ICD-10-CM

## 2010-03-30 DIAGNOSIS — L03039 Cellulitis of unspecified toe: Secondary | ICD-10-CM

## 2010-04-03 ENCOUNTER — Telehealth (INDEPENDENT_AMBULATORY_CARE_PROVIDER_SITE_OTHER): Payer: Self-pay | Admitting: *Deleted

## 2010-04-09 ENCOUNTER — Other Ambulatory Visit: Payer: MEDICARE

## 2010-04-09 ENCOUNTER — Encounter (INDEPENDENT_AMBULATORY_CARE_PROVIDER_SITE_OTHER): Payer: Self-pay | Admitting: *Deleted

## 2010-04-09 ENCOUNTER — Other Ambulatory Visit: Payer: Self-pay | Admitting: Pulmonary Disease

## 2010-04-09 ENCOUNTER — Encounter: Payer: Self-pay | Admitting: Pulmonary Disease

## 2010-04-09 DIAGNOSIS — E7800 Pure hypercholesterolemia, unspecified: Secondary | ICD-10-CM

## 2010-04-09 DIAGNOSIS — R748 Abnormal levels of other serum enzymes: Secondary | ICD-10-CM

## 2010-04-09 DIAGNOSIS — D649 Anemia, unspecified: Secondary | ICD-10-CM

## 2010-04-09 DIAGNOSIS — E119 Type 2 diabetes mellitus without complications: Secondary | ICD-10-CM

## 2010-04-09 DIAGNOSIS — I10 Essential (primary) hypertension: Secondary | ICD-10-CM

## 2010-04-09 DIAGNOSIS — E785 Hyperlipidemia, unspecified: Secondary | ICD-10-CM

## 2010-04-09 DIAGNOSIS — E039 Hypothyroidism, unspecified: Secondary | ICD-10-CM

## 2010-04-09 DIAGNOSIS — N3000 Acute cystitis without hematuria: Secondary | ICD-10-CM

## 2010-04-09 LAB — URINALYSIS
Bilirubin Urine: NEGATIVE
Ketones, ur: NEGATIVE
Leukocytes, UA: NEGATIVE
Nitrite: NEGATIVE
Specific Gravity, Urine: 1.025 (ref 1.000–1.030)
Urobilinogen, UA: 0.2 (ref 0.0–1.0)

## 2010-04-09 LAB — BASIC METABOLIC PANEL: CO2: 27 mEq/L (ref 19–32)

## 2010-04-09 LAB — CBC WITH DIFFERENTIAL/PLATELET
Basophils Relative: 0.6 % (ref 0.0–3.0)
Eosinophils Absolute: 0.2 10*3/uL (ref 0.0–0.7)
HCT: 43.3 % (ref 36.0–46.0)
Hemoglobin: 15.2 g/dL — ABNORMAL HIGH (ref 12.0–15.0)
Lymphocytes Relative: 29 % (ref 12.0–46.0)
Lymphs Abs: 2.3 10*3/uL (ref 0.7–4.0)
MCHC: 35 g/dL (ref 30.0–36.0)
MCV: 95.9 fl (ref 78.0–100.0)
Monocytes Absolute: 0.4 10*3/uL (ref 0.1–1.0)
Neutro Abs: 5 10*3/uL (ref 1.4–7.7)
RBC: 4.52 Mil/uL (ref 3.87–5.11)

## 2010-04-09 LAB — LIPID PANEL
Total CHOL/HDL Ratio: 5
Triglycerides: 181 mg/dL — ABNORMAL HIGH (ref 0.0–149.0)

## 2010-04-09 LAB — HEPATIC FUNCTION PANEL
Alkaline Phosphatase: 34 U/L — ABNORMAL LOW (ref 39–117)
Bilirubin, Direct: 0.1 mg/dL (ref 0.0–0.3)
Total Bilirubin: 0.8 mg/dL (ref 0.3–1.2)

## 2010-04-09 LAB — HEMOGLOBIN A1C: Hgb A1c MFr Bld: 6.8 % — ABNORMAL HIGH (ref 4.6–6.5)

## 2010-04-10 ENCOUNTER — Ambulatory Visit (HOSPITAL_COMMUNITY)
Admission: RE | Admit: 2010-04-10 | Discharge: 2010-04-10 | Disposition: A | Discharge status: Home / Self Care | Payer: MEDICARE | Source: Ambulatory Visit | Attending: Internal Medicine | Admitting: Internal Medicine

## 2010-04-10 DIAGNOSIS — C3490 Malignant neoplasm of unspecified part of unspecified bronchus or lung: Secondary | ICD-10-CM

## 2010-04-10 DIAGNOSIS — E041 Nontoxic single thyroid nodule: Secondary | ICD-10-CM | POA: Insufficient documentation

## 2010-04-10 DIAGNOSIS — Z8551 Personal history of malignant neoplasm of bladder: Secondary | ICD-10-CM | POA: Insufficient documentation

## 2010-04-10 DIAGNOSIS — J4489 Other specified chronic obstructive pulmonary disease: Secondary | ICD-10-CM | POA: Insufficient documentation

## 2010-04-10 DIAGNOSIS — K7689 Other specified diseases of liver: Secondary | ICD-10-CM | POA: Insufficient documentation

## 2010-04-10 DIAGNOSIS — J449 Chronic obstructive pulmonary disease, unspecified: Secondary | ICD-10-CM | POA: Insufficient documentation

## 2010-04-10 DIAGNOSIS — J984 Other disorders of lung: Secondary | ICD-10-CM | POA: Insufficient documentation

## 2010-04-10 MED ORDER — IOHEXOL 300 MG/ML  SOLN
80.0000 mL | Freq: Once | INTRAMUSCULAR | Status: AC | PRN
  Administered 2010-04-10: 80 mL via INTRAVENOUS

## 2010-04-12 ENCOUNTER — Ambulatory Visit (INDEPENDENT_AMBULATORY_CARE_PROVIDER_SITE_OTHER): Payer: MEDICARE | Admitting: Pulmonary Disease

## 2010-04-12 ENCOUNTER — Encounter: Payer: Self-pay | Admitting: Pulmonary Disease

## 2010-04-12 DIAGNOSIS — E785 Hyperlipidemia, unspecified: Secondary | ICD-10-CM

## 2010-04-12 DIAGNOSIS — I872 Venous insufficiency (chronic) (peripheral): Secondary | ICD-10-CM

## 2010-04-12 DIAGNOSIS — J449 Chronic obstructive pulmonary disease, unspecified: Secondary | ICD-10-CM

## 2010-04-12 DIAGNOSIS — C3490 Malignant neoplasm of unspecified part of unspecified bronchus or lung: Secondary | ICD-10-CM

## 2010-04-12 DIAGNOSIS — I10 Essential (primary) hypertension: Secondary | ICD-10-CM

## 2010-04-12 DIAGNOSIS — I739 Peripheral vascular disease, unspecified: Secondary | ICD-10-CM

## 2010-04-12 DIAGNOSIS — I4891 Unspecified atrial fibrillation: Secondary | ICD-10-CM

## 2010-04-12 DIAGNOSIS — J45909 Unspecified asthma, uncomplicated: Secondary | ICD-10-CM

## 2010-04-12 LAB — CONVERTED CEMR LAB: Vit D, 25-Hydroxy: 24 ng/mL — ABNORMAL LOW (ref 30–89)

## 2010-04-12 NOTE — Progress Notes (Signed)
Summary: speak to nurse re: labs  Phone Note Call from Patient Call back at Home Phone [phone]   Caller: Patient Call For: nadel Summary of Call: Wants to talk to nurse in ref to her labs. Initial call taken by: Darletta Moll,  April 03, 2010 12:27 PM  Follow-up for Phone Call        Called, spoke with pt.  States she will be seen at the [facility] for [facility] on March 6.  She has pending lab appts here on March 5 and requesting cbc and cmet to be added to these labss per Cancer center request.  She would like the results of labs to be faxed to  [phone] Bobbie Stack and  [phone] Attn Amy.  bmet 401.9, hepatic 790.5, cbcd 285.9, tsh 244.9, a1c 250.02, lipid 272.0, vit d 733.00, udip only 595  -- these are pending labs for March 5th.  Dr. Kriste Basque, pls advise if ok to add cmet to these.  Thanks! Follow-up by: Gweneth Dimitri RN,  April 03, 2010 2:58 PM  Additional Follow-up for Phone Call Additional follow up Details #1::        per SN: cmet = hepatic and bmet which are already on our list of pending labs.  cbcd already ordered.  we will be happy to fax these results to them.  called spoke with patient, advised of SN recs as stated above.  pt okay with this and verbalized her understanding.  b/c pt has labs on 3.5.12 and her CT is 3.6.12 i advised pt to call the afternoon of her labs to make sure that the lab results are viewed and faxed in time for the CT.  pt verbalized her understanding. Additional Follow-up by: Boone Master CNA/MA,  April 03, 2010 5:14 PM

## 2010-04-12 NOTE — Assessment & Plan Note (Signed)
Summary: Acute NP office visit - infection right great toe   CC:  right great toe pain, redness, warm to touch.  states received pedicure 3weeks ago, and and soon after the redness began.  black area on toenail w/ some pain x "several months".  History of Present Illness: 75 y/o WF with known history of COPD- 7/09= FVC=1.93 (72%), FEV1=1.04 (49%), %1sec=54, mid-flows=19%pred... prev. 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  March 30, 2010 --Presents for a work in visit. Pt has several issues to discuss today. First she complains of right great toe, redness, warm to touch. She noticed redness along cuticle for last month then got a  pedicure 3weeks ago, and soon after the redness got worse. The toenail has had .  black area on toenail w/ some pain x "several months". Very tender  to touch. NO fever or drainage.  She has recently been evaluated by headache clinic and dx with cluster headaches. Started on imitrex injections but wants to switch to tablets. Says they work but does not the shots. Encouraged her to discuss this with the headahce clinic however she does not want to go back there. She also wants something for her arthritis pain has lots of aches in joints esp shoulders. -comes and goes. Some stiffness. no redness or swelling Also noticed tenderness along left  breast. Last mammogram 11/1 with no findings.      Medications Prior to Update: 1)  Advair Diskus 500-50 Mcg/dose Aepb (Fluticasone-Salmeterol) .... Use One Puff Two Times A Day 2)  Spiriva Handihaler 18 Mcg  Caps (Tiotropium Bromide Monohydrate) .... Inhale One Capsule By Mouth Every Day 3)  Proventil Hfa 108 (90 Base) Mcg/act Aers (Albuterol Sulfate) .[name].. 1-2 Sprays Up To Every 4h As Needed For Wheezing... 4)  Adult Aspirin Low Strength 81 Mg  Tbdp (Aspirin) .... Take 1 Tablet By Mouth Once A Day 5)  Verapamil Hcl Cr 240 Mg  Xr24h-Cap (Verapamil Hcl) .... Take 1 Cap By Mouth Once Daily.Marland KitchenMarland Kitchen 6)  Diovan Hct 160-12.5 Mg  Tabs (Valsartan-Hydrochlorothiazide) .... Take 1 Tablet By Mouth Once A Day 7)  Furosemide 20 Mg  Tabs (Furosemide) .... Take 1 Tab By Mouth Once Daily As Needed For Swelling... 8)  Fenoglide 120 Mg  Tabs (Fenofibrate) .... Take 1 Tab By Mouth Once Daily.Marland KitchenMarland Kitchen 9)  Fish Oil Maximum Strength 1200 Mg Caps (Omega-3 Fatty Acids) .... Take 1 Cap By Mouth Once Daily... 10)  Coq10 100 Mg Caps (Coenzyme Q10) .... Take 1 Tablet By Mouth Once A Day 11)  Metformin Hcl 500 Mg Tabs (Metformin Hcl) .... Take 1 Tablet By Mouthin The Am... 12)  Actonel 150 Mg Tabs (Risedronate Sodium) .... Take 1 Tab By Mouth Each Month... 13)  Caltrate 600+d 600-400 Mg-Unit  Tabs (Calcium Carbonate-Vitamin D) .... Take 1 Tablet By Mouth Once A Day 14)  Multivitamins   Tabs (Multiple Vitamin) .... Take 1 Tablet By Mouth Once A Day 15)  Vitamin D3 2000 Unit Caps (Cholecalciferol) .... Take 1 Cap By Mouth Once Daily... 16)  Tylenol Arthritis Pain 650 Mg Cr-Tabs (Acetaminophen) .... Take 2 Tablets By Mouth Once Daily 17)  Diphenhydramine Hcl 25 Mg Tabs (Diphenhydramine Hcl) .... As Needed For Allergies 18)  Augmentin 875-125 Mg Tabs (Amoxicillin-Pot Clavulanate) .... Take 1 Tab By Mouth Two Times A Day Til Gone... 19)  Prednisone 20 Mg Tabs (Prednisone) .... Take 1/2 To 1 Tab Daily As  Needed 20)  Avelox 400 Mg Tabs (Moxifloxacin Hcl) .[name].. 1 By Mouth Once Daily Until Gone  Current Medications (verified): 1)  Advair Diskus 500-50 Mcg/dose Aepb (Fluticasone-Salmeterol) .... Use One Puff Two Times A Day 2)  Spiriva Handihaler 18 Mcg  Caps (Tiotropium Bromide Monohydrate) .... Inhale One Capsule By Mouth Every Day 3)  Proventil Hfa 108 (90 Base) Mcg/act Aers (Albuterol Sulfate) .[name].. 1-2 Sprays Up To Every 4h As Needed For Wheezing... 4)  Adult Aspirin Low Strength 81 Mg  Tbdp (Aspirin) .... Take 1 Tablet By Mouth Once A Day 5)  Verapamil Hcl Cr  240 Mg Xr24h-Cap (Verapamil Hcl) .... Take 1 Cap By Mouth Once Daily.Marland KitchenMarland Kitchen 6)  Diovan Hct 160-12.5 Mg  Tabs (Valsartan-Hydrochlorothiazide) .... Take 1 Tablet By Mouth Once A Day 7)  Furosemide 20 Mg  Tabs (Furosemide) .... Take 1 Tab By Mouth Once Daily As Needed For Swelling... 8)  Fenoglide 120 Mg  Tabs (Fenofibrate) .... Take 1 Tab By Mouth Once Daily.Marland KitchenMarland Kitchen 9)  Fish Oil Maximum Strength 1200 Mg Caps (Omega-3 Fatty Acids) .... Take 1 Cap By Mouth Once Daily... 10)  Coq10 100 Mg Caps (Coenzyme Q10) .... Take 1 Tablet By Mouth Once A Day 11)  Metformin Hcl 500 Mg Tabs (Metformin Hcl) .... Take 1 Tablet By Mouthin The Am... 12)  Actonel 150 Mg Tabs (Risedronate Sodium) .... Take 1 Tab By Mouth Each Month... 13)  Caltrate 600+d 600-400 Mg-Unit  Tabs (Calcium Carbonate-Vitamin D) .... Take 1 Tablet By Mouth Once A Day 14)  Multivitamins   Tabs (Multiple Vitamin) .... Take 1 Tablet By Mouth Once A Day 15)  Vitamin D3 2000 Unit Caps (Cholecalciferol) .... Take 1 Cap By Mouth Once Daily... 16)  Tylenol Arthritis Pain 650 Mg Cr-Tabs (Acetaminophen) .... Take 2 Tablets By Mouth Once Daily 17)  Diphenhydramine Hcl 25 Mg Tabs (Diphenhydramine Hcl) .... As Needed For Allergies 18)  Imodium A-D 2 Mg  Tabs (Loperamide Hcl) 19)  Sumatriptan Succinate 6 Mg/0.48ml Soln (Sumatriptan Succinate) .... As Needed For Cluster Headaches 20)  Vitamin B-12 1000 Mcg Tabs (Cyanocobalamin) .... Take 1 Tablet By Mouth Once A Day  Allergies (verified): 1)  ! Codeine 2)  ! Epinephrine 3)  ! Morphine 4)  ! 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) OBESITY (ICD-278.00) GERD  (ICD-530.81) NEPHROLITHIASIS, HX OF (ICD-V13.01) Hx of BLADDER CANCER (ICD-188.9) DEGENERATIVE JOINT DISEASE (ICD-715.90) OSTEOPENIA (ICD-733.90) ANXIETY (ICD-300.00)  Past Surgical History: Last updated: 12/25/2009 S/P right rotator cuff repair by DrPresson 1991 S/P TURBT 10/01by DrPeterson S/P left TKR by DrGioffre 2002 S/P right upper lobectomy for lung cancer 11/05 by DrBurney (bronchoalveolar cell ca) S/P left upper lobectomy & node dissection 1/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) S/P D&C for removal of endometrial polyp (benign) 12/10 by GYN  Family History: Last updated: Jun 08, 2008 mother died 24 with copd heat failure father died 44 mi and ulcers 5 Siblings: 1 brother died from cancer, allergies 1 brother died at 87 with cancer, heart disease, asthma 1 sister has emphysema, asthma 1 sister has heart disease, allergies 1 sister has rheumatism, allergies  Social History: Last updated: 09/25/2009 Married, husb= Darcell Yacoub 1 child ex-smoker, quit 1999 social alcohol w/ wine daily retired  Review of Systems      See HPI  Vital Signs:  Patient profile:   76 year old female Height:      62 inches Weight:      195 pounds BMI:     35.79 O2 Sat:      95 % on Room air Temp:     97.0 degrees F oral Pulse rate:   112 / minute BP sitting:   122 / 62  (left arm) Cuff size:   regular  Vitals Entered By: Boone Master CNA/MA (March 30, 2010 9:37 AM)  O2 Flow:  Room air CC: right great toe pain, redness, warm to touch.  states received pedicure 3weeks ago, and soon after the redness began.  black area on toenail w/ some pain x "several months" Is Patient Diabetic? Yes Comments Medications reviewed with patient Daytime contact number verified with patient. Boone Master CNA/MA  March 30, 2010 9:37 AM    Physical Exam  Additional Exam:  WD, WN, 76 y/o  WF in NAD... GENERAL:  Alert & oriented; pleasant & cooperative... HEENT:  [place]/AT,  EACs-clear, TMs-wnl, NOSE-clear, THROAT-clear & wnl NECK:  Supple w/ fair ROM; no JVD; normal carotid impulses w/o bruits; no thyromegaly or nodules palpated; no lymphadenopathy. CHEST:  Decr BS bilat; clear to P & A; few exp wheezes no rales/ or rhonchi, s/p VATS surg scar... Breast: Large pendulous breast , tendrness along bilatial breast, increased on left along 4 FB at 0600, no dippling or dishcarge noted.  no palpable masses noted and no adenopathy palpated.  HEART:  Regular Rhythm; without murmurs/ rubs/ or gallops. ABDOMEN:  Obese, soft & nontender; normal bowel sounds; no organomegaly or masses detected. EXT:  S/P left TKR; mild arthritic changes; no varicose veins/ +venous insuffic/ tr edema. no joint deformity noted.   Left great toe with discolored nail bed, cuticle red swollen and tender no drainage noted    Impression & Recommendations:  Problem # 1:  ONYCHIA AND PARONYCHIA OF TOE (ICD-681.11)  Warm epson salt soaks, clean with soap and water, pat dry. Elevate foot. Keflex 500mg  four times a day for 10 days We are referring you to Podiatry to exam toenail.  follow up 2weeks Dr. Kriste Basque as planned. Tramadol 50mg  every 4-6 hr as needed pain  Please contact office for sooner follow up as needed  refer to podiatry    The following medications were removed from the medication list:    Augmentin 875-125 Mg Tabs (Amoxicillin-pot clavulanate) .[name]... Take 1 tab by mouth two times a day til gone...    Avelox 400 Mg Tabs (Moxifloxacin hcl) .[name]... 1 by mouth once daily until gone Her updated medication list for this problem includes:    Keflex 500 Mg Caps (Cephalexin) .[name]... 1 by mouth four times a day  Orders: Est. Patient Level IV (95621)  Problem # 2:  BREAST PAIN, LEFT (ICD-611.71) exam unrevealing recent mammogram neg cont to follow ,  Please contact office for sooner follow up if symptoms do not improve or worsen  Orders: Est. Patient Level IV (30865)  Problem # 3:   DEGENERATIVE JOINT DISEASE (ICD-715.90)   warm heat , stretches . Tramadol 50mg  every 4-6 hr as needed pain  Please contact office for sooner follow up if sympto Her updated medication list for this problem includes:    Adult Aspirin Low Strength 81 Mg Tbdp (Aspirin) .[name]... Take 1 tablet by mouth once a day    Tylenol Arthritis Pain 650 Mg Cr-tabs (Acetaminophen) .[name]... Take 2 tablets by mouth once daily    Tramadol Hcl  50 Mg Tabs (Tramadol hcl) .[name]... 1 by mouth every 4-6 as needed for pain  Orders: Est. Patient Level IV (16109)  Medications Added to Medication List This Visit: 1)  Imodium A-d 2 Mg Tabs (Loperamide hcl) 2)  Sumatriptan Succinate 6 Mg/0.38ml Soln (Sumatriptan succinate) .... As needed for cluster headaches 3)  Vitamin B-12 1000 Mcg Tabs (Cyanocobalamin) .... Take 1 tablet by mouth once a day 4)  Keflex 500 Mg Caps (Cephalexin) .[name].. 1 by mouth four times a day 5)  Imitrex 50 Mg Tabs (Sumatriptan succinate) .[name].. 1 by mouth at onset of headache 6)  Tramadol Hcl 50 Mg Tabs (Tramadol hcl) .[name].. 1 by mouth every 4-6 as needed for pain  Other Orders: Podiatry Referral (Podiatry)  Patient Instructions: 1)  Warm epson salt soaks, clean with soap and water, pat dry. 2)  Elevate foot. 3)  Keflex 500mg  four times a day for 10 days 4)  We are referring you to Podiatry to exam toenail.  5)  follow up 2weeks Dr. Kriste Basque as planned. 6)  Tramadol 50mg  every 4-6 hr as needed pain  7)  Please contact office for sooner follow up if symptoms do not improve or worsen  Prescriptions: TRAMADOL HCL 50 MG TABS (TRAMADOL HCL) 1 by mouth every 4-6 as needed for pain  #30 x 1   Entered by:   Vernie Murders   Authorized by:   Rubye Oaks NP   Signed by:   Vernie Murders on 03/30/2010   Method used:   Telephoned to ...       [facility]  [address] [id number]* (retail)       [address]       [place], [place]  [address]       Ph: [phone] or [phone]       Fax:  [phone]   RxID:   [id number] IMITREX 50 MG TABS (SUMATRIPTAN SUCCINATE) 1 by mouth at onset of headache  #10 x 2   Entered and Authorized by:   Rubye Oaks NP   Signed by:   Vernie Murders on 03/30/2010   Method used:   Electronically to        [facility] [address] [id number]* (retail)       [address]       [place], [place]  [address]       Ph: [phone] or [phone]       Fax: [phone]   RxID:   [id number] KEFLEX 500 MG CAPS (CEPHALEXIN) 1 by mouth four times a day  #40 x 0   Entered and Authorized by:   Rubye Oaks NP   Signed by:   Vila Dory NP on 03/30/2010   Method used:   Electronically to        [facility] [address] [id number]* (retail)       [address]       [place], [place]  [address]       Ph: [phone] or [phone]       Fax: [phone]   RxID:   [id number]

## 2010-04-19 ENCOUNTER — Other Ambulatory Visit: Payer: Self-pay | Admitting: Internal Medicine

## 2010-04-19 ENCOUNTER — Encounter: Payer: MEDICARE | Admitting: Internal Medicine

## 2010-04-19 DIAGNOSIS — C3490 Malignant neoplasm of unspecified part of unspecified bronchus or lung: Secondary | ICD-10-CM

## 2010-04-20 ENCOUNTER — Other Ambulatory Visit (HOSPITAL_COMMUNITY): Payer: Self-pay

## 2010-04-22 LAB — BASIC METABOLIC PANEL
BUN: 12 mg/dL (ref 6–23)
BUN: 15 mg/dL (ref 6–23)
BUN: 16 mg/dL (ref 6–23)
BUN: 20 mg/dL (ref 6–23)
BUN: 21 mg/dL (ref 6–23)
CO2: 26 mEq/L (ref 19–32)
CO2: 28 mEq/L (ref 19–32)
CO2: 29 mEq/L (ref 19–32)
Calcium: 8.3 mg/dL — ABNORMAL LOW (ref 8.4–10.5)
Calcium: 8.5 mg/dL (ref 8.4–10.5)
Calcium: 9.2 mg/dL (ref 8.4–10.5)
Calcium: 9.3 mg/dL (ref 8.4–10.5)
Chloride: 100 mEq/L (ref 96–112)
Chloride: 100 mEq/L (ref 96–112)
Chloride: 103 mEq/L (ref 96–112)
Creatinine, Ser: 0.55 mg/dL (ref 0.4–1.2)
Creatinine, Ser: 0.63 mg/dL (ref 0.4–1.2)
Creatinine, Ser: 0.7 mg/dL (ref 0.4–1.2)
Creatinine, Ser: 0.87 mg/dL (ref 0.4–1.2)
GFR calc Af Amer: >60 mL/min (ref >60)
GFR calc Af Amer: >60 mL/min (ref >60)
GFR calc Af Amer: >60 mL/min (ref >60)
GFR calc Af Amer: >60 mL/min (ref >60)
GFR calc Af Amer: >60 mL/min (ref >60)
GFR calc non Af Amer: >60 mL/min (ref >60)
GFR calc non Af Amer: >60 mL/min (ref >60)
GFR calc non Af Amer: >60 mL/min (ref >60)
GFR calc non Af Amer: >60 mL/min (ref >60)
Glucose, Bld: 102 mg/dL — ABNORMAL HIGH (ref 70–99)
Glucose, Bld: 110 mg/dL — ABNORMAL HIGH (ref 70–99)
Glucose, Bld: 149 mg/dL — ABNORMAL HIGH (ref 70–99)
Glucose, Bld: 174 mg/dL — ABNORMAL HIGH (ref 70–99)
Potassium: 3.7 mEq/L (ref 3.5–5.1)
Potassium: 4 mEq/L (ref 3.5–5.1)
Potassium: 4.1 mEq/L (ref 3.5–5.1)
Potassium: 4.2 mEq/L (ref 3.5–5.1)
Sodium: 132 mEq/L — ABNORMAL LOW (ref 135–145)
Sodium: 135 mEq/L (ref 135–145)
Sodium: 136 mEq/L (ref 135–145)
Sodium: 140 mEq/L (ref 135–145)

## 2010-04-22 LAB — CBC
HCT: 33 % — ABNORMAL LOW (ref 36.0–46.0)
HCT: 35.9 % — ABNORMAL LOW (ref 36.0–46.0)
HCT: 37.3 % (ref 36.0–46.0)
Hemoglobin: 11.3 g/dL — ABNORMAL LOW (ref 12.0–15.0)
Hemoglobin: 12.4 g/dL (ref 12.0–15.0)
MCHC: 34.4 g/dL (ref 30.0–36.0)
MCHC: 34.7 g/dL (ref 30.0–36.0)
MCV: 97.8 fL (ref 78.0–100.0)
MCV: 98.9 fL (ref 78.0–100.0)
Platelets: 214 10*3/uL (ref 150–400)
Platelets: 236 10*3/uL (ref 150–400)
Platelets: 273 10*3/uL (ref 150–400)
RBC: 3.35 MIL/uL — ABNORMAL LOW (ref 3.87–5.11)
RBC: 3.65 MIL/uL — ABNORMAL LOW (ref 3.87–5.11)
RBC: 3.69 MIL/uL — ABNORMAL LOW (ref 3.87–5.11)
RDW: 12.4 % (ref 11.5–15.5)
RDW: 12.6 % (ref 11.5–15.5)
RDW: 12.7 % (ref 11.5–15.5)
RDW: 12.9 % (ref 11.5–15.5)
WBC: 8.9 10*3/uL (ref 4.0–10.5)
WBC: 9.3 10*3/uL (ref 4.0–10.5)
WBC: 9.3 10*3/uL (ref 4.0–10.5)

## 2010-04-22 LAB — GLUCOSE, CAPILLARY
Glucose-Capillary: 101 mg/dL — ABNORMAL HIGH (ref 70–99)
Glucose-Capillary: 102 mg/dL — ABNORMAL HIGH (ref 70–99)
Glucose-Capillary: 109 mg/dL — ABNORMAL HIGH (ref 70–99)
Glucose-Capillary: 109 mg/dL — ABNORMAL HIGH (ref 70–99)
Glucose-Capillary: 119 mg/dL — ABNORMAL HIGH (ref 70–99)
Glucose-Capillary: 128 mg/dL — ABNORMAL HIGH (ref 70–99)
Glucose-Capillary: 131 mg/dL — ABNORMAL HIGH (ref 70–99)
Glucose-Capillary: 132 mg/dL — ABNORMAL HIGH (ref 70–99)
Glucose-Capillary: 137 mg/dL — ABNORMAL HIGH (ref 70–99)
Glucose-Capillary: 148 mg/dL — ABNORMAL HIGH (ref 70–99)
Glucose-Capillary: 151 mg/dL — ABNORMAL HIGH (ref 70–99)
Glucose-Capillary: 151 mg/dL — ABNORMAL HIGH (ref 70–99)
Glucose-Capillary: 160 mg/dL — ABNORMAL HIGH (ref 70–99)
Glucose-Capillary: 160 mg/dL — ABNORMAL HIGH (ref 70–99)
Glucose-Capillary: 170 mg/dL — ABNORMAL HIGH (ref 70–99)
Glucose-Capillary: 174 mg/dL — ABNORMAL HIGH (ref 70–99)
Glucose-Capillary: 180 mg/dL — ABNORMAL HIGH (ref 70–99)
Glucose-Capillary: 183 mg/dL — ABNORMAL HIGH (ref 70–99)
Glucose-Capillary: 98 mg/dL (ref 70–99)

## 2010-04-22 LAB — POCT I-STAT 3, ART BLOOD GAS (G3+)
Acid-Base Excess: 3 mmol/L — ABNORMAL HIGH (ref 0.0–2.0)
Acid-base deficit: 1 mmol/L (ref 0.0–2.0)
Bicarbonate: 24.4 mEq/L — ABNORMAL HIGH (ref 20.0–24.0)
Bicarbonate: 26.4 mEq/L — ABNORMAL HIGH (ref 20.0–24.0)
O2 Saturation: 92 %
Patient temperature: 97.5
Patient temperature: 97.7
Patient temperature: 98.3
TCO2: 26 mmol/L (ref 0–100)
TCO2: 28 mmol/L (ref 0–100)
pCO2 arterial: 44.7 mmHg (ref 35.0–45.0)
pH, Arterial: 7.422 — ABNORMAL HIGH (ref 7.350–7.400)
pO2, Arterial: 56 mmHg — ABNORMAL LOW (ref 80.0–100.0)
pO2, Arterial: 64 mmHg — ABNORMAL LOW (ref 80.0–100.0)
pO2, Arterial: 65 mmHg — ABNORMAL LOW (ref 80.0–100.0)
pO2, Arterial: 85 mmHg (ref 80.0–100.0)

## 2010-04-22 LAB — COMPREHENSIVE METABOLIC PANEL
Albumin: 2.8 g/dL — ABNORMAL LOW (ref 3.5–5.2)
BUN: 17 mg/dL (ref 6–23)
Calcium: 7.8 mg/dL — ABNORMAL LOW (ref 8.4–10.5)
Creatinine, Ser: 0.65 mg/dL (ref 0.4–1.2)
Total Protein: 6 g/dL (ref 6.0–8.3)

## 2010-04-22 LAB — BRAIN NATRIURETIC PEPTIDE
Pro B Natriuretic peptide (BNP): 108 pg/mL — ABNORMAL HIGH (ref 0.0–100.0)
Pro B Natriuretic peptide (BNP): 322 pg/mL — ABNORMAL HIGH (ref 0.0–100.0)

## 2010-04-22 LAB — CULTURE, RESPIRATORY W GRAM STAIN

## 2010-04-22 LAB — MRSA PCR SCREENING: MRSA by PCR: NEGATIVE

## 2010-05-03 NOTE — Assessment & Plan Note (Signed)
Summary: 87month follow up   CC:  4 month ROV & review of mult medical problems....  History of Present Illness: 76 y/o WF here for a follow up visit... she has mult medical specialists who follow all of her medical problems (see below)...   ~  September 25, 2009:  55mo ROV- c/o cough "at least 2 times per day" & tongue sore... she has had follow ups w/ her specialists:  DrRamos 5/11 for LBP to right leg & given epid steroid shot (improved)...  DrBurney 6/11 CXR stable & CT planned in Aug, she had some dypnea which he related to the amt of lung tissue resected...  DrGerkin 6/11 for bilat thyroid nodules w/ benign bx- f/u sonar w/ multinod goiter, no ch in nodules, TSH= WNL.[name].  she has f/u appt w/ DrMohammed in several weeks...   ~  December 25, 2009:  Add-on appt for "sinus"- c/o right eye problem "it's allergy" & notes red, swollen, right sided facial pain & HA;  notes drainage "it pours" mucus, congestion; hurts in her teeth down to her jaw, but denies fever, discolored phlegm or blood... 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  we discussed checking sinus XRays & treating her w/ Depo/ Pred, Augmentin, Mucinex >> if symptoms persist she will need Neuro f/u for ?atypic facial pain?   ~  April 12, 2010:      She notes mult somatic concerns> SOB- "it's my weight", Cluster HAs- improved off wine "hopefully they are gone for the next 36yr cycle", right great toenail infection surg by podiatry & finally improved but reactions to Kelflex & Septra w/ nausea & diarrhea...     She had CT 3/12 per DrMohamed> COPD & post surg changes, <1cm ground glass nodule LUL w/o change, stable/ NAD, hep steatosis;  they plan continued watchful waiting...    BP controlled on meds> 140/76 today & similar at home;  needs better diet & wt reduction to keep from having to incr her BP meds...    Chol looks fair on  Fenoglide + FishOil as she is intol to all statins> TChol 215, TG 181, HDL 40, LDL 157...    DM control is adeq on the Metformin alone>  Bs=140, A1c=6.8, but wt is up 11# to 197# & we reviewed low carb, low chol/fat diets...    Thyroid> TSH=1.08 (not on meds) & she reports being released by DrGerkins, no change x58yrs...    LABS 04/09/10 also shows Vit D still low at 24 ?on 2000u daily supplement> she prefers 50K weekly & we wrote Rx... we discussed Shingles vaccine for her at her convenience...  SEE PREV [facility] EMR notes for PROBLEM LIST details >>>    Preventive Screening-Counseling & Management  Alcohol-Tobacco     Smoking Status: quit     Year Started: 1956     Year Quit: 1996     Pack years: 3/4 ppd   Allergies: 1)  ! Codeine 2)  ! Epinephrine 3)  ! Morphine 4)  ! Erythromycin 5)  ! Septra Ds (Sulfamethoxazole-Trimethoprim) 6)  ! Keflex  Comments:  Nurse/Medical Assistant: The patient's medications and allergies were reviewed with the patient and were updated in the Medication and Allergy Lists.  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) OBESITY (ICD-278.00) GERD (ICD-530.81) NEPHROLITHIASIS, HX OF (ICD-V13.01) Hx of BLADDER CANCER (ICD-188.9) DEGENERATIVE JOINT DISEASE (ICD-715.90) OSTEOPENIA (ICD-733.90) ANXIETY (ICD-300.00)  Past Surgical History: S/P right rotator cuff repair by DrPresson 1991 S/P TURBT 10/01by DrPeterson S/P left TKR by DrGioffre 2002 S/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 mother died 2 with copd heat failure father died 30 mi and ulcers 5 Siblings: 1 brother died from cancer, allergies 1 brother died at 55 with cancer, heart disease, asthma 1 sister has emphysema, asthma 1 sister has heart disease, allergies 1 sister has rheumatism, allergies  Social History: Reviewed history from 09/25/2009 and no changes required. Married, husb= Edison Wollschlager 1 child ex-smoker, quit 1999 social alcohol w/ wine daily retired  Review of Systems      See HPI       The patient complains of dyspnea on exertion and headaches.  The patient denies anorexia, fever, weight loss, weight gain, vision loss, decreased hearing, hoarseness, chest pain, syncope, peripheral edema, prolonged cough, hemoptysis, abdominal pain, melena, hematochezia, severe indigestion/heartburn, hematuria, incontinence, muscle weakness, suspicious skin lesions, transient blindness, difficulty walking, depression, unusual weight change, abnormal bleeding, enlarged lymph nodes, and angioedema.    Vital Signs:  Patient profile:   76 year old female Height:      62 inches Weight:      196.50 pounds BMI:     36.07 O2 Sat:      97 % on Room air Temp:     97.4 degrees F oral Pulse rate:   103 / minute BP sitting:   140 / 76  (left arm) Cuff size:   regular  Vitals Entered By: Randell Loop CMA (April 12, 2010 11:45 AM)  O2 Sat at Rest %:  97 O2 Flow:  Room air CC: 4 month ROV & review of mult medical problems... Is Patient Diabetic? Yes Pain Assessment Patient in pain? yes      Comments meds updated today with pt   Physical Exam  Additional Exam:  WD, WN, 76 y/o  WF in NAD... GENERAL:  Alert & oriented; pleasant & cooperative... HEENT:  Gas/AT, EOM-wnl, PERRLA, EACs-clear, TMs-wnl, NOSE-clear, THROAT-clear & wnl. NECK:  Supple w/ fairROM; no JVD; normal carotid impulses  w/o bruits; no thyromegaly or nodules palpated; no lymphadenopathy. CHEST:  Decr BS bilat; scat wheezing/ rhonchi bilat, no rales or consolidation; s/p VATS surg scar on right & mini thoracotomy scar on left... sl tender to palp left chest wall... HEART:  Regular Rhythm; without murmurs/ rubs/ or gallops heard... ABDOMEN:  Obese w/ panniculus, soft & nontender; normal bowel sounds; no organomegaly or masses detected. EXT:  S/P left TKR; mild arthritic changes; no varicose veins/ +venous insuffic/ tr edema. NEURO:  CN's intact;  no focal neuro deficits... sl tender over right max sinus. DERM:  No lesions noted; no rash etc...    Impression & Recommendations:  Problem # 1:  COPD (ICD-496) She has chr dyspnea from her COPD, Asthma, prev lung surg... advised her to continue all meds + Mucinex for thick phlegm & exercise program (she declines pulm rehab)... Her updated medication list for this problem includes:    Advair Diskus 500-50 Mcg/dose  Aepb (Fluticasone-salmeterol) ..... Use one puff two times a day    Spiriva Handihaler 18 Mcg Caps (Tiotropium bromide monohydrate) ..... Inhale one capsule by mouth every day    Proventil Hfa 108 (90 Base) Mcg/act Aers (Albuterol sulfate) .[name]... 1-2 sprays up to every 4h as needed for wheezing...  Problem # 2:  Hx of LUNG CANCER (ICD-162.9) S/p 2 surgeries as noted>  foloowed by drMohammed & DrBurney;  recent CT w/ <1cm LUL GG nodule w/o change, no signif adenopathy, they continue watchful waiting protocol...  Problem # 3:  HYPERTENSION (ICD-401.9) Controlled on meds & she is reminded to take meds regularly & monitor BP at home... warned that if she doesn't get wt down that she will likely need more meds in the future... Her updated medication list for this problem includes:    Verapamil Hcl Cr 240 Mg Xr24h-cap (Verapamil hcl) .[name]... Take 1 cap by mouth once daily...    Diovan Hct 160-12.5 Mg Tabs (Valsartan-hydrochlorothiazide) .[name]... Take 1 tablet by mouth  once a day    Furosemide 20 Mg Tabs (Furosemide) .[name]... Take 1 tab by mouth once daily as needed for swelling...  Problem # 4:  HYPERLIPIDEMIA (ICD-272.4) TChol & esp LDL still elev but she is intol to all statins... offered LC but she declines. REC>  same meds, better diet, get wt down... Her updated medication list for this problem includes:    Fenoglide 120 Mg Tabs (Fenofibrate) .[name]... Take 1 tab by mouth once daily...  Problem # 5:  DIABETES MELLITUS (ICD-250.00) Good control on Metformin monotherapy... needs better diet & wt reduction!!! Her updated medication list for this problem includes:    Adult Aspirin Low Strength 81 Mg Tbdp (Aspirin) .[name]... Take 1 tablet by mouth once a day    Diovan Hct 160-12.5 Mg Tabs (Valsartan-hydrochlorothiazide) .[name]... Take 1 tablet by mouth once a day    Metformin Hcl 500 Mg Tabs (Metformin hcl) .[name]... Take 1 tablet by mouthin the am...  Problem # 6:  NONTOXIC MULTINODULAR GOITER (ICD-241.1) Stable>  no change in exam, & biochem euthyroid as well...  Problem # 7:  MULT MEDICAL PROBLEMS AS NOTED>>>  Complete Medication List: 1)  Advair Diskus 500-50 Mcg/dose Aepb (Fluticasone-salmeterol) .... Use one puff two times a day 2)  Spiriva Handihaler 18 Mcg Caps (Tiotropium bromide monohydrate) .... Inhale one capsule by mouth every day 3)  Proventil Hfa 108 (90 Base) Mcg/act Aers (Albuterol sulfate) .[name].. 1-2 sprays up to every 4h as needed for wheezing... 4)  Adult Aspirin Low Strength 81 Mg Tbdp (Aspirin) .... Take 1 tablet by mouth once a day 5)  Verapamil Hcl Cr 240 Mg Xr24h-cap (Verapamil hcl) .... Take 1 cap by mouth once daily.Marland KitchenMarland Kitchen 6)  Diovan Hct 160-12.5 Mg Tabs (Valsartan-hydrochlorothiazide) .... Take 1 tablet by mouth once a day 7)  Furosemide 20 Mg Tabs (Furosemide) .... Take 1 tab by mouth once daily as needed for swelling... 8)  Fenoglide 120 Mg Tabs (Fenofibrate) .... Take 1 tab by mouth once daily.Marland KitchenMarland Kitchen 9)  Fish Oil Maximum Strength 1200 Mg Caps  (Omega-3 fatty acids) .... Take 1 cap by mouth once daily... 10)  Metformin Hcl 500 Mg Tabs (Metformin hcl) .... Take 1 tablet by mouthin the am... 11)  Actonel 150 Mg Tabs (Risedronate sodium) .... Take 1 tab by mouth each month... 12)  Caltrate 600+d 600-400 Mg-unit Tabs (Calcium carbonate-vitamin d) .... Take 1 tablet by mouth once a day 13)  Multivitamins Tabs (Multiple vitamin) .... Take 1 tablet  by mouth once a day 14)  Vitamin B-12 1000 Mcg Tabs (Cyanocobalamin) .... Take 1 tablet by mouth once a day 15)  Vitamin D3 50000 Unit Caps (Cholecalciferol) .... Take 1 cap by mouth each week 16)  Tramadol Hcl 50 Mg Tabs (Tramadol hcl) .[name].. 1 by mouth every 4-6 as needed for pain 17)  Imitrex 50 Mg Tabs (Sumatriptan succinate) .[name].. 1 by mouth at onset of headache 18)  Sumatriptan Succinate 6 Mg/0.43ml Soln (Sumatriptan succinate) .... As needed for cluster headaches 19)  Diphenhydramine Hcl 25 Mg Tabs (Diphenhydramine hcl) .... As needed for allergies 20)  Shingles Vaccine  .... Admin one shingles shot please...  Patient Instructions: 1)  Today we updated your med list- see below.... 2)  We decided to start on perscription VITAMIN D Supplement >> 50,000 u once weekly.Marland KitchenMarland Kitchen 3)  We also wrote a perscription for a Shingles vaccination for your pharmacist as discussed.[name] 4)  Let's get on track w/ our diet & exercise program... the goal is to lose weight >> shoot for 15-20 lbs over the next 6months. 5)  Please schedule a follow-up appointment in 6 months, sooner as needed. Prescriptions: SHINGLES VACCINE Admin one shingles shot please...  #1 x 0   Entered and Authorized by:   Michele Mcalpine MD   Signed by:   Michele Mcalpine MD on 04/12/2010   Method used:   Print then Give to Patient   RxID:   [id number] VITAMIN D3 50000 UNIT CAPS (CHOLECALCIFEROL) take 1 cap by mouth each week  #4 x 12   Entered and Authorized by:   Michele Mcalpine MD   Signed by:   Michele Mcalpine MD on 04/12/2010   Method used:    Print then Give to Patient   RxID:   [id number]

## 2010-05-07 ENCOUNTER — Other Ambulatory Visit: Payer: Self-pay | Admitting: Pulmonary Disease

## 2010-05-07 LAB — TYPE AND SCREEN
ABO/RH(D): A POS
ABO/RH(D): A POS

## 2010-05-07 LAB — URINALYSIS, ROUTINE W REFLEX MICROSCOPIC
Glucose, UA: NEGATIVE mg/dL
Hgb urine dipstick: NEGATIVE
Ketones, ur: NEGATIVE mg/dL
Ketones, ur: NEGATIVE mg/dL
Nitrite: NEGATIVE
Protein, ur: NEGATIVE mg/dL
pH: 5.5 (ref 5.0–8.0)

## 2010-05-07 LAB — COMPREHENSIVE METABOLIC PANEL
ALT: 36 U/L — ABNORMAL HIGH (ref 0–35)
AST: 32 U/L (ref 0–37)
Alkaline Phosphatase: 28 U/L — ABNORMAL LOW (ref 39–117)
CO2: 23 mEq/L (ref 19–32)
Calcium: 10.1 mg/dL (ref 8.4–10.5)
Chloride: 106 mEq/L (ref 96–112)
GFR calc Af Amer: >60 mL/min (ref >60)
GFR calc non Af Amer: >60 mL/min (ref >60)
Potassium: 4.5 mEq/L (ref 3.5–5.1)
Sodium: 140 mEq/L (ref 135–145)
Total Bilirubin: 0.5 mg/dL (ref 0.3–1.2)

## 2010-05-07 LAB — BLOOD GAS, ARTERIAL
Drawn by: 206361
pCO2 arterial: 41.4 mmHg (ref 35.0–45.0)
pH, Arterial: 7.419 — ABNORMAL HIGH (ref 7.350–7.400)
pO2, Arterial: 68.4 mmHg — ABNORMAL LOW (ref 80.0–100.0)

## 2010-05-07 LAB — ABO/RH
ABO/RH(D): A POS
ABO/RH(D): A POS

## 2010-05-07 LAB — CBC
Hemoglobin: 14.8 g/dL (ref 12.0–15.0)
Platelets: 238 10*3/uL (ref 150–400)
RBC: 4.39 MIL/uL (ref 3.87–5.11)
WBC: 5.7 10*3/uL (ref 4.0–10.5)
WBC: 5.9 10*3/uL (ref 4.0–10.5)

## 2010-05-07 LAB — BASIC METABOLIC PANEL
BUN: 20 mg/dL (ref 6–23)
Creatinine, Ser: 0.62 mg/dL (ref 0.4–1.2)
GFR calc non Af Amer: >60 mL/min (ref >60)

## 2010-05-22 ENCOUNTER — Telehealth: Payer: Self-pay | Admitting: Pulmonary Disease

## 2010-05-22 MED ORDER — METAXALONE 800 MG PO TABS: 800.0000 mg | ORAL_TABLET | Freq: Three times a day (TID) | ORAL | Status: DC | PRN

## 2010-05-22 NOTE — Telephone Encounter (Signed)
Called and spoke with pt and she stated that she is having neck pain x 1 day--goes down into her shoulder and back--making her walk crooked.  Pt is requesting a muscle relaxer.  Please advise. thanks

## 2010-05-22 NOTE — Telephone Encounter (Signed)
Called and spoke with pt and she is aware of the rx called to the pharmacy.

## 2010-05-24 ENCOUNTER — Telehealth: Payer: Self-pay | Admitting: Pulmonary Disease

## 2010-05-24 MED ORDER — HYDROCODONE-ACETAMINOPHEN 5-500 MG PO TABS: ORAL_TABLET | ORAL | Status: DC

## 2010-05-24 NOTE — Telephone Encounter (Signed)
Per SN---she will need to let her surgeon know about this extreme pain--ortho? Neurosurgeon?   vicodin is the strongest we can give her--can call in vicodin #90  1 tablet by mouth three times daily refill x 2.  thanks

## 2010-05-24 NOTE — Telephone Encounter (Signed)
Pt states the Skelaxin is not working for her neck pain. She would like something else to help ease her pain. She says she has appt with ortho but they cannot see her until 5/1. Pls advise. Allergies  Allergen Reactions  . Cephalexin     REACTION: nausea and diarrhea  . Codeine     REACTION: nausea  . Epinephrine     REACTION: nervous  . Erythromycin     REACTION: all mycins cause yeast infections  . Morphine     REACTION: nausea  . Sulfamethoxazole W/Trimethoprim     REACTION: nausea and diarrhea

## 2010-05-24 NOTE — Telephone Encounter (Signed)
Pt aware of rx for vicodin for her neck pain and per SN will need to talk with ortho and keep ov with them on 5/2. Pt had verbal understanding of this and will call if symptoms do not improve.

## 2010-06-14 ENCOUNTER — Other Ambulatory Visit: Payer: Self-pay | Admitting: Pulmonary Disease

## 2010-06-14 ENCOUNTER — Ambulatory Visit: Payer: MEDICARE | Admitting: Adult Health

## 2010-06-19 NOTE — Letter (Signed)
January 25, 2009   Mohamed K. Arbutus Ped, MD  [address]  [place], [place] [address]   [name], [name]                DOB:  Sep 24, 1934   Dear Arbutus Ped,   I saw the patient back after her pulmonary function test.  Her FVC is  2.12 which is 79% of predicted with an FEV-1 of 1.5 which is 57% of  predicted, and her diffusion capacity is 68%.  This is very borderline  whether she would tolerate a left upper lobectomy.  After looking at her  studies, I think I can get by with an apical segmentectomy trying to  getting the neck cancer lesion and the other 2 nodules at the same time  and hopefully still preserve at least 50% of her left upper lobe.  We  will do a node dissection also.  We have tentatively scheduled her for  July 08, 2009.  Dr. Ernestina Penna saw her from a gynecological standpoint and  scheduled for some type of polypectomy with an overnight stay prior to  her surgery.  I appreciate the opportunity of seeing the patient.   Sincerely,   Ines Bloomer, M.D.  Electronically Signed   DPB/MEDQ  D:  01/25/2009  T:  01/25/2009  Job:  [id number]   cc:   Lendon Colonel, MD

## 2010-06-19 NOTE — Letter (Signed)
May 03, 2009   [name]. Arbutus Ped, MD  [address]  [place], [place] [address]   [name], [name]                DOB:  May 04, 1934   Dear Arbutus Ped,   I saw the patient back today.  She is complaining of some more shortness  of breath.  She also complains of neck pain and some other pain.  Her  lungs are clear to auscultation and percussion.  Her chest x-ray is  stable, and really from my standpoint she is doing well overall.  She  has another CT scan scheduled in late August.  Her blood pressure was  180/890, pulse 98, respirations 20, sats were 93%.  She agreed to stop  her amiodarone, so I told her to restarted her verapamil which she had  been on previously.  Again from my standpoint, she is doing well, but I  told her if she continues to have problems with breathing or other  medical problems to check with her medical doctor.  I will see her again  in 2 months with a chest x-ray.   Ines Bloomer, M.D.  Electronically Signed   DPB/MEDQ  D:  05/03/2009  T:  05/04/2009  Job:  [id number]

## 2010-06-19 NOTE — Assessment & Plan Note (Signed)
[facility]                            CARDIOLOGY OFFICE NOTE   HAVA, MASSINGALE                       MRN:          [id number]  DATE:11/02/2007                            DOB:          [birth year]    HISTORY OF PRESENT ILLNESS:  Ms. Mortell is a 76-year-patient referred by  Rubye Oaks, NP and Dr. Kriste Basque for pressure in her chest and increasing  shortness of breath.  She has previously been seen by Dr. Antoine Poche and  she had a normal Myoview in 2002.   Her symptoms primarily relate to her lung, she has a previous history of  lung cancer with right upper lobectomy.  Her FVC is 1.93, FEV1 is only  1.04, which is 49% of predicted.   She has been having increasing dyspnea.  She has recently been on a  steroid taper.  She initially had good results a few weeks ago with Depo-  Medrol shot, but is now short of breath again.  She has been overweight,  but has not had an increase in weight gain.  She has no documented  coronary artery disease.  The history inappropriately says she has had  peripheral vascular disease, which is not true.  She has had little bit  of lower extremity edema in the past.   The patient occasionally gets pressure in her chest, but it sounds like  it is more related to wheezing and her breathing problems.  She does not  get pressure outside of her dyspnea.  She has had no palpitations or  syncope.  No history of DVT.   Chest x-ray done in July showed no acute changes.   She had a BNP, which was normal.   I do not have a recent echocardiogram on the patient.   I talked to the patient at length.  I told her that it was important to  rule out coronary artery disease, given the extent of her lung disease.  She has had a previous knee replacement, but can walk.  I think she can  tolerate a stress Myoview.  I also told her that it would be important  to rule out progressive pulmonary hypertension.  We will initially try  to get  clues about this with a 2-D echocardiogram.  I told her the only  2 indications for heart cath either right or left would be to rule out  coronary artery disease or assess for pulmonary hypertension.   REVIEW OF SYSTEMS:  Otherwise negative.   PAST MEDICAL HISTORY:  Remarkable for previous lung cancer with right  upper lobectomy, history of asthma, COPD, history of hypertension, and  history of right knee surgery.  No documented coronary artery disease.   FAMILY HISTORY:  Remarkable for mother dying at age 7 of old age.  Father having premature coronary artery disease.  The patient is a  previous smoker, quitting in 1996.  She drinks 2 glasses a wine a day.  She is a widower.  She has 1 child.   MEDICATIONS:  1. Spiriva.  2. Ventolin.  3. An aspirin a day.  4. Verapamil 240 a day.  5. Diovan/HCTZ 160/12.5.  6. Fenoglide 120 a day, which is fenofibrate.  7. Actonel.  8. Caltrate.  9. Multivitamins.  10.Lasix 20 a day.  11.Prednisone taper.  12.Xanax.   PHYSICAL EXAMINATION:  GENERAL:  Remarkable for an overweight white  female, in no distress.  VITAL SIGNS:  Her weight is 186, blood pressure 140/80, pulse 90 and  regular, respiratory 14, afebrile.  HEENT:  Unremarkable.  NECK:  Carotids are normal without bruit, no lymphadenopathy, no  thyromegaly, no JVP elevation.  LUNGS:  Decreased breath sounds at the right apex with previous  lobectomy.  No active wheezing.  HEART:  S1 and S2 with distant heart sounds.  PMI normal.  ABDOMEN:  Benign.  Bowel sounds positive.  No AAA, no tenderness, no  bruit, no hepatosplenomegaly, no hepatojugular reflux, no tenderness.  EXTREMITIES:  Distal pulses are intact, trace edema.  NEURO:  Nonfocal.  SKIN:  Warm and dry.  MUSCULOSKELETAL:  No muscular weakness.   EKG is normal.   IMPRESSION:  1. Pressure in the chest related to dyspnea likely related to her lung      disease.  Check stress Myoview.  2. Hypertension, currently well  controlled.  Continue verapamil.  3. Chronic obstructive pulmonary disease with dyspnea.  Continue      steroid taper.  Assess 2-D echocardiogram to rule out progressive      pulmonary hypertension, assess right ventricular and left      ventricular function.  4. Cholesterol and triglyceride abnormalities.  Continue fibrate      therapy per Dr. Kriste Basque.  5. Overall, I am not sure that [place] has an active cardiac problem.      Given the extent and      severity of her pulmonary issues it is important to rule out      coronary artery disease and pulmonary hypertension.  We will start      with a Myoview and echocardiogram.     Theron Arista C. Eden Emms, MD, [facility]  Electronically Signed    PCN/MedQ  DD: 11/02/2007  DT: 11/03/2007  Job #: [id number]

## 2010-06-19 NOTE — Letter (Signed)
January 17, 2009   [name], MD  [phone]  [place], [place] [address]   Re:  LAKE, BREEDING                DOB:  30-Sep-1934   Dear Arbutus Ped,   I saw the patient back today.  I appreciate the opportunity of seeing  the patient in 2005.  We did a right upper lobectomy for a  bronchoalveolar cancer.  She has since been followed with a CT scan that  showed 3 lesions in the left upper lobe, the most recent showed that the  more prominent one was increased in size, and a PET scan was done that  showed increased uptake in that one with a standard uptake value of 3.0.  The other 2 lesions showed no uptake.  This is quite a complicated  situation since she had a right upper lobectomy.  She does say she gets  short of breath with exertion.  I think the first step is repeat a  pulmonary function test with DLCO to see how much lung can be removed  safely.  Obviously, I think she has got a low-grade bronchoalveolar  cancer.  There may be some way that I can wedge at least 3 lesion out  and still save at least 50% of her left upper lobe.  I have discussed  this in detail with her, and I will see her back again in 1 week after  we get her pulmonary function test.   Ines Bloomer, M.D.  Electronically Signed   DPB/MEDQ  D:  01/17/2009  T:  01/18/2009  Job:  [id number]

## 2010-06-19 NOTE — Assessment & Plan Note (Signed)
[facility]                            CARDIOLOGY OFFICE NOTE   NAME:[name]                       MRN:          [id number]  DATE:01/19/2008                            DOB:          08-10-1934    [name] returns today for followup.  She continues to have some atypical  pain.  The pain sounds like reflux.  She apparently was placed on  Prilosec by Dr. Kriste Basque and this has not helped.  She was asking for  something stronger.   The patient was initially referred I believe in October.  At that time,  she had an adenosine Myoview on November 12, 2007, which was nonischemic  with a good EF.  She also had a 2-D echocardiogram, which showed an EF  60-65% with no significant valvular heart disease.  She has a history of  lung cancer with right upper lobectomy and a lot of her dyspnea  secondary to her lung disease, as I recall her FEV-1 is quite bad under  45%.  The patient continues to have atypical pains in her chest.  They  are related to food.  She has not had her gallbladder looked at yet.   REVIEW OF SYSTEMS:  Otherwise negative.   MEDICATIONS:  She is on chronic prednisone, Advair, Prilosec, verapamil  240 a day, Actonel, Diovan hydrochlorothiazide 160/12.5, Caltrate,  Spiriva, and Fenoglide 120 a day.   PHYSICAL EXAMINATION:  GENERAL:  Remarkable for an overweight jovial  female in no distress.  VITAL SIGNS:  Her weight is 200, blood pressure 150/80, pulse 82 and  regular, respiratory rate 14, afebrile, status post right lobectomy.  LUNG:  Sounds are otherwise okay with good diaphragmatic motion.  HEART:  S1 and S2, normal heart sounds.  PMI normal.  ABDOMEN:  Benign.  Bowel sounds positive.  No AAA, no tenderness, no  bruit, no hepatosplenomegaly, no hepatojugular reflux, no tenderness.  EXTREMITIES:  Distal pulses are intact.  No edema.  NEURO:  Nonfocal.  SKIN:  Warm and dry.  MUSCULOSKELETAL:  No muscular weakness.   IMPRESSION:  1.  Ongoing symptoms more related to probable reflux or      gastroesophageal reflux disease.  I took the liberty of giving her      prescriptions for Protonix 40.  She will follow with Dr. Kriste Basque to      see if an EGD is necessary.  We will also do right upper quadrant      ultrasound to rule out gallbladder disease.  I think that the      likelihood in the setting of a normal Myoview that this being a      heart disease is quite low.  2. Hypertension, currently well controlled.  Continue current dose of      verapamil and Diovan.  3. Shortness of breath, more related to lung disease with previous      lung cancer and right upper lobectomy.  Followup with Dr. Kriste Basque.      Continue inhalers.     Noralyn Pick. Eden Emms, MD, [facility]  Electronically Signed  PCN/MedQ  DD: 01/19/2008  DT: 01/20/2008  Job #: [id number]

## 2010-06-19 NOTE — Letter (Signed)
March 22, 2009   [name]. Arbutus Ped, MD  [address]  [place], [place] [address]   [name], [name]                DOB:  1934-12-23   Dear Dr. Arbutus Ped:   The patient came today.  Her blood pressure was 142/80, pulse 100,  respirations 22, sats were 96%.  She has had bronchitis for the last 2-3  weeks, it was very severe last week.  She had been treated with Levaquin  and a Z-Pak and is now improved.  She still has a very bad cough.  Chest  x-ray today showed no pneumonia, just postoperative changes.  Hopefully,  she will gradually improve over the next several weeks.  I told her to  go back to see Dr. Kriste Basque.  If she does not, I will see her back again in  4 weeks with another chest x-ray.  I referred her back to you for  followup.   Sincerely,   Ines Bloomer, M.D.  Electronically Signed   DPB/MEDQ  D:  03/22/2009  T:  03/23/2009  Job:  [id number]   cc:   Lonzo Cloud. Kriste Basque, MD

## 2010-06-19 NOTE — Letter (Signed)
July 05, 2009   [name], MD  [phone]  [place], [place] [address]   [name], [name]                DOB:  03/17/1934   Dear Arbutus Ped:   I saw the patient back today.  Her chest x-ray is stable.  Her blood  pressure was 145/77, pulse 88, respirations 22, and sats were 95%.  She  is doing well from my standpoint.  I will plan to see her back again in  August when she gets her first CT scan.  Her main complaint was dyspnea  and she is going on a diet for this, but I told her that this was  probably going to be like this from now on considering the amount of  lung we had to remove.  She has had a recent epidural steroid injection  for some back pain.  I will see her back again as mentioned in August  after a CT scan.   Ines Bloomer, M.D.  Electronically Signed   DPB/MEDQ  D:  07/05/2009  T:  07/06/2009  Job:  [id number]   cc:   Lonzo Cloud. Kriste Basque, MD

## 2010-06-19 NOTE — Letter (Signed)
February 21, 2009   [name]. Arbutus Ped, MD  [address]  [place], [place] [address]   [name], [name]                DOB:  15-Jan-1935   Dear Arbutus Ped,   I saw the patient back in the office today.  Her blood pressure is  154/81, pulse 69, respirations 18, sats were 95%.  We discussed her  operative findings and that she had 3 lesions in the left upper lobe.  She was presenting to cancer conference with observation being the only  further treatment recommended.  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  We removed her chest tube sutures and I told her to  restart her Diovan.  We will continue to keep her off verapamil since  she is on the Lopressor.  I will see her back again in 3 weeks with  another chest x-ray.  She can start driving next week.  She is taking  minimal pain medication.   Ines Bloomer, M.D.  Electronically Signed   DPB/MEDQ  D:  02/21/2009  T:  02/22/2009  Job:  [id number]

## 2010-06-22 NOTE — Discharge Summary (Signed)
[name], [name]                ACCOUNT NO.:  [id number]   MEDICAL RECORD NO.:  [id number]          PATIENT TYPE:  INP   LOCATION:  2022                         FACILITY:  [facility]   PHYSICIAN:  Ines Bloomer, M.D. DATE OF BIRTH:  [birth year]   DATE OF ADMISSION:  01/03/2004  DATE OF DISCHARGE:                                 DISCHARGE SUMMARY   ADMIT DIAGNOSIS:  Multiple right and left lung lesions.   PAST MEDICAL HISTORY:  1.  Hypercholesterolemia.  2.  Hypertension.  3.  Intermittent asthmatic bronchitis.  4.  Bladder cancer in 2001.  5.  Thyroid goiter.  6.  Arthritis.  7.  Multiple lung lesions on the left and right.  8.  Status post right video-assisted thoracoscopic surgery, right      thoracotomy, right upper lobe lobectomy, right lower lobe wedge      resection, and lymph node dissection.  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  9.  Osteoporosis.  10. Nephrolithiasis.  11. GERD.  12. Severe claustrophobia.   PAST SURGICAL HISTORY:  1.  Status post right video-assisted thoracoscopic surgery with right upper      lobe lobectomy, right lower lobe wedge resection, and lymph node      dissection.  2.  Status post TURBT in October 2001.  3.  Status post right rotator cuff repair in 1991.  4.  Status post tongue tumor excision.  5.  Status post left knee arthroscopy in March 2002.   ALLERGIES:  1.  CODEINE.  2.  EPINEPHRINE.  3.  MORPHINE.  All of which cause extreme shakiness.   The patient is also severely claustrophobic.   BRIEF HISTORY:  The patient is a 76 year old female who recently lost 45  pounds through dieting.  She has a history of bladder cancer in 2001, and on  January 05, 2003, had a needle biopsy that revealed atypical pneumocyte  proliferation of a right upper lobe nodule.  She has had multiple CT scans  secondary to these nodules and the last scan revealed three  lesions in the  left upper lobe and three lesions in the right upper lobe with the largest  being 2.8-cm.  There was also a questionable small less than 1-cm lesion in  the right lower lobe.  The patient had pulmonary function testing performed  which revealed an FVC of 1.80 and an FEV1 of 1.24.  The patient was  subsequently referred by Dr. Kriste Basque to Dr. Edwyna Shell regarding possible surgical  intervention.  After review of the patient's information, Dr. Edwyna Shell felt  that the patient should undergo right video-assisted thoracoscopic surgery  with biopsies taken of these lesions.   HOSPITAL COURSE:  The patient was admitted and taken to the OR, on January 03, 2004, for a right VATS, right thoracotomy, right upper lobectomy, right  lower lobe wedge resection, and lymph nodes dissection.  Pathology obtained  by frozen section during the procedure revealed adenocarcinoma of the right  upper  lobe.  The patient tolerated the procedure well, was hemodynamically  stable immediately postoperative.  The patient was transferred from the OR  to the post anesthesia care unit in stable condition.  The patient was  extubated without complication and woke up from anesthesia neurologically  intact.  The patient's postoperative course progressed as expected.  Her chest tubes  were discontinued in a routine fashion and she tolerated this well without  complication.  The patient is ambulating well at this time.  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 Arbutus Ped of  the heme/onc service was consulted.  Dr. Arbutus Ped saw the patient on January 06, 2004.  It was his opinion that at this time there was no evidence of  benefit for adjuvant chemotherapy and/or radiation therapy for a stage IA  adenocarcinoma with bronchial alveolar features.  He suggested that the  other lesions be monitored with repeat CT scan and that the patient follow  up with him two months after  discharge.  On postoperative day two, the patient is without complaint.  The temperature  was 100.5 and her other vital signs were stable.  The chest tubes have been  discontinued at this time.  The incisions were stable and healing well.  Cardiac exam revealed a regular rate and rhythm and the lungs revealed  slightly decreased breath sounds in the bases.  The patient is in stable  condition at this time and as long as she continues to progress in the  current manner will be ready for discharge within the next 1-2 days.   LABS:  CBC and BMP, on January 06, 2004, white count 9.4, hemoglobin 12,  hematocrit 34.6, platelets 215.  Sodium 133, potassium 3.9, BUN 7,  creatinine 0.5, glucose 112.   CONDITION ON DISCHARGE:  Stable.   INSTRUCTIONS:  1.  Medications:      1.  Verapamil 240 mg every day.      2.  Lipitor 10 mg every day.      3.  Advair one puff b.i.d.      4.  Tylox 1-2 p.o. q.4-6h. p.r.n. pain.  2.  Activity:  No driving and the patient is to continue daily breathing      walking exercises.  3.  Diet:  Low salt, low fat.  4.  Wound care:  The patient is to clean the incisions daily with soap and      water.  If the incisions become red, swollen, or __________  , if the      patient develops a fever of 101 degrees Fahrenheit, she is to contact      the CVTS office at [phone].   FOLLOWUP APPOINTMENT:  1.  Dr. Edwyna Shell one week after discharge.  The CVTS office will contact the      patient with the date and time of this appointment.  2.  [facility] one hour before the appointment with Dr.      Edwyna Shell for a PA and lateral chest x-      ray.  3.  Dr. Arbutus Ped.  The patient will be contacted for an appointment two      months after discharge at which time a chest CT will be performed.       AY/MEDQ  D:  01/06/2004  T:  01/08/2004  Job:  [id number]   cc:   Ines Bloomer, M.D.  [address]  [place]  [place] [address]  [name], MD  Fax:  [phone]   patient's hospital chart

## 2010-06-22 NOTE — Assessment & Plan Note (Signed)
[facility]                             PULMONARY OFFICE NOTE   NAME:[name], [name]                       MRN:          [id number]  DATE:01/07/2006                            DOB:          04-Jun-1934    HISTORY OF PRESENT ILLNESS:  This is a very pleasant 76 year old female  patient of Dr. Sherene [name], who has a known history of COPD, hyperlipidemia and  hypertension, who presents related to elevated blood pressures.  The  patient reports over the last couple of months she has had several  elevated blood pressure readings and recently had a blood pressure of  160/90.  The patient complains she has had some intermittent headaches.  She denies any visual problems, extremity weakness, chest pain or  palpitations.  The patient is currently maintain on verapamil 240 mg  daily and reports that she has been tolerating medication well.   PAST MEDICAL HISTORY:  Reviewed.   CURRENT MEDICATIONS:  Reviewed.   PHYSICAL EXAMINATION:  GENERAL:  The patient is a pleasant female in no  acute distress.  VITAL SIGNS:  She is afebrile.  Blood pressure recheck is 160/90.  HEENT:  Unremarkable.  NECK:  Supple without adenopathy.  No JVD.  LUNGS:  Lung sounds are clear.  CARDIAC:  Regular rate and rhythm.  ABDOMEN:  Soft and benign.  EXTREMITIES:  Warm with trace edema.   IMPRESSION AND PLAN:  Hypertension, currently not optimally controlled.  The patient is to begin Diovan/HCT 160/12.5 mg daily.  I encouraged on a  low-sodium diet, weight loss, and we will recheck here in 4 weeks.  Lab  work is currently pending.      [name] Oaks, NP  Electronically Signed      [name]. [name] Sires, MD, [facility]  Electronically Signed   TP/MedQ  DD: 01/07/2006  DT: 01/08/2006  Job #: [id number]

## 2010-06-22 NOTE — Assessment & Plan Note (Signed)
[facility]                              CARDIOLOGY OFFICE NOTE   NAME:Grinage, BIJAL SIGLIN                       MRN:          [id number]  DATE:10/10/2005                            DOB:          22-May-1934    RETURN OFFICE VISIT FOR LIPID CLINIC:   PAST MEDICAL HISTORY:  1. Hyperlipidemia.  2. History of tobacco abuse.  3. Obesity.  4. Asthma.   MEDICATIONS:  1. Verapamil 240 mg daily.  2. Advair 250/50 mg twice daily.  3. Multivitamin daily.  4. Calcium daily.  5. Actonel once a week.  6. Aspirin 81 mg daily.  7. Glucosamine once daily.  8. Crestor 2.5 mg daily.   VITAL SIGNS:  Weight 185 pounds.  Blood pressure 120/70.  Heart rate 80.   LABORATORY DATA:  No new lab data at this time.   ASSESSMENT:  Ms. Kyer is a very pleasant woman who returns to [facility]  today with no chest pain, no shortness of breath, no muscle aches or pains.  She is very excited to state that she is tolerating her Crestor 2.5 mg daily  very well; she has been on it for 2 weeks.  She is not having any problems.  She is also very excited about her 3-pound weight loss since her last visit  here a month ago.  She is just back from vacation for 10 days.  She golfed 5  days, in which she rode a cart, but did do a lot of walking.  On 3 days of  her vacation, she also walked.  Since being back for the last 2 weeks, she  joined [facility], where she exercises 30 minutes on the  elliptical, 25 minutes on the bike while doing some arm weights and 20  minutes on the treadmill.  She also has started a low-carbohydrate, low-fat  diet.  She has limited her salt and is planning on decreasing her portion  size with her evening meal, which is her largest meal, and she and her  husband frequently have seconds.  This visit was just a followup visit to  see how she was tolerating her Crestor, which she is doing very well, and I  am very excited about her lifestyle  modification that she has undertaken  over the last 4 weeks.   PLAN:  1. Continue Crestor 2.5 mg daily.  2. Continue lifestyle modification.  3. Followup visit in 8 weeks for lipid panel and LFTs and make changes      that will be needed at that time.                                  Leota Sauers, PharmD                            Jesse Sans. Daleen Squibb, MD, [facility]   LC/MedQ  DD:  10/10/2005  DT:  10/11/2005  Job #:  [id number]

## 2010-06-22 NOTE — Consult Note (Signed)
[name]                ACCOUNT NO.:  [id number]   MEDICAL RECORD NO.:  [id number]          PATIENT TYPE:  INP   LOCATION:  2022                         FACILITY:  [facility]   PHYSICIAN:  [name], MD  DATE OF BIRTH:  [birth year]   DATE OF CONSULTATION:  01/06/2004  DATE OF DISCHARGE:  01/08/2004                                   CONSULTATION   REASON FOR CONSULTATION:  Lung cancer.   HISTORY OF PRESENT ILLNESS:  [name] is a 76 year old female asked to see  in consultation for evaluation of lung cancer.  She had history of TCC grade  1 or 2, well differentiated diagnosed in 2001.  On CT of the chest in  November 2004 that was taken after an x-ray found the patient to have a  right upper lobe mass, an ill-defined opacity in the right upper lobe  measuring 2.6 cm was seen in which a needle biopsy performed showed atypical  pneumocyte proliferation.  She had multiple CT scans to followup on these  nodules.  The last on December 26, 2003 showing the largest right upper lobe  mass measuring 2.8 cm as well as three lesions in the left upper lobe, very  vague.  No lymphadenopathy in the chest or axilla.  There is a questionable  less than 1 cm right upper lobe nodule as well.  Right VATS, right upper  lobectomy with node dissection in which resection of the right lower lobe  lesion on January 03, 2004 was performed by Dr. Edwyna Shell.  Path report  [id number] Dr. Delila Spence, revealed adenocarcinoma, bronchioalveolar type, grade  2, negative margins, negative pleural involvement, no vascular invasion, no  direct extension.  Of the eight lymph nodes, all were negative.  Maximum  tumor size was 1.7 cm.  Stage T1,N0,MX.   PAST MEDICAL HISTORY:  1.  T1,N0,MX adenocarcinoma of the lung as above.  2.  Hypertension.  3.  Osteoarthritis.  4.  History of transitional cell carcinoma of the bladder, well      differentiated, noninvasive.  5.  Asthmatic bronchitis.  6.  Osteoporosis.  7.   Hyperlipidemia.  8.  Nephrolithiasis.  9.  Gastroesophageal reflux disease.  10. Severe claustrophobia.   SURGERIES/PROCEDURE:  1.  Status post right VATS, right upper lobectomy with node dissection,      wedge resection of the right lower lobe lesion January 03, 2004 Dr.      Edwyna Shell.  2.  Status post TURBT, November 12, 1999.  3.  Status post right rotator cuff repair in 1991.  4.  Status post tumor excision.  5.  Status post left knee arthroscopy, April 16, 2000.   ALLERGIES:  1.  CODEINE.  2.  EPINEPHRINE.  3.  DEMEROL.   CURRENT MEDICATIONS:  1.  Albuterol nebulizer q.6h.  2.  Advair one puff b.i.d.  3.  Zinacef 1.5 g q.12h.  4.  Zocor 20 mg q.h.s.   REVIEW OF SYSTEMS:  Remarkable for fatigue, weight loss of 45 pounds  dieting, with no decreasing appetite.  No dyspnea on exertion.  The rest  of  the review of systems essentially negative.   FAMILY HISTORY:  Mother died at 17 with chronic obstructive pulmonary  disease and heart failure.  Father died at 19 with myocardial infarction and  ulcers.   SOCIAL HISTORY:  The patient is married.  She has two grown children.  No  alcohol history.  She quit in 1996 the use of cigarettes.  Main caretaker is  husband [name].   PHYSICAL EXAMINATION:  GENERAL:  This is a 76 year old white female in no  acute distress, alert and oriented, but slightly groggy from the  medications.  VITAL SIGNS:  Blood pressure 130/60, pulse 90, respirations 16, temperature  100.5 postop, weight 158, height 5 feet, pulse oximetry 92% on room air.  HEENT:  Normocephalic, atraumatic.  PERRLA.  Oral mucosa without thrush.  NECK:  Supple.  No jugular venous distention.  No cervical or  supraclavicular masses.  CHEST:  Symmetrical on inspiration with decreased breath sounds at the  bases.  There is a trace of rales on the right. Chest tube with no air leak.  BREASTS:  Not examined.  CARDIOVASCULAR:  Regular rate and rhythm with no murmurs, rubs or  gallops.  ABDOMEN:  Soft, nontender.  Bowel sounds x4.  No palpable spleen or liver.  GU/RECTAL:  Deferred.  EXTREMITIES:  No clubbing or cyanosis.  No edema.  SKIN:  Without petechiae or purpura.  NEURO:  Nonfocal.   LABORATORY DATA:  Hemoglobin 12, hematocrit 34.6, white count 9.4, platelets  215, MCV 96.3, PT 12.4, PTT 33, INR 0.9, sodium 130, potassium 4.3, BUN 5,  creatinine 0.8, total bilirubin 0.8, alkaline phosphatase 49, AST 17, ALT  18, total protein 5.7, albumin 2.7, calcium 8.6.   A chest x-ray on January 06, 2004 showed improved right lung atelectasis,  and mild chronic interstitial disease.   ASSESSMENT/PLAN:  Dr. Arbutus Ped has seen and evaluated the patient and the  chart has been reviewed.  This is a patient diagnosed with stage 1a,  T1,N0,MX nonsmall-cell lung carcinoma, adenocarcinoma with bronchoalveolar  pattern involving the right upper lobe, status post right upper lobectomy on  January 03, 2004.  There are a few other areas of infiltrates on both  lungs, inflammatory versus early bronchoalveolar cancer.   PLAN:  1.  There is no evidence of benefit for adjuvant chemotherapy or      radiotherapy for stage IA.  2.  Will monitor the other questionable lung lesions with repeat CT scan in      two months.  3.  If no recurrence 6 months after surgery, Dr. Arbutus Ped will consider the      patient for chemoprevention trial with      selenium vs. placebo.  We will schedule the patient for a followup      appointment in two months with CAT scans and labs.   Thank you very much for allowing [place] to participate in the care of Ms.  Iannone.      Huntley Dec   SW/MEDQ  D:  01/09/2004  T:  01/09/2004  Job:  [id number]

## 2010-06-22 NOTE — Assessment & Plan Note (Signed)
[facility]                                 ON-CALL NOTE   [name], [name]                       MRN:          [id number]  DATE:03/08/2006                            DOB:          1934-11-27    [name] called complaining of bronchitis and asking for a  prescription for Avelox.  She states this has had this before and it  works well for her and she specifically wanted this antibiotic.   Avelox 400 mg, #7, called into the [facility] at [phone].     Lonzo Cloud. Kriste Basque, MD  Electronically Signed    SMN/MedQ  DD: 03/08/2006  DT: 03/08/2006  Job #: [id number]

## 2010-06-22 NOTE — Assessment & Plan Note (Signed)
[facility]                               PULMONARY OFFICE NOTE   NAME:[name], [name]                       MRN:          [id number]  DATE:08/15/2005                            DOB:          01-25-1935    PULMONARY/ACUTE OFFICE EVALUATION   HISTORY:  A 76 year old white female, a former smoker with a history of  asthmatic bronchitis maintained on Advair 250/50, one daily, acutely ill for  3 days with increasing dyspnea associated with fevers and chills, hacking  dry cough and increasing dyspnea.  Her sensation is one that her chest feels  more tight than she is actually short of breath with activity.  She tried  albuterol with not much help.   ALLERGIES:  CODEINE AND EPHEDRINE.   MEDICATIONS:  Taken in detail on the worksheet, dated August 15, 2005.  Correct as listed.   SOCIAL HISTORY:  She is a former smoker having quit in 1996.   FAMILY HISTORY:  Negative for respiratory diseases or atopy.   PAST SURGICAL HISTORY:  Significant for the fact the patient is status post  right upper lobectomy in November 2005 for a stage 1A non-small cell lung  cancer and is followed by Dr. Edwyna Shell with serial chest x-rays, but we do not  have an x-ray on her here in recent years.   PHYSICAL EXAMINATION:  GENERAL:  This is a anxious but non-toxic appearing,  ambulatory white female in no acute distress.  VITAL SIGNS:  She had normal vital signs.  HEENT:  Remarkable for the fact that she is quite hoarse.  Oropharynx is  clear.  NECK:  Supple without cervical adenopathy.  Trachea is midline.  LUNGS:  Lung fields reveal few rhonchi bilaterally.  HEART:  Regular rate and rhythm without murmur, gallop or rub.  ABDOMEN:  Soft and benign.  EXTREMITIES:  Warm without calf tenderness, cyanosis, clubbing or edema.   Chest x-ray shows increased markings most impressive on the lateral view all  along the major fissure but no baseline x-rays available. No definite air  space disease is seen on the PA view.   IMPRESSION:  Dyspnea, chest tightness and hacking cough all consistent with  chronic obstructive pulmonary disease exacerbation but with chest x-ray that  may show air space disease but no convincing lobar pattern.  Empirically, I  am going to recommend she take 7 days of Levaquin in case this x-ray does  represent acute change and have her return for follow up to see Dr. Kriste Basque  within the next 2 weeks.  I have recommended also Advair be dosed twice  daily  but note parenthetically that this patient is quite hoarse and may either  have irritation from the Advair or low-grade reflux that will need to be  addressed on return visit.                                   Charlaine Dalton. Sherene Sires, MD, FCCP   MBW/MedQ  DD:  08/19/2005  DT:  08/19/2005  Job #:  [id number]

## 2010-06-22 NOTE — Discharge Summary (Signed)
[facility]  Patient:    [name], [name]                 MRN: [id number] Adm. Date:  [id number] Disc. Date: [id number] Attending:  Skip Mayer Dictator:   Della Goo, P.A.C.                           Discharge Summary  ADMISSION DIAGNOSES:  1. End-stage osteoarthritis, left knee.  2. Hypertension.  3. Severe claustrophobia.  4. Reflux disease.  5. History of renal calculi.  6. Hypercholesterolemia.  7. History of asthmatic bronchitis.  8. Hemorrhoids.  9. Osteoporosis. 10. History of bladder cancer.  DISCHARGE DIAGNOSES:  1. End-stage osteoarthritis, left knee.  2. Hypertension.  3. Severe claustrophobia.  4. Reflux disease.  5. History of renal calculi.  6. Hypercholesterolemia.  7. History of asthmatic bronchitis.  8. Hemorrhoids.  9. Osteoporosis. 10. History of bladder cancer. 11. Post-hemorrhagic anemia. 12. Hyponatremia.  PROCEDURE:  On 04/16/00, the patient underwent left total knee arthroplasty utilizing the [employer] system, performed by Dr. Darrelyn Hillock, assisted by Dr. Simonne Come, under spinal anesthesia.  CONSULTATIONS:  Dr. Johna Roles of physical medicine and rehabilitation.  HISTORY OF PRESENT ILLNESS:  Ms. Poarch is a 76 year old white female with chronic bilateral knee pain, left greater than right.  She has undergone conservative treatment, including corticosteroid injections without relief of her symptoms.  X-rays have revealed severe degenerative changes of the left knee.  It was felt that she would require surgical intervention, and was admitted for the procedure as stated above.  HOSPITAL COURSE:  The patient tolerated the procedure without difficulties. Postoperatively, she was placed on Coumadin for DVT and PE prophylaxis, and adjustments in her Coumadin dose were made according to daily prothrombin times at the pharmacy at [facility].  Postoperatively, neurovascular motor function was noted  to be intact after the spinal anesthetic had resolved.  The patient was able to utilize PCA analgesics for her discomfort, however, her IV infiltrated and there was difficulty in maintaining an IV site.  She was weaned to p.o. analgesics a little earlier than usual. Fortunately, she was able to tolerate the oral analgesics.  She did develop some nausea and constipation, and was treated with Reglan, as well as laxatives, and eventually the nausea resolved, and she was having adequate bowel movements.  The patient was noted to have hyponatremia.  Her Is and Os were watched closely.  She was noted to be on Maxzide.  Over the course of the hospital stay her hyponatremia resolved.  Hemovac drain was discontinued on the first postoperative day, and dressing changes were done daily thereafter. The patient showed no signs of infection of the wound during the hospital stay.  The patient was started on the usual physical therapy program for ambulation and gait training, as well as range of motion and strengthening exercises.  She was allowed weightbearing as tolerated on the operative extremity.  She eventually was able to ambulate as much as 120 feet with the physical therapy assistance, utilizing a walker.  CPM was used, and the patient ranged to as much as 50 degrees of passive motion.  She also worked with active range of motion during the hospital stay.  The patient had a low-grade fever prior to discharge which was resolved at the time of discharge.  She complained of no pulmonary or urinary symptoms.  On 04/21/00, the patient was felt to be stable for  discharge to her home.  She did receive a rehab consult, however, due to her excellent progress during the hospital stay she did not require inpatient rehabilitation.  LABORATORY DATA:  Admission CBC was within normal limits.  Hemoglobin dropped to the lowest value of 10.3 postoperatively, and she did not require a blood transfusion.  Coagulation  studies on admission were normal, and showed adequate elevations of prothrombin time and INR while on Coumadin.  She also was treated with heparin until her coagulation values began to elevate, and her Coumadin dose was stabilized.  Chemistry studies, as stated above, the patient developed hyponatremia which improved during the hospital stay with management of her fluids.  Admission chemistries showed values normal. Urinalysis on admission was negative for urinary tract infection.  A repeat urinalysis on 3/14 showed small hemoglobin, small leukocyte esterase, 0 to 5 WBCs, and 0 to 5 RBCs per high powered field.  There is no chest x-ray or EKG on the chart at the time of this dictation.  CONDITION ON DISCHARGE:  Stable.  PLAN:  The patient is discharged to her home where home health physical therapy will be provided by [place].  She will continue utilizing the CPM machine at home as well for passive range of motion.  She will also be taught active range of motion exercises, as well as strengthening exercises, and she will continue to ambulate weightbearing as tolerated utilizing her walker. Dressing change will be done daily at home by the patient and her family, and supplies have been given for her to do so.  She will be allowed to shower at home.  She will resume a regular diet.  The patient has been advised to monitor her temperatures at home, and if she develops fevers to notify Dr. Joellyn Rued office.  She will follow up with Dr. Darrelyn Hillock two weeks from the date of her surgery, and has been advised to call the office for an appointment.  DISCHARGE MEDICATIONS: 1. Darvocet-N 100 #30 one q.4-6h. p.r.n. pain. 2. Robaxin 500 mg #30 one q.8h. p.r.n. spasm. 3. Coumadin dose per pharmacy.  Her Coumadin management will be done by    [facility].  She will call the office if there are questions or concerns prior to her return office visit. DD:  05/07/00 TD:  05/07/00 Job:  [id number]

## 2010-06-22 NOTE — Op Note (Signed)
[facility]  Patient:    [name], [name]                 MRN: [id number] Proc. Date: 06/04/00 Adm. 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  She is six weeks postoperative.  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  She is six weeks postoperative.  PROCEDURE:  Closed manipulation of the flexion contracture of the left total knee.  SURGEON:  Georges Lynch. Gioffre, M.D.  DESCRIPTION OF PROCEDURE:  Under general anesthesia, gentle closed manipulation of her left total knee was carried out.  I flexed her knee back to about 115 degrees.  Her extension came to neutral.  She was stable in the mediolateral plane.  The patient returns to the recovery room and will be started on the CPM machine at 90 degrees.  She is scheduled for therapy the following day after discharge. DD:  06/04/00 TD:  06/05/00 Job: [id number]

## 2010-06-22 NOTE — Assessment & Plan Note (Signed)
[facility]                               LIPID CLINIC NOTE   [name], [name]                       MRN:          [id number]  DATE:01/02/2006                            DOB:          [birth year]    PAST MEDICAL HISTORY:  1. Hyperlipidemia.  2. Obesity.  3. Hypertension.  4. Chronic obstructive pulmonary disease.  5. Osteoporosis.  6. History of tobacco abuse.  7. History of non-small cell lung cancer, in remission.   MEDICATIONS:  1. Verapamil 240 mg daily.  2. Advair 250 mg once daily.  3. Multivitamin daily.  4. Calcium daily with vitamin D.  5. Actonel once a week.  6. Aspirin 81 mg daily.  7. Glucosamine daily.  8. Vytorin 10/20 mg daily.   VITAL SIGNS:  Weight is 188 pounds, blood pressure 184/92 on her left  arm, rechecked again 184/84 at the end of the office visit, heart rate  96.   LAB DATA:  Total cholesterol 180, triglycerides 97, HDL 53 and LDL 75.   ASSESSMENT:  [name] is a pleasant woman who returns to the Lipid  Clinic today with no chest pain, no shortness of breath, no muscle aches  or pains.  She is compliant with the current medication regimen.  She  had had myalgias with Crestor 2.5 mg after approximately 6 to 8 weeks of  taking it.  She had called in and spoke to Dr. Shelby Dubin, who  discontinued the Crestor, checked her total CK and LFTs, which were  within normal limits, and changed her to Vytorin 10/20 mg daily.  Ms.  Carico says that she is tolerating this well without any myalgias, and  her lipid panel is fabulous, at goal, significantly improved from last  visit.  She is compliant with her medications; however, she is very  noncompliant with her lifestyle modification.  When I had seen her in  September she was just back from vacation, she had restarted her [employer], decreased her portion sizes, decreased the amount of  snacking that she was doing throughout the day, and had begun an  exercise regimen of both going to the gym and walking on treadmills and  other machines, and also exercising at home.  She had also long 10  pounds.  However, since that time she has fallen off the bandwagon.  She  has gained 7 of her 10 pounds back.  She still eats fairly heart healthy  meals following the [employer], but her portion sizes are much  larger.  She has restarted her snacking, especially on sweets and nuts  and cheese.  She also has stopped exercising.  She says that there  really is no reason for this, except that she has been lazy and has  picked other activities to do instead of getting up and going to the gym  for her morning workout.  Given her lipid panel being at goal and such a  significant improvement from last visit, I am not going to make any  changes to her medication at this  time.  However, we spent a lengthy  period of the visit discussing the benefits of exercise and encouraging  her to restart her exercise regimen.  I have encouraged her to do 3 days  a week of 30 minutes of exercise, and we will increase as tolerated.  I  think the most important thing is to get her back onto a scheduled  routine, and then add in extra days and extra time.  We also discussed  decreasing her portion sizes, to use smaller plates whenever eating,  whenever she goes to holiday parties that she does not have to have a  huge heaping of every hor d'oeuvre, but to take small portions of things  that are fairly healthy and things that she would enjoy.  She is  agreeable to this, and we will see her in 3 months to mark her progress  and to continue in encouraging her on this lifestyle modification.  Of  note on this visit her elevated blood pressure of 184/92.  She states  that yesterday at the oncology office that it was only 140/80.  However,  when I saw her earlier at the spa I also noted an elevated blood  pressure of 180/80.  She is to follow up at her primary care  physician's  office, Dr. Kriste Basque, for a blood pressure on Tuesday, and hopefully he  will continue to titrate her medications for a goal blood pressure of  less than 140 over less than 80.   PLAN:  1. Continue current medications.  2. Begin lifestyle modification of decreasing portion sizes and      increasing exercise.  3. Follow up blood pressure check with Dr. Jodelle Green office.  4. Followup visit in 3 months for lipid panel and LFTs, and make      adjustments needed at that time.      Leota Sauers, PharmD  Electronically Signed      Jesse Sans. Daleen Squibb, MD, [facility]  Electronically Signed   LC/MedQ  DD: 01/02/2006  DT: 01/03/2006  Job #: [id number]

## 2010-06-22 NOTE — Assessment & Plan Note (Signed)
[facility]                                 ON-CALL NOTE   [name], [name]                       MRN:          [id number]  DATE:10/11/2006                            DOB:          1935/01/04    TELEPHONE NUMBER:  [phone]   I was contacted by [name] at approximately 6pm on the 5th of  September identifying herself as a patient of Dr. Kriste Basque. She indicated  to me that she was leaving town and did not think that she had enough  Verapamil until she returned and did not have any refills on the  prescription.. She has had no symptoms. I called in a one month  prescription to her drug store with no refills and asked her to contact  her pharmacist for further refill instruction during regular office  hours.     Charlaine Dalton. Sherene Sires, MD, [facility]  Electronically Signed    MBW/MedQ  DD: 10/11/2006  DT: 10/12/2006  Job #: [id number]   cc:   Lonzo Cloud. Kriste Basque, MD

## 2010-06-22 NOTE — H&P (Signed)
[facility]  Patient:    [name], [name]                         MRN: [id number] Adm. Date:  04/16/00 Attending:  Georges Lynch. Darrelyn Hillock, M.D. Dictator:   Alexzandrew L. Julien Girt, P.A.-C. CCLonzo Cloud. Kriste Basque, M.D. [facility]             [employer]. Vonita Moss, M.D.             Rollene Rotunda, M.D. [facility]                         History and Physical  DATE OF BIRTH:  May 25, 1934  CHIEF COMPLAINT:  Left knee pain.  HISTORY OF PRESENT ILLNESS:  The patient is a 76 year old female who has been evaluated by Dr. Worthy Rancher for knee pain.  She complains of pain in her left knee.  The patient does have bilateral knee pain; however, the left is more symptomatic than the right at this time.  She has been seen in the office and worked up and found to have degenerative changes in the left knee.  She has undergone injections, which have only provided symptomatic relief.  It is discussed with her that she will need bilateral total knee arthroplasties. She comes back in for evaluation.  The knee pain has been progressive.  It has started to interfere with her daily activities and it is felt she would like to proceed with surgical intervention at this time.  Risks and benefits have been discussed with the patient and she has elected to proceed with surgery.  ALLERGIES:  No known drug allergies; she does have intolerances to CODEINE, which causes illness, and also EPINEPHRINE causes extreme shaking.  CURRENT MEDICATIONS: 1. Prevacid 30 mg daily. 2. Verapamil 240 mg daily. 3. Advair 500/50 b.i.d. 4. Maxzide 25 mg daily. 5. Glucosamine chondroitin b.i.d. 6. She has been on Centrum Silver vitamins; however, she has been off of these    for the past several weeks. 7. Darvocet p.r.n. pain.  PAST MEDICAL HISTORY:  Bladder cancer, asthmatic bronchitis, osteoporosis, hemorrhoids, hypercholesterolemia, renal calculi, reflux disease, hypertension.  Also, she suffers from severe  claustrophobia.  PAST SURGICAL HISTORY:  Excision of a bladder tumor.  She has undergone right rotator cuff repair in 1991, excision of a tumor from her tongue.  Also, a left knee arthroscopy with excision of a Bakers cyst at that time.  Her medical physicians include Dr. Alroy Dust, Dr. Larey Dresser, and Dr. Antoine Poche, which is her cardiologist.  She is supposed to undergo a chemical stress test on April 10, 2000 preoperatively to complete her preoperative medical and cardiac clearance.  Stress test is pending at the time of this dictation.  SOCIAL HISTORY:  She is married and has two sons.  Denies use of tobacco products.  Only has social intake of alcohol.  FAMILY HISTORY:  Father deceased at age 25, had a history of heart attack and ulcers.  Mother deceased age 11 with history of asthma, COPD, heart failure. She had undergone open heart surgery eight months prior to her death.  REVIEW OF SYSTEMS:  General:  The patient has had some fevers, chills, and night sweats due to a recent bout of bronchitis; however, she has been treated and all these symptoms have improved and resolved.  Neurologic:  No seizures, syncope, or paralysis.  Respiratory:  She does have some shortness of breath on exertion.  She did have a productive cough during the bronchitic episode; however, this is improved.  No hemoptysis.  Cardiovascular:  No chest pain, angina, or orthopnea.  GI:  The patient has had some intermittent nausea and reflux.  Denies vomiting, diarrhea, or constipation.  No blood or mucus in the stool.  GU:  No dysuria, hematuria, or discharge.  Musculoskeletal:  Pertinent to that of the left knee, found in the history of present illness.  PHYSICAL EXAMINATION:  VITAL SIGNS:  Pulse 92, respirations 16, blood pressure 158/88.  GENERAL:  The patient is a 76 year old white female short in stature, well-nourished, well-developed, appears to be in no acute distress.  She is mild to moderately  anxious at time of exam.  She is alert, oriented, and cooperative and very pleasant at time of exam.  Appears to be a fairly good historian.  HEENT:  Normocephalic, atraumatic.  Pupils round and reactive.  Oropharynx clear.  EOMs are intact.  NECK:  Supple.  CHEST:  Clear to auscultation and percussion.  No rhonchi or rales.  HEART:  Regular rate and rhythm.  No murmurs are appreciated.  S1, S2 noted. No rubs, thrills, palpitations.  ABDOMEN:  Soft, round.  Bowel sounds are present.  Nontender.  RECTAL/BREASTS/GENITALIA:  Not done, not pertinent to present illness.  EXTREMITIES:  Significant for that to the left lower extremity.  She has crepitus noted on passive range of motion.  She lacks approximately 15 degrees of full flexion and motor function is intact.  Moving foot and toes well on exam.  Sensation is intact.  IMPRESSION:  1. Left knee osteoarthritis.  2. Hypertension.  3. Severe claustrophobia.  4. Reflux disease.  5. History of renal calculi.  6. Hypercholesterolemia.  7. History of asthmatic bronchitis.  8. Hemorrhoids.  9. Osteoporosis. 10. History of bladder cancer.  PLAN:  The patient will be admitted to [facility] to undergo a left total knee replacement arthroplasty.  The patient is currently undergoing workup, per Dr. Antoine Poche.  Chemical stress test is pending at the time of this dictation.  Once the patient is cleared, she will be admitted for her left total knee replacement arthroplasty, pending diagnostic studies.  DD:  04/10/00 TD:  04/11/00 Job: [id number]

## 2010-06-22 NOTE — Assessment & Plan Note (Signed)
[facility]                               LIPID CLINIC NOTE   NASHAYLA, TELLERIA                       MRN:          [id number]  DATE:03/27/2006                            DOB:          05/13/1934    [name] comes in today with no acute complaints.  She has been  compliant with her current cholesterol medications, which includes  Vytorin 10/20 one daily.  She denies any muscle aches or pains while on  this medication.   Other medications include Verapamil, Advair, multivitamin, calcium,  Actonel, aspirin, glucosamine, and Diovan/hydrochlorothiazide.   PHYSICAL EXAMINATION:  Weight 179 pounds.  Blood pressure is 120/84.  Heart rate is 70.  Laboratory data includes total cholesterol 165, triglycerides 113, HDL  46.9, LDL 96.  Liver function tests are within normal limits.   ASSESSMENT:  Ms. Dejoy cholesterol numbers are all at goal.  She is  tolerating her Vytorin 10/20 very well.  She has tried to make changes  in her diet since we saw her last, decreasing sweets, eliminating fried  foods.  She has been on and off a [employer], and just in general,  tries to cut out as many carbs as possible.  As far as exercise, she  goes to the gym up to 3 times a week, but admits to this being very  sporadic.  When she is there, she uses the elliptical machine, treadmill  or a recumbent bike.  She also does some weight training and crunches.   PLAN:  We are going to continue with the current therapy.  We encouraged  her to continue with a heart healthy diet, and we asked her to try to  make her exercise a more regular part of her routine 2 or 3 times a week  consistently.  She admits this may be a challenge.  We gave her samples  of Vytorin and Diovan/hydrochlorothiazide.  We decided to follow up with  her with more labs in 6 months, and make any adjustments then that are  necessary.   Patient was seen along with Vernard Gambles, PharmD  resident.      Charolotte Eke, PharmD  Electronically Signed      Rollene Rotunda, MD, [facility]  Electronically Signed   TP/MedQ  DD: 03/27/2006  DT: 03/27/2006  Job #: [id number]

## 2010-06-22 NOTE — Discharge Summary (Signed)
[name], [name] NO.:  [id number]   MEDICAL RECORD NO.:  [id number]          PATIENT TYPE:  INP   LOCATION:  2022                         FACILITY:  [facility]   PHYSICIAN:  Pecola Leisure, PA   DATE OF BIRTH:  [birth year]   DATE OF ADMISSION:  01/03/2004  DATE OF DISCHARGE:  01/08/2004                                 DISCHARGE SUMMARY   ADDENDUM:  The patient was originally thought to be ready for discharge on  January 07, 2004, however, she was experiencing some nausea and, therefore,  Dr. Edwyna Shell felt that we should keep her for another day to monitor her.  She  was given laxatives and repeat chest x-ray was taken.  January 08, 2004, her  chest x-ray was stable and her labs were within normal limits.  Nausea had  resolved and she was felt stable for discharge on January 08, 2004.      AY/MEDQ  D:  02/27/2004  T:  02/27/2004  Job:  [id number]

## 2010-06-22 NOTE — Op Note (Signed)
[name], [name]                ACCOUNT NO.:  [id number]   MEDICAL RECORD NO.:  [id number]          PATIENT TYPE:  INP   LOCATION:  2899                         FACILITY:  [facility]   PHYSICIAN:  Ines Bloomer, M.D. DATE OF BIRTH:  1934-10-27   DATE OF PROCEDURE:  01/03/2004  DATE OF DISCHARGE:                                 OPERATIVE REPORT   PREOPERATIVE DIAGNOSES:  1.  Right upper lobe mass.  2.  Questionable ground glass lesions, right upper lobe and right lower      lobe.   POSTOPERATIVE DIAGNOSES:  1.  Right upper lobe mass.  2.  Questionable ground glass lesions, right upper lobe and right lower      lobe.   OPERATION:  Right video-assisted thorascopic surgery, right thoracotomy,  right upper lobectomy with node dissection, a wedge resection of right lower  lobe lesion.   SURGEON:  Ines Bloomer, M.D.   ASSISTANT:  Coral Ceo, P.A.-C.   INDICATIONS FOR PROCEDURE:  This patient had an enlarging right upper lobe  lesion, also on CT scan showed three small lesions in the right upper lobe  with ground glass appearance, one in the left upper lobe and one in the  superior segment of the right lower lobe.   DESCRIPTION OF PROCEDURE:  The patient was brought to the operating room and  underwent general anesthesia.  A percutaneous insertion of all monitoring  lines.  Was turned to the right lateral thoracotomy position.  A dual lumen  tube was inserted.  The patient was turned to the right lateral thoracotomy  position.  She was prepped and draped in the usual sterile manner.  Two  trocar sites were made in the anterior and the posterior axillary line at  the seventh intercostal space and at the mid-axillary line.  Two trocars  were inserted.  The 30-degree scope was inserted.  None of the lesions could  be seen with the scope.  No inter-pleural metastases were seen.  A posterior  lateral thoracotomy was made over the fifth intercostal space with the  latissimus  being divided.  The serratus was reflected anteriorly.  The fifth  intercostal space was entered.  Two Tuffier were placed at right angles.  The large lesion was palpated in the posterior segment of the right upper  lobe.  There were some areas of scarring in the apex where the other lesions  were seen on the CT scan.  Then there was an area of irregularity in the  superior segment.  It appeared to be scar.  This was wedged out with the EZ-  45 stapler.  The fissure was partially divided with the EZ-45 stapler, both  the major and the minor fissure.  Dissection was started anteriorly in the  anterior mediastinum, and two 4R nodes were dissected out then a 10R node.  The apical posterior branch of the right upper lobe was looped with a  vascular tape, stapled, and divided with the auto-suture #30 white reticular  stapler.  The superior pulmonary vein branched to the right upper lobe, and  was  dissected out, looped with a vascular tape, stapled, and divided with  the auto-suture #30 white reticular stapler.  The posterior mediastinum was then dissected out and the attention was then  turned to the fissure.  The pulmonary artery was identified and several 11R  nodes were dissected free, and the rest of the fissure was divided with the  auto-suture stapler.  The posterior branch of the pulmonary artery was  ligated proximally with #2-0 silk, clipped, and divided.  The minor fissure  was then divided with the Ethicon EZ-45 stapler.  The stapler was the TL-30  Ethicon stapler was placed in the bronchus.  It was fired.  The right upper  lobe was removed.  All 11R and 10R nodes were removed.  The pleural flap was  placed over the stump and sutured in place with #2-0 silk.  Marcaine block  was done in the usual fashion.  Two chest tubes were brought through the  trocar site and tied in place with #0 silk.  The On-Q catheters were placed  in the usual fashion and sutured in place with #4-0 chromic.   The chest was  closed with #4-0 chromic pericostals, #1 Vicryl in the muscle area and #2-0  Vicryl in the subcutaneous tissue.  The inferior pulmonary ligament had been  taken down prior to closing, and #3-0 Vicryl in the subcutaneous tissue, and  #3-0 Vicryl as a subcuticular stitch, and Dermabond for the skin.  The patient was returned to the recovery room in serious condition.       DPB/MEDQ  D:  01/03/2004  T:  01/03/2004  Job:  [id number]   cc:   Lonzo Cloud. Kriste Basque, M.D. [facility]

## 2010-06-22 NOTE — Op Note (Signed)
[facility]  Patient:    GYSELLE, MATTHEW                       MRN: [id number] Proc. Date: 11/12/99 Adm. Date:  [id number] Attending:  Lauree Chandler CC:         Lonzo Cloud. Kriste Basque, M.D. [facility]   Operative Report  PREOPERATIVE DIAGNOSIS:  Bladder carcinoma.  POSTOPERATIVE DIAGNOSIS:  Bladder carcinoma.  PROCEDURE:  Cystoscopy, bilateral retrograde pyelograms with interpretation, and TUR of bladder tumor.  SURGEON:  Maretta Bees. Vonita Moss, M.D.  ANESTHESIA:  General.  INDICATIONS:  This is a 76 year old white female who had gross hematuria and in the course of working her up, a renal ultrasound was unremarkable, but cystoscopy revealed a papillary tumor in the left bladder base with evidence of recent hemorrhage.  She was getting over bronchitis and is brought to the OR today for therapy.  DESCRIPTION OF PROCEDURE:  The patient was brought to the operating room and placed in the lithotomy position.  The external genitalia were prepped and draped in the usual fashion.  She was cystoscoped and the bladder was unremarkable except for a 1 cm to 1.5 cm papillary tumor in the left bladder base beyond the left ureteral orifice.  It looked low grade and superficial. The rest of the bladder was unremarkable.  A 5-French whistle-tip ureteral catheter was utilized to perform bilateral retrograde pyelogram that showed no filling defects of obstruction in the upper tracts.  Permanent films were taken and _____ to my office.  I then inserted the resectoscope sheath and using the hot loupe, I resected this papillary tumor.  Sent tissue for pathology.  Part of the base of the tumor was coagulated, so I took an extra bite for a deeper biopsy.  The biopsy site was completely fulgurated around the edges including the mucosal edges.  There was no evidence of any residual tumor.  I believe it was completely resected.  There was no bleeding and good hemostasis.  The bladder  was emptied.  The resectoscope removed and the patient sent to the recovery room in good condition. DD:  11/12/99 TD:  11/13/99 Job: [id number]

## 2010-06-22 NOTE — Assessment & Plan Note (Signed)
[facility]                              CARDIOLOGY OFFICE NOTE   NAME:[name], [name] SCHOOLER                       MRN:          [id number]  DATE:09/12/2005                            DOB:          [birth year]    PAST MEDICAL HISTORY:  1. Hypertension.  2. Hyperlipidemia.  3. Chronic obstructive pulmonary disease.  4. Osteoporosis.  5. History of tobacco abuse.  6. History of nonsmall cell lung cancer, currently in remission.   VITAL SIGNS:  Weight 188 pounds.  Blood pressure 170/90, on recheck 150/80.  Heart rate 90.   MEDICATIONS:  1. Verapamil 240 mg once a day.  2. Advair 250/50 mg twice daily.  3. Multi-vitamin daily.  4. Calcium/Vitamin D daily.  5. Actonel once a week.  6. Aspirin 81 mg daily.  7. Glucosamine once daily.   MEDICATION INTOLERANCES:  Lipitor 10 mg, Pravachol 20 mg, Zocor 20 mg all  cause myalgias after approximately 6 weeks on the medication.   LABORATORY DATA:  Total cholesterol 229, triglycerides 228, LDL 147, HDL 38.  Liver function tests within normal limits.  Fasting CBG 135, BUN 21,  creatinine 0.7.   ASSESSMENT:  [name] is a pleasant woman who comes to the [facility]  today with no chest pain, no shortness of breath, no muscle aches or pains.  She is compliant with current medication regimen and she was referred to the  [facility] by her primary physician, Dr. Kriste Basque, after several medication  intolerances to statins all causing myalgias.  She has a family history of a  father who died of a myocardial infarction at 54, a mother who had positive  coronary artery disease and had a coronary artery bypass graft, died shortly  after her coronary artery bypass graft with a myocardial infarction at 67.   Her total cholesterol is above goal of less than 200.  Triglycerides are  greater than goal of less than 150.  LDL is greater than goal of less than  130 but we will aim for a goal of as close to 100 as possible  and HDL is  less than goal of greater than 40.  Fasting CBG is elevated at 135.  Also  with increased triglycerides, I will follow up again with fasting CBG and  hemoglobin A1c on further work-up.  Of note, the CBG was taken shortly after  a steroid burst secondary to an asthma exacerbation.   [name] had previously been on exercise regimen, about 2 years ago, where  she was going to the gym and walking or doing the elliptical machine.  She  also had lost 48 pounds on the [employer], also approximately 2 years  ago.  She followed the [employer] for about 6 months very strictly and  then after that says that she watches the amount of carbohydrates that she  eats and tries to limit those.  However, she has gained those 48 pounds plus  back in the last year.  She says that she eats Cheerios with Splenda in the  morning, she tries  to eat low carbohydrates, eats a salad with very little  light dressing on it, no fried foods.  She bakes and boils her meat, chicken  and fish.  Does occasionally eat fried oysters but those are very rare.  Her  biggest vice is that she does not snack between meals, then evening time  while watching TV, eats a lot of honey roasted peanuts.  She seems like a  very fun-loving woman who loves to travel and says that it is very  challenging for her to follow a heart healthy diet when she is doing all of  the traveling that she was doing.  She says her biggest problem was when she  went on a cruise in June she ate everything in sight and then some, and then  since then has not been very conscientious about her diet.  About a week  ago, whenever she was told that her fasting CBG was elevated, she decided  that she was definitely going to do something about it.  She has started  exercising by walking about 20 minutes two to three times in the last week.  She also golfs one time a week.  She is headed to the beach and so I have  encouraged her to walk 20  minutes daily on the beach with her husband or  other family member to begin an exercise routine.  I have encouraged her to  be conscientious of the foods that she is eating while at the beach but this  is not the time to start any strict diet.  We will readdress this issue when  she is back from her family vacation and in a more regular routine.  Also  given that she has had myalgias with other statins in the past, I have given  her Crestor 2.5 mg daily which she is to start after she returns from her  vacation.  I have given her 2 weeks of samples and I will see her in 1  month, which would approximately be the 2 week point after starting her  Crestor, to evaluate how she is tolerating the medication and reencourage  lifestyle modification.   PLAN:  1. Begin Crestor 2.5 mg daily after she is back from her 2 week vacation      with her family.  2. Begin an exercise regimen by walking 20 minutes every day while she is      on vacation.  3. Readdress dietary intake at next visit to help reinstate a healthy      lifestyle.                                  Leota Sauers, PharmD                            Jesse Sans. Daleen Squibb, MD, [facility]   LC/MedQ  DD:  09/12/2005  DT:  09/12/2005  Job #:  [id number]

## 2010-06-22 NOTE — Op Note (Signed)
. [facility]  Patient:    [name], [name]                 MRN: [id number] Proc. Date: 04/16/00 Adm. Date:  [id number] Attending:  Skip Mayer                           Operative Report  PREOPERATIVE DIAGNOSIS:  Severe degenerative arthritis with genu varus of the left knee.  POSTOPERATIVE DIAGNOSIS:  Severe degenerative arthritis with genu varus of the left knee.  OPERATION PERFORMED:  Left total knee arthroplasty utilizing the Osteonic system.  We utilized the posterior cruciate sacrificing type prosthesis and all three components were cemented.  The sizes used:  Size 26 patella, size 5 tibial tray with a 12 mm thickness tibial insert.  The femoral component was a size 5 left.  SURGEON:  Georges Lynch. Darrelyn Hillock, M.D.  ASSISTANT:  Illene Labrador. [place], M.D.  ANESTHESIA:  Spinal.  DESCRIPTION OF PROCEDURE:  Under spinal anesthesia, routine orthopedic prepping and draping of the left lower extremity was carried out.  The leg was exsanguinated with an Esmarch and tourniquet was elevated at 350 mmHg.  An incision was made down the anterior aspect of the left knee.  Bleeders were identified and cauterized. Two flaps were created.  At this time a median parapatellar approach was carried out.  The patella was reflected laterally. The knee was flexed. I excised the anterior and posterior cruciate ligaments and also did a medial and lateral meniscectomy.  At this time a #1 jig was inserted after a drill hole was made in the intercondylar notch of the femur. I removed 10 mm thickness off the distal femur.  A #2 jig was inserted and then I carried out my anterior posterior chamfering cuts for a size 5 left femoral component.  After this, I then made my appropriate hole in the intramedullary portion of the tibial plateau and at this point a drill hole was made down in the plateau and the intramedullary rod was inserted and the jig for the  plateau cut was inserted and I removed 4 mm thickness off of the medial tibial plateau.  Once this was done, we then made our patellar groove cut and our intercondylar notch cut.  The size 511 femur trial was inserted with a size 5 tibial tray and a 12 mm thickness insert.  The knee was taken through a range of motion.  It was stable mainly with the size 12 insert.  We then cut our patellar notch.  We removed 10 mm thickness off the articular surface of the patella.  Three drill holes were made in the patella.  We then flexed the knee and made our keel cut in the tibial plateau.  Following this we then removed all trial components, thoroughly waterpicked out the knee, dried the knee out, cemented all three components in simultaneously.  Minimal lateral release was necessary.  We had good stability with the prosthesis.  We made sure that we checked for loose pieces of cement.  Once this was done, we then flexed the knee, removed the trial tibial insert and inserted our permanent tibial insert which is a size 12 mm thickness.  Once the knee was reduce, we inserted our Hemovac drain, thoroughly irrigated out the knee and closed the knee in layers in the usual fashion.  Sterile Neosporin ____________ dressing was applied.  She had 1 gm of IV Ancef  preoperatively. DD:  04/16/00 TD:  04/16/00 Job: [id number]

## 2010-06-27 ENCOUNTER — Encounter: Payer: Self-pay | Admitting: Adult Health

## 2010-07-05 ENCOUNTER — Ambulatory Visit (INDEPENDENT_AMBULATORY_CARE_PROVIDER_SITE_OTHER): Payer: Medicare Other | Admitting: Adult Health

## 2010-07-05 ENCOUNTER — Encounter: Payer: Self-pay | Admitting: Adult Health

## 2010-07-05 DIAGNOSIS — S81819A Laceration without foreign body, unspecified lower leg, initial encounter: Secondary | ICD-10-CM

## 2010-07-05 DIAGNOSIS — J45909 Unspecified asthma, uncomplicated: Secondary | ICD-10-CM

## 2010-07-05 DIAGNOSIS — S81009A Unspecified open wound, unspecified knee, initial encounter: Secondary | ICD-10-CM

## 2010-07-05 MED ORDER — PREDNISONE 10 MG PO TABS: ORAL_TABLET | ORAL | Status: AC

## 2010-07-05 MED ORDER — METHYLPREDNISOLONE ACETATE 80 MG/ML IJ SUSP
120.0000 mg | Freq: Once | INTRAMUSCULAR | Status: AC
  Administered 2010-07-05: 120 mg via INTRAMUSCULAR

## 2010-07-05 NOTE — Assessment & Plan Note (Addendum)
Mild flare  Steroid talk given Depo Medrol 120 IM given.   Plan:  Mucinex DM Twice daily  As needed  Cough/congestion  Fluids and rest    Prednisone taper over next week if breathing worsens with persistent wheezing.  Please contact office for sooner follow up if symptoms do not improve or worsen or seek emergency care   TDAP today

## 2010-07-05 NOTE — Progress Notes (Signed)
Subjective:    Patient ID: [name], female    DOB: Aug 06, 1934, 76 y.o.   MRN: [id number]  HPI 76 y/o WF with known hx of HTN, COPD, DJD, and DM  ~ September 25, 2009: 13mo ROV- c/o cough "at least 2 times per day" & tongue sore... she has had follow ups w/ her specialists: DrRamos 5/11 for LBP to right leg & given epid steroid shot (improved)... DrBurney 6/11 CXR stable & CT planned in Aug, she had some dypnea which he related to the amt of lung tissue resected... DrGerkin 6/11 for bilat thyroid nodules w/ benign bx- f/u sonar w/ multinod goiter, no ch in nodules, TSH= WNL.[name]. she has f/u appt w/ DrMohammed in several weeks...   ~ December 25, 2009: Add-on appt for "sinus"- c/o right eye problem "it's allergy" & notes red, swollen, right sided facial pain & HA; notes drainage "it pours" mucus, congestion; hurts in her teeth down to her jaw, but denies fever, discolored phlegm or blood... 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 we discussed checking sinus XRays & treating her w/ Depo/ Pred, Augmentin, Mucinex >> if symptoms persist she will need Neuro f/u for ?atypic facial pain?   ~ April 12, 2010:  She notes mult somatic concerns> SOB- "it's my weight", Cluster HAs- improved off wine "hopefully they are gone for the next 77yr cycle", right great toenail infection surg by podiatry & finally improved but reactions to Kelflex & Septra w/ nausea & diarrhea...  She had CT 3/12 per DrMohamed> COPD & post surg changes, <1cm ground glass nodule LUL w/o change, stable/ NAD, hep steatosis; they plan continued watchful waiting...  BP controlled on meds> 140/76 today & similar at home; needs better diet & wt reduction to keep from having to incr her BP meds...  Chol looks fair on Fenoglide + FishOil as she is intol to all statins> TChol 215, TG 181, HDL 40, LDL 157...  DM control is adeq on  the Metformin alone> Bs=140, A1c=6.8, but wt is up 11# to 197# & we reviewed low carb, low chol/fat diets...  Thyroid> TSH=1.08 (not on meds) & she reports being released by DrGerkins, no change x68yrs...  LABS 04/09/10 also shows Vit D still low at 24 ?on 2000u daily supplement> she prefers 50K weekly & we wrote Rx... we discussed Shingles vaccine for her at her convenience...   ~Jul 05, 2010 Acute OV Pt presents for an acute office visit. She would like to be checked after fall 1 week ago. Pt tripped on escalator at [employer] on 06/28/10 and fell down the whole flight of the moving escalator with subsequent brusing and abrasions. Pt says she was checked out by EMS w/ no obvious fx. She had no trouble with weight bearing. NO LOC. HAd brusing along right shoulder, arm and face , Shins with several abrasions. She has been cleaning with soap/water and applying abx ointment. No drainage or fever. Minimal redness.   She has noticed she is developing a dry cough, wheezing  And more trouble breathing last 2 days. She is requesting a depo medrol shot and steroid taper. She is leaving for a 10 day  Trip to [place] next week. She is very concerned she will get worse. I have explained to her that steroids have a lot of side effects. She is very adamanant today that is the  only thing that helps her.  No discolored mucus or fever.      Review of Systems Constitutional:   No  weight loss, night sweats,  Fevers, chills, fatigue, or  lassitude.  HEENT:   No headaches,  Difficulty swallowing,  Tooth/dental problems, or  Sore throat,                No sneezing, itching, ear ache, nasal congestion, post nasal drip,   CV:  No chest pain,  Orthopnea, PND, swelling in lower extremities, anasarca, dizziness, palpitations, syncope.   GI  No heartburn, indigestion, abdominal pain, nausea, vomiting, diarrhea, change in bowel habits, loss of appetite, bloody stools.   Resp:    No excess mucus, no productive cough,  No  non-productive cough,  No coughing up of blood.  No change in color of mucus.  + wheezing.  No chest wall deformity  Skin: no rash or lesions.  GU: no dysuria, change in color of urine, no urgency or frequency.  No flank pain, no hematuria   MS:  .  No decreased range of motion.     Psych:  No change in mood or affect. No depression or anxiety.  No memory loss.         Objective:   Physical Exam GEN: A/Ox3; pleasant , NAD, obese   HEENT:  [place]/AT,  EACs-clear, TMs-wnl, NOSE-clear, THROAT-clear, no lesions, no postnasal drip or exudate noted.   NECK:  Supple w/ fair ROM; no JVD; normal carotid impulses w/o bruits; no thyromegaly or nodules palpated; no lymphadenopathy.  RESP  Coarse BS w/ faint exp wheeze. no accessory muscle use, no dullness to percussion  CARD:  RRR, no m/r/g  , tr peripheral edema, pulses intact, no cyanosis or clubbing.  GI:   Soft & nt; nml bowel sounds; no organomegaly or masses detected.  Musco: Warm bil, no deformities or joint swelling noted.   Neuro: alert, no focal deficits noted.    Skin: Warm, bilateral anterior shins with several linears abrasions , scabbed over, no drainage or significant redness. No sign swelling noted. Ecchymosis along face and right shoulder. ROM nml , tender to touch.          Assessment & Plan:

## 2010-07-05 NOTE — Patient Instructions (Addendum)
Mucinex DM Twice daily  As needed  Cough/congestion  Fluids and rest  Wash areas with mild soap and water , pat gently , dry . Apply antibiotic ointment daily and cover with telfa wrap.  Prednisone taper over next week if breathing worsens with persistent wheezing.  Please contact office for sooner follow up if symptoms do not improve or worsen or seek emergency care   TDAP today

## 2010-07-05 NOTE — Assessment & Plan Note (Signed)
Fall on 06/28/10 down escalator at [facility] store with subsequent brusing to face/ shoulder and torso along with abrasions to  Lower anterior shins. - no sign of active infection   Plan:   Wash areas with mild soap and water , pat gently , dry . Apply antibiotic ointment daily and cover with telfa wrap.   Please contact office for sooner follow up if symptoms do not improve or worsen or seek emergency care   TDAP today

## 2010-07-25 ENCOUNTER — Telehealth: Payer: Self-pay | Admitting: Pulmonary Disease

## 2010-07-25 DIAGNOSIS — N644 Mastodynia: Secondary | ICD-10-CM

## 2010-07-25 MED ORDER — MOXIFLOXACIN HCL 400 MG PO TABS: 400.0000 mg | ORAL_TABLET | Freq: Every day | ORAL | Status: AC

## 2010-07-25 NOTE — Telephone Encounter (Signed)
Spoke with pt and she has 2 complaints.  1) She states she has felt a lump on her left breast the size of a dime and it is sore to touch. She states she has not seen an OB/GYN in several years so she wanted to know if SN would send an order for this to [place]. Pt has certain times she is available. She states tomorrow after 12 noon, Friday after 1pm, or anytime next week but she prefers this week.   2) Pt also c/o productive cough with green phlegm, increased SOB, hoarseness, sinus pressure, head congestion all x 2 days. She states she has been taking mucinex and OTC Sudafed without relief. Please advise. Carron Curie, CMA  Allergies  Allergen Reactions  . Cephalexin     REACTION: nausea and diarrhea  . Codeine     REACTION: nausea  . Epinephrine     REACTION: nervous  . Erythromycin     REACTION: all mycins cause yeast infections  . Morphine     REACTION: nausea  . Sulfamethoxazole W/Trimethoprim     REACTION: nausea and diarrhea

## 2010-07-25 NOTE — Telephone Encounter (Signed)
Per SN---call in avelox 400mg   1 daily #7   And ok for mammogram will be set up for [facility] asap.  But this will be done tomorrow and we will call her with this appt.  Pt is aware of this.

## 2010-08-01 ENCOUNTER — Telehealth: Payer: Self-pay | Admitting: Pulmonary Disease

## 2010-08-01 NOTE — Telephone Encounter (Signed)
ATC pt, NA and no option to leave msg, [facility]

## 2010-08-02 NOTE — Telephone Encounter (Signed)
Spoke with pt. She is c/o prod cough with thick, green sputum even after just finishing round of avelox that we called in on 6/20. She states that her breathing is getting worse and has noticed wheezing. I advised needs appt. OV sched with TP for 3:45 tomorrow with TP. Advised that she needs to seek emergency care asap. Pt verbalized understanding.

## 2010-08-03 ENCOUNTER — Ambulatory Visit (INDEPENDENT_AMBULATORY_CARE_PROVIDER_SITE_OTHER): Payer: Medicare Other | Admitting: Adult Health

## 2010-08-03 ENCOUNTER — Ambulatory Visit (INDEPENDENT_AMBULATORY_CARE_PROVIDER_SITE_OTHER)
Admission: RE | Admit: 2010-08-03 | Discharge: 2010-08-03 | Disposition: A | Discharge status: Home / Self Care | Payer: Medicare Other | Source: Ambulatory Visit | Attending: Adult Health | Admitting: Adult Health

## 2010-08-03 ENCOUNTER — Encounter: Payer: Self-pay | Admitting: Adult Health

## 2010-08-03 DIAGNOSIS — J45909 Unspecified asthma, uncomplicated: Secondary | ICD-10-CM

## 2010-08-03 DIAGNOSIS — J449 Chronic obstructive pulmonary disease, unspecified: Secondary | ICD-10-CM

## 2010-08-03 MED ORDER — METHYLPREDNISOLONE ACETATE 80 MG/ML IJ SUSP
120.0000 mg | Freq: Once | INTRAMUSCULAR | Status: AC
  Administered 2010-08-03: 120 mg via INTRAMUSCULAR

## 2010-08-03 MED ORDER — PREDNISONE 10 MG PO TABS: ORAL_TABLET | ORAL | Status: AC

## 2010-08-03 MED ORDER — ALBUTEROL SULFATE (2.5 MG/3ML) 0.083% IN NEBU
2.5000 mg | INHALATION_SOLUTION | Freq: Once | RESPIRATORY_TRACT | Status: AC
  Administered 2010-08-03: 2.5 mg via RESPIRATORY_TRACT

## 2010-08-03 NOTE — Patient Instructions (Signed)
Mucinex DM Twice daily  As needed  Cough/congestion  Fluids and rest  Prednisone taper over next week.  Please contact office for sooner follow up if symptoms do not improve or worsen or seek emergency care  follow up Dr. Kriste Basque  As planned and As needed

## 2010-08-06 NOTE — Progress Notes (Signed)
Subjective:    Patient ID: [name], female    DOB: 10/01/1934, 76 y.o.   MRN: [id number]  HPI 76 y/o WF with known hx of HTN, COPD, DJD, and DM  ~ September 25, 2009: 75mo ROV- c/o cough "at least 2 times per day" & tongue sore... she has had follow ups w/ her specialists: DrRamos 5/11 for LBP to right leg & given epid steroid shot (improved)... DrBurney 6/11 CXR stable & CT planned in Aug, she had some dypnea which he related to the amt of lung tissue resected... DrGerkin 6/11 for bilat thyroid nodules w/ benign bx- f/u sonar w/ multinod goiter, no ch in nodules, TSH= WNL.[name]. she has f/u appt w/ DrMohammed in several weeks...   ~ December 25, 2009: Add-on appt for "sinus"- c/o right eye problem "it's allergy" & notes red, swollen, right sided facial pain & HA; notes drainage "it pours" mucus, congestion; hurts in her teeth down to her jaw, but denies fever, discolored phlegm or blood... 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 we discussed checking sinus XRays & treating her w/ Depo/ Pred, Augmentin, Mucinex >> if symptoms persist she will need Neuro f/u for ?atypic facial pain?   ~ April 12, 2010:  She notes mult somatic concerns> SOB- "it's my weight", Cluster HAs- improved off wine "hopefully they are gone for the next 35yr cycle", right great toenail infection surg by podiatry & finally improved but reactions to Kelflex & Septra w/ nausea & diarrhea...  She had CT 3/12 per DrMohamed> COPD & post surg changes, <1cm ground glass nodule LUL w/o change, stable/ NAD, hep steatosis; they plan continued watchful waiting...  BP controlled on meds> 140/76 today & similar at home; needs better diet & wt reduction to keep from having to incr her BP meds...  Chol looks fair on Fenoglide + FishOil as she is intol to all statins> TChol 215, TG 181, HDL 40, LDL 157...  DM control is adeq on  the Metformin alone> Bs=140, A1c=6.8, but wt is up 11# to 197# & we reviewed low carb, low chol/fat diets...  Thyroid> TSH=1.08 (not on meds) & she reports being released by DrGerkins, no change x74yrs...  LABS 04/09/10 also shows Vit D still low at 24 ?on 2000u daily supplement> she prefers 50K weekly & we wrote Rx... we discussed Shingles vaccine for her at her convenience...   ~Jul 05, 2010 Acute OV Pt presents for an acute office visit. She would like to be checked after fall 1 week ago. Pt tripped on escalator at [employer] on 06/28/10 and fell down the whole flight of the moving escalator with subsequent brusing and abrasions. Pt says she was checked out by EMS w/ no obvious fx. She had no trouble with weight bearing. NO LOC. HAd brusing along right shoulder, arm and face , Shins with several abrasions. She has been cleaning with soap/water and applying abx ointment. No drainage or fever. Minimal redness.   She has noticed she is developing a dry cough, wheezing  And more trouble breathing last 2 days. She is requesting a depo medrol shot and steroid taper. She is leaving for a 10 day  Trip to [place] next week. She is very concerned she will get worse. I have explained to her that steroids have a lot of side effects. She is very adamanant today that is the  only thing that helps her.  No discolored mucus or fever. >>tx w/ depo medrol shot and prednisone   08/03/10 Acute OV  Pt complains of  wheezing, increased SOB, prod cough with green mucus x3days. She is leaving for the beach tomorrow and "does not want to be sick so she can play with her grandchildren". Cough with thick green mucus initially. Called in Avelox 1 week ago x 7 days- now finished. Mucus is now clear but she still has cough  and wheezing. Husband has similar symptoms. No chest pain or hemoptysis. No leg swelling. Today cxr with no acute changes.   Pt was seen 1 month ago for similar symptoms she says did great after last ov with  improved activity tolerance. "I would not been able to make all the travel in [place] without the steroid shot".      Review of Systems  Constitutional:   No  weight loss, night sweats,  Fevers, chills, fatigue, or  lassitude.  HEENT:   No headaches,  Difficulty swallowing,  Tooth/dental problems, or  Sore throat,                No sneezing, itching, ear ache, nasal congestion, post nasal drip,   CV:  No chest pain,  Orthopnea, PND, swelling in lower extremities, anasarca, dizziness, palpitations, syncope.   GI  No heartburn, indigestion, abdominal pain, nausea, vomiting, diarrhea, change in bowel habits, loss of appetite, bloody stools.   Resp:    No coughing up of blood.   + wheezing.  No chest wall deformity  Skin: no rash or lesions.  GU: no dysuria, change in color of urine, no urgency or frequency.  No flank pain, no hematuria   MS:  .  No decreased range of motion.     Psych:  No change in mood or affect. No depression or anxiety          Objective:   Physical Exam  GEN: A/Ox3; pleasant , NAD, obese   HEENT:  [place]/AT,  EACs-clear, TMs-wnl, NOSE-clear, THROAT-clear, no lesions, no postnasal drip or exudate noted.   NECK:  Supple w/ fair ROM; no JVD; normal carotid impulses w/o bruits; no thyromegaly or nodules palpated; no lymphadenopathy.  RESP  Coarse BS w/  exp wheeze. no accessory muscle use, no dullness to percussion  CARD:  RRR, no m/r/g  , tr peripheral edema, pulses intact, no cyanosis or clubbing.  GI:   Soft & nt; nml bowel sounds; no organomegaly or masses detected.  Musco: Warm bil, no deformities or joint swelling noted.   Neuro: alert, no focal deficits noted.    Skin: Warm, intact        Assessment & Plan:

## 2010-08-06 NOTE — Assessment & Plan Note (Addendum)
Slow to resolve recurrent exacerbation  Advised on steroids side effects/freq use once again.  May consider daliresp and/or med changes on return   Plan:  Depo Medrol 120mg  IM  Mucinex DM Twice daily  As needed  Cough/congestion  Fluids and rest  Prednisone taper over next week.  Please contact office for sooner follow up if symptoms do not improve or worsen or seek emergency care  follow up Dr. Kriste Basque  As planned and As needed

## 2010-09-06 ENCOUNTER — Other Ambulatory Visit: Payer: Self-pay | Admitting: Pulmonary Disease

## 2010-09-06 ENCOUNTER — Telehealth: Payer: Self-pay | Admitting: Pulmonary Disease

## 2010-09-06 DIAGNOSIS — E119 Type 2 diabetes mellitus without complications: Secondary | ICD-10-CM

## 2010-09-06 DIAGNOSIS — I10 Essential (primary) hypertension: Secondary | ICD-10-CM

## 2010-09-06 NOTE — Telephone Encounter (Signed)
Leigh pls have sn mark labs for this patient

## 2010-09-10 NOTE — Telephone Encounter (Signed)
Called [facility] informing patient that labs have been placed in the system for her; may come the week of her appt, the time the lab opens and if she has any questions/concerns to call the office.  Will sign off on note.

## 2010-09-10 NOTE — Telephone Encounter (Signed)
Pt had full labs in march.  Ok to order bmp and a1c for the pt.  Order has already been placed.

## 2010-09-18 ENCOUNTER — Telehealth: Payer: Self-pay | Admitting: Pulmonary Disease

## 2010-09-18 ENCOUNTER — Encounter: Payer: Self-pay | Admitting: Adult Health

## 2010-09-18 ENCOUNTER — Ambulatory Visit (INDEPENDENT_AMBULATORY_CARE_PROVIDER_SITE_OTHER): Payer: Medicare Other | Admitting: Adult Health

## 2010-09-18 VITALS — BP 126/74 | HR 79 | Temp 98.1°F | Ht 62.0 in | Wt 190.8 lb

## 2010-09-18 DIAGNOSIS — J449 Chronic obstructive pulmonary disease, unspecified: Secondary | ICD-10-CM

## 2010-09-18 MED ORDER — ONDANSETRON HCL 4 MG PO TABS: 4.0000 mg | ORAL_TABLET | Freq: Three times a day (TID) | ORAL | Status: DC | PRN

## 2010-09-18 MED ORDER — ALBUTEROL SULFATE (2.5 MG/3ML) 0.083% IN NEBU
2.5000 mg | INHALATION_SOLUTION | Freq: Once | RESPIRATORY_TRACT | Status: AC
  Administered 2010-09-18: 2.5 mg via RESPIRATORY_TRACT

## 2010-09-18 MED ORDER — PREDNISONE 10 MG PO TABS: ORAL_TABLET | ORAL | Status: AC

## 2010-09-18 MED ORDER — METHYLPREDNISOLONE ACETATE 80 MG/ML IJ SUSP
120.0000 mg | Freq: Once | INTRAMUSCULAR | Status: AC
  Administered 2010-09-18: 120 mg via INTRAMUSCULAR

## 2010-09-18 NOTE — Progress Notes (Signed)
Subjective:    Patient ID: [name], female    DOB: [birth year], 76 y.o.   MRN: [id number]  HPI 76 y/o WF with known hx of HTN, COPD, DJD, and DM  ~ September 25, 2009: 82mo ROV- c/o cough "at least 2 times per day" & tongue sore... she has had follow ups w/ her specialists: DrRamos 5/11 for LBP to right leg & given epid steroid shot (improved)... DrBurney 6/11 CXR stable & CT planned in Aug, she had some dypnea which he related to the amt of lung tissue resected... DrGerkin 6/11 for bilat thyroid nodules w/ benign bx- f/u sonar w/ multinod goiter, no ch in nodules, TSH= WNL.[name]. she has f/u appt w/ DrMohammed in several weeks...   ~ December 25, 2009: Add-on appt for "sinus"- c/o right eye problem "it's allergy" & notes red, swollen, right sided facial pain & HA; notes drainage "it pours" mucus, congestion; hurts in her teeth down to her jaw, but denies fever, discolored phlegm or blood... 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 we discussed checking sinus XRays & treating her w/ Depo/ Pred, Augmentin, Mucinex >> if symptoms persist she will need Neuro f/u for ?atypic facial pain?   ~ April 12, 2010:  She notes mult somatic concerns> SOB- "it's my weight", Cluster HAs- improved off wine "hopefully they are gone for the next 57yr cycle", right great toenail infection surg by podiatry & finally improved but reactions to Kelflex & Septra w/ nausea & diarrhea...  She had CT 3/12 per DrMohamed> COPD & post surg changes, <1cm ground glass nodule LUL w/o change, stable/ NAD, hep steatosis; they plan continued watchful waiting...  BP controlled on meds> 140/76 today & similar at home; needs better diet & wt reduction to keep from having to incr her BP meds...  Chol looks fair on Fenoglide + FishOil as she is intol to all statins> TChol 215, TG 181, HDL 40, LDL 157...  DM control is adeq on  the Metformin alone> Bs=140, A1c=6.8, but wt is up 11# to 197# & we reviewed low carb, low chol/fat diets...  Thyroid> TSH=1.08 (not on meds) & she reports being released by DrGerkins, no change x37yrs...  LABS 04/09/10 also shows Vit D still low at 24 ?on 2000u daily supplement> she prefers 50K weekly & we wrote Rx... we discussed Shingles vaccine for her at her convenience...   ~Jul 05, 2010 Acute OV Pt presents for an acute office visit. She would like to be checked after fall 1 week ago. Pt tripped on escalator at [employer] on 06/28/10 and fell down the whole flight of the moving escalator with subsequent brusing and abrasions. Pt says she was checked out by EMS w/ no obvious fx. She had no trouble with weight bearing. NO LOC. HAd brusing along right shoulder, arm and face , Shins with several abrasions. She has been cleaning with soap/water and applying abx ointment. No drainage or fever. Minimal redness.   She has noticed she is developing a dry cough, wheezing  And more trouble breathing last 2 days. She is requesting a depo medrol shot and steroid taper. She is leaving for a 10 day  Trip to [place] next week. She is very concerned she will get worse. I have explained to her that steroids have a lot of side effects. She is very adamanant today that is the  only thing that helps her.  No discolored mucus or fever. >>tx w/ depo medrol shot and prednisone   08/03/10 Acute OV  Pt complains of  wheezing, increased SOB, prod cough with green mucus x3days. She is leaving for the beach tomorrow and "does not want to be sick so she can play with her grandchildren". Cough with thick green mucus initially. Called in Avelox 1 week ago x 7 days- now finished. Mucus is now clear but she still has cough  and wheezing. Husband has similar symptoms. No chest pain or hemoptysis. No leg swelling. Today cxr with no acute changes.  Pt was seen 1 month ago for similar symptoms she says did great after last ov with  improved activity tolerance. "I would not been able to make all the travel in [place] without the steroid shot". >>steroid taper /Depo medrol shot.    09/18/2010 Acute OV  PT returns for recurrent asthma flare . Complains of dry cough, wheezing and increased dyspnea/DOE over last week. Has been having intermittent headache and nausea at times. Wants something for nausea. She says she felt so good last visit after her steroid shot. She requests another steroid shot . We discussed complications of steroids. Went over triggers of asthma/COPD flares.  She is leaving to go out of town next week and does not want to miss her trip. No calf pain or swelling. No chest pain or hemoptysis.  CXR last visit with no acute changes.      Review of Systems  Constitutional:   No  weight loss, night sweats,  Fevers, chills,  +fatigue, or  lassitude.  HEENT:     Difficulty swallowing,  Tooth/dental problems, or  Sore throat,                No sneezing, itching, ear ache, nasal congestion, post nasal drip,   CV:  No chest pain,  Orthopnea, PND, swelling in lower extremities, anasarca, dizziness, palpitations, syncope.   GI  No heartburn, indigestion, abdominal pain, vomiting, diarrhea, change in bowel habits, loss of appetite, bloody stools.   Resp:    No coughing up of blood.   + wheezing.  No chest wall deformity  Skin: no rash or lesions.  GU: no dysuria, change in color of urine, no urgency or frequency.  No flank pain, no hematuria   MS:  .  No decreased range of motion.     Psych:  No change in mood or affect. No depression or anxiety   Neuro: neg for visual/speech changes, arm weakness          Objective:   Physical Exam  GEN: A/Ox3; pleasant , NAD, obese   HEENT:  [place]/AT,  EACs-clear, TMs-wnl, NOSE-clear, THROAT-clear, no lesions, no postnasal drip or exudate noted.   NECK:  Supple w/ fair ROM; no JVD; normal carotid impulses w/o bruits; no thyromegaly or nodules palpated; no  lymphadenopathy.  RESP  Coarse BS w/  Faint exp wheeze. no accessory muscle use, no dullness to percussion  CARD:  RRR, no m/r/g  , tr peripheral edema, pulses intact, no cyanosis or clubbing.  GI:   Soft & nt; nml bowel sounds; no organomegaly or masses detected.  Musco: Warm bil, no deformities or joint swelling noted.   Neuro: alert, no focal deficits noted.    Skin: Warm, intact        Assessment & Plan:

## 2010-09-18 NOTE — Telephone Encounter (Signed)
Pt c/o sob, headache, diarrhea off and on and nausea x 1 week. She has tried taking Tylenol, Pepto Bismol, Prilosec, etc... She will see TP today at 2:45 pm.

## 2010-09-18 NOTE — Assessment & Plan Note (Signed)
Recurrent COPD flare  Will try to treat for triggers of GERD  Change inhalers- switch off DPI to Dulera  Plan:  Mucinex DM Twice daily  As needed  Cough/congestion  Fluids and rest  Prednisone taper over next week.  GERD diet Prilosec 20mg  daily  Zofran 4mg  every 8 hrs As needed  Nausea  Stop Advair Begin Dulera 200 2 puffs Twice daily  -brush/rinse/gargle after use.  Hold Actonel , fish oil, oil bases pills  Please contact office for sooner follow up if symptoms do not improve or worsen or seek emergency care  follow up Dr. Kriste Basque  As planned in 1 month and As needed

## 2010-09-18 NOTE — Patient Instructions (Addendum)
Mucinex DM Twice daily  As needed  Cough/congestion  Fluids and rest  Prednisone taper over next week.  GERD diet Prilosec 20mg  daily  Zofran 4mg  every 8 hrs As needed  Nausea  Stop Advair Begin Dulera 200 2 puffs Twice daily  -brush/rinse/gargle after use.  Hold Actonel , fish oil, oil bases pills  Please contact office for sooner follow up if symptoms do not improve or worsen or seek emergency care  follow up Dr. Kriste Basque  As planned in 1 month and As needed

## 2010-09-22 IMAGING — CR DG CHEST 2V
2 series · 2 of 2 positions shown · non-contrast
Comparison: [HOSPITAL] chest x-ray 02/14/2009 and [facility] chest CT 12/21/2008.

CLINICAL DATA: Mini thoracotomy, left upper lobectomy 02/06/2009.

CHEST - 2 VIEW

[w chest pa]
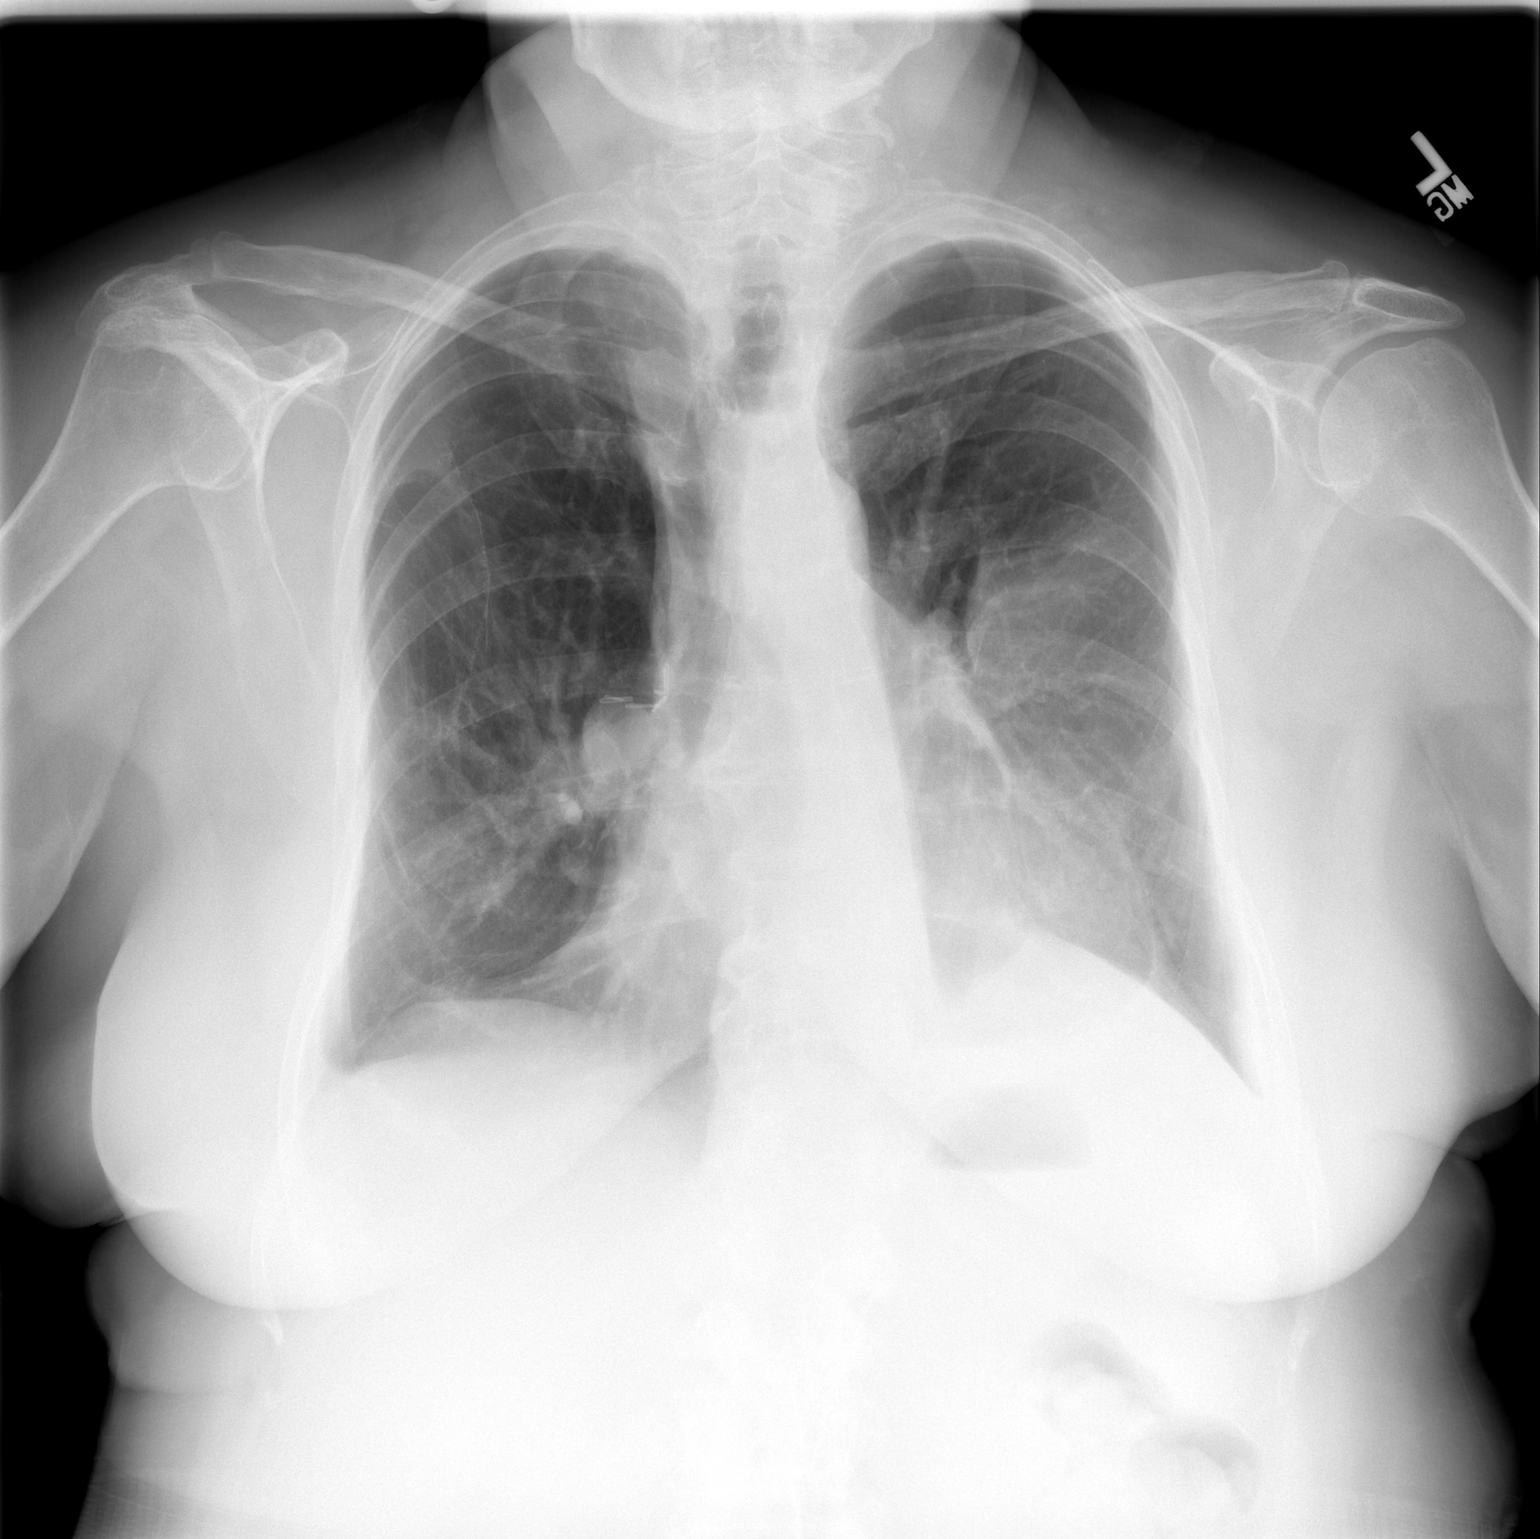

[w chest lat]
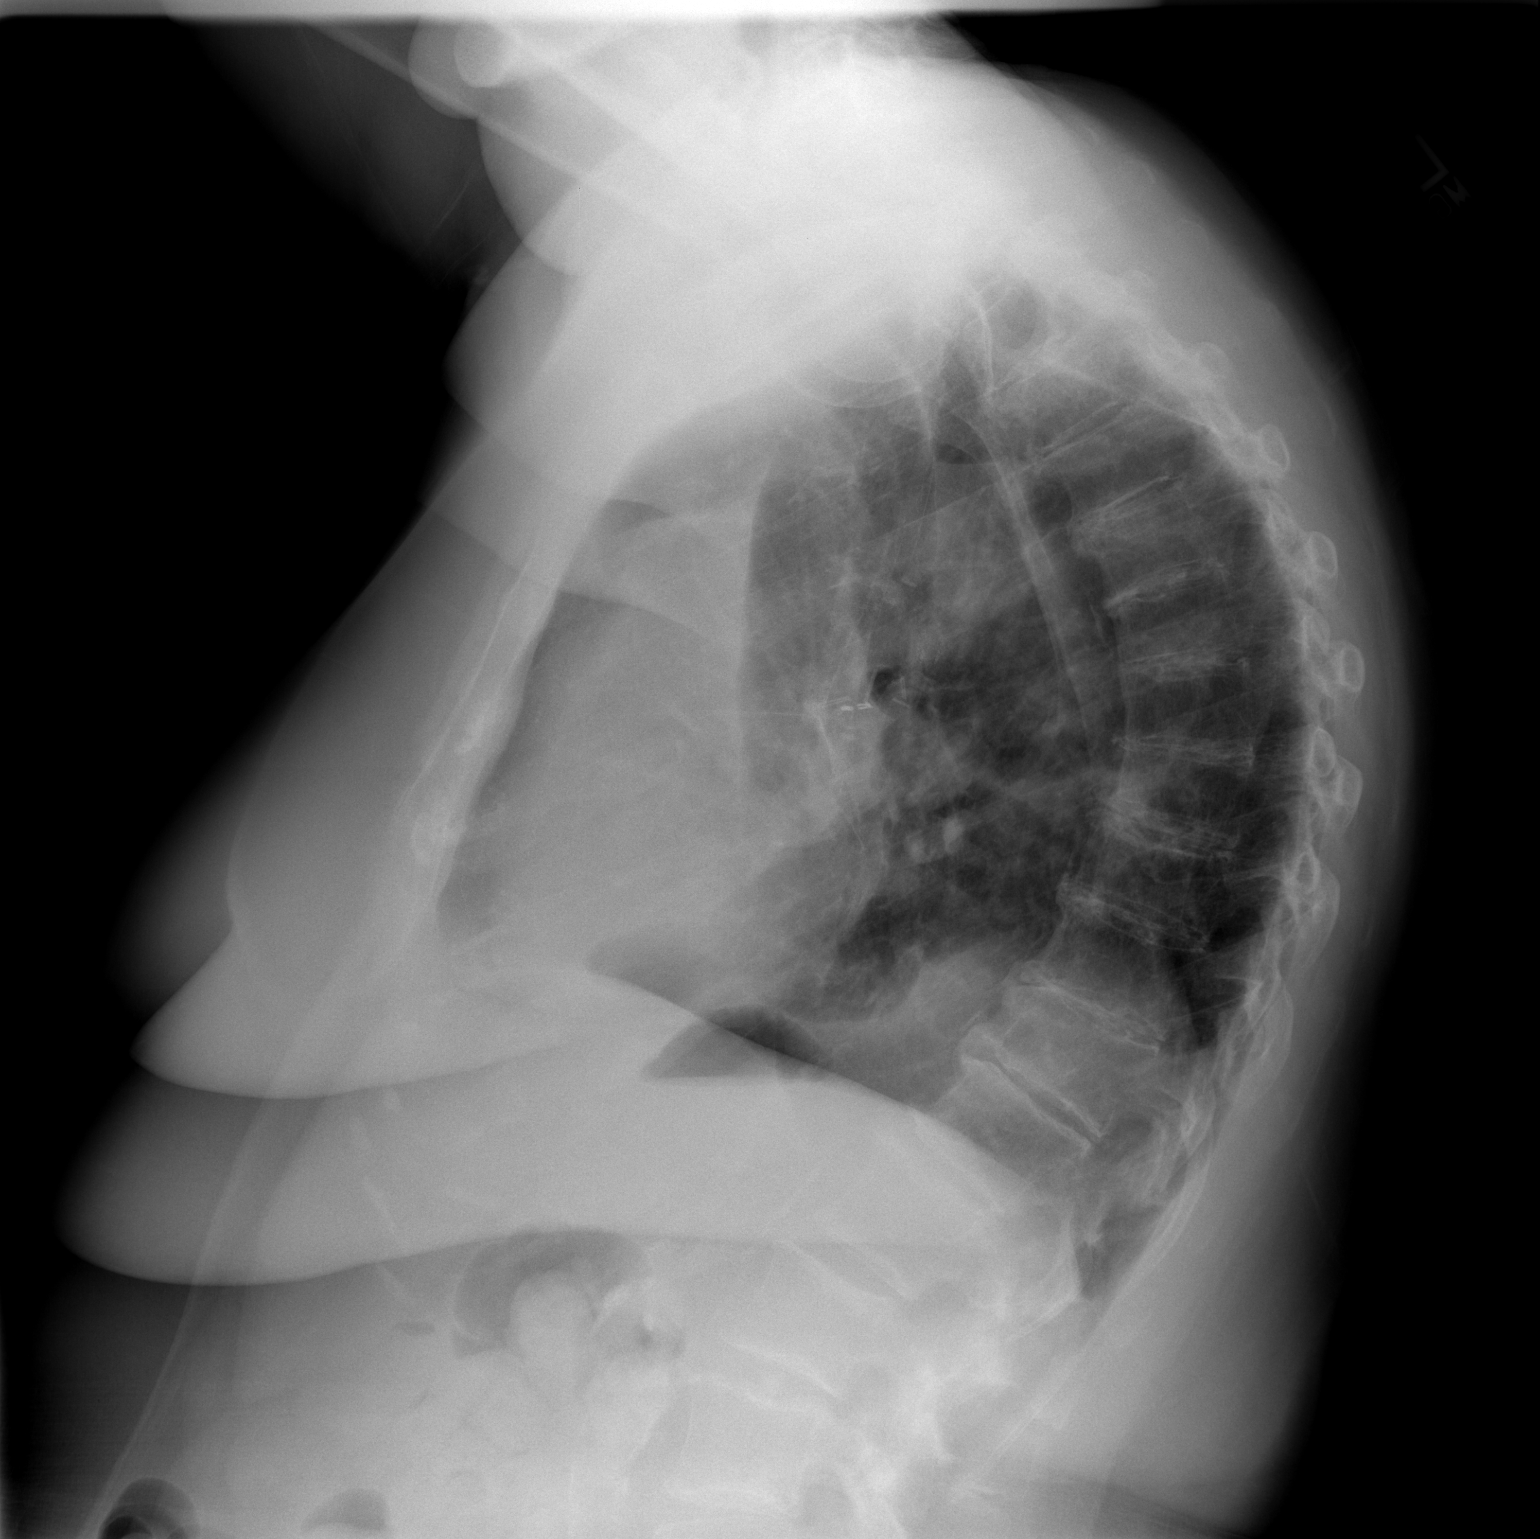

[2 of 2 positions shown; findings below may reference images not displayed]

FINDINGS: Greater aeration left lower lobe seen since 02/14/2009.
Stable subpleural right upper and right lower lobe opacities with
resolution previous small anterior left upper lobe
hydropneumothorax.  Stable left anterior upper lobe likely
postoperative pleural opacity and right hilar surgical clips
visualized.  Lungs are otherwise clear.  Stable heart size,
mediastinum, hila, pleura and osseous structures seen.
IMPRESSION: 1.  Greater aeration left lower lobe.
2.  Resolution previous small anterior left upper lobe
hydropneumothorax.
3.  Stable left upper lobe postoperative changes with right hilar
surgical clips and chronic right pleural parenchymal scarring.
4.  No interval acute findings.

## 2010-09-24 ENCOUNTER — Encounter: Payer: Self-pay | Admitting: Internal Medicine

## 2010-09-28 ENCOUNTER — Inpatient Hospital Stay (HOSPITAL_COMMUNITY)
Admission: EM | Admit: 2010-09-28 | Discharge: 2010-10-02 | DRG: 190 | Disposition: A | Discharge status: Home / Self Care | Payer: Medicare Other | Source: Ambulatory Visit | Attending: Pulmonary Disease | Admitting: Pulmonary Disease

## 2010-09-28 ENCOUNTER — Ambulatory Visit (INDEPENDENT_AMBULATORY_CARE_PROVIDER_SITE_OTHER): Payer: Medicare Other | Admitting: Adult Health

## 2010-09-28 ENCOUNTER — Inpatient Hospital Stay (HOSPITAL_COMMUNITY): Payer: Medicare Other

## 2010-09-28 DIAGNOSIS — J441 Chronic obstructive pulmonary disease with (acute) exacerbation: Secondary | ICD-10-CM

## 2010-09-28 DIAGNOSIS — K219 Gastro-esophageal reflux disease without esophagitis: Secondary | ICD-10-CM | POA: Diagnosis present

## 2010-09-28 DIAGNOSIS — I872 Venous insufficiency (chronic) (peripheral): Secondary | ICD-10-CM | POA: Diagnosis present

## 2010-09-28 DIAGNOSIS — F411 Generalized anxiety disorder: Secondary | ICD-10-CM | POA: Diagnosis present

## 2010-09-28 DIAGNOSIS — I4891 Unspecified atrial fibrillation: Secondary | ICD-10-CM | POA: Diagnosis present

## 2010-09-28 DIAGNOSIS — E042 Nontoxic multinodular goiter: Secondary | ICD-10-CM | POA: Diagnosis present

## 2010-09-28 DIAGNOSIS — T17408A Unspecified foreign body in trachea causing other injury, initial encounter: Secondary | ICD-10-CM | POA: Diagnosis present

## 2010-09-28 DIAGNOSIS — I70209 Unspecified atherosclerosis of native arteries of extremities, unspecified extremity: Secondary | ICD-10-CM | POA: Diagnosis present

## 2010-09-28 DIAGNOSIS — R05 Cough: Secondary | ICD-10-CM | POA: Diagnosis present

## 2010-09-28 DIAGNOSIS — Z902 Acquired absence of lung [part of]: Secondary | ICD-10-CM

## 2010-09-28 DIAGNOSIS — R059 Cough, unspecified: Secondary | ICD-10-CM | POA: Diagnosis present

## 2010-09-28 DIAGNOSIS — J449 Chronic obstructive pulmonary disease, unspecified: Secondary | ICD-10-CM

## 2010-09-28 DIAGNOSIS — Z87442 Personal history of urinary calculi: Secondary | ICD-10-CM

## 2010-09-28 DIAGNOSIS — E669 Obesity, unspecified: Secondary | ICD-10-CM | POA: Diagnosis present

## 2010-09-28 DIAGNOSIS — E785 Hyperlipidemia, unspecified: Secondary | ICD-10-CM | POA: Diagnosis present

## 2010-09-28 DIAGNOSIS — C3490 Malignant neoplasm of unspecified part of unspecified bronchus or lung: Secondary | ICD-10-CM | POA: Diagnosis present

## 2010-09-28 DIAGNOSIS — J9600 Acute respiratory failure, unspecified whether with hypoxia or hypercapnia: Secondary | ICD-10-CM

## 2010-09-28 DIAGNOSIS — I10 Essential (primary) hypertension: Secondary | ICD-10-CM

## 2010-09-28 DIAGNOSIS — M1990 Unspecified osteoarthritis, unspecified site: Secondary | ICD-10-CM | POA: Diagnosis present

## 2010-09-28 DIAGNOSIS — IMO0002 Reserved for concepts with insufficient information to code with codable children: Secondary | ICD-10-CM | POA: Diagnosis present

## 2010-09-28 DIAGNOSIS — E119 Type 2 diabetes mellitus without complications: Secondary | ICD-10-CM | POA: Diagnosis present

## 2010-09-28 DIAGNOSIS — Z8551 Personal history of malignant neoplasm of bladder: Secondary | ICD-10-CM

## 2010-09-28 DIAGNOSIS — M899 Disorder of bone, unspecified: Secondary | ICD-10-CM | POA: Diagnosis present

## 2010-09-28 LAB — CBC
MCHC: 34.2 g/dL (ref 30.0–36.0)
Platelets: 288 10*3/uL (ref 150–400)
RDW: 13.7 % (ref 11.5–15.5)

## 2010-09-28 LAB — PRO B NATRIURETIC PEPTIDE: Pro B Natriuretic peptide (BNP): 295.8 pg/mL — ABNORMAL HIGH (ref 0–125)

## 2010-09-28 LAB — DIFFERENTIAL
Basophils Absolute: 0.1 10*3/uL (ref 0.0–0.1)
Basophils Relative: 0 % (ref 0–1)
Eosinophils Absolute: 0.2 10*3/uL (ref 0.0–0.7)
Eosinophils Relative: 2 % (ref 0–5)
Monocytes Absolute: 0.9 10*3/uL (ref 0.1–1.0)
Neutrophils Relative %: 74 % (ref 43–77)

## 2010-09-28 LAB — CARDIAC PANEL(CRET KIN+CKTOT+MB+TROPI)
Relative Index: INVALID (ref 0.0–2.5)
Troponin I: <0.3 ng/mL (ref <0.30)

## 2010-09-28 LAB — COMPREHENSIVE METABOLIC PANEL
ALT: 25 U/L (ref 0–35)
Albumin: 3.7 g/dL (ref 3.5–5.2)
Alkaline Phosphatase: 38 U/L — ABNORMAL LOW (ref 39–117)
BUN: 21 mg/dL (ref 6–23)
Potassium: 4.4 mEq/L (ref 3.5–5.1)
Sodium: 137 mEq/L (ref 135–145)
Total Protein: 7.3 g/dL (ref 6.0–8.3)

## 2010-09-28 LAB — URINALYSIS, ROUTINE W REFLEX MICROSCOPIC
Hgb urine dipstick: NEGATIVE
Protein, ur: NEGATIVE mg/dL
Urobilinogen, UA: 0.2 mg/dL (ref 0.0–1.0)

## 2010-09-28 LAB — GLUCOSE, CAPILLARY

## 2010-09-28 MED ORDER — LEVALBUTEROL HCL 0.63 MG/3ML IN NEBU
0.6300 mg | INHALATION_SOLUTION | Freq: Once | RESPIRATORY_TRACT | Status: AC
  Administered 2010-09-28: 0.63 mg via RESPIRATORY_TRACT

## 2010-09-28 MED ORDER — LEVALBUTEROL HCL 0.63 MG/3ML IN NEBU: 1.0000 | INHALATION_SOLUTION | Freq: Once | RESPIRATORY_TRACT | Status: DC

## 2010-09-28 NOTE — Progress Notes (Signed)
I have reviewed information, examined patient and d/w [name].  She has COPD exacerbation and is to be admitted to [facility].

## 2010-09-28 NOTE — Progress Notes (Signed)
Addended by: Abigail Miyamoto D on: 09/28/2010 10:24 AM   Modules accepted: Orders

## 2010-09-28 NOTE — Assessment & Plan Note (Signed)
Will need SSI protocol.  Hold metformin for now.

## 2010-09-28 NOTE — Progress Notes (Signed)
Subjective:    Patient ID: [name], female    DOB: 1934-05-05, 76 y.o.   MRN: [id number]  HPI 76 y/o WF with known hx of HTN, COPD, DJD, and DM  ~ September 25, 2009: 105mo ROV- c/o cough "at least 2 times per day" & tongue sore... she has had follow ups w/ her specialists: DrRamos 5/11 for LBP to right leg & given epid steroid shot (improved)... DrBurney 6/11 CXR stable & CT planned in Aug, she had some dypnea which he related to the amt of lung tissue resected... DrGerkin 6/11 for bilat thyroid nodules w/ benign bx- f/u sonar w/ multinod goiter, no ch in nodules, TSH= WNL.[name]. she has f/u appt w/ DrMohammed in several weeks...   ~ December 25, 2009: Add-on appt for "sinus"- c/o right eye problem "it's allergy" & notes red, swollen, right sided facial pain & HA; notes drainage "it pours" mucus, congestion; hurts in her teeth down to her jaw, but denies fever, discolored phlegm or blood... 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 we discussed checking sinus XRays & treating her w/ Depo/ Pred, Augmentin, Mucinex >> if symptoms persist she will need Neuro f/u for ?atypic facial pain?   ~ April 12, 2010:  She notes mult somatic concerns> SOB- "it's my weight", Cluster HAs- improved off wine "hopefully they are gone for the next 64yr cycle", right great toenail infection surg by podiatry & finally improved but reactions to Kelflex & Septra w/ nausea & diarrhea...  She had CT 3/12 per DrMohamed> COPD & post surg changes, <1cm ground glass nodule LUL w/o change, stable/ NAD, hep steatosis; they plan continued watchful waiting...  BP controlled on meds> 140/76 today & similar at home; needs better diet & wt reduction to keep from having to incr her BP meds...  Chol looks fair on Fenoglide + FishOil as she is intol to all statins> TChol 215, TG 181, HDL 40, LDL 157...  DM control is adeq on  the Metformin alone> Bs=140, A1c=6.8, but wt is up 11# to 197# & we reviewed low carb, low chol/fat diets...  Thyroid> TSH=1.08 (not on meds) & she reports being released by DrGerkins, no change x64yrs...  LABS 04/09/10 also shows Vit D still low at 24 ?on 2000u daily supplement> she prefers 50K weekly & we wrote Rx... we discussed Shingles vaccine for her at her convenience...   ~Jul 05, 2010 Acute OV Pt presents for an acute office visit. She would like to be checked after fall 1 week ago. Pt tripped on escalator at [employer] on 06/28/10 and fell down the whole flight of the moving escalator with subsequent brusing and abrasions. Pt says she was checked out by EMS w/ no obvious fx. She had no trouble with weight bearing. NO LOC. HAd brusing along right shoulder, arm and face , Shins with several abrasions. She has been cleaning with soap/water and applying abx ointment. No drainage or fever. Minimal redness.   She has noticed she is developing a dry cough, wheezing  And more trouble breathing last 2 days. She is requesting a depo medrol shot and steroid taper. She is leaving for a 10 day  Trip to [place] next week. She is very concerned she will get worse. I have explained to her that steroids have a lot of side effects. She is very adamanant today that is the  only thing that helps her.  No discolored mucus or fever. >>tx w/ depo medrol shot and prednisone   08/03/10 Acute OV  Pt complains of  wheezing, increased SOB, prod cough with green mucus x3days. She is leaving for the beach tomorrow and "does not want to be sick so she can play with her grandchildren". Cough with thick green mucus initially. Called in Avelox 1 week ago x 7 days- now finished. Mucus is now clear but she still has cough  and wheezing. Husband has similar symptoms. No chest pain or hemoptysis. No leg swelling. Today cxr with no acute changes.  Pt was seen 1 month ago for similar symptoms she says did great after last ov with  improved activity tolerance. "I would not been able to make all the travel in [place] without the steroid shot". >>steroid taper /Depo medrol shot.    09/18/2010 Acute OV  PT returns for recurrent asthma flare . Complains of dry cough, wheezing and increased dyspnea/DOE over last week. Has been having intermittent headache and nausea at times. Wants something for nausea. She says she felt so good last visit after her steroid shot. She requests another steroid shot . We discussed complications of steroids. Went over triggers of asthma/COPD flares.  She is leaving to go out of town next week and does not want to miss her trip. No calf pain or swelling. No chest pain or hemoptysis.  CXR last visit with no acute changes. >>tx w/ steroid taper /depo medrol shot   09/28/2010 Acute OV  Pt presents to office for an acute office visit. Complains of extreme shortness of breath and wheezing. Seen 10 days ago with acute flare  Given steroid burst. Got some better but not as good as usual, had lingering cough and wheezing. Last evening at dinner got choked eating and since then has been extremely short of breath , wheezing and painful cough . On arrival O2 sat 81%, difficult to increase with 6 l/m O2 on [place] and 1 neb tx. Changed to NRB at 100% and after second tx with sats improved at 98-100%. She will be admitted to ICU at [facility]. EMS transporting pt. Pt evaluated by CCM doctor Dr. Craige Cotta .  PT husband aware and updated in office.      Review of Systems  Constitutional:   No  weight loss, night sweats,  Fevers, chills,  +fatigue, or  lassitude.  HEENT:     Difficulty swallowing,  Tooth/dental problems, or  Sore throat,                No sneezing, itching, ear ache, nasal congestion, post nasal drip,   CV:  No chest pain,  Orthopnea, PND, swelling in lower extremities, anasarca,  palpitations, syncope.   GI  No heartburn, indigestion, abdominal pain, vomiting, diarrhea, change in bowel habits, loss of  appetite, bloody stools.   Resp:    No coughing up of blood.   + wheezing.  No chest wall deformity  Skin: no rash or lesions.  GU: no dysuria, change in color of urine, no urgency or frequency.  No flank pain, no hematuria   MS:  .  No decreased range of motion.     Psych:  No change in mood or affect. No depression or anxiety   Neuro: neg for visual/speech changes, arm weakness          Objective:   Physical Exam  GEN: A/Ox3; pleasant , obese , resp distress, increased use of  accessory muscles.   HEENT:  [place]/AT,  EACs-clear, TMs-wnl, NOSE-clear, THROAT-clear, no lesions, no postnasal drip or exudate noted.   NECK:  Supple w/ fair ROM; no JVD; normal carotid impulses w/o bruits; no thyromegaly or nodules palpated; no lymphadenopathy.  RESP  Coarse BS w/  exp wheeze, decreaesd air movement . no accessory muscle use, no dullness to percussion  CARD:  RRR, no m/r/g  , tr peripheral edema, pulses intact, no cyanosis or clubbing.  GI:   Soft & nt; nml bowel sounds; no organomegaly or masses detected.  Musco: Warm bil, no deformities or joint swelling noted.   Neuro: alert, no focal deficits noted.    Skin: Warm, intact        Assessment & Plan:

## 2010-09-28 NOTE — Assessment & Plan Note (Signed)
Severe COPD exacerbation w/ associated acute hypoxic resp failure Will admit to ICU begin NEbs, IV solumedrol and empiric Antibiotics.  Check cardiac enzymes, labs and cxr w/ EKG.  CCM to admit.  Dr. Kriste Basque  Made aware

## 2010-09-28 NOTE — Assessment & Plan Note (Signed)
Hold b/p meds for now , add back in once stable and eval b/p.

## 2010-09-29 ENCOUNTER — Other Ambulatory Visit: Payer: Self-pay | Admitting: Critical Care Medicine

## 2010-09-29 ENCOUNTER — Inpatient Hospital Stay (HOSPITAL_COMMUNITY): Payer: Medicare Other

## 2010-09-29 DIAGNOSIS — T17408A Unspecified foreign body in trachea causing other injury, initial encounter: Secondary | ICD-10-CM

## 2010-09-29 DIAGNOSIS — T17808A Unspecified foreign body in other parts of respiratory tract causing other injury, initial encounter: Secondary | ICD-10-CM

## 2010-09-29 DIAGNOSIS — IMO0002 Reserved for concepts with insufficient information to code with codable children: Secondary | ICD-10-CM

## 2010-09-29 DIAGNOSIS — T17508A Unspecified foreign body in bronchus causing other injury, initial encounter: Secondary | ICD-10-CM

## 2010-09-29 LAB — BASIC METABOLIC PANEL
BUN: 21 mg/dL (ref 6–23)
CO2: 21 mEq/L (ref 19–32)
Chloride: 97 mEq/L (ref 96–112)
Creatinine, Ser: 0.68 mg/dL (ref 0.50–1.10)
Potassium: 3.5 mEq/L (ref 3.5–5.1)

## 2010-09-29 LAB — GLUCOSE, CAPILLARY
Glucose-Capillary: 268 mg/dL — ABNORMAL HIGH (ref 70–99)
Glucose-Capillary: 295 mg/dL — ABNORMAL HIGH (ref 70–99)
Glucose-Capillary: 254 mg/dL — ABNORMAL HIGH (ref 70–99)

## 2010-09-29 LAB — CBC
HCT: 43.6 % (ref 36.0–46.0)
MCV: 95.4 fL (ref 78.0–100.0)
Platelets: 329 10*3/uL (ref 150–400)
RBC: 4.57 MIL/uL (ref 3.87–5.11)
WBC: 9.6 10*3/uL (ref 4.0–10.5)

## 2010-09-30 ENCOUNTER — Inpatient Hospital Stay (HOSPITAL_COMMUNITY): Payer: Medicare Other

## 2010-09-30 DIAGNOSIS — T17508A Unspecified foreign body in bronchus causing other injury, initial encounter: Secondary | ICD-10-CM

## 2010-09-30 DIAGNOSIS — J441 Chronic obstructive pulmonary disease with (acute) exacerbation: Secondary | ICD-10-CM

## 2010-09-30 DIAGNOSIS — T17808A Unspecified foreign body in other parts of respiratory tract causing other injury, initial encounter: Secondary | ICD-10-CM

## 2010-09-30 DIAGNOSIS — IMO0002 Reserved for concepts with insufficient information to code with codable children: Secondary | ICD-10-CM

## 2010-09-30 DIAGNOSIS — T17408A Unspecified foreign body in trachea causing other injury, initial encounter: Secondary | ICD-10-CM

## 2010-09-30 DIAGNOSIS — J9600 Acute respiratory failure, unspecified whether with hypoxia or hypercapnia: Secondary | ICD-10-CM

## 2010-09-30 LAB — CBC
HCT: 38.9 % (ref 36.0–46.0)
Hemoglobin: 13.4 g/dL (ref 12.0–15.0)
WBC: 15.9 10*3/uL — ABNORMAL HIGH (ref 4.0–10.5)

## 2010-09-30 LAB — BASIC METABOLIC PANEL
BUN: 27 mg/dL — ABNORMAL HIGH (ref 6–23)
Chloride: 102 mEq/L (ref 96–112)
Glucose, Bld: 216 mg/dL — ABNORMAL HIGH (ref 70–99)
Potassium: 3.8 mEq/L (ref 3.5–5.1)

## 2010-09-30 LAB — GLUCOSE, CAPILLARY: Glucose-Capillary: 291 mg/dL — ABNORMAL HIGH (ref 70–99)

## 2010-10-01 ENCOUNTER — Telehealth: Payer: Self-pay | Admitting: Pulmonary Disease

## 2010-10-01 ENCOUNTER — Inpatient Hospital Stay (HOSPITAL_COMMUNITY): Payer: Medicare Other

## 2010-10-01 DIAGNOSIS — T17808A Unspecified foreign body in other parts of respiratory tract causing other injury, initial encounter: Secondary | ICD-10-CM

## 2010-10-01 DIAGNOSIS — J9600 Acute respiratory failure, unspecified whether with hypoxia or hypercapnia: Secondary | ICD-10-CM

## 2010-10-01 DIAGNOSIS — T17408A Unspecified foreign body in trachea causing other injury, initial encounter: Secondary | ICD-10-CM

## 2010-10-01 DIAGNOSIS — J441 Chronic obstructive pulmonary disease with (acute) exacerbation: Secondary | ICD-10-CM

## 2010-10-01 DIAGNOSIS — IMO0002 Reserved for concepts with insufficient information to code with codable children: Secondary | ICD-10-CM

## 2010-10-01 DIAGNOSIS — T17508A Unspecified foreign body in bronchus causing other injury, initial encounter: Secondary | ICD-10-CM

## 2010-10-01 LAB — BASIC METABOLIC PANEL
Calcium: 9.8 mg/dL (ref 8.4–10.5)
Chloride: 103 mEq/L (ref 96–112)
GFR calc Af Amer: >60 mL/min (ref >60)
GFR calc non Af Amer: >60 mL/min (ref >60)
Glucose, Bld: 189 mg/dL — ABNORMAL HIGH (ref 70–99)
Potassium: 3.5 mEq/L (ref 3.5–5.1)
Sodium: 138 mEq/L (ref 135–145)

## 2010-10-01 LAB — BLOOD GAS, ARTERIAL
Patient temperature: 98.6
TCO2: 20.7 mmol/L (ref 0–100)
pCO2 arterial: 33 mmHg — ABNORMAL LOW (ref 35.0–45.0)
pH, Arterial: 7.473 — ABNORMAL HIGH (ref 7.350–7.400)

## 2010-10-01 LAB — CBC
MCH: 31.8 pg (ref 26.0–34.0)
MCHC: 33.2 g/dL (ref 30.0–36.0)
Platelets: 242 10*3/uL (ref 150–400)
RBC: 4.12 MIL/uL (ref 3.87–5.11)
RDW: 13.7 % (ref 11.5–15.5)

## 2010-10-01 LAB — GLUCOSE, CAPILLARY: Glucose-Capillary: 176 mg/dL — ABNORMAL HIGH (ref 70–99)

## 2010-10-01 NOTE — Telephone Encounter (Signed)
Per SN---ok to give milk of magnesia now   30ml  And if no better by bedtime can give 2 dulcolax tablets.  Attempted to call dennis but the line is busy. Will try back later.

## 2010-10-02 ENCOUNTER — Telehealth: Payer: Self-pay | Admitting: Pulmonary Disease

## 2010-10-02 LAB — COMPREHENSIVE METABOLIC PANEL
ALT: 32 U/L (ref 0–35)
Alkaline Phosphatase: 40 U/L (ref 39–117)
BUN: 23 mg/dL (ref 6–23)
Chloride: 101 mEq/L (ref 96–112)
GFR calc Af Amer: >60 mL/min (ref >60)
Glucose, Bld: 141 mg/dL — ABNORMAL HIGH (ref 70–99)
Potassium: 3.5 mEq/L (ref 3.5–5.1)
Total Bilirubin: 0.3 mg/dL (ref 0.3–1.2)

## 2010-10-02 LAB — GLUCOSE, CAPILLARY: Glucose-Capillary: 145 mg/dL — ABNORMAL HIGH (ref 70–99)

## 2010-10-02 LAB — CBC
HCT: 41.9 % (ref 36.0–46.0)
MCH: 32.6 pg (ref 26.0–34.0)
MCV: 94.8 fL (ref 78.0–100.0)
Platelets: 258 10*3/uL (ref 150–400)
RBC: 4.42 MIL/uL (ref 3.87–5.11)
RDW: 13.8 % (ref 11.5–15.5)

## 2010-10-02 LAB — LIPID PANEL
LDL Cholesterol: 127 mg/dL — ABNORMAL HIGH (ref 0–99)
Total CHOL/HDL Ratio: 4.5 RATIO
Triglycerides: 276 mg/dL — ABNORMAL HIGH (ref <150)
VLDL: 55 mg/dL — ABNORMAL HIGH (ref 0–40)

## 2010-10-02 LAB — HEMOGLOBIN A1C: Mean Plasma Glucose: 140 mg/dL — ABNORMAL HIGH (ref <117)

## 2010-10-02 NOTE — Telephone Encounter (Signed)
Pt is coming in 9/5 at 11:30 for hfu with SN

## 2010-10-02 NOTE — Telephone Encounter (Signed)
Called the nurse for the pt today and she states nothing further needed, that pt is being discharged today. Carron Curie, CMA

## 2010-10-03 ENCOUNTER — Telehealth: Payer: Self-pay | Admitting: *Deleted

## 2010-10-03 NOTE — Telephone Encounter (Signed)
Pt returned my call and is aware of her appt on 8/30 for hfu.  Pt was instructed to bring all of her meds to this appt.

## 2010-10-04 ENCOUNTER — Encounter: Payer: Self-pay | Admitting: Pulmonary Disease

## 2010-10-04 ENCOUNTER — Ambulatory Visit (INDEPENDENT_AMBULATORY_CARE_PROVIDER_SITE_OTHER): Payer: Medicare Other | Admitting: Pulmonary Disease

## 2010-10-04 DIAGNOSIS — M1990 Unspecified osteoarthritis, unspecified site: Secondary | ICD-10-CM

## 2010-10-04 DIAGNOSIS — J449 Chronic obstructive pulmonary disease, unspecified: Secondary | ICD-10-CM

## 2010-10-04 DIAGNOSIS — J4489 Other specified chronic obstructive pulmonary disease: Secondary | ICD-10-CM

## 2010-10-04 DIAGNOSIS — E785 Hyperlipidemia, unspecified: Secondary | ICD-10-CM

## 2010-10-04 DIAGNOSIS — E042 Nontoxic multinodular goiter: Secondary | ICD-10-CM

## 2010-10-04 DIAGNOSIS — M899 Disorder of bone, unspecified: Secondary | ICD-10-CM

## 2010-10-04 DIAGNOSIS — E119 Type 2 diabetes mellitus without complications: Secondary | ICD-10-CM

## 2010-10-04 DIAGNOSIS — E669 Obesity, unspecified: Secondary | ICD-10-CM

## 2010-10-04 DIAGNOSIS — I4891 Unspecified atrial fibrillation: Secondary | ICD-10-CM

## 2010-10-04 DIAGNOSIS — J45909 Unspecified asthma, uncomplicated: Secondary | ICD-10-CM

## 2010-10-04 DIAGNOSIS — C3490 Malignant neoplasm of unspecified part of unspecified bronchus or lung: Secondary | ICD-10-CM

## 2010-10-04 DIAGNOSIS — F411 Generalized anxiety disorder: Secondary | ICD-10-CM

## 2010-10-04 DIAGNOSIS — I10 Essential (primary) hypertension: Secondary | ICD-10-CM

## 2010-10-04 MED ORDER — MOMETASONE FURO-FORMOTEROL FUM 200-5 MCG/ACT IN AERO: 2.0000 | INHALATION_SPRAY | Freq: Every day | RESPIRATORY_TRACT | Status: DC

## 2010-10-04 NOTE — Patient Instructions (Signed)
Today we updated your med list in EPIC...    We decided to continue w/ DULERA 2 puffs twice daily...    We decided to wean down the Prednisone to 1/2 tab daily for about 3 weeks, then decrease further to 1/2 tab every other day til gone...  OK to restart your Actonel & Fish Oil...  As instructed by the speech pathologist in the hosp:    Be sure to chew completely, small bites, & swallow carefully & deliberately so as NOT to choke on your food...    It would be helpful to elevate the head of your bed about 6" on blocks...    You should refrain from eating or drinking much after dinner in the eve (this will allow your stomach to empty completely before bedtime).  Let's plan a follow up visit in 6-8 weeks, sooner if needed for problems.Marland KitchenMarland Kitchen

## 2010-10-04 NOTE — Progress Notes (Signed)
Subjective:    Patient ID: [name], female    DOB: 06-11-1934, 76 y.o.   MRN: [id number]  HPI 76 y/o WF here for a follow up visit... she has mult medical specialists who follow all of her medical problems (see below)...  ~  September 25, 2009:  41mo ROV- c/o cough "at least 2 times per day" & tongue sore... she has had follow ups w/ her specialists:  DrRamos 5/11 for LBP to right leg & given epid steroid shot (improved)...  DrBurney 6/11 CXR stable & CT planned in Aug, she had some dypnea which he related to the amt of lung tissue resected...  DrGerkin 6/11 for bilat thyroid nodules w/ benign bx- f/u sonar w/ multinod goiter, no ch in nodules, TSH= WNL.[name].  she has f/u appt w/ DrMohammed in several weeks...  ~  December 25, 2009:  Add-on appt for "sinus"- c/o right eye problem "it's allergy" & notes red, swollen, right sided facial pain & HA;  notes drainage "it pours" mucus, congestion; hurts in her teeth down to her jaw, but denies fever, discolored phlegm or blood... 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  we discussed checking sinus XRays & treating her w/ Depo/ Pred, Augmentin, Mucinex >> if symptoms persist she will need Neuro f/u for ?atypic facial pain?  ~  April 12, 2010:  She notes mult somatic concerns> SOB- "it's my weight", Cluster HAs- improved off wine "hopefully they are gone for the next 81yr cycle", right great toenail infection surg by podiatry & finally improved but reactions to Kelflex & Septra w/ nausea & diarrhea...     She had CT 3/12 per DrMohamed> COPD & post surg changes, <1cm ground glass nodule LUL w/o change, stable/ NAD, hep steatosis;  they plan continued watchful waiting...    BP controlled on meds> 140/76 today & similar at home;  needs better diet & wt reduction to keep from having to incr her BP meds...    Chol looks fair on Fenoglide + FishOil  as she is intol to all statins> TChol 215, TG 181, HDL 40, LDL 157...    DM control is adeq on the Metformin alone>  Bs=140, A1c=6.8, but wt is up 11# to 197# & we reviewed low carb, low chol/fat diets...    Thyroid> TSH=1.08 (not on meds) & she reports being released by DrGerkins, no change x52yrs...    LABS 04/09/10 also shows Vit D still low at 24 ?on 2000u daily supplement> she prefers 50K weekly & we wrote Rx... we discussed Shingles vaccine for her at her convenience...  ~  October 04, 2010:  50mo ROV & post hosp visit>  She has had mult bronchitic exac over the interval, seen by TP w/ antibiotic & Pred rx; she has maintained a hectic personal life & travel schedule (she likes DepoMedrol shots prior to her trips;  Recent refractory AB episode w/o the usual improvement on Rx & reported choking on her dinner w/ incr dyspnea & hypoxemia requiring Hosp ==> responded slowly to Rx until she coughed up a chunk of onion w/ more rapid improvement after that;  disch on Pred taper, Dulera, Spiriva, Mucinex, etc; she was seen by speech path but no asp seen w/ any consistancy tested & rec for sm bites, chew carefully, swallow deliberately & slowly...     Since disch she's  been stable (see meds below) & PFT today w/ severe airflow obstruction & FEV1=1.08 (56%), %1sec=48...          Problem List:  ASTHMA (ICD-493.90) / COPD (ICD-496) - ex-smoker, quit 1997... Prev on Advair500 now DULERA200 2spBid (ch due to sore throat) & SPIRIVA 1/d + MUCINEX 2Bid & PROVENTIL Prn... she was participating in [facility] (last 3/09) & stopped on her own... may have a superimposed component of restriction due to obesity & prev lung surgeries... ~  11/05: s/p RULobectomy for lung ca- (adeno w/ bronchoalveolar cell features). ~  PFT 8/08 showed FVC=2.02 (77%), FEV1=1.07 (51%), %1sec=53, mid-flows=19%pred. ~  PFT 7/09 today= FVC=1.93 (72%), FEV1=1.04 (49%), %1sec=54, mid-flows=19%pred. ~  1/11:  s/p LUL resection by DrBurney>  multicentric bronchoalveolar cell cancer. ~  PFT 8/12 showed FVC=2.25 (88%), FEV1=1.08 (56%), %1sec=48, mid-flows=30% pred.  Hx of LUNG CANCER (ICD-162.9) - Hx multicentric bronchoalveolar cell lung cancer >> followed by DrBurney for CVTS & DrMohammed for Oncology... ~  s/p right upper lobectomy by DrBurney 11/05 for a stage 1A non-small cell lung cancer (adenocarcinoma w/ bronchalveolar cell features); post-op observation only... ~  CT Chest 11/08 showed no recurrence, mult bilat nodules unchanged x3+ yrs, nodular thyroid w/o change... ~  CXR 7/09 showed stable post-op changes and scarring on the right, NAD.[name]. ~  CTAngio Chest 10/09 showed neg for PE, prom thyroid, atherosclerotic changes in Ao, no change in ground-glass nodules in LUL area... ~  CT Chest 11/10 by DrMohammed showed new LUL solid nodule, no change in ground-glass areas... lesion was PET pos... ~  1/11:  s/p LULobectomy & node dissection via minithoracotomy by DrBurney- path showed 3 foci of well diff bronchoalveolar cell ca & neg nodes... decision made at conference for no chemoRx, EGFR assay was neg... ~  she continues to have monitoring by DrBurney/ DrMohammed> on observation alone.  HYPERTENSION (ICD-401.9) - on DIOVAN/Hct 160-12.5 Daily, VERAPAMIL SR 240mg /d, & LASIX 20mg  "Prn"... BP= 138/60 & feeling OK, tolerating Rx etc... denies CP, palipit, dizziness, syncope, ch in dyspnea, edema, etc...  ~  Cardiac eval 9/09 by Walker Kehr was neg and 2DEcho showed DD w/ norm LVsys function, EF= 60-65%, no regional wall motion abn...  PAROXYSMAL ATRIAL FIBRILLATION (ICD-427.31) - hx PAF in the post op period... converted to NSR & holding... transiently on Amiodarone & she wanted off Rx...  PERIPHERAL VASCULAR DISEASE (ICD-443.9) - she has atherosclerotic changes in her Ao noted on her prev scans...  ~  11/10: pt had mult questions about this problem on the prob list- discussed "hardening of the arteries" in detail.  VENOUS INSUFFICIENCY  (ICD-459.81) - she knows to follow a low sodium diet, elevate legs, wear support hose, etc; she insists on keeping Lasix 20mg  on hand for Prn use...  HYPERLIPIDEMIA (ICD-272.4) - prev on Livolo 2mg - 1/2 tab daily (stopped due to aching), +FENOGLIDE 120mg /d, FISH OIL & CoQ10  supplements... prev on Vytorin but INTOL ==> she states INTOL to all statins... she was not satis w/ the [facility]id Clinic in the past. ~  labs 8/08 off Vytorin showed TChol 183, TG 207, HDL 46, LDL 112... try fenofibrate... ~  FLP 5/09 on Feno120 showed TChol 188, TG 111, HDL 28, LDL 137... cont same, better diet, get wt down! ~  FLP 4/10 on Feno120 showed TChol 240, TG 104, HDL 49, LDL 165... I rec f/u [facility] Clinic, she declined. ~  FLP 7/10 on Feno120 showed TChol 222, TG 152, HDL 36, LDL 172... rec> try  LIVALO 2mg - 1/2 tab Qhs, she stopped. ~  FLP 11/10 on Feno120+FishOil showed TChol 253, TG 125, HDL 42, LDL 208... try LIVALO2mg - 1/2 tab/d & stay on it. ~  FLP 1/11 on Liv1mg +Feno120 showed TChol 170, TG 101, HDL 45, LDL 105... continue same. ~  3/11: she reports aching all over & DrBurney stopped the Livolo> contin diet + other meds. ~  FLP 8/11 showed TChol 225, TG 238, HDL 41, LDL 156... INTOL all statins, she'll do the best she can w/ diet. ~  FLP 8/12 in hosp showed TChol 234, TG 276, HDL 52, LDL 127... Counseled on low chol, low fat, wt reducing diet.  DIABETES MELLITUS (ICD-250.00) - started on METFORMIN 500mg Bid 4/10, but decr on her own to 1 daily 1/11... we had stressed the importance of diet- low carb/ low fat and weight reduction, along w/ her pulm rehab exercises... ~  labs 4/10 showed BS= 131, HgA1c= 7.0.Marland KitchenMarland Kitchen Metformin500Bid started. ~  labs 7/10 showed BS= 134, A1c= 6.0 ~  labs 11/10 showed BS= 148, A1c= 6.5 ~  1/11:  she cut the Metformin to 1/d due to nausea. ~  labs 8/11 showed BS= 137, A1c= 5.7.Marland KitchenMarland Kitchen continue same, get wt down. ~  Labs 8/12 in hosp on Metform500/d showed BS= 110-230, A1c=6.5  NONTOXIC  MULTINODULAR GOITER (ICD-241.1) - eval and rx by DrBalan for Endocrinology & DrGerkin for CCS; she is asymptomatic; dominant nodule was needled and benign; surg consult from DrGerkin- elected observation & he checks her yearly... ~  seen 6/10 by DrGerkin- f/u sonar w/o change in any of the nodules... ~  labs 7/10 showed TSH= 0.89 ~  seen 6/11 by DrGerkin- f/u sonar w/o change in nodules. ~  Labs 8/12 showed TSH= 0.047... Not on Thyroid meds, needs f/u DrBalan.  OBESITY (ICD-278.00) - obese w/ abd panniculus & we discussed diet + exercise strategies.[name]. ~  weight up to 205# 11/09- we discussed diet, calorie restriction, exercise, & get the weight down... ~  weight 4/10 = 198# ~  weight 7/10 = 194# ~  weight 11/10 = 196# ~  weight 2/11 = 184# (post op) ~  weight 8/11 = 181# ~  weight 11/11 = 186#... she needs to do better! ~  Weight 8/12 = 186#  GERD (ICD-530.81) - on PRILOSEC 20mg /d... ~  colonoscopy 7/09 by Rodena Medin showed 4 sm polyps= tubular adenoma on bx... f/u planned 3 yrs...  NEPHROLITHIASIS, HX OF (ICD-V13.01)  Hx of BLADDER CANCER (ICD-188.9) - had hematuria in 2001 & referred to DrPeterson... 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 all subseq cystoscopies have been neg- no recurrence. ~  she reports f/u w/ DrPeterson yearly & doing satis by her report.  DEGENERATIVE JOINT DISEASE (ICD-715.90) - s/p rotator cuff repair in 1991... s/p left TKR from DrGioffre in 2002...  OSTEOPENIA (ICD-733.90) - on ACTONEL 150/mo, ca++, MVI, etc... ~  labs 8/11 showed Vit D level = 22... rec> start Vit D supplement extra 02-1998 u daily...  ANXIETY (ICD-300.00) - on XANAX 0.25mg  Prn... she has signif hx of claustrophobia, but denies that she has any anxiety...   Past Surgical History  Procedure Date  . S/p right rotator cuff repair 1991    Dr. Meade Maw  . S/p turbt 10/01    Dr. Vonita Moss  . S/p left tkr 2002    Dr. Darrelyn Hillock  .  S/p right upper lobectomy for lung cancer 11/05    Dr. Edwyna Shell (bronchoalveolar cell  ca)  . 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 S/p d&c for removal of endometrial polyp (benign) 12/10    GYN    Outpatient Encounter Prescriptions as of 10/04/2010  Medication Sig Dispense Refill  . albuterol (PROVENTIL HFA) 108 (90 BASE) MCG/ACT inhaler Inhale 2 puffs into the lungs every 6 (six) hours as needed.        [name] aspirin 81 MG tablet Once a day       . Cholecalciferol (VITAMIN D3) 50000 UNITS CAPS Once capsule a week       . Cyanocobalamin (VITAMIN B 12 PO) Take by mouth. 1000 mcg once a day       . diphenhydrAMINE (BENADRYL) 25 MG tablet As needed       . FENOGLIDE 120 MG TABS TAKE 1 TABLET BY MOUTH ONCE DAILY  30 tablet  3  . furosemide (LASIX) 20 MG tablet Take 1 tablet daily as needed for swelling       . guaiFENesin (MUCINEX) 600 MG 12 hr tablet Take 1,200 mg by mouth 2 (two) times daily.        [name] ibuprofen (ADVIL,MOTRIN) 200 MG tablet Take 400 mg by mouth every 6 (six) hours as needed.        . metFORMIN (GLUCOPHAGE) 500 MG tablet take 1 tablet by mouth every morning  30 tablet  6  . Mometasone Furo-Formoterol Fum (DULERA) 200-5 MCG/ACT AERO Inhale 2 puffs into the lungs daily.  1 Inhaler  11  . Multiple Vitamin (MULTIVITAMIN PO) Take by mouth. Once a day       . Omega-3 Fatty Acids (FISH OIL) 1200 MG CAPS Take by mouth. Once a day      . omeprazole (PRILOSEC) 20 MG capsule Take 20 mg by mouth daily.        . ondansetron (ZOFRAN) 4 MG tablet Take 1 tablet (4 mg total) by mouth every 8 (eight) hours as needed for nausea.  30 tablet  0  . predniSONE (DELTASONE) 20 MG tablet Take as directed until gone      . risedronate (ACTONEL) 150 MG tablet        . SUMAtriptan (IMITREX) 50 MG tablet Once a day at the onset of headache       . SUMAtriptan (IMITREX) 6 MG/0.5ML SOLN As needed for cluster headaches       . tiotropium  (SPIRIVA) 18 MCG inhalation capsule Place 18 mcg into inhaler and inhale daily.        . valsartan-hydrochlorothiazide (DIOVAN-HCT) 160-12.5 MG per tablet Take 1 tablet by mouth daily.        . verapamil (CALAN-SR) 240 MG CR tablet Take 240 mg by mouth at bedtime.          Allergies                                          Pt states INTOL to ALL STATINS...  Allergen Reactions  . Cephalexin     REACTION: nausea and diarrhea  . Codeine     REACTION: nausea  . Epinephrine     REACTION: nervous  . Erythromycin     REACTION: all mycins cause yeast infections  . Morphine     REACTION: nausea  . Sulfamethoxazole W/Trimethoprim     REACTION: nausea and diarrhea  Current Medications, Allergies, Past Medical History, Past Surgical History, Family History, and Social History were reviewed in [employer] record.    Review of Systems         See HPI - all other systems neg except as noted...  The patient complains of hoarseness, dyspnea on exertion, headaches, muscle weakness, and difficulty walking.  The patient denies anorexia, fever, weight loss, weight gain, vision loss, decreased hearing, chest pain, syncope, peripheral edema, prolonged cough, hemoptysis, abdominal pain, melena, hematochezia, severe indigestion/heartburn, hematuria, incontinence, suspicious skin lesions, transient blindness, depression, unusual weight change, abnormal bleeding, enlarged lymph nodes, and angioedema.     Objective:   Physical Exam     WD, WN, 76 y/o  WF in NAD... GENERAL:  Alert & oriented; pleasant & cooperative... HEENT:  [place]/AT, EOM-wnl, PERRLA, EACs-clear, TMs-wnl, NOSE-clear, THROAT-clear & wnl. NECK:  Supple w/ fairROM; no JVD; normal carotid impulses w/o bruits; no thyromegaly or nodules palpated; no lymphadenopathy. CHEST:  Decr BS bilat; scat wheezing/ rhonchi bilat, no rales or consolidation; s/p VATS surg scar on right & mini thoracotomy scar on left... sl tender to palp  left chest wall... HEART:  Regular Rhythm; without murmurs/ rubs/ or gallops heard... ABDOMEN:  Obese w/ panniculus, soft & nontender; normal bowel sounds; no organomegaly or masses detected. EXT:  S/P left TKR; mild arthritic changes; no varicose veins/ +venous insuffic/ tr edema. NEURO:  CN's intact;  no focal neuro deficits... sl tender over right max sinus. DERM:  No lesions noted; no rash etc...   Assessment & Plan:   COPD/ AB/ restriction from 2 surgeries>  She has severe airflow obstruction on PFT & we discussed the improtance of: 1) regular med regimen w/ Dulera, Spiriva, Mucinex, etc;  2) Must lose weight;  3) must increase exercise program (she declines restart of PulmRehab)...  and I would add 4) careful swallowing, avoid asp, etc...  Hx Lung Cancer w/ 2 prev surgeries>  Followed regularly by DrBurney, DrMohammed...  HBP>  Controlled on  Diovan/HCT & Verapamil, continue same...  HYPERLIPID>  She is INTOL to all statins, on diet/ exercise/ Fenofib/ FishOil; recent FLP remains poorly controlled & she will attempt to improve diet & lose weight...  DM>  Control is adeq w/ Metformin 500mg /d & recent A1c= 6.5 but now on Pred taper due to refractory episode; discussed better low carb diet.  Multinod Thyroid>  Recent labs in hosp showed TSH= 0.047, this will need repeat & f/u DrBalan...  Obesity>  Diet + exercise are the keys here...  GERD>  Recent MBS by speech path w/o asp or penetration; she needs to eat more slowly, chew well, swallow carefully; rec to continue Prilosec, elev HOB, & NPO after dinner...  DJD/ Osteopenia>  OK to restart the Actonel weekly...  Anxiety>  Prev on Xanax as needed, out now & doesn't think she needs a nerve pill.Marland KitchenMarland Kitchen

## 2010-10-10 ENCOUNTER — Inpatient Hospital Stay: Payer: Medicare Other | Admitting: Pulmonary Disease

## 2010-10-10 ENCOUNTER — Telehealth: Payer: Self-pay | Admitting: Pulmonary Disease

## 2010-10-10 MED ORDER — FLUTICASONE-SALMETEROL 500-50 MCG/DOSE IN AEPB: 1.0000 | INHALATION_SPRAY | Freq: Two times a day (BID) | RESPIRATORY_TRACT | Status: DC

## 2010-10-10 NOTE — Telephone Encounter (Signed)
Per SN---ok to change from dulera back to advair 500   1 inhalation bid.  thanks

## 2010-10-10 NOTE — Telephone Encounter (Signed)
Pt aware to go back on advair 500 1 puff bid. rx has been sent and pt aware

## 2010-10-10 NOTE — Telephone Encounter (Signed)
Spoke with pt. She states that she would like to d/c dulera and go back on advair. She states that even with rinsing her mouth, her mouth stays sore and her breathing has not improved. She states that she was taken off of advair for the same reasons, sore mouth and breathing bad. Wants recs from SN. Please advise, thanks!

## 2010-10-11 ENCOUNTER — Ambulatory Visit: Payer: MEDICARE | Admitting: Pulmonary Disease

## 2010-10-11 ENCOUNTER — Telehealth: Payer: Self-pay | Admitting: Pulmonary Disease

## 2010-10-11 NOTE — Telephone Encounter (Signed)
Pt can bring the paperwork to the office  And drop it off and SN will review.  SN is seeing pts this afternoon and not sure that he will be able to sign today.  thanks

## 2010-10-11 NOTE — Telephone Encounter (Signed)
Called and spoke with pt.  Pt states she had to cancel recent beach trip d/t hospitalization from choking episode and increased sob.  Pt had f/u appt with SN on 8/30 where she had SN sign paperwork that she created to get reimbursed for the beach trip.  Pt now calling today stating the trip ins company will not accept that paperwork she created and will therefore need SN to sign different paperwork that the Trip Ins company is requiring.  SN, please advise if you will sign paperwork for pt.  Thanks.

## 2010-10-11 NOTE — Telephone Encounter (Signed)
Pt aware she must bring in paperwork for SN to review and she understands that this might not be done today.

## 2010-10-13 ENCOUNTER — Other Ambulatory Visit: Payer: Self-pay | Admitting: Pulmonary Disease

## 2010-10-15 ENCOUNTER — Ambulatory Visit: Payer: MEDICARE | Admitting: Pulmonary Disease

## 2010-10-15 ENCOUNTER — Other Ambulatory Visit (HOSPITAL_COMMUNITY): Payer: MEDICARE

## 2010-10-29 ENCOUNTER — Ambulatory Visit (HOSPITAL_COMMUNITY)
Admission: RE | Admit: 2010-10-29 | Discharge: 2010-10-29 | Disposition: A | Discharge status: Home / Self Care | Payer: Medicare Other | Source: Ambulatory Visit | Attending: Internal Medicine | Admitting: Internal Medicine

## 2010-10-29 DIAGNOSIS — M4000 Postural kyphosis, site unspecified: Secondary | ICD-10-CM | POA: Insufficient documentation

## 2010-10-29 DIAGNOSIS — I2510 Atherosclerotic heart disease of native coronary artery without angina pectoris: Secondary | ICD-10-CM | POA: Insufficient documentation

## 2010-10-29 DIAGNOSIS — Z09 Encounter for follow-up examination after completed treatment for conditions other than malignant neoplasm: Secondary | ICD-10-CM | POA: Insufficient documentation

## 2010-10-29 DIAGNOSIS — K7689 Other specified diseases of liver: Secondary | ICD-10-CM | POA: Insufficient documentation

## 2010-10-29 DIAGNOSIS — I2584 Coronary atherosclerosis due to calcified coronary lesion: Secondary | ICD-10-CM | POA: Insufficient documentation

## 2010-10-29 DIAGNOSIS — C3490 Malignant neoplasm of unspecified part of unspecified bronchus or lung: Secondary | ICD-10-CM | POA: Insufficient documentation

## 2010-10-29 DIAGNOSIS — I700 Atherosclerosis of aorta: Secondary | ICD-10-CM | POA: Insufficient documentation

## 2010-10-29 MED ORDER — IOHEXOL 300 MG/ML  SOLN
100.0000 mL | Freq: Once | INTRAMUSCULAR | Status: AC | PRN
  Administered 2010-10-29: 100 mL via INTRAVENOUS

## 2010-10-31 ENCOUNTER — Encounter (HOSPITAL_BASED_OUTPATIENT_CLINIC_OR_DEPARTMENT_OTHER): Payer: Medicare Other | Admitting: Internal Medicine

## 2010-10-31 DIAGNOSIS — C3410 Malignant neoplasm of upper lobe, unspecified bronchus or lung: Secondary | ICD-10-CM

## 2010-11-01 ENCOUNTER — Ambulatory Visit: Payer: Self-pay | Admitting: Pulmonary Disease

## 2010-11-10 ENCOUNTER — Other Ambulatory Visit: Payer: Self-pay | Admitting: Pulmonary Disease

## 2010-11-16 ENCOUNTER — Telehealth: Payer: Self-pay | Admitting: Pulmonary Disease

## 2010-11-16 MED ORDER — CLOTRIMAZOLE 10 MG MT TROC: OROMUCOSAL | Status: DC

## 2010-11-16 MED ORDER — DIPHENHYD-HYDROCORT-NYSTATIN MT SUSP: OROMUCOSAL | Status: DC

## 2010-11-16 NOTE — Telephone Encounter (Signed)
Pt aware of TP recs and pt verbalized understanding. rx has been sent for pt and her refill for her MMW

## 2010-11-16 NOTE — Telephone Encounter (Signed)
Encourage on brush/rinse and gargle after inhaler use  Mycelex troche 1 troche five times a day for 7 days # 35 , no refills , Please contact office for sooner follow up if symptoms do not improve or worsen or seek emergency care

## 2010-11-16 NOTE — Telephone Encounter (Signed)
I spoke with pt and she c/o sore on her tongue, white puss on her tongue, gums are sore x 1 week. Pt states she knows it is thrush. Pt states she knows it is also coming from her advair. Pt states she has been through 2 bottle of MMW. Pt states the pharmacy advised her their was something stronger and better than MMW that could be called in for her and pt is requesting this. Pt also requesting a refill on her MMW as well. Please advise Tammy, thanks  Allergies  Allergen Reactions  . Cephalexin     REACTION: nausea and diarrhea  . Codeine     REACTION: nausea  . Epinephrine     REACTION: nervous  . Erythromycin     REACTION: all mycins cause yeast infections  . Morphine     REACTION: nausea  . Sulfamethoxazole W/Trimethoprim     REACTION: nausea and diarrhea    Carver Fila, CMA

## 2010-11-22 ENCOUNTER — Ambulatory Visit: Payer: Medicare Other | Admitting: Pulmonary Disease

## 2010-11-26 ENCOUNTER — Telehealth: Payer: Self-pay | Admitting: Pulmonary Disease

## 2010-11-26 NOTE — Telephone Encounter (Signed)
Ok to offer appt with TP.  thanks

## 2010-11-26 NOTE — Telephone Encounter (Signed)
[name], pt requesting an OV NOT on Oct 31 because this is when she will be getting the injection in her back.  Do you have anything else available to offer?  Pls advise - Thanks!

## 2010-11-26 NOTE — Telephone Encounter (Signed)
Called, spoke with pt.  She was seen by Dr. Kriste Basque on 10/04/10 and was told to follow up in 6-8 wks.  She scheduled an OV with Dr. Kriste Basque for Oct 31.  Last week she her her back and will need a shot for this.  Pt states the only time they can do this is on Oct 31.  However, she is not wanting to wait until Dr. Jodelle Green first available which in the end of Nov.  Pt states her breathing is worse since last OV with Dr. Kriste Basque - is having increased SOB and "can't hardly do anything  Without SOB."  Pt is taking advair - not dulera.  States she also has sores in her mouth and white bumps on her tongue.  She will be out of town on Nov 19-26 so will not be able to come in during this time.  Dr. Ernest Mallick, pls advise if pt can be worked in around Oct 31 - Thanks!

## 2010-11-26 NOTE — Telephone Encounter (Signed)
SN has a hold spot that can be used on 10-31 for an appt for this pt.  thanks
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
## 2010-11-29 ENCOUNTER — Other Ambulatory Visit: Payer: Self-pay | Admitting: Pulmonary Disease

## 2010-11-30 NOTE — Discharge Summary (Signed)
[name], DEDIC NO.:  [id number]  MEDICAL RECORD NO.:  [id number]  LOCATION:  1508                         FACILITY:  [facility]  PHYSICIAN:  Lonzo Cloud. Kriste Basque, MD     DATE OF BIRTH:  06/19/1934  DATE OF ADMISSION:  09/28/2010 DATE OF DISCHARGE:  10/02/2010                              DISCHARGE SUMMARY   FINAL DIAGNOSES: 1. Admitted on September 28, 2010, with severe refractory asthmatic     bronchitis.  This was precipitated by choking episode the night     before.  During the hospitalization, the patient expectorated a     piece of onion. 2. History of chronic obstructive asthma and underlying chronic     obstructive pulmonary disease. 3. History of lung cancer. 4. Hypertension. 5. History of paroxysmal atrial fibrillation. 6. Arteriosclerotic peripheral vascular disease. 7. Chronic venous insufficiency. 8. Hyperlipidemia. 9. Diabetes mellitus. 10.Nontoxic multinodular goiter. 11.Obesity. 12.Gastroesophageal reflux disease. 13.History of nephrolithiasis. 14.History of bladder cancer. 15.Degenerative arthritis. 16.Osteopenia. 17.Anxiety.  BRIEF HISTORY AND PHYSICAL:  The patient is a 76 year old white female, former smoker with history of COPD and chronic asthma.  She has a history of two previous lung cancer resections by Dr. Edwyna Shell.  She presented to the office on September 28, 2010, with severe shortness of breath and cough.  She states that while eating rice during dinner the night before, she "choked" and began coughing extensively.  Her shortness of breath worsened through the night and was productive of small amount of white sputum.  There was no hemoptysis and no expectoration of food particles according to the patient.  In the office, she was found be hypoxemic, saturations in the 80s.  She was given albuterol nebulizer treatment and admitted to the step-down unit for close observation.  As noted, she has COPD and obstructive asthma.  She has  been on combination therapy with Dulera, Spiriva, Proventil, Mucinex, and frequently requires antibiotics and prednisone.  She is a former smoker and quit several years ago.  She continued to be followed by Dr. Edwyna Shell and by Dr. Arbutus Ped for lung cancers.  She has multiple medical problems as noted above.  PHYSICAL EXAMINATION:  GENERAL:  At the time of admission revealed an elderly white female in moderate distress from dyspnea. VITAL SIGNS:  Blood pressure 150/80, pulse 110 and regular, and respirations 22 per minute and shallow. HEENT EXAM:  Dry mucous membranes, otherwise negative. NECK EXAM:  Showed no jugular distention, no carotid bruits, no thyromegaly or lymphadenopathy. CHEST EXAM:  Revealed coarse rhonchi and expiratory wheezing throughout. Decreased air movement.  No rales or signs of consolidation. CARDIAC EXAM:  Revealed regular tachycardia, grade 1/6 systolic ejection murmur in the left sternal border.  No rubs or gallops heard. ABDOMEN:  Soft and nontender without evidence of organomegaly or masses. EXTREMITIES:  Showed no cyanosis, clubbing, or edema. NEUROLOGIC EXAM:  Intact.  LABORATORY DATA:  Chest x-ray showed borderline cardiomegaly, some increased markings at the lung bases and diffuse osteopenia, no acute abnormalities.  Serial films were stable without acute disease identified.  She had a barium swallow from Speech Pathology revealing some delayed clearance and decreased peristalsis with outpatient awareness.  There was no aspiration or penetration with a consistency tested, however.  CBC showed hemoglobin 14.3, hematocrit 41.8, white count 12,400 with 74% segs.  Chemistry studies showed a sodium of 137, potassium 4.4, chloride 99, CO2 of 27, BUN 21, creatinine 0.63, blood sugar 114, total bilirubin 0.5, alk phos 38, SGOT 18, SGPT 25, total protein 7.3, albumin 3.7, and calcium 10.3.  ProBNP was 296 (normal under 125).  Cardiac panel was negative with negative  CPK and MB and troponin.  Urinalysis was clear.  Arterial blood gas showed a pH of 7.47, pCO2 of 33, pO2 of 53, on 2 liters of oxygen by nasal cannula.  HOSPITAL COURSE:  The patient was admitted to the step-down unit because of severe dyspnea.  She was placed on inhaled nebulizer treatments with albuterol and Atrovent.  She was given IV Solu-Medrol and Rocephin.  She was given oxygen and titrated to a saturation of 90%.  She had quite a bit of coughing and received Tussionex and Tessalon Perles.  Mucinex was added to her regimen.  She was given subcu heparin and sugars were monitored on sliding scale.  During one of her bedside assessments, the patient coughed a large junk of some kind of material.  On examination, this appeared to be a piece of an onion.  Indeed, this was sent to Pathology and found to be vegetable matter.  She continued to improve, after that was transferred to the ward.  She was transitioned back to her home regimen and felt to be MHB and ready for discharge on October 02, 2010.  MEDICATIONS AT DISCHARGE: 1. Prednisone 20 mg p.o. daily. 2. Dulera 2 puffs b.i.d. 3. Spiriva 1 capsule inhaled daily. 4. Proventil HFA 2 puffs q.4 hours as needed. 5. Mucinex 2 tablets p.o. b.i.d. with plenty of fluids. 6. Enteric-coated aspirin 81 mg p.o. daily. 7. Verapamil 240 mg p.o. daily. 8. Diovan HCT 160-12.5 one tablet p.o. daily. 9. Lasix 20 mg p.o. p.r.n. for edema. 10.Fenoglide 120 mg p.o. daily. 11.Fish oil daily. 12.Metformin 500 mg p.o. daily. 13.Actonel 35 mg p.o. q.week. 14.Calcium supplement daily. 15.Multivitamin daily. 16.B12 1000 mcg daily. 17.Vitamin D 50,000 units weekly. 18.Advil 2 tablets every 6 hours as needed for pain. 19.Zofran 4 mg every 6 hours as needed for nausea. 20.Prilosec OTC 20 mg p.o. daily.  She will continue with her Imitrex     and sumatriptan as needed for cluster headaches.  CONDITION ON DISCHARGE:  Improved.  DISPOSITION:  The  patient is discharged home.  We will follow up in the office in 1 week.     Lonzo Cloud. Kriste Basque, MD     SMN/MEDQ  D:  10/02/2010  T:  10/02/2010  Job:  [id number]  Electronically Signed by Alroy Dust MD on 11/30/2010 04:48:06 PM

## 2010-12-03 ENCOUNTER — Telehealth: Payer: Self-pay | Admitting: Pulmonary Disease

## 2010-12-03 DIAGNOSIS — E119 Type 2 diabetes mellitus without complications: Secondary | ICD-10-CM

## 2010-12-03 DIAGNOSIS — I10 Essential (primary) hypertension: Secondary | ICD-10-CM

## 2010-12-03 DIAGNOSIS — E785 Hyperlipidemia, unspecified: Secondary | ICD-10-CM

## 2010-12-03 NOTE — Telephone Encounter (Signed)
Per SN---ok for pt to have bmp,a1c,tsh,free t3 and free t4 checked.  thanks

## 2010-12-03 NOTE — Telephone Encounter (Signed)
Pt wants to know does she need labs drawn for her upcoming appt? If so she wants to come in early for theses. Please advise if labs are needed. Thanks.  Carron Curie, CMA

## 2010-12-03 NOTE — Telephone Encounter (Signed)
Pt aware that labs have been placed and will come by on Tuesday to have drawn.

## 2010-12-04 ENCOUNTER — Other Ambulatory Visit (INDEPENDENT_AMBULATORY_CARE_PROVIDER_SITE_OTHER): Payer: Medicare Other

## 2010-12-04 DIAGNOSIS — E785 Hyperlipidemia, unspecified: Secondary | ICD-10-CM

## 2010-12-04 DIAGNOSIS — E119 Type 2 diabetes mellitus without complications: Secondary | ICD-10-CM

## 2010-12-04 DIAGNOSIS — I10 Essential (primary) hypertension: Secondary | ICD-10-CM

## 2010-12-04 LAB — BASIC METABOLIC PANEL
BUN: 20 mg/dL (ref 6–23)
CO2: 26 mEq/L (ref 19–32)
Calcium: 10.1 mg/dL (ref 8.4–10.5)
Creatinine, Ser: 0.8 mg/dL (ref 0.4–1.2)
GFR: 76.72 mL/min (ref >60.00)
Glucose, Bld: 141 mg/dL — ABNORMAL HIGH (ref 70–99)
Sodium: 142 mEq/L (ref 135–145)

## 2010-12-04 LAB — T3, FREE: T3, Free: 3.2 pg/mL (ref 2.3–4.2)

## 2010-12-05 ENCOUNTER — Ambulatory Visit: Payer: Medicare Other | Admitting: Pulmonary Disease

## 2010-12-05 ENCOUNTER — Encounter: Payer: Self-pay | Admitting: Pulmonary Disease

## 2010-12-05 ENCOUNTER — Ambulatory Visit (INDEPENDENT_AMBULATORY_CARE_PROVIDER_SITE_OTHER): Payer: Medicare Other | Admitting: Pulmonary Disease

## 2010-12-05 DIAGNOSIS — C3490 Malignant neoplasm of unspecified part of unspecified bronchus or lung: Secondary | ICD-10-CM

## 2010-12-05 DIAGNOSIS — I4891 Unspecified atrial fibrillation: Secondary | ICD-10-CM

## 2010-12-05 DIAGNOSIS — K219 Gastro-esophageal reflux disease without esophagitis: Secondary | ICD-10-CM

## 2010-12-05 DIAGNOSIS — E785 Hyperlipidemia, unspecified: Secondary | ICD-10-CM

## 2010-12-05 DIAGNOSIS — I872 Venous insufficiency (chronic) (peripheral): Secondary | ICD-10-CM

## 2010-12-05 DIAGNOSIS — E119 Type 2 diabetes mellitus without complications: Secondary | ICD-10-CM

## 2010-12-05 DIAGNOSIS — J45909 Unspecified asthma, uncomplicated: Secondary | ICD-10-CM

## 2010-12-05 DIAGNOSIS — E669 Obesity, unspecified: Secondary | ICD-10-CM

## 2010-12-05 DIAGNOSIS — E042 Nontoxic multinodular goiter: Secondary | ICD-10-CM

## 2010-12-05 DIAGNOSIS — J449 Chronic obstructive pulmonary disease, unspecified: Secondary | ICD-10-CM

## 2010-12-05 DIAGNOSIS — M545 Low back pain, unspecified: Secondary | ICD-10-CM | POA: Insufficient documentation

## 2010-12-05 DIAGNOSIS — M1990 Unspecified osteoarthritis, unspecified site: Secondary | ICD-10-CM

## 2010-12-05 DIAGNOSIS — F411 Generalized anxiety disorder: Secondary | ICD-10-CM

## 2010-12-05 DIAGNOSIS — I10 Essential (primary) hypertension: Secondary | ICD-10-CM

## 2010-12-05 NOTE — Patient Instructions (Signed)
Today we updated your med list in our EPIC system...    Continue your current medications the same...    Be sure you take the ADVAIR twice daily...    Let me know if you want to try the ACCOLATE Rx...  Please try the ALPRAZOLAM 0.25mg  three times daily (morn, mid-day, eve) to relax the chest wall muscles and allow for a deeper more satisfying breath; it should also help the cramps in your hands & feet...  Continue the treatment program from [facility]...    Getting your back better is important to allow yopu to start exercising regularly again...    Exercise & weight reduction are critically important for you!!!  Remember to eliminate all the salt/ sodium from your diet...  Call for any questions...  Let's plan a follow up eval in  3 months.Marland KitchenMarland Kitchen

## 2010-12-06 ENCOUNTER — Ambulatory Visit: Payer: Medicare Other | Admitting: Adult Health

## 2010-12-16 ENCOUNTER — Encounter: Payer: Self-pay | Admitting: Pulmonary Disease

## 2010-12-16 NOTE — Progress Notes (Signed)
Subjective:    Patient ID: [name], female    DOB: 1934-07-04, 76 y.o.   MRN: [id number]  HPI  76 y/o WF here for a follow up visit... she has mult medical specialists who follow all of her medical problems (see below)...  ~  September 25, 2009:  61mo ROV- c/o cough "at least 2 times per day" & tongue sore... she has had follow ups w/ her specialists:  DrRamos 5/11 for LBP to right leg & given epid steroid shot (improved)...  DrBurney 6/11 CXR stable & CT planned in Aug, she had some dypnea which he related to the amt of lung tissue resected...  DrGerkin 6/11 for bilat thyroid nodules w/ benign bx- f/u sonar w/ multinod goiter, no ch in nodules, TSH= WNL.[name].  she has f/u appt w/ DrMohammed in several weeks...  ~  December 25, 2009:  Add-on appt for "sinus"- c/o right eye problem "it's allergy" & notes red, swollen, right sided facial pain & HA;  notes drainage "it pours" mucus, congestion; hurts in her teeth down to her jaw, but denies fever, discolored phlegm or blood... 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  we discussed checking sinus XRays & treating her w/ Depo/ Pred, Augmentin, Mucinex >> if symptoms persist she will need Neuro f/u for ?atypic facial pain?  ~  April 12, 2010:  She notes mult somatic concerns> SOB- "it's my weight", Cluster HAs- improved off wine "hopefully they are gone for the next 16yr cycle", right great toenail infection surg by podiatry & finally improved but reactions to Kelflex & Septra w/ nausea & diarrhea...     She had CT 3/12 per DrMohamed> COPD & post surg changes, <1cm ground glass nodule LUL w/o change, stable/ NAD, hep steatosis;  they plan continued watchful waiting...    BP controlled on meds> 140/76 today & similar at home;  needs better diet & wt reduction to keep from having to incr her BP meds...    Chol looks fair on Fenoglide +  FishOil as she is intol to all statins> TChol 215, TG 181, HDL 40, LDL 157...    DM control is adeq on the Metformin alone>  Bs=140, A1c=6.8, but wt is up 11# to 197# & we reviewed low carb, low chol/fat diets...    Thyroid> TSH=1.08 (not on meds) & she reports being released by DrGerkins, no change x101yrs...    LABS 04/09/10 also shows Vit D still low at 24 ?on 2000u daily supplement> she prefers 50K weekly & we wrote Rx... we discussed Shingles vaccine for her at her convenience...  ~  October 04, 2010:  39mo ROV & post hosp visit>  She has had mult bronchitic exac over the interval, seen by TP w/ antibiotic & Pred rx; she has maintained a hectic personal life & travel schedule (she likes DepoMedrol shots prior to her trips;  Recent refractory AB episode w/o the usual improvement on Rx & reported choking on her dinner w/ incr dyspnea & hypoxemia requiring Hosp ==> responded slowly to Rx until she coughed up a chunk of onion w/ more rapid improvement after that;  disch on Pred taper, Dulera, Spiriva, Mucinex, etc; she was seen by speech path but no asp seen w/ any consistancy tested & rec for sm bites, chew carefully, swallow deliberately & slowly...     Since disch  she's been stable (see meds below) & PFT today w/ severe airflow obstruction & FEV1=1.08 (56%), %1sec=48...  ~  December 05, 2010:  57mo ROV & she notes incr SOB w/ activ, & states she never got back to her prev baseline after last hosp & upset because she missed 2 trips; she notes that she hurt her back & hasn't been exercising because of this- since then incr SOB w/ some ADLs and incr edema in her legs;  She notes that she "can't get a deep breath" & she appears more anxious than usual w/ mult somatic complaints- indigestion, cramps in hands & feet, etc...  We discussed taking the Alprazolam 0.25mg  Tid regularly for her chest wall musc spasm so she can get a good deep breath & see how this works (she is asking about Accolate because a friend is on it  & it really helped them)...    COPD w/ revers component> on Advair500 but only using it once/d (she stopped [facility] stating it made her mouth sore), Spiriva, Mucinex, & Proventil HFA; asked (again) to maximize her regimen w/ AdvairBid, Mucinex2Bid, Fluids, etc; last PFT w/ FEV1=1.08 stable; see above...    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 NOTE: element of lung restriction from surg & obesity...    HBP> on DiovanHCT, Verapamil, & Lasix prn; BP= 144/80 & similar at home; she has mult somatic complaints...    Ven Insuffic> she knows to elim sodium but she eats out often, elev legs, wear support hose; she has been taking the Lasix20mg  daily due to the swelling & improved...     Hyperlipid> on Fenoglide120 & FishOil; she states INTOL to all statins; last FLP 8/12 in hosp as below; asked to ret for fasting blood work...    DM> on Metform500/d; BS=141, A1c=7.0; she will need more meds if she can't get on diet 7 get weight down...    Obesity> wt=198# which is up 12# in the last 57mo!!! We reviewed low carb, low fat, wt reducing diet 7 exercise prescription...    Multinod Goiter> prev eval from [place] DrGerkin; TFTs showed TSH=0.97, and FreeT3/ FreeT4 are wnl...    GI> GERD, Polyps> on Prilosec & had episode of choking on dinner w/ resp exac- see speech path eval; followed by DrGessner for GI w/ 62yr f/u colonoscopy due soon...    GU> Bladder Cancer> Papilllary TCCa resected 2001 & no recurrence; followed by DrPeterson w/ yearly cystos neg...    DJD/ LBP> on musc relaxer & Advil; prev rotator cuff surg 1991, left TKR 2002, now followed by DrRamos for LBP & plans shot in back (she reports it's improved spontaneously)...    Osteopenia> on Actonel, calcium, MVI, Vit D; ?when her last BMD was done?     Anxiety> as noted- she has Alpraz for prn use but doesn't take it often...          Problem List:  ASTHMA (ICD-493.90) / COPD (ICD-496) - ex-smoker, quit 1997... Prev on Advair500 now DULERA200 2spBid (ch due to sore throat) & SPIRIVA 1/d + MUCINEX 2Bid & PROVENTIL Prn... she was participating in [facility] (last 3/09) & stopped on her own... may have a superimposed component of restriction due to obesity & prev lung surgeries... ~  11/05: s/p RULobectomy for lung ca- (adeno w/ bronchoalveolar  cell features). ~  PFT 8/08 showed FVC=2.02 (77%), FEV1=1.07 (51%), %1sec=53, mid-flows=19%pred. ~  PFT 7/09 today= FVC=1.93 (72%), FEV1=1.04 (49%), %1sec=54, mid-flows=19%pred. ~  1/11:  s/p LUL resection by DrBurney> multicentric bronchoalveolar cell cancer. ~  PFT 8/12 showed FVC=2.25 (88%), FEV1=1.08 (56%), %1sec=48, mid-flows=30% pred. ~  10/12:  She reports Dulera caused sore mouth so she switched back to Advair500 but asked to do this Bid regularly.  Hx of LUNG CANCER (ICD-162.9) - Hx multicentric bronchoalveolar cell lung cancer >> followed by DrBurney for CVTS & DrMohammed for Oncology... ~  s/p right upper lobectomy by DrBurney 11/05 for a stage 1A non-small cell lung cancer (adenocarcinoma w/ bronchalveolar cell features); post-op observation only... ~  CT Chest 11/08 showed no recurrence, mult bilat nodules unchanged x3+ yrs, nodular thyroid w/o change... ~  CXR 7/09 showed stable post-op changes and scarring on the right, NAD.[name]. ~  CTAngio Chest 10/09 showed neg for PE, prom thyroid, atherosclerotic changes in Ao, no change in ground-glass nodules in LUL area... ~  CT Chest 11/10 by DrMohammed showed new LUL solid nodule, no change in ground-glass areas... lesion was PET pos... ~  1/11:  s/p LULobectomy & node dissection via minithoracotomy by DrBurney- path showed 3 foci of well diff bronchoalveolar cell ca & neg nodes... decision made at conference for no chemoRx, EGFR assay was  neg... ~  she continues to have monitoring by DrBurney/ DrMohammed> on observation alone. ~  Last CT Chest 9/12 showed stable 9mm ground glass opac LUL w/o change from 3/12 scan; +post op changes, COPD, coronary calcif, & hep steatosis...  HYPERTENSION (ICD-401.9) - on DIOVAN/Hct 160-12.5 Daily, VERAPAMIL SR 240mg /d, & LASIX 20mg  "Prn"... BP= 144/80 & feeling OK, tolerating Rx etc... denies CP, palipit, dizziness, syncope, ch in dyspnea, edema, etc...  ~  Cardiac eval 9/09 by Walker Kehr was neg and 2DEcho showed DD w/ norm LVsys function, EF= 60-65%, no regional wall motion abn...  PAROXYSMAL ATRIAL FIBRILLATION (ICD-427.31) - hx PAF in the post op period... converted to NSR & holding... transiently on Amiodarone & she wanted off Rx...  PERIPHERAL VASCULAR DISEASE (ICD-443.9) - she has atherosclerotic changes in her Ao noted on her prev scans...  ~  11/10: pt had mult questions about this problem on the prob list- discussed "hardening of the arteries" in detail.  VENOUS INSUFFICIENCY (ICD-459.81) - she knows to follow a low sodium diet, elevate legs, wear support hose, etc; she insists on keeping Lasix 20mg  on hand for Prn use...  HYPERLIPIDEMIA (ICD-272.4) - prev on Livolo 2mg - 1/2 tab daily (stopped due to aching), +FENOGLIDE 120mg /d, FISH OIL & CoQ10  supplements... prev on Vytorin but INTOL ==> she states INTOL to all statins... she was not satis w/ the Lipid Clinic in the past. ~  labs 8/08 off Vytorin showed TChol 183, TG 207, HDL 46, LDL 112... try fenofibrate... ~  FLP 5/09 on Feno120 showed TChol 188, TG 111, HDL 28, LDL 137... cont same, better diet, get wt down! ~  FLP 4/10 on Feno120 showed TChol 240, TG 104, HDL 49, LDL 165... I rec f/u Lipid Clinic, she declined. ~  FLP 7/10 on Feno120 showed TChol 222, TG 152, HDL 36, LDL 172... rec> try LIVALO 2mg - 1/2 tab Qhs, she stopped. ~  FLP 11/10 on Feno120+FishOil showed TChol 253, TG 125, HDL 42, LDL 208... try LIVALO2mg - 1/2 tab/d & stay on  it. ~  FLP 1/11 on Liv1mg +Feno120 showed TChol 170, TG 101, HDL 45, LDL 105... continue same. ~  3/11: she reports aching all over & DrBurney stopped the Livolo> contin diet + other meds. ~  FLP 8/11 showed TChol 225, TG 238, HDL 41, LDL 156... INTOL all statins, she'll do the best she can w/ diet. ~  FLP 8/12 in hosp showed TChol 234, TG 276, HDL 52, LDL 127... Counseled on low chol, low fat, wt reducing diet.  DIABETES MELLITUS (ICD-250.00) - started on METFORMIN 500mg Bid 4/10, but decr on her own to 1 daily 1/11... we had stressed the importance of diet- low carb/ low fat and weight reduction, along w/ her pulm rehab exercises... ~  labs 4/10 showed BS= 131, HgA1c= 7.0.Marland KitchenMarland Kitchen Metformin500Bid started. ~  labs 7/10 showed BS= 134, A1c= 6.0 ~  labs 11/10 showed BS= 148, A1c= 6.5 ~  1/11:  she cut the Metformin to 1/d due to nausea. ~  labs 8/11 showed BS= 137, A1c= 5.7.Marland KitchenMarland Kitchen continue same, get wt down. ~  Labs 8/12 in hosp on Metform500/d showed BS= 110-230, A1c=6.5 ~  Labs 10/12 on Metform500/d showed BS= 141, A1c=7.0.Marland KitchenMarland Kitchen Needs better diet, get wt down, or more meds.  NONTOXIC MULTINODULAR GOITER (ICD-241.1) - eval and rx by DrBalan for Endocrinology & DrGerkin for CCS; she is asymptomatic; dominant nodule was needled and benign; surg consult from DrGerkin- elected observation & he checks her yearly... ~  seen 6/10 by DrGerkin- f/u sonar w/o change in any of the nodules... ~  labs 7/10 showed TSH= 0.89 ~  seen 6/11 by DrGerkin- f/u sonar w/o change in nodules. ~  Labs 8/12 showed TSH= 0.047... Not on Thyroid meds, needs f/u DrBalan (she never went). ~  Labs 10/12 showed TSH= 0.97, FreeT3= 3.2 (2.3-4.2), FreeT4= 0.88 (0.60-1.60)  OBESITY (ICD-278.00) - obese w/ abd panniculus & we discussed diet + exercise strategies.[name]. ~  weight up to 205# 11/09- we discussed diet, calorie restriction, exercise, & get the weight down... ~  weight 4/10 = 198# ~  weight 7/10 = 194# ~  weight 11/10 = 196# ~  weight  2/11 = 184# (post op) ~  weight 8/11 = 181# ~  weight 11/11 = 186#... she needs to do better! ~  Weight 8/12 = 186# ~  Weight 10/12 = 198#  GERD (ICD-530.81) - on PRILOSEC 20mg /d... ~  colonoscopy 7/09 by Rodena Medin showed 4 sm polyps= tubular adenoma on bx... f/u planned 3 yrs...  NEPHROLITHIASIS, HX OF (ICD-V13.01)  Hx of BLADDER CANCER (ICD-188.9) - had hematuria in 2001 & referred to DrPeterson... 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 all subseq cystoscopies have been neg- no recurrence. ~  she reports f/u w/ DrPeterson yearly & doing satis by her report.  DEGENERATIVE JOINT DISEASE (ICD-715.90) - s/p rotator cuff repair in 1991... s/p left TKR from DrGioffre in 2002...  OSTEOPENIA (ICD-733.90) - on ACTONEL 150/mo, ca++, MVI, etc... ~  labs 8/11 showed Vit D level = 22... rec> start Vit D supplement extra 02-1998 u daily...  ANXIETY (ICD-300.00) - on XANAX 0.25mg  Prn... she has signif hx of claustrophobia, but denies that she has any anxiety...   Past Surgical History  Procedure Date  . S/p right rotator cuff repair 1991    Dr. Meade Maw  . S/p turbt 10/01    Dr. Vonita Moss  . S/p left tkr 2002    Dr. Darrelyn Hillock  . 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 S/p left upper lobectomy and node  dissection 1/11 1/11    Dr. Edwyna Shell (multicentric bronchoalveolar cell ca)  . S/p d&c for removal of endometrial polyp (benign) 12/10    GYN    Outpatient Encounter Prescriptions as of 12/05/2010  Medication Sig Dispense Refill  . albuterol (PROVENTIL HFA) 108 (90 BASE) MCG/ACT inhaler Inhale 2 puffs into the lungs every 6 (six) hours as needed.        [name] aspirin 81 MG tablet Once a day       . Cholecalciferol (VITAMIN D3) 50000 UNITS CAPS Once capsule a week       . clotrimazole (MYCELEX) 10 MG troche 1 troche 5 times a day x 7 days  35 tablet  0  . Cyanocobalamin (VITAMIN B 12  PO) Take by mouth. 1000 mcg once a day       . DIOVAN HCT 160-12.5 MG per tablet TAKE 1 TABLET BY MOUTH ONCE DAILY  30 tablet  10  . Diphenhyd-Hydrocort-Nystatin SUSP Take 1 tsp swish and swallow four times a day  4 oz  0  . FENOGLIDE 120 MG TABS TAKE 1 TABLET BY MOUTH ONCE DAILY  30 tablet  3  . Fluticasone-Salmeterol (ADVAIR DISKUS) 500-50 MCG/DOSE AEPB Inhale 1 puff into the lungs 2 (two) times daily.  60 each  3  . furosemide (LASIX) 20 MG tablet take 1 tablet by mouth once daily if needed for SWELLING  30 tablet  PRN  . guaiFENesin (MUCINEX) 600 MG 12 hr tablet Take 1,200 mg by mouth 2 (two) times daily.        [name] ibuprofen (ADVIL,MOTRIN) 200 MG tablet Take 400 mg by mouth every 6 (six) hours as needed.        . metFORMIN (GLUCOPHAGE) 500 MG tablet take 1 tablet by mouth every morning  30 tablet  6  . Multiple Vitamin (MULTIVITAMIN PO) Take by mouth. Once a day       . Omega-3 Fatty Acids (FISH OIL) 1200 MG CAPS Take by mouth. Once a day      . omeprazole (PRILOSEC) 20 MG capsule Take 20 mg by mouth daily.        . risedronate (ACTONEL) 150 MG tablet Take 150 mg by mouth every 30 (thirty) days.       [name] SPIRIVA HANDIHALER 18 MCG inhalation capsule INHALE THE CONTENTS OF 1 CAPSULE VIA HANDIHALER ONCE DAILY.  30 capsule  5  . verapamil (CALAN-SR) 240 MG CR tablet TAKE 1 TABLET BY MOUTH ONCE DAILY  30 tablet  10  . diphenhydrAMINE (BENADRYL) 25 MG tablet As needed       . traMADol (ULTRAM) 50 MG tablet Take 50 mg by mouth every 6 (six) hours as needed.        [name] DISCONTD: Mometasone Furo-Formoterol Fum (DULERA) 200-5 MCG/ACT AERO Inhale 2 puffs into the lungs daily.  1 Inhaler  11  . DISCONTD: ondansetron (ZOFRAN) 4 MG tablet Take 1 tablet (4 mg total) by mouth every 8 (eight) hours as needed for nausea.  30 tablet  0  . DISCONTD: predniSONE (DELTASONE) 20 MG tablet Take as directed until gone      . DISCONTD: SUMAtriptan (IMITREX) 50 MG tablet Once a day at the onset of headache       .  DISCONTD: SUMAtriptan (IMITREX) 6 MG/0.5ML SOLN As needed for cluster headaches         Allergies  Pt states INTOL to ALL STATINS...  Allergen Reactions  . Cephalexin     REACTION: nausea and diarrhea  . Codeine     REACTION: nausea  . Epinephrine     REACTION: nervous  . Erythromycin     REACTION: all mycins cause yeast infections  . Morphine     REACTION: nausea  . Sulfamethoxazole W/Trimethoprim     REACTION: nausea and diarrhea    Current Medications, Allergies, Past Medical History, Past Surgical History, Family History, and Social History were reviewed in [employer] record.    Review of Systems         See HPI - all other systems neg except as noted...  The patient complains of hoarseness, dyspnea on exertion, headaches, muscle weakness, and difficulty walking.  The patient denies anorexia, fever, weight loss, weight gain, vision loss, decreased hearing, chest pain, syncope, peripheral edema, prolonged cough, hemoptysis, abdominal pain, melena, hematochezia, severe indigestion/heartburn, hematuria, incontinence, suspicious skin lesions, transient blindness, depression, unusual weight change, abnormal bleeding, enlarged lymph nodes, and angioedema.     Objective:   Physical Exam     WD, WN, 76 y/o  WF in NAD... GENERAL:  Alert & oriented; pleasant & cooperative... HEENT:  [place]/AT, EOM-wnl, PERRLA, EACs-clear, TMs-wnl, NOSE-clear, THROAT-clear & wnl. NECK:  Supple w/ fairROM; no JVD; normal carotid impulses w/o bruits; no thyromegaly or nodules palpated; no lymphadenopathy. CHEST:  Decr BS bilat; scat wheezing/ rhonchi bilat, no rales or consolidation; s/p VATS surg scar on right & mini thoracotomy scar on left... sl tender to palp left chest wall... HEART:  Regular Rhythm; without murmurs/ rubs/ or gallops heard... ABDOMEN:  Obese w/ panniculus, soft & nontender; normal bowel sounds; no organomegaly or masses  detected. EXT:  S/P left TKR; mild arthritic changes; no varicose veins/ +venous insuffic/ tr edema. NEURO:  CN's intact;  no focal neuro deficits... sl tender over right max sinus. DERM:  No lesions noted; no rash etc...  RADIOLOGY DATA:  Reviewed in the EPIC EMR & discussed w/ the patient...  LABORATORY DATA:  Reviewed in the EPIC EMR & discussed w/ the patient...   Assessment & Plan:   COPD/ AB/ restriction from 2 surgeries>  She has severe airflow obstruction on PFT & we discussed the improtance of: 1) regular med regimen w/ Advair, Spiriva, Mucinex, etc;  2) Must lose weight;  3) must increase exercise program (she declines restart of PulmRehab)...  and I would add 4) careful swallowing, avoid asp, etc...  Hx Lung Cancer w/ 2 prev surgeries>  Followed regularly by DrBurney, DrMohammed; last CT Chest 9/12 was stable...  HBP>  Controlled on  Diovan/HCT & Verapamil, continue same...  HYPERLIPID>  She is INTOL to all statins, on diet/ exercise/ Fenofib/ FishOil; recent FLP remains poorly controlled & she will attempt to improve diet & lose weight...  DM>  Control is barely adeq w/ Metformin 500mg /d & A1c= 7.0; we discussed better low carb diet & exercise program...  Multinod Thyroid>  She never did call DrBalan for f/u; repeat labs show that sh is still biochem euthyroid...  Obesity>  Diet + exercise are the keys here...  GERD>  Recent MBS by speech path w/o asp or penetration; she needs to eat more slowly, chew well, swallow carefully; rec to continue Prilosec, elev HOB, & NPO after dinner...  DJD/ Osteopenia>  OK to restart the Actonel weekly...  Anxiety>  Prev on Xanax as needed, & doesn't think she needs a nerve pill; we  discussed trial of KLONOPIN 0.5mg  bid to help her breathing.Marland KitchenMarland Kitchen

## 2010-12-20 ENCOUNTER — Other Ambulatory Visit: Payer: Self-pay | Admitting: Pulmonary Disease

## 2010-12-26 ENCOUNTER — Other Ambulatory Visit: Payer: Self-pay | Admitting: *Deleted

## 2010-12-26 MED ORDER — DIPHENHYD-HYDROCORT-NYSTATIN MT SUSP: OROMUCOSAL | Status: DC

## 2011-02-04 ENCOUNTER — Other Ambulatory Visit: Payer: Self-pay | Admitting: Pulmonary Disease

## 2011-02-28 ENCOUNTER — Other Ambulatory Visit: Payer: Self-pay | Admitting: Pulmonary Disease

## 2011-03-06 ENCOUNTER — Telehealth: Payer: Self-pay | Admitting: Pulmonary Disease

## 2011-03-06 NOTE — Telephone Encounter (Signed)
I spoke with pt and she wanted to know what her dx meant' that was printed off on her AVS when she was here at her last OV. I explained these to the pt and she voiced her understanding and needed nothing further

## 2011-03-12 ENCOUNTER — Telehealth: Payer: Self-pay | Admitting: Adult Health

## 2011-03-12 ENCOUNTER — Other Ambulatory Visit: Payer: Self-pay | Admitting: Adult Health

## 2011-03-12 MED ORDER — TRAMADOL HCL 50 MG PO TABS: 50.0000 mg | ORAL_TABLET | Freq: Three times a day (TID) | ORAL | Status: DC | PRN

## 2011-03-12 MED ORDER — MELOXICAM 7.5 MG PO TABS: 7.5000 mg | ORAL_TABLET | Freq: Every day | ORAL | Status: DC

## 2011-03-12 NOTE — Telephone Encounter (Signed)
Called and spoke with pt and she stated that she does not see Dr. Ethelene Hal very often and is wanting SN to send in refills for the tramadol and something for her arthritis.  Per SN---ok for pt to have tramadol 50mg   #90  1 po tid prn for arthritis pain with 5 refills and meloxicam 7.5mg   #30  1 daily with 5 refills.  Pt is aware that these meds have been sent to her pharmacy and she will keep her appt in march with SN.

## 2011-03-12 NOTE — Telephone Encounter (Signed)
I spoke with pt and she states she has been getting her tramadol refilled. She states she has been getting this refilled by Dr. Ethelene Hal. She states she has stated taking this for her arthritis and she states she can take this up to 4 times (but states she does not). Pt is requesting it to be enough for a month supply. Also pt is requesting something else for her arthritis being called in bc she states the tramadol doesn't;t really help. Normally she takes the tramadol for her cluster headaches. Please advise Dr. Kriste Basque, thanks  Allergies  Allergen Reactions  . Cephalexin     REACTION: nausea and diarrhea  . Codeine     REACTION: nausea  . Epinephrine     REACTION: nervous  . Erythromycin     REACTION: all mycins cause yeast infections  . Morphine     REACTION: nausea  . Sulfamethoxazole W/Trimethoprim     REACTION: nausea and diarrhea      [facility] [address]

## 2011-03-26 ENCOUNTER — Telehealth: Payer: Self-pay | Admitting: Pulmonary Disease

## 2011-03-26 NOTE — Telephone Encounter (Signed)
Per SN he'd prefer to wait 30 days for depo/prednisone after her knee injection. I spoke with pt and made her aware of this and needed nothing further

## 2011-03-26 NOTE — Telephone Encounter (Signed)
Called, spoke with pt.  States she needs to get an injection in her knee by Dr. Ethelene Hal at [facility] but would like to know how long after this injection she will need to wait to come for OV to get a depo injection and prednisone taper for her "same ol breathing problem."  Pt states this is no worse and can wait until after knee injection to come in for the depo and prednisone -- just would like to know how to schedule the 2 appts.  Dr. Kriste Basque, pls advise.  Thanks!

## 2011-04-01 ENCOUNTER — Telehealth: Payer: Self-pay | Admitting: Pulmonary Disease

## 2011-04-01 MED ORDER — CIPROFLOXACIN HCL 500 MG PO TABS: 500.0000 mg | ORAL_TABLET | Freq: Two times a day (BID) | ORAL | Status: AC

## 2011-04-01 NOTE — Telephone Encounter (Signed)
LMOMTCB x 1 

## 2011-04-01 NOTE — Telephone Encounter (Signed)
Pt returned triage's call.  [name] ° °

## 2011-04-01 NOTE — Telephone Encounter (Signed)
Per SN: can call in cipro 500 mg 1 BID #20 and take align QD while on ABX. She will need to keep her apt with Dr. Thomasena Edis.  I called and made pt aware of this and she voiced her understanding. Rx's have been sent to the pharmacy

## 2011-04-01 NOTE — Telephone Encounter (Signed)
Pt c/o sinus drainage (green), head congestion, prod cough (green), SOB and wheezing is same as usual.  Pt denies fever. Symptoms for past 2-3 days. Pt also c/o meds called in for arthritis are not helping (Tramadol and Meloxicam)..  Pt has appt next week with Dr Thomasena Edis for injection for knee pain.  Instructed pt to keep this appt.  She still wants to know if Dr Kriste Basque will prescribe anything different for arthritis.  Please advise. Allergies  Allergen Reactions  . Cephalexin     REACTION: nausea and diarrhea  . Codeine     REACTION: nausea  . Epinephrine     REACTION: nervous  . Erythromycin     REACTION: all mycins cause yeast infections  . Morphine     REACTION: nausea  . Sulfamethoxazole W/Trimethoprim     REACTION: nausea and diarrhea

## 2011-04-02 ENCOUNTER — Ambulatory Visit: Payer: Medicare Other | Admitting: Pulmonary Disease

## 2011-04-12 ENCOUNTER — Other Ambulatory Visit: Payer: Self-pay | Admitting: Pulmonary Disease

## 2011-04-16 ENCOUNTER — Other Ambulatory Visit: Payer: Self-pay | Admitting: Medical Oncology

## 2011-04-16 ENCOUNTER — Telehealth: Payer: Self-pay | Admitting: Medical Oncology

## 2011-04-16 DIAGNOSIS — C3490 Malignant neoplasm of unspecified part of unspecified bronchus or lung: Secondary | ICD-10-CM

## 2011-04-16 NOTE — Telephone Encounter (Signed)
Wants to know when next labs,  and CT scan and f/u . Pt is having BMET done on 04/26/11 at Dr Kirstie Mirza.  She would like her Ct done on 3/26 before her appointment with Dd nadel at 1200. Schedule request sent and told her someone will call her with CT and MD appointments

## 2011-04-18 ENCOUNTER — Ambulatory Visit: Payer: Medicare Other | Admitting: Pulmonary Disease

## 2011-04-18 ENCOUNTER — Other Ambulatory Visit: Payer: Self-pay | Admitting: *Deleted

## 2011-04-18 DIAGNOSIS — C3490 Malignant neoplasm of unspecified part of unspecified bronchus or lung: Secondary | ICD-10-CM

## 2011-04-23 ENCOUNTER — Telehealth: Payer: Self-pay | Admitting: Pulmonary Disease

## 2011-04-23 ENCOUNTER — Telehealth: Payer: Self-pay | Admitting: Internal Medicine

## 2011-04-23 DIAGNOSIS — E119 Type 2 diabetes mellitus without complications: Secondary | ICD-10-CM

## 2011-04-23 DIAGNOSIS — E785 Hyperlipidemia, unspecified: Secondary | ICD-10-CM

## 2011-04-23 DIAGNOSIS — I10 Essential (primary) hypertension: Secondary | ICD-10-CM

## 2011-04-23 NOTE — Telephone Encounter (Signed)
pt called to ck on status of appts,ct made same day as nadel per pt req,mkm f/u made and pt states we can use same blood work that she will be having for dr Kriste Basque for her ct

## 2011-04-23 NOTE — Telephone Encounter (Signed)
Please advise what labs pt will need prior to her OV Dr. Kriste Basque, thanks

## 2011-04-23 NOTE — Telephone Encounter (Signed)
Called and spoke with pt and she is aware of labs in the computer prior to her ov with SN next week.

## 2011-04-24 ENCOUNTER — Other Ambulatory Visit (INDEPENDENT_AMBULATORY_CARE_PROVIDER_SITE_OTHER): Payer: Medicare Other

## 2011-04-24 DIAGNOSIS — E119 Type 2 diabetes mellitus without complications: Secondary | ICD-10-CM

## 2011-04-24 DIAGNOSIS — E785 Hyperlipidemia, unspecified: Secondary | ICD-10-CM

## 2011-04-24 DIAGNOSIS — I10 Essential (primary) hypertension: Secondary | ICD-10-CM

## 2011-04-24 LAB — BASIC METABOLIC PANEL
BUN: 27 mg/dL — ABNORMAL HIGH (ref 6–23)
Calcium: 10.3 mg/dL (ref 8.4–10.5)
GFR: 88.28 mL/min (ref >60.00)
Glucose, Bld: 124 mg/dL — ABNORMAL HIGH (ref 70–99)
Sodium: 141 mEq/L (ref 135–145)

## 2011-04-24 LAB — LIPID PANEL
HDL: 55.7 mg/dL (ref >39.00)
Triglycerides: 85 mg/dL (ref 0.0–149.0)
VLDL: 17 mg/dL (ref 0.0–40.0)

## 2011-04-24 LAB — HEMOGLOBIN A1C: Hgb A1c MFr Bld: 6.7 % — ABNORMAL HIGH (ref 4.6–6.5)

## 2011-04-30 ENCOUNTER — Encounter: Payer: Self-pay | Admitting: Pulmonary Disease

## 2011-04-30 ENCOUNTER — Ambulatory Visit (HOSPITAL_COMMUNITY)
Admission: RE | Admit: 2011-04-30 | Discharge: 2011-04-30 | Disposition: A | Discharge status: Home / Self Care | Payer: Medicare Other | Source: Ambulatory Visit | Attending: Internal Medicine | Admitting: Internal Medicine

## 2011-04-30 ENCOUNTER — Ambulatory Visit (INDEPENDENT_AMBULATORY_CARE_PROVIDER_SITE_OTHER): Payer: Medicare Other | Admitting: Pulmonary Disease

## 2011-04-30 VITALS — BP 122/72 | HR 73 | Temp 96.8°F | Ht 62.0 in | Wt 188.6 lb

## 2011-04-30 DIAGNOSIS — J45909 Unspecified asthma, uncomplicated: Secondary | ICD-10-CM

## 2011-04-30 DIAGNOSIS — I872 Venous insufficiency (chronic) (peripheral): Secondary | ICD-10-CM

## 2011-04-30 DIAGNOSIS — C3490 Malignant neoplasm of unspecified part of unspecified bronchus or lung: Secondary | ICD-10-CM

## 2011-04-30 DIAGNOSIS — C679 Malignant neoplasm of bladder, unspecified: Secondary | ICD-10-CM | POA: Insufficient documentation

## 2011-04-30 DIAGNOSIS — E042 Nontoxic multinodular goiter: Secondary | ICD-10-CM

## 2011-04-30 DIAGNOSIS — R0602 Shortness of breath: Secondary | ICD-10-CM | POA: Insufficient documentation

## 2011-04-30 DIAGNOSIS — F411 Generalized anxiety disorder: Secondary | ICD-10-CM

## 2011-04-30 DIAGNOSIS — Z85118 Personal history of other malignant neoplasm of bronchus and lung: Secondary | ICD-10-CM | POA: Insufficient documentation

## 2011-04-30 DIAGNOSIS — E785 Hyperlipidemia, unspecified: Secondary | ICD-10-CM

## 2011-04-30 DIAGNOSIS — K219 Gastro-esophageal reflux disease without esophagitis: Secondary | ICD-10-CM

## 2011-04-30 DIAGNOSIS — I2510 Atherosclerotic heart disease of native coronary artery without angina pectoris: Secondary | ICD-10-CM | POA: Insufficient documentation

## 2011-04-30 DIAGNOSIS — E669 Obesity, unspecified: Secondary | ICD-10-CM

## 2011-04-30 DIAGNOSIS — J984 Other disorders of lung: Secondary | ICD-10-CM | POA: Insufficient documentation

## 2011-04-30 DIAGNOSIS — R059 Cough, unspecified: Secondary | ICD-10-CM | POA: Insufficient documentation

## 2011-04-30 DIAGNOSIS — J438 Other emphysema: Secondary | ICD-10-CM | POA: Insufficient documentation

## 2011-04-30 DIAGNOSIS — I10 Essential (primary) hypertension: Secondary | ICD-10-CM

## 2011-04-30 DIAGNOSIS — E119 Type 2 diabetes mellitus without complications: Secondary | ICD-10-CM

## 2011-04-30 DIAGNOSIS — M899 Disorder of bone, unspecified: Secondary | ICD-10-CM

## 2011-04-30 DIAGNOSIS — R05 Cough: Secondary | ICD-10-CM | POA: Insufficient documentation

## 2011-04-30 DIAGNOSIS — M1990 Unspecified osteoarthritis, unspecified site: Secondary | ICD-10-CM

## 2011-04-30 MED ORDER — IOHEXOL 300 MG/ML  SOLN
80.0000 mL | Freq: Once | INTRAMUSCULAR | Status: AC | PRN
  Administered 2011-04-30: 80 mL via INTRAVENOUS

## 2011-04-30 MED ORDER — METHYLPREDNISOLONE ACETATE 80 MG/ML IJ SUSP
80.0000 mg | Freq: Once | INTRAMUSCULAR | Status: AC
  Administered 2011-04-30: 80 mg via INTRAMUSCULAR

## 2011-04-30 MED ORDER — METHYLPREDNISOLONE (PAK) 4 MG PO TABS: ORAL_TABLET | ORAL | Status: AC

## 2011-04-30 NOTE — Progress Notes (Addendum)
Subjective:    Patient ID: [name], female    DOB: 1934-10-30, 77 y.o.   MRN: [id number]  HPI 77 y/o WF here for a follow up visit... she has mult medical specialists who follow all of her medical problems (see below)...  ~  September 25, 2009:  20mo ROV- c/o cough "at least 2 times per day" & tongue sore... she has had follow ups w/ her specialists:  DrRamos 5/11 for LBP to right leg & given epid steroid shot (improved)...  DrBurney 6/11 CXR stable & CT planned in Aug, she had some dypnea which he related to the amt of lung tissue resected...  DrGerkin 6/11 for bilat thyroid nodules w/ benign bx- f/u sonar w/ multinod goiter, no ch in nodules, TSH= WNL.[name].  she has f/u appt w/ DrMohammed in several weeks...  ~  December 25, 2009:  Add-on appt for "sinus"- c/o right eye problem "it's allergy" & notes red, swollen, right sided facial pain & HA;  notes drainage "it pours" mucus, congestion; hurts in her teeth down to her jaw, but denies fever, discolored phlegm or blood... 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  we discussed checking sinus XRays & treating her w/ Depo/ Pred, Augmentin, Mucinex >> if symptoms persist she will need Neuro f/u for ?atypic facial pain?  ~  April 12, 2010:  She notes mult somatic concerns> SOB- "it's my weight", Cluster HAs- improved off wine "hopefully they are gone for the next 16yr cycle", right great toenail infection surg by podiatry & finally improved but reactions to Kelflex & Septra w/ nausea & diarrhea...     She had CT 3/12 per DrMohamed> COPD & post surg changes, <1cm ground glass nodule LUL w/o change, stable/ NAD, hep steatosis;  they plan continued watchful waiting...    BP controlled on meds> 140/76 today & similar at home;  needs better diet & wt reduction to keep from having to incr her BP meds...    Chol looks fair on Fenoglide + FishOil  as she is intol to all statins> TChol 215, TG 181, HDL 40, LDL 157...    DM control is adeq on the Metformin alone>  Bs=140, A1c=6.8, but wt is up 11# to 197# & we reviewed low carb, low chol/fat diets...    Thyroid> TSH=1.08 (not on meds) & she reports being released by DrGerkins, no change x39yrs...    LABS 04/09/10 also shows Vit D still low at 24 ?on 2000u daily supplement> she prefers 50K weekly & we wrote Rx... we discussed Shingles vaccine for her at her convenience...  ~  October 04, 2010:  61mo ROV & post hosp visit>  She has had mult bronchitic exac over the interval, seen by TP w/ antibiotic & Pred rx; she has maintained a hectic personal life & travel schedule (she likes DepoMedrol shots prior to her trips;  Recent refractory AB episode w/o the usual improvement on Rx & reported choking on her dinner w/ incr dyspnea & hypoxemia requiring Hosp ==> responded slowly to Rx until she coughed up a chunk of onion w/ more rapid improvement after that;  disch on Pred taper, Dulera, Spiriva, Mucinex, etc; she was seen by speech path but no asp seen w/ any consistancy tested & rec for sm bites, chew carefully, swallow deliberately & slowly...     Since disch she's  been stable (see meds below) & PFT today w/ severe airflow obstruction & FEV1=1.08 (56%), %1sec=48...  ~  December 05, 2010:  59mo ROV & she notes incr SOB w/ activ, & states she never got back to her prev baseline after last hosp & upset because she missed 2 trips; she notes that she hurt her back & hasn't been exercising because of this- since then incr SOB w/ some ADLs and incr edema in her legs;  She notes that she "can't get a deep breath" & she appears more anxious than usual w/ mult somatic complaints- indigestion, cramps in hands & feet, etc...  We discussed taking the Alprazolam 0.25mg  Tid regularly for her chest wall musc spasm so she can get a good deep breath & see how this works (she is asking about Accolate because a friend is on it & it  really helped them)...    COPD w/ revers component> on Advair500 but only using it once/d (she stopped [facility] stating it made her mouth sore), Spiriva, Mucinex, & Proventil HFA; asked (again) to maximize her regimen w/ AdvairBid, Mucinex2Bid, Fluids, etc; last PFT w/ FEV1=1.08 stable; see above...    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 NOTE: element of lung restriction from surg & obesity...    HBP> on DiovanHCT, Verapamil, & Lasix prn; BP= 144/80 & similar at home; she has mult somatic complaints...    Ven Insuffic> she knows to elim sodium but she eats out often, elev legs, wear support hose; she has been taking the Lasix20mg  daily due to the swelling & improved...     Hyperlipid> on Fenoglide120 & FishOil; she states INTOL to all statins; last FLP 8/12 in hosp as below; asked to ret for fasting blood work...    DM> on Metform500/d; BS=141, A1c=7.0; she will need more meds if she can't get on diet 7 get weight down...    Obesity> wt=198# which is up 12# in the last 59mo!!! We reviewed low carb, low fat, wt reducing diet 7 exercise prescription...    Multinod Goiter> prev eval from [place] DrGerkin; TFTs showed TSH=0.97, and FreeT3/ FreeT4 are wnl...    GI> GERD, Polyps> on Prilosec & had episode of choking on dinner w/ resp exac- see speech path eval; followed by DrGessner for GI w/ 54yr f/u colonoscopy due soon...    GU> Bladder Cancer> Papilllary TCCa resected 2001 & no recurrence; followed by DrPeterson w/ yearly cystos neg...    DJD/ LBP> on musc relaxer & Advil; prev rotator cuff surg 1991, left TKR 2002, now followed by DrRamos for LBP & plans shot in back (she reports it's improved spontaneously)...    Osteopenia> on Actonel, calcium, MVI, Vit D; ?when her last BMD was done?     Anxiety> as noted- she has Alpraz for prn use but doesn't take it often...  ~  April 30, 2011:  34mo ROV & [name] has been stable w/ her severe lung disease as noted; unfortunately she still self-adjusts her meds despite my admonition to take meds regularly as prescribed for max benefit; she tells me she has "6 trips" coming up & she wants her "magic shot" meaning DepoMedrol; we have on numerous occas discussed the importance of minimizing the cortisone Rx & maximizing her bronchdilators etc; plus we have implored her to decrease her  aggressive travel schedule fearing that she will have a resp exacerbation (w/ acute resp failure) [place]... See prob list below>> LABS 3/13:  FLP- not at goal on Feno120;  Chems- ok x BS=124 A1c=6.7 on Metform monotherapy...   Problem List:    << PROBLEM LIST UPDATED 04/30/11 >>  ASTHMA (ICD-493.90) / COPD (ICD-496) - ex-smoker, quit 1997... Prev on Advair500 now DULERA200 2spBid (ch due to sore throat) & SPIRIVA 1/d + MUCINEX 2Bid & PROVENTIL Prn... she was participating in [facility] (last 3/09) & stopped on her own... may have a superimposed component of restriction due to obesity & prev lung surgeries... ~  11/05: s/p RULobectomy for lung ca- (adeno w/ bronchoalveolar cell features). ~  PFT 8/08 showed FVC=2.02 (77%), FEV1=1.07 (51%), %1sec=53, mid-flows=19%pred. ~  PFT 7/09 today= FVC=1.93 (72%), FEV1=1.04 (49%), %1sec=54, mid-flows=19%pred. ~  1/11:  s/p LUL resection by DrBurney> multicentric bronchoalveolar cell cancer. ~  PFT 8/12 showed FVC=2.25 (88%), FEV1=1.08 (56%), %1sec=48, mid-flows=30% pred. ~  10/12:  She reports Dulera caused sore mouth so she switched back to Advair500 but asked to do this Bid regularly.  Hx of LUNG CANCER (ICD-162.9) - Hx multicentric bronchoalveolar cell lung cancer >> followed by DrBurney for CVTS & DrMohammed for Oncology... ~  s/p right upper lobectomy by DrBurney 11/05 for a stage 1A non-small cell  lung cancer (adenocarcinoma w/ bronchalveolar cell features); post-op observation only... ~  CT Chest 11/08 showed no recurrence, mult bilat nodules unchanged x3+ yrs, nodular thyroid w/o change... ~  CXR 7/09 showed stable post-op changes and scarring on the right, NAD.[name]. ~  CTAngio Chest 10/09 showed neg for PE, prom thyroid, atherosclerotic changes in Ao, no change in ground-glass nodules in LUL area... ~  CT Chest 11/10 by DrMohammed showed new LUL solid nodule, no change in ground-glass areas... lesion was PET pos... ~  1/11:  s/p LULobectomy & node dissection via minithoracotomy by DrBurney- path showed 3 foci of well diff bronchoalveolar cell ca & neg nodes... decision made at conference for no chemoRx, EGFR assay was neg... ~  she continues to have monitoring by DrBurney/ DrMohammed> on observation alone; last note from DrMohammed 9/12 indicated she was stable & he plans f/u CT Chest & OV in 57mo... ~  Last CT Chest 9/12 showed stable 9mm ground glass opac LUL w/o change from 3/12 scan; +post op changes, COPD, coronary calcif, & hep steatosis...  HYPERTENSION (ICD-401.9) - on DIOVAN/Hct 160-12.5 Daily, VERAPAMIL SR 240mg /d, & LASIX 20mg  "Prn"... BP= 122/72 & feeling OK, tolerating Rx etc... denies CP, palipit, dizziness, syncope, ch in dyspnea, edema, etc...  ~  Cardiac eval 9/09 by Walker Kehr was neg and 2DEcho showed DD w/ norm LVsys function, EF= 60-65%, no regional wall motion abn...  PAROXYSMAL ATRIAL FIBRILLATION (ICD-427.31) - hx PAF in the post op period... converted to NSR & holding... transiently on Amiodarone & she wanted off Rx...  PERIPHERAL VASCULAR DISEASE (ICD-443.9) - she has atherosclerotic changes in her Ao noted on her prev scans...  ~  11/10: pt had mult questions about this problem on the prob list- discussed "hardening of the arteries" in detail.  VENOUS INSUFFICIENCY (ICD-459.81) - she knows to follow a low sodium diet, elevate legs, wear support hose, etc; she insists  on keeping Lasix 20mg  on hand for Prn use...  HYPERLIPIDEMIA (ICD-272.4) - prev on Livolo 2mg - 1/2 tab daily (stopped due to aching), +FENOGLIDE 120mg /d, FISH OIL & CoQ10  supplements... prev on Vytorin  but INTOL ==> she states INTOL to all statins... she was not satis w/ the Lipid Clinic in the past. ~  labs 8/08 off Vytorin showed TChol 183, TG 207, HDL 46, LDL 112... try fenofibrate... ~  FLP 5/09 on Feno120 showed TChol 188, TG 111, HDL 28, LDL 137... cont same, better diet, get wt down! ~  FLP 4/10 on Feno120 showed TChol 240, TG 104, HDL 49, LDL 165... I rec f/u Lipid Clinic, she declined. ~  FLP 7/10 on Feno120 showed TChol 222, TG 152, HDL 36, LDL 172... rec> try LIVALO 2mg - 1/2 tab Qhs, she stopped. ~  FLP 11/10 on Feno120+FishOil showed TChol 253, TG 125, HDL 42, LDL 208... try LIVALO2mg - 1/2 tab/d & stay on it. ~  FLP 1/11 on Liv1mg +Feno120 showed TChol 170, TG 101, HDL 45, LDL 105... continue same. ~  3/11: she reports aching all over & DrBurney stopped the Livolo> contin diet + other meds. ~  FLP 8/11 showed TChol 225, TG 238, HDL 41, LDL 156... INTOL all statins, she'll do the best she can w/ diet. ~  FLP 8/12 in hosp showed TChol 234, TG 276, HDL 52, LDL 127... Counseled on low chol, low fat, wt reducing diet. ~  FLP 3/13 on Feno120 showed TChol 221, TG 85, HDL 56, LDL 159... She refuses Statin Rx or Lipid Clinic referral...  DIABETES MELLITUS (ICD-250.00) - started on METFORMIN 500mg Bid 4/10, but decr on her own to 1 daily 1/11... we had stressed the importance of diet- low carb/ low fat and weight reduction, along w/ her pulm rehab exercises... ~  labs 4/10 showed BS= 131, HgA1c= 7.0.Marland KitchenMarland Kitchen Metformin500Bid started. ~  labs 7/10 showed BS= 134, A1c= 6.0 ~  labs 11/10 showed BS= 148, A1c= 6.5 ~  1/11:  she cut the Metformin to 1/d due to nausea. ~  labs 8/11 showed BS= 137, A1c= 5.7.Marland KitchenMarland Kitchen continue same, get wt down. ~  Labs 8/12 in hosp on Metform500/d showed BS= 110-230, A1c=6.5 ~  Labs  10/12 on Metform500/d showed BS= 141, A1c=7.0.Marland KitchenMarland Kitchen Needs better diet, get wt down, or more meds. ~  1/13:  Ophthalmology check up by DrMcCuen> neg- no diabetic retinopathy... ~  Labs 3/13 on Metform500/d showed BS= 124, A1c= 6.7  NONTOXIC MULTINODULAR GOITER (ICD-241.1) - eval and rx by DrBalan for Endocrinology & DrGerkin for CCS; she is asymptomatic; dominant nodule was needled and benign; surg consult from DrGerkin- elected observation & he checks her yearly... ~  seen 6/10 by DrGerkin- f/u sonar w/o change in any of the nodules... ~  labs 7/10 showed TSH= 0.89 ~  seen 6/11 by DrGerkin- f/u sonar w/o change in nodules. ~  Labs 8/12 showed TSH= 0.047... Not on Thyroid meds, needs f/u DrBalan (she never went). ~  Labs 10/12 showed TSH= 0.97, FreeT3= 3.2 (2.3-4.2), FreeT4= 0.88 (0.60-1.60)  OBESITY (ICD-278.00) - obese w/ abd panniculus & we discussed diet + exercise strategies.[name]. ~  weight up to 205# 11/09- we discussed diet, calorie restriction, exercise, & get the weight down... ~  weight 4/10 = 198# ~  weight 7/10 = 194# ~  weight 11/10 = 196# ~  weight 2/11 = 184# (post op) ~  weight 8/11 = 181# ~  weight 11/11 = 186#... she needs to do better! ~  Weight 8/12 = 186# ~  Weight 10/12 = 198#... What happened? ~  Weight 3/13 = 189#... Keep up the good work!  GERD (ICD-530.81) - on PRILOSEC 20mg /d... ~  colonoscopy 7/09 by Rodena Medin  showed 4 sm polyps= tubular adenoma on bx... f/u planned 3 yrs...  NEPHROLITHIASIS, HX OF (ICD-V13.01)  Hx of BLADDER CANCER (ICD-188.9) - had hematuria in 2001 & referred to DrPeterson... 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 all subseq cystoscopies have been neg- no recurrence. ~  she reports f/u w/ DrPeterson yearly & doing satis by her report.  DEGENERATIVE JOINT DISEASE (ICD-715.90) - s/p rotator cuff repair in 1991... s/p left TKR from DrGioffre in 2002...  OSTEOPENIA (ICD-733.90) - on  ACTONEL 150/mo, ca++, MVI, etc... ~  labs 8/11 showed Vit D level = 22... rec> start Vit D supplement extra 02-1998 u daily...  ANXIETY (ICD-300.00) - on XANAX 0.25mg  Prn... she has signif hx of claustrophobia, but denies that she has any anxiety...   Past Surgical History  Procedure Date  . S/p right rotator cuff repair 1991    Dr. Meade Maw  . S/p turbt 10/01    Dr. Vonita Moss  . S/p left tkr 2002    Dr. Darrelyn Hillock  . 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 S/p d&c for removal of endometrial polyp (benign) 12/10    GYN    Outpatient Encounter Prescriptions as of 04/30/2011  Medication Sig Dispense Refill  . ACTONEL 150 MG tablet TAKE 1 TABLET BY MOUTH EVERY MONTH.  1 tablet  3  . albuterol (PROVENTIL HFA) 108 (90 BASE) MCG/ACT inhaler Inhale 2 puffs into the lungs every 6 (six) hours as needed.        [name] aspirin 81 MG tablet Once a day       . Biotin 5000 MCG TABS Take 1 tablet by mouth daily.      . Coenzyme Q10 (COQ10) 200 MG CAPS Take 1 capsule by mouth daily.      . Cyanocobalamin (VITAMIN B 12 PO) Take by mouth. 1000 mcg once a day       . DIOVAN HCT 160-12.5 MG per tablet TAKE 1 TABLET BY MOUTH ONCE DAILY  30 tablet  10  . Diphenhyd-Hydrocort-Nystatin SUSP Take 1 tsp swish and swallow four times a day  6 oz  5  . diphenhydrAMINE (BENADRYL) 25 MG tablet As needed       . FENOGLIDE 120 MG TABS TAKE 1 TABLET BY MOUTH ONCE DAILY  30 tablet  0  . Fluticasone-Salmeterol (ADVAIR DISKUS) 500-50 MCG/DOSE AEPB Inhale 1 puff into the lungs 2 (two) times daily.  60 each  3  . furosemide (LASIX) 20 MG tablet take 1 tablet by mouth once daily if needed for SWELLING  30 tablet  PRN  . Glucosamine-Chondroit-Vit C-Mn (GLUCOSAMINE CHONDR 1500 COMPLX) CAPS Take 1 capsule by mouth daily.      [name] guaiFENesin (MUCINEX) 600 MG 12 hr tablet Take  1,200 mg by mouth 2 (two) times daily.        . meloxicam (MOBIC) 7.5 MG tablet Take 1 tablet (7.5 mg total) by mouth daily.  30 tablet  5  . metFORMIN (GLUCOPHAGE) 500 MG tablet take 1 tablet by mouth every morning  30 tablet  6  . Multiple Vitamin (MULTIVITAMIN PO) Take by mouth. Once a day       . Omega-3 Fatty Acids (FISH OIL) 1200 MG CAPS Take by mouth. Once a day      .  SPIRIVA HANDIHALER 18 MCG inhalation capsule INHALE THE CONTENTS OF 1 CAPSULE VIA HANDIHALER ONCE DAILY.  30 capsule  5  . traMADol (ULTRAM) 50 MG tablet Take 1 tablet (50 mg total) by mouth 3 (three) times daily as needed.  90 tablet  5  . verapamil (CALAN-SR) 240 MG CR tablet TAKE 1 TABLET BY MOUTH ONCE DAILY  30 tablet  10  . Vitamin D, Ergocalciferol, (DRISDOL) 50000 UNITS CAPS take 1 capsule by mouth every week  4 capsule  12  . DISCONTD: Cholecalciferol (VITAMIN D3) 50000 UNITS CAPS Once capsule a week       . DISCONTD: clotrimazole (MYCELEX) 10 MG troche 1 troche 5 times a day x 7 days  35 tablet  0  . DISCONTD: ibuprofen (ADVIL,MOTRIN) 200 MG tablet Take 400 mg by mouth every 6 (six) hours as needed.        [name] DISCONTD: omeprazole (PRILOSEC) 20 MG capsule Take 20 mg by mouth daily.        [name] DISCONTD: risedronate (ACTONEL) 150 MG tablet Take 150 mg by mouth every 30 (thirty) days.        Facility-Administered Encounter Medications as of 04/30/2011  Medication Dose Route Frequency Provider Last Rate Last Dose  . iohexol (OMNIPAQUE) 300 MG/ML solution 80 mL  80 mL Intravenous Once PRN Medication Radiologist, MD   80 mL at 04/30/11 1051    Allergies                                          Pt states INTOL to ALL STATINS...  Allergen Reactions  . Cephalexin     REACTION: nausea and diarrhea  . Codeine     REACTION: nausea  . Epinephrine     REACTION: nervous  . Erythromycin     REACTION: all mycins cause yeast infections  . Morphine     REACTION: nausea  . Sulfamethoxazole W/Trimethoprim     REACTION: nausea  and diarrhea    Current Medications, Allergies, Past Medical History, Past Surgical History, Family History, and Social History were reviewed in [employer] record.    Review of Systems         See HPI - all other systems neg except as noted...  The patient complains of hoarseness, dyspnea on exertion, headaches, muscle weakness, and difficulty walking.  The patient denies anorexia, fever, weight loss, weight gain, vision loss, decreased hearing, chest pain, syncope, peripheral edema, prolonged cough, hemoptysis, abdominal pain, melena, hematochezia, severe indigestion/heartburn, hematuria, incontinence, suspicious skin lesions, transient blindness, depression, unusual weight change, abnormal bleeding, enlarged lymph nodes, and angioedema.     Objective:   Physical Exam     WD, WN, 77 y/o  WF in NAD... GENERAL:  Alert & oriented; pleasant & cooperative... HEENT:  [place]/AT, EOM-wnl, PERRLA, EACs-clear, TMs-wnl, NOSE-clear, THROAT-clear & wnl. NECK:  Supple w/ fairROM; no JVD; normal carotid impulses w/o bruits; no thyromegaly or nodules palpated; no lymphadenopathy. CHEST:  Decr BS bilat; scat wheezing/ rhonchi bilat, no rales or consolidation; s/p VATS surg scar on right & mini thoracotomy scar on left... sl tender to palp left chest wall... HEART:  Regular Rhythm; without murmurs/ rubs/ or gallops heard... ABDOMEN:  Obese w/ panniculus, soft & nontender; normal bowel sounds; no organomegaly or masses detected. EXT:  S/P left TKR; mild arthritic changes; no varicose veins/ +venous insuffic/ tr edema. NEURO:  CN's intact;  no focal neuro deficits... sl tender over right max sinus. DERM:  No lesions noted; no rash etc...  RADIOLOGY DATA:  Reviewed in the EPIC EMR & discussed w/ the patient...  LABORATORY DATA:  Reviewed in the EPIC EMR & discussed w/ the patient...   Assessment & Plan:   COPD/ AB/ restriction from 2 surgeries>  She has severe airflow obstruction on  PFT & we discussed the improtance of: 1) regular med regimen w/ Advair, Spiriva, Mucinex, etc;  2) Must lose weight;  3) must increase exercise program (she declines restart of PulmRehab)...  and I would add 4) careful swallowing, avoid asp, etc...  Hx Lung Cancer w/ 2 prev surgeries>  Followed regularly by DrBurney, DrMohammed; last CT Chest 9/12 was stable...  HBP>  Controlled on  Diovan/HCT & Verapamil, continue same...  HYPERLIPID>  She is INTOL to all statins, on diet/ exercise/ Fenofib/ FishOil; recent FLP remains poorly controlled & she will attempt to improve diet & lose weight...  DM>  Control is barely adeq w/ Metformin 500mg /d & A1c= 7.0; we discussed better low carb diet & exercise program...  Multinod Thyroid>  She never did call DrBalan for f/u; repeat labs show that sh is still biochem euthyroid...  Obesity>  Diet + exercise are the keys here...  GERD>  Recent MBS by speech path w/o asp or penetration; she needs to eat more slowly, chew well, swallow carefully; rec to continue Prilosec, elev HOB, & NPO after dinner...  DJD/ Osteopenia>  OK to restart the Actonel weekly...  Anxiety>  Prev on Xanax as needed, & doesn't think she needs a nerve pill; we discussed trial of KLONOPIN 0.5mg  bid to help her breathing.Marland KitchenMarland Kitchen

## 2011-04-30 NOTE — Patient Instructions (Signed)
Today we updated your med list in our EPIC system...    Continue your current medications the same...  We gave you a Depo shot & Medrol dosepak per your request...  We also gave you samples of the inhalers you requested...  Call for any questions...  Let's plan a routine follow up in 6 months, sooner if needed for problems.Marland KitchenMarland Kitchen

## 2011-05-02 ENCOUNTER — Ambulatory Visit (HOSPITAL_BASED_OUTPATIENT_CLINIC_OR_DEPARTMENT_OTHER): Payer: Medicare Other | Admitting: Internal Medicine

## 2011-05-02 ENCOUNTER — Telehealth: Payer: Self-pay | Admitting: Internal Medicine

## 2011-05-02 VITALS — BP 171/83 | HR 85 | Temp 97.1°F | Ht 61.0 in | Wt 189.3 lb

## 2011-05-02 DIAGNOSIS — Z85118 Personal history of other malignant neoplasm of bronchus and lung: Secondary | ICD-10-CM

## 2011-05-02 DIAGNOSIS — C3490 Malignant neoplasm of unspecified part of unspecified bronchus or lung: Secondary | ICD-10-CM

## 2011-05-02 NOTE — Telephone Encounter (Signed)
gv pt appt schedule for sept including ct 9/26. Per pt do not schedule sept lb due to she will have lb drawn either 9/24 or 9/25 @ dr Jodelle Green office. MM made aware and also central scheduling aware that lbs for sept will be drawn by [facility] primary either 9/24 or 9/25 - pt has ct 9/26.

## 2011-05-02 NOTE — Progress Notes (Signed)
[facility] [phone]   [phone]  OFFICE PROGRESS NOTE  Michele Mcalpine, MD, MD [facility] [address] [place] [address]  PRINCIPAL DIAGNOSES:   1. 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 2. History of stage IA (T1, N0, MX) non-small cell lung cancer, adenocarcinoma with bronchoalveolar features diagnosed in November 2005 involving the right upper lobe.  PRIOR THERAPY:   1. Status post right upper lobectomy on January 03, 2004. 2. Status post wedge resection of the left upper lobe on February 06, 2009 under the care of Dr. Edwyna Shell.  CURRENT THERAPY:  Observation.  INTERVAL HISTORY: [name] 77 y.o. female returns to the clinic today for routine six-month followup visit. The patient denied having any specific complaints today except for wheezes and occasional shortness of breath. She was seen recently by Dr. Kriste Basque and was started on Medrol Dosepak. She felt much better after the treatment. She denied having any significant chest pain, no cough or hemoptysis. No significant weight loss or night sweats. She has repeat CT scan of the chest performed recently and she is here today for evaluation and discussion of her scan results.  MEDICAL HISTORY: Past Medical History  Diagnosis Date  . Asthma   . COPD (chronic obstructive pulmonary disease)   . History of lung cancer   . Hypertension   . Paroxysmal atrial fibrillation   . Peripheral vascular disease   . Venous insufficiency   . Hyperlipidemia   . Diabetes mellitus   . Nontoxic multinodular goiter   . Obesity   . GERD (gastroesophageal reflux disease)   . History of nephrolithiasis   . History of bladder cancer   . DJD (degenerative joint disease)   . Osteopenia   . Anxiety     ALLERGIES:  is allergic to cephalexin; codeine; epinephrine; erythromycin; morphine; and sulfamethoxazole w/trimethoprim.  MEDICATIONS:    Current Outpatient Prescriptions  Medication Sig Dispense Refill  . ACTONEL 150 MG tablet TAKE 1 TABLET BY MOUTH EVERY MONTH.  1 tablet  3  . albuterol (PROVENTIL HFA) 108 (90 BASE) MCG/ACT inhaler Inhale 2 puffs into the lungs every 6 (six) hours as needed.        [name] aspirin 81 MG tablet Once a day       . Biotin 5000 MCG TABS Take 1 tablet by mouth daily.      . Coenzyme Q10 (COQ10) 200 MG CAPS Take 1 capsule by mouth daily.      . Cyanocobalamin (VITAMIN B 12 PO) Take by mouth. 1000 mcg once a day       . DIOVAN HCT 160-12.5 MG per tablet TAKE 1 TABLET BY MOUTH ONCE DAILY  30 tablet  10  . Diphenhyd-Hydrocort-Nystatin SUSP Take 1 tsp swish and swallow four times a day  6 oz  5  . diphenhydrAMINE (BENADRYL) 25 MG tablet As needed       . FENOGLIDE 120 MG TABS TAKE 1 TABLET BY MOUTH ONCE DAILY  30 tablet  0  . Fluticasone-Salmeterol (ADVAIR DISKUS) 500-50 MCG/DOSE AEPB Inhale 1 puff into the lungs 2 (two) times daily.  60 each  3  . furosemide (LASIX) 20 MG tablet take 1 tablet by mouth once daily if needed for SWELLING  30 tablet  PRN  . Glucosamine-Chondroit-Vit C-Mn (GLUCOSAMINE CHONDR 1500 COMPLX) CAPS Take 1 capsule by mouth daily.      [name] guaiFENesin (MUCINEX) 600  MG 12 hr tablet Take 1,200 mg by mouth 2 (two) times daily.        . meloxicam (MOBIC) 7.5 MG tablet Take 1 tablet (7.5 mg total) by mouth daily.  30 tablet  5  . metFORMIN (GLUCOPHAGE) 500 MG tablet take 1 tablet by mouth every morning  30 tablet  6  . methylPREDNIsolone (MEDROL DOSPACK) 4 MG tablet follow package directions  21 tablet  0  . Multiple Vitamin (MULTIVITAMIN PO) Take by mouth. Once a day       . Omega-3 Fatty Acids (FISH OIL) 1200 MG CAPS Take by mouth. Once a day      . SPIRIVA HANDIHALER 18 MCG inhalation capsule INHALE THE CONTENTS OF 1 CAPSULE VIA HANDIHALER ONCE DAILY.  30 capsule  5  . traMADol (ULTRAM) 50 MG tablet Take 1 tablet (50 mg total) by mouth 3 (three) times daily as needed.  90 tablet  5  .  verapamil (CALAN-SR) 240 MG CR tablet TAKE 1 TABLET BY MOUTH ONCE DAILY  30 tablet  10  . Vitamin D, Ergocalciferol, (DRISDOL) 50000 UNITS CAPS take 1 capsule by mouth every week  4 capsule  12  . DISCONTD: omeprazole (PRILOSEC) 20 MG capsule Take 20 mg by mouth daily.          SURGICAL HISTORY:  Past Surgical History  Procedure Date  . S/p right rotator cuff repair 1991    Dr. Meade Maw  . S/p turbt 10/01    Dr. Vonita Moss  . S/p left tkr 2002    Dr. Darrelyn Hillock  . 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 S/p d&c for removal of endometrial polyp (benign) 12/10    GYN    REVIEW OF SYSTEMS:  A comprehensive review of systems was negative except for: Respiratory: positive for dyspnea on exertion and wheezing   PHYSICAL EXAMINATION: General appearance: alert, cooperative and no distress Neck: no adenopathy Lymph nodes: Cervical, supraclavicular, and axillary nodes normal. Resp: clear to auscultation bilaterally Cardio: regular rate and rhythm, S1, S2 normal, no murmur, click, rub or gallop GI: soft, non-tender; bowel sounds normal; no masses,  no organomegaly Extremities: extremities normal, atraumatic, no cyanosis or edema Neurologic: Alert and oriented X 3, normal strength and tone. Normal symmetric reflexes. Normal coordination and gait  ECOG PERFORMANCE STATUS: 1 - Symptomatic but completely ambulatory  Blood pressure 171/83, pulse 85, temperature 97.1 F (36.2 C), temperature source Oral, height 5\' 1"  (1.549 m), weight 189 lb 4.8 oz (85.866 kg).  LABORATORY DATA: Lab Results  Component Value Date   WBC 12.0* 10/02/2010   HGB 14.4 10/02/2010   HCT 41.9 10/02/2010   MCV 94.8 10/02/2010   PLT 258 10/02/2010      Chemistry      Component Value Date/Time   NA 141 04/24/2011 0951   K 5.2* 04/24/2011 0951   CL 106 04/24/2011  0951   CO2 28 04/24/2011 0951   BUN 27* 04/24/2011 0951   CREATININE 0.7 04/24/2011 0951      Component Value Date/Time   CALCIUM 10.3 04/24/2011 0951   ALKPHOS 40 10/02/2010 0531   AST 25 10/02/2010 0531   ALT 32 10/02/2010 0531   BILITOT 0.3 10/02/2010 0531       RADIOGRAPHIC STUDIES: Ct Chest W Contrast  04/30/2011  *RADIOLOGY REPORT*  Clinical Data: Status post bilateral lung resection for lung cancer.  Bladder cancer with surgery in 2001.  Cough and shortness of breath.  CT CHEST WITH CONTRAST  Technique:  Multidetector CT imaging of the chest was performed following the standard protocol during bolus administration of intravenous contrast.  Contrast:  80 ml Omnipaque-300  Comparison: 10/29/2010  Findings: Lung windows demonstrate bilateral surgical changes. Mild centrilobular emphysema.  7 mm ground-glass opacity at the right lung base on image 41. Likely similar on image 43 of the prior. Ground-glass interstitial opacity just cephalad in the right lower lobe on image 39 is not definitely present on the prior.  This measures 7 mm.  The left anterior lung 6 mm ground-glass opacity image 15 is similar. Left apical granuloma.  Soft tissue windows demonstrate no supraclavicular adenopathy. Tiny low density bilateral thyroid nodules, nonspecific.  Normal heart size with multivessel coronary artery atherosclerosis. Minimal pericardial fluid is unchanged.  No pleural effusion. No central pulmonary embolism, on this non-dedicated study.  No mediastinal or hilar adenopathy.  Limited abdominal imaging demonstrates mild hepatic steatosis. Normal adrenal glands.  Left-sided rib surgical defect on image 28, unchanged. Accentuation of expected thoracic kyphosis.  IMPRESSION:  1.  Surgical changes bilaterally.  No evidence of recurrent or metastatic disease. 2.  Vague ground-glass nodules, as detailed above. These are primarily felt to be similar.  Right lung base density appears new. Recommend attention on  follow-up.  One or more low grade adenocarcinomas cannot be excluded.  Original Report Authenticated By: Consuello Bossier, M.D.    ASSESSMENT: This is a very pleasant 77 years old white female with history of multifocal adenocarcinoma of the lung status post right upper lobectomy as well as wedge resection of the left upper lobe. She is currently on observation with no evidence for disease progression but she still has a few areas of groundglass opacity suspicious for low-grade adenocarcinoma.   PLAN: I discussed the scan results with the patient and recommended for her continuous observation for now with repeat CT scan of the chest in 6 months. She was advised to call me immediately if she has any concerning symptoms in the interval.  All questions were answered. The patient knows to call the clinic with any problems, questions or concerns. We can certainly see the patient much sooner if necessary.

## 2011-05-25 ENCOUNTER — Other Ambulatory Visit: Payer: Self-pay | Admitting: Pulmonary Disease

## 2011-06-16 ENCOUNTER — Other Ambulatory Visit: Payer: Self-pay | Admitting: Adult Health

## 2011-06-18 ENCOUNTER — Telehealth: Payer: Self-pay | Admitting: Pulmonary Disease

## 2011-06-18 NOTE — Telephone Encounter (Signed)
Please advise if ok to refill. Thanks 

## 2011-06-18 NOTE — Telephone Encounter (Signed)
Made in error. Emily E McAlister  °

## 2011-06-21 ENCOUNTER — Ambulatory Visit (INDEPENDENT_AMBULATORY_CARE_PROVIDER_SITE_OTHER): Payer: Medicare Other | Admitting: Adult Health

## 2011-06-21 ENCOUNTER — Encounter: Payer: Self-pay | Admitting: Adult Health

## 2011-06-21 VITALS — BP 124/66 | HR 72 | Temp 98.4°F | Ht 62.0 in | Wt 194.0 lb

## 2011-06-21 DIAGNOSIS — K219 Gastro-esophageal reflux disease without esophagitis: Secondary | ICD-10-CM

## 2011-06-21 NOTE — Progress Notes (Signed)
Subjective:    Patient ID: [name], female    DOB: 1934-10-30, 77 y.o.   MRN: [id number]  HPI 77 y/o WF here for a follow up visit... she has mult medical specialists who follow all of her medical problems (see below)...  ~  September 25, 2009:  20mo ROV- c/o cough "at least 2 times per day" & tongue sore... she has had follow ups w/ her specialists:  DrRamos 5/11 for LBP to right leg & given epid steroid shot (improved)...  DrBurney 6/11 CXR stable & CT planned in Aug, she had some dypnea which he related to the amt of lung tissue resected...  DrGerkin 6/11 for bilat thyroid nodules w/ benign bx- f/u sonar w/ multinod goiter, no ch in nodules, TSH= WNL.[name].  she has f/u appt w/ DrMohammed in several weeks...  ~  December 25, 2009:  Add-on appt for "sinus"- c/o right eye problem "it's allergy" & notes red, swollen, right sided facial pain & HA;  notes drainage "it pours" mucus, congestion; hurts in her teeth down to her jaw, but denies fever, discolored phlegm or blood... 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  we discussed checking sinus XRays & treating her w/ Depo/ Pred, Augmentin, Mucinex >> if symptoms persist she will need Neuro f/u for ?atypic facial pain?  ~  April 12, 2010:  She notes mult somatic concerns> SOB- "it's my weight", Cluster HAs- improved off wine "hopefully they are gone for the next 16yr cycle", right great toenail infection surg by podiatry & finally improved but reactions to Kelflex & Septra w/ nausea & diarrhea...     She had CT 3/12 per DrMohamed> COPD & post surg changes, <1cm ground glass nodule LUL w/o change, stable/ NAD, hep steatosis;  they plan continued watchful waiting...    BP controlled on meds> 140/76 today & similar at home;  needs better diet & wt reduction to keep from having to incr her BP meds...    Chol looks fair on Fenoglide + FishOil  as she is intol to all statins> TChol 215, TG 181, HDL 40, LDL 157...    DM control is adeq on the Metformin alone>  Bs=140, A1c=6.8, but wt is up 11# to 197# & we reviewed low carb, low chol/fat diets...    Thyroid> TSH=1.08 (not on meds) & she reports being released by DrGerkins, no change x39yrs...    LABS 04/09/10 also shows Vit D still low at 24 ?on 2000u daily supplement> she prefers 50K weekly & we wrote Rx... we discussed Shingles vaccine for her at her convenience...  ~  October 04, 2010:  61mo ROV & post hosp visit>  She has had mult bronchitic exac over the interval, seen by TP w/ antibiotic & Pred rx; she has maintained a hectic personal life & travel schedule (she likes DepoMedrol shots prior to her trips;  Recent refractory AB episode w/o the usual improvement on Rx & reported choking on her dinner w/ incr dyspnea & hypoxemia requiring Hosp ==> responded slowly to Rx until she coughed up a chunk of onion w/ more rapid improvement after that;  disch on Pred taper, Dulera, Spiriva, Mucinex, etc; she was seen by speech path but no asp seen w/ any consistancy tested & rec for sm bites, chew carefully, swallow deliberately & slowly...     Since disch she's  been stable (see meds below) & PFT today w/ severe airflow obstruction & FEV1=1.08 (56%), %1sec=48...  ~  December 05, 2010:  59mo ROV & she notes incr SOB w/ activ, & states she never got back to her prev baseline after last hosp & upset because she missed 2 trips; she notes that she hurt her back & hasn't been exercising because of this- since then incr SOB w/ some ADLs and incr edema in her legs;  She notes that she "can't get a deep breath" & she appears more anxious than usual w/ mult somatic complaints- indigestion, cramps in hands & feet, etc...  We discussed taking the Alprazolam 0.25mg  Tid regularly for her chest wall musc spasm so she can get a good deep breath & see how this works (she is asking about Accolate because a friend is on it & it  really helped them)...    COPD w/ revers component> on Advair500 but only using it once/d (she stopped [facility] stating it made her mouth sore), Spiriva, Mucinex, & Proventil HFA; asked (again) to maximize her regimen w/ AdvairBid, Mucinex2Bid, Fluids, etc; last PFT w/ FEV1=1.08 stable; see above...    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 NOTE: element of lung restriction from surg & obesity...    HBP> on DiovanHCT, Verapamil, & Lasix prn; BP= 144/80 & similar at home; she has mult somatic complaints...    Ven Insuffic> she knows to elim sodium but she eats out often, elev legs, wear support hose; she has been taking the Lasix20mg  daily due to the swelling & improved...     Hyperlipid> on Fenoglide120 & FishOil; she states INTOL to all statins; last FLP 8/12 in hosp as below; asked to ret for fasting blood work...    DM> on Metform500/d; BS=141, A1c=7.0; she will need more meds if she can't get on diet 7 get weight down...    Obesity> wt=198# which is up 12# in the last 59mo!!! We reviewed low carb, low fat, wt reducing diet 7 exercise prescription...    Multinod Goiter> prev eval from [place] DrGerkin; TFTs showed TSH=0.97, and FreeT3/ FreeT4 are wnl...    GI> GERD, Polyps> on Prilosec & had episode of choking on dinner w/ resp exac- see speech path eval; followed by DrGessner for GI w/ 54yr f/u colonoscopy due soon...    GU> Bladder Cancer> Papilllary TCCa resected 2001 & no recurrence; followed by DrPeterson w/ yearly cystos neg...    DJD/ LBP> on musc relaxer & Advil; prev rotator cuff surg 1991, left TKR 2002, now followed by DrRamos for LBP & plans shot in back (she reports it's improved spontaneously)...    Osteopenia> on Actonel, calcium, MVI, Vit D; ?when her last BMD was done?     Anxiety> as noted- she has Alpraz for prn use but doesn't take it often...  ~  April 30, 2011:  34mo ROV & [name] has been stable w/ her severe lung disease as noted; unfortunately she still self-adjusts her meds despite my admonition to take meds regularly as prescribed for max benefit; she tells me she has "6 trips" coming up & she wants her "magic shot" meaning DepoMedrol; we have on numerous occas discussed the importance of minimizing the cortisone Rx & maximizing her bronchdilators etc; plus we have implored her to decrease her  aggressive travel schedule fearing that she will have a resp exacerbation (w/ acute resp failure) [place]... See prob list below>> LABS 3/13:  FLP- not at goal on Feno120;  Chems- ok x BS=124 A1c=6.7 on Metform monotherapy...  06/21/2011 Acute OV  Complains of indigestion , heartburn for last 1 month. Burping /belching. Comes and goes.  Took expired protonix with help. Got new rx for Protonix x 1 week ago, taking Twice daily  .  Tried Prilosec-did not feel as good. Has a lot of Gas and bloating . Mild constipation with small stools .  No vomitting , bloody stools , no urinary symtpoms.  ?taking tums vs pepcid. 3-4 times daily .  No fever, exertional chest pain. No edema.  No calf pain or swelling.   Was seen by Oncology in March w/ follow up CT chest - no sign of disease progression.  Several Ground glass opaciites. Has upcoming CT for 6 months repeat.        Problem List:    << PROBLEM LIST UPDATED 04/30/11 >>  ASTHMA (ICD-493.90) / COPD (ICD-496) - ex-smoker, quit 1997... Prev on Advair500 now DULERA200 2spBid (ch due to sore throat) & SPIRIVA 1/d + MUCINEX 2Bid & PROVENTIL Prn... she was participating in [facility] (last 3/09) & stopped on her own... may have a superimposed component of restriction due to obesity & prev lung surgeries... ~  11/05: s/p RULobectomy for lung ca- (adeno w/ bronchoalveolar cell features). ~  PFT 8/08  showed FVC=2.02 (77%), FEV1=1.07 (51%), %1sec=53, mid-flows=19%pred. ~  PFT 7/09 today= FVC=1.93 (72%), FEV1=1.04 (49%), %1sec=54, mid-flows=19%pred. ~  1/11:  s/p LUL resection by DrBurney> multicentric bronchoalveolar cell cancer. ~  PFT 8/12 showed FVC=2.25 (88%), FEV1=1.08 (56%), %1sec=48, mid-flows=30% pred. ~  10/12:  She reports Dulera caused sore mouth so she switched back to Advair500 but asked to do this Bid regularly.  Hx of LUNG CANCER (ICD-162.9) - Hx multicentric bronchoalveolar cell lung cancer >> followed by DrBurney for CVTS & DrMohammed for Oncology... ~  s/p right upper lobectomy by DrBurney 11/05 for a stage 1A non-small cell lung cancer (adenocarcinoma w/ bronchalveolar cell features); post-op observation only... ~  CT Chest 11/08 showed no recurrence, mult bilat nodules unchanged x3+ yrs, nodular thyroid w/o change... ~  CXR 7/09 showed stable post-op changes and scarring on the right, NAD.[name]. ~  CTAngio Chest 10/09 showed neg for PE, prom thyroid, atherosclerotic changes in Ao, no change in ground-glass nodules in LUL area... ~  CT Chest 11/10 by DrMohammed showed new LUL solid nodule, no change in ground-glass areas... lesion was PET pos... ~  1/11:  s/p LULobectomy & node dissection via minithoracotomy by DrBurney- path showed 3 foci of well diff bronchoalveolar cell ca & neg nodes... decision made at conference for no chemoRx, EGFR assay was neg... ~  she continues to have monitoring by DrBurney/ DrMohammed> on observation alone; last note from DrMohammed 9/12 indicated she was stable & he plans f/u CT Chest & OV in 54mo... ~  Last CT Chest 9/12 showed stable 9mm ground glass opac LUL w/o change from 3/12 scan; +post op changes, COPD, coronary calcif, & hep steatosis...  HYPERTENSION (ICD-401.9) - on DIOVAN/Hct 160-12.5 Daily, VERAPAMIL SR 240mg /d, & LASIX 20mg  "Prn"... BP= 122/72 & feeling OK, tolerating Rx etc... denies CP, palipit, dizziness, syncope, ch in dyspnea, edema,  etc...  ~  Cardiac eval 9/09 by Walker Kehr was neg and 2DEcho showed DD w/ norm LVsys function,  EF= 60-65%, no regional wall motion abn...  PAROXYSMAL ATRIAL FIBRILLATION (ICD-427.31) - hx PAF in the post op period... converted to NSR & holding... transiently on Amiodarone & she wanted off Rx...  PERIPHERAL VASCULAR DISEASE (ICD-443.9) - she has atherosclerotic changes in her Ao noted on her prev scans...  ~  11/10: pt had mult questions about this problem on the prob list- discussed "hardening of the arteries" in detail.  VENOUS INSUFFICIENCY (ICD-459.81) - she knows to follow a low sodium diet, elevate legs, wear support hose, etc; she insists on keeping Lasix 20mg  on hand for Prn use...  HYPERLIPIDEMIA (ICD-272.4) - prev on Livolo 2mg - 1/2 tab daily (stopped due to aching), +FENOGLIDE 120mg /d, FISH OIL & CoQ10  supplements... prev on Vytorin but INTOL ==> she states INTOL to all statins... she was not satis w/ the Lipid Clinic in the past. ~  labs 8/08 off Vytorin showed TChol 183, TG 207, HDL 46, LDL 112... try fenofibrate... ~  FLP 5/09 on Feno120 showed TChol 188, TG 111, HDL 28, LDL 137... cont same, better diet, get wt down! ~  FLP 4/10 on Feno120 showed TChol 240, TG 104, HDL 49, LDL 165... I rec f/u Lipid Clinic, she declined. ~  FLP 7/10 on Feno120 showed TChol 222, TG 152, HDL 36, LDL 172... rec> try LIVALO 2mg - 1/2 tab Qhs, she stopped. ~  FLP 11/10 on Feno120+FishOil showed TChol 253, TG 125, HDL 42, LDL 208... try LIVALO2mg - 1/2 tab/d & stay on it. ~  FLP 1/11 on Liv1mg +Feno120 showed TChol 170, TG 101, HDL 45, LDL 105... continue same. ~  3/11: she reports aching all over & DrBurney stopped the Livolo> contin diet + other meds. ~  FLP 8/11 showed TChol 225, TG 238, HDL 41, LDL 156... INTOL all statins, she'll do the best she can w/ diet. ~  FLP 8/12 in hosp showed TChol 234, TG 276, HDL 52, LDL 127... Counseled on low chol, low fat, wt reducing diet. ~  FLP 3/13 on Feno120 showed  TChol 221, TG 85, HDL 56, LDL 159... She refuses Statin Rx or Lipid Clinic referral...  DIABETES MELLITUS (ICD-250.00) - started on METFORMIN 500mg Bid 4/10, but decr on her own to 1 daily 1/11... we had stressed the importance of diet- low carb/ low fat and weight reduction, along w/ her pulm rehab exercises... ~  labs 4/10 showed BS= 131, HgA1c= 7.0.Marland KitchenMarland Kitchen Metformin500Bid started. ~  labs 7/10 showed BS= 134, A1c= 6.0 ~  labs 11/10 showed BS= 148, A1c= 6.5 ~  1/11:  she cut the Metformin to 1/d due to nausea. ~  labs 8/11 showed BS= 137, A1c= 5.7.Marland KitchenMarland Kitchen continue same, get wt down. ~  Labs 8/12 in hosp on Metform500/d showed BS= 110-230, A1c=6.5 ~  Labs 10/12 on Metform500/d showed BS= 141, A1c=7.0.Marland KitchenMarland Kitchen Needs better diet, get wt down, or more meds. ~  1/13:  Ophthalmology check up by DrMcCuen> neg- no diabetic retinopathy... ~  Labs 3/13 on Metform500/d showed BS= 124, A1c= 6.7  NONTOXIC MULTINODULAR GOITER (ICD-241.1) - eval and rx by DrBalan for Endocrinology & DrGerkin for CCS; she is asymptomatic; dominant nodule was needled and benign; surg consult from DrGerkin- elected observation & he checks her yearly... ~  seen 6/10 by DrGerkin- f/u sonar w/o change in any of the nodules... ~  labs 7/10 showed TSH= 0.89 ~  seen 6/11 by DrGerkin- f/u sonar w/o change in nodules. ~  Labs 8/12 showed TSH= 0.047... Not on Thyroid meds, needs f/u DrBalan (she  never went). ~  Labs 10/12 showed TSH= 0.97, FreeT3= 3.2 (2.3-4.2), FreeT4= 0.88 (0.60-1.60)  OBESITY (ICD-278.00) - obese w/ abd panniculus & we discussed diet + exercise strategies.[name]. ~  weight up to 205# 11/09- we discussed diet, calorie restriction, exercise, & get the weight down... ~  weight 4/10 = 198# ~  weight 7/10 = 194# ~  weight 11/10 = 196# ~  weight 2/11 = 184# (post op) ~  weight 8/11 = 181# ~  weight 11/11 = 186#... she needs to do better! ~  Weight 8/12 = 186# ~  Weight 10/12 = 198#... What happened? ~  Weight 3/13 = 189#... Keep up the  good work!  GERD (ICD-530.81) - on PRILOSEC 20mg /d... ~  colonoscopy 7/09 by Rodena Medin showed 4 sm polyps= tubular adenoma on bx... f/u planned 3 yrs...  NEPHROLITHIASIS, HX OF (ICD-V13.01)  Hx of BLADDER CANCER (ICD-188.9) - had hematuria in 2001 & referred to DrPeterson... 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 all subseq cystoscopies have been neg- no recurrence. ~  she reports f/u w/ DrPeterson yearly & doing satis by her report.  DEGENERATIVE JOINT DISEASE (ICD-715.90) - s/p rotator cuff repair in 1991... s/p left TKR from DrGioffre in 2002...  OSTEOPENIA (ICD-733.90) - on ACTONEL 150/mo, ca++, MVI, etc... ~  labs 8/11 showed Vit D level = 22... rec> start Vit D supplement extra 02-1998 u daily...  ANXIETY (ICD-300.00) - on XANAX 0.25mg  Prn... she has signif hx of claustrophobia, but denies that she has any anxiety...   Past Surgical History  Procedure Date  . S/p right rotator cuff repair 1991    Dr. Meade Maw  . S/p turbt 10/01    Dr. Vonita Moss  . S/p left tkr 2002    Dr. Darrelyn Hillock  . 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 S/p d&c for removal of endometrial polyp (benign) 12/10    GYN    Outpatient Encounter Prescriptions as of 06/21/2011  Medication Sig Dispense Refill  . ACTONEL 150 MG tablet TAKE 1 TABLET BY MOUTH EVERY MONTH.  1 tablet  3  . albuterol (PROVENTIL HFA) 108 (90 BASE) MCG/ACT inhaler Inhale 2 puffs into the lungs every 6 (six) hours as needed.        [name] aspirin 81 MG tablet Once a day       . Biotin 5000 MCG TABS Take 1 tablet by mouth daily.      . Coenzyme Q10 (COQ10) 200 MG CAPS Take 1 capsule by mouth daily.      . Cyanocobalamin (VITAMIN B 12 PO) Take by mouth. 1000 mcg once a day       . DIOVAN HCT 160-12.5 MG per tablet TAKE 1  TABLET BY MOUTH ONCE DAILY  30 tablet  10  . Diphenhyd-Hydrocort-Nystatin SUSP Take 1 tsp swish and swallow four times a day  6 oz  5  . diphenhydrAMINE (BENADRYL) 25 MG tablet As needed       . FENOGLIDE 120 MG TABS TAKE 1 TABLET BY MOUTH ONCE DAILY  30 tablet  0  . Fluticasone-Salmeterol (ADVAIR DISKUS) 500-50 MCG/DOSE AEPB Inhale 1 puff into the lungs 2 (two) times daily.  60 each  3  . furosemide (LASIX) 20 MG tablet take 1 tablet by mouth once  daily if needed for SWELLING  30 tablet  PRN  . Glucosamine-Chondroit-Vit C-Mn (GLUCOSAMINE CHONDR 1500 COMPLX) CAPS Take 1 capsule by mouth daily.      [name] guaiFENesin (MUCINEX) 600 MG 12 hr tablet Take 1,200 mg by mouth 2 (two) times daily.        . meloxicam (MOBIC) 7.5 MG tablet Take 1 tablet (7.5 mg total) by mouth daily.  30 tablet  5  . metFORMIN (GLUCOPHAGE) 500 MG tablet take 1 tablet by mouth every morning  30 tablet  6  . Multiple Vitamin (MULTIVITAMIN PO) Take by mouth. Once a day       . Omega-3 Fatty Acids (FISH OIL) 1200 MG CAPS Take by mouth. Once a day      . pantoprazole (PROTONIX) 40 MG tablet take 1 tablet by mouth twice a day  60 tablet  0  . SPIRIVA HANDIHALER 18 MCG inhalation capsule INHALE THE CONTENTS OF 1 CAPSULE VIA HANDIHALER ONCE DAILY.  30 capsule  5  . traMADol (ULTRAM) 50 MG tablet Take 1 tablet (50 mg total) by mouth 3 (three) times daily as needed.  90 tablet  5  . verapamil (CALAN-SR) 240 MG CR tablet TAKE 1 TABLET BY MOUTH ONCE DAILY  30 tablet  10  . Vitamin D, Ergocalciferol, (DRISDOL) 50000 UNITS CAPS take 1 capsule by mouth every week  4 capsule  12    Allergies                                          Pt states INTOL to ALL STATINS...  Allergen Reactions  . Cephalexin     REACTION: nausea and diarrhea  . Codeine     REACTION: nausea  . Epinephrine     REACTION: nervous  . Erythromycin     REACTION: all mycins cause yeast infections  . Morphine     REACTION: nausea  . Sulfamethoxazole W/Trimethoprim      REACTION: nausea and diarrhea    Current Medications, Allergies, Past Medical History, Past Surgical History, Family History, and Social History were reviewed in [employer] record.    Review of Systems         Constitutional:   No  weight loss, night sweats,  Fevers, chills,  +fatigue, or  lassitude.  HEENT:   No headaches,  Difficulty swallowing,  Tooth/dental problems, or  Sore throat,                No sneezing, itching, ear ache, nasal congestion, post nasal drip,   CV:  No chest pain,  Orthopnea, PND, swelling in lower extremities, anasarca, dizziness, palpitations, syncope.   GI  +++ heartburn, indigestion, No  abdominal pain, nausea, vomiting, diarrhea,  loss of appetite, bloody stools.   Resp: .  No excess mucus, no productive cough,  No non-productive cough,  No coughing up of blood.  No change in color of mucus.    No chest wall deformity  Skin: no rash or lesions.  GU: no dysuria, change in color of urine, no urgency or frequency.  No flank pain, no hematuria   MS:  No joint pain or swelling.  No decreased range of motion.  No back pain.  Psych:  No change in mood or affect. No depression or anxiety.  No memory loss.       Objective:   Physical Exam  WD, WN, 77 y/o  WF in NAD, overweight  GENERAL:  Alert & oriented; pleasant & cooperative... HEENT:  [place]/AT,   EACs-clear, TMs-wnl, NOSE-clear, THROAT-clear & wnl. NECK:  Supple w/ fairROM; no JVD; normal carotid impulses w/o bruits; no thyromegaly or nodules palpated; no lymphadenopathy. CHEST:  Decr BS bilat;  Clear except for upper airway psuedowheezing on forced expiration.  HEART:  Regular Rhythm; without murmurs/ rubs/ or gallops heard... ABDOMEN:  Obese w/ panniculus, soft & nontender; normal bowel sounds; no organomegaly or masses detected. EXT:  S/P left TKR; mild arthritic changes  DERM:  No lesions noted; no rash etc...     Assessment & Plan:

## 2011-06-21 NOTE — Patient Instructions (Addendum)
We are referring to GI doctor  Protonix 40mg   before meal Twice daily   Add Pepcid 20mg  At bedtime   GERD diet  Gas  X w/ meals.  Begin Align Probiotic daily .  Please contact office for sooner follow up if symptoms do not improve or worsen or seek emergency care

## 2011-06-21 NOTE — Assessment & Plan Note (Signed)
Persistent GERD despite therapy  Continue on PPI Twice daily   Add Pepcid At bedtime   GERD diet  Gas x As needed   Add Align daily  Refer to GI  Please contact office for sooner follow up if symptoms do not improve or worsen or seek emergency care

## 2011-06-24 ENCOUNTER — Encounter: Payer: Self-pay | Admitting: Internal Medicine

## 2011-06-24 ENCOUNTER — Telehealth: Payer: Self-pay | Admitting: Internal Medicine

## 2011-06-24 NOTE — Telephone Encounter (Signed)
Patient is rescheduled with Rhonda for 06/27/11 10:30

## 2011-06-24 NOTE — Progress Notes (Signed)
Addended by: Boone Master E on: 06/24/2011 10:38 AM   Modules accepted: Orders

## 2011-06-27 ENCOUNTER — Encounter: Payer: Self-pay | Admitting: *Deleted

## 2011-06-27 ENCOUNTER — Ambulatory Visit (INDEPENDENT_AMBULATORY_CARE_PROVIDER_SITE_OTHER): Payer: Medicare Other | Admitting: Physician Assistant

## 2011-06-27 VITALS — BP 134/78 | HR 64 | Ht 62.0 in | Wt 189.0 lb

## 2011-06-27 DIAGNOSIS — R1013 Epigastric pain: Secondary | ICD-10-CM

## 2011-06-27 DIAGNOSIS — Z86010 Personal history of colonic polyps: Secondary | ICD-10-CM

## 2011-06-27 DIAGNOSIS — R110 Nausea: Secondary | ICD-10-CM

## 2011-06-27 MED ORDER — MOVIPREP 100 G PO SOLR: ORAL | Status: DC

## 2011-06-27 NOTE — Patient Instructions (Signed)
We have scheduled the Ultrasound for tomorrow 06-28-2011. Arrive to [facility] on the 1st floor at 2:45 PM. Their phone number is [phone].  Do not eat or drink anything after 9:00 AM . We sent a prescription for the Moviprep to [facility]. Stop the Mobic and the Advil. Take Pepcid AC 2 tablets in the morning and 2 tablets before dinner. You can get this at the pharmacy, [facility], [facility] or [facility].  We scheduled the Endoscopy and Colonoscopy on 06-30-2011. Directions provided. Marland KitchenUpper GI Endoscopy Upper GI endoscopy means using a flexible scope to look at the esophagus, stomach, and upper small bowel. This is done to make a diagnosis in people with heartburn, abdominal pain, or abnormal bleeding. Sometimes an endoscope is needed to remove foreign bodies or food that become stuck in the esophagus; it can also be used to take biopsy samples. For the best results, do not eat or drink for 8 hours before having your upper endoscopy.  To perform the endoscopy, you will probably be sedated and your throat will be numbed with a special spray. The endoscope is then slowly passed down your throat (this will not interfere with your breathing). An endoscopy exam takes 15 to 30 minutes to complete and there is no real pain. Patients rarely remember much about the procedure. The results of the test may take several days if a biopsy or other test is taken.  You may have a sore throat after an endoscopy exam. Serious complications are very rare. Stick to liquids and soft foods until your pain is better. Do not drive a car or operate any dangerous equipment for at least 24 hours after being sedated. SEEK IMMEDIATE MEDICAL CARE IF:   You have severe throat pain.   You have shortness of breath.   You have bleeding problems.   You have a fever.   You have difficulty recovering from your sedation.  Document Released: 02/29/2004 Document Revised: 01/10/2011 Document Reviewed:  01/24/2008 [facility] Patient Information 2012 [place], [place].Colonoscopy A colonoscopy is an exam to evaluate your entire colon. In this exam, your colon is cleansed. A long fiberoptic tube is inserted through your rectum and into your colon. The fiberoptic scope (endoscope) is a long bundle of enclosed and very flexible fibers. These fibers transmit light to the area examined and send images from that area to your caregiver. Discomfort is usually minimal. You may be given a drug to help you sleep (sedative) during or prior to the procedure. This exam helps to detect lumps (tumors), polyps, inflammation, and areas of bleeding. Your caregiver may also take a small piece of tissue (biopsy) that will be examined under a microscope. LET YOUR CAREGIVER KNOW ABOUT:   Allergies to food or medicine.   Medicines taken, including vitamins, herbs, eyedrops, over-the-counter medicines, and creams.   Use of steroids (by mouth or creams).   Previous problems with anesthetics or numbing medicines.   History of bleeding problems or blood clots.   Previous surgery.   Other health problems, including diabetes and kidney problems.   Possibility of pregnancy, if this applies.  BEFORE THE PROCEDURE   A clear liquid diet may be required for 2 days before the exam.   Ask your caregiver about changing or stopping your regular medications.   Liquid injections (enemas) or laxatives may be required.   A large amount of electrolyte solution may be given to you to drink over a short period of time. This solution  is used to clean out your colon.   You should be present 60 minutes prior to your procedure or as directed by your caregiver.  AFTER THE PROCEDURE   If you received a sedative or pain relieving medication, you will need to arrange for someone to drive you home.   Occasionally, there is a little blood passed with the first bowel movement. Do not be concerned.  FINDING OUT THE RESULTS OF YOUR TEST Not  all test results are available during your visit. If your test results are not back during the visit, make an appointment with your caregiver to find out the results. Do not assume everything is normal if you have not heard from your caregiver or the medical facility. It is important for you to follow up on all of your test results. HOME CARE INSTRUCTIONS   It is not unusual to pass moderate amounts of gas and experience mild abdominal cramping following the procedure. This is due to air being used to inflate your colon during the exam. Walking or a warm pack on your belly (abdomen) may help.   You may resume all normal meals and activities after sedatives and medicines have worn off.   Only take over-the-counter or prescription medicines for pain, discomfort, or fever as directed by your caregiver. Do not use aspirin or blood thinners if a biopsy was taken. Consult your caregiver for medicine usage if biopsies were taken.  SEEK IMMEDIATE MEDICAL CARE IF:   You have a fever.   You pass large blood clots or fill a toilet with blood following the procedure. This may also occur 10 to 14 days following the procedure. This is more likely if a biopsy was taken.   You develop abdominal pain that keeps getting worse and cannot be relieved with medicine.  Document Released: 01/19/2000 Document Revised: 01/10/2011 Document Reviewed: 09/03/2007 [facility] Patient Information 2012 [place], [place].

## 2011-06-28 ENCOUNTER — Other Ambulatory Visit (HOSPITAL_COMMUNITY): Payer: Medicare Other

## 2011-06-28 ENCOUNTER — Encounter: Payer: Self-pay | Admitting: Physician Assistant

## 2011-06-28 DIAGNOSIS — Z86010 Personal history of colonic polyps: Secondary | ICD-10-CM | POA: Insufficient documentation

## 2011-06-28 NOTE — Progress Notes (Signed)
Subjective:    Patient ID: [name], female    DOB: April 30, 1934, 77 y.o.   MRN: [id number]  HPI   [name] is a 77 year old white female known to Dr. Leone Payor who has history of chronic GERD and adenomatous colon polyps. She had actually undergone colonoscopy in July of 2009 and at that time had multiple polyps removed which were tubular adenomas and she was to followup in 3 years. She has not had followup since that time. She does have multiple other medical problems including a history of asthma, COPD, hypertension, atrial for ablation, peripheral vascular disease, diabetes, bladder cancer, and is status post right upper lobectomy in 2005 4 a stage I A. 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  She comes in today with complaints of a two-week history of nausea she's been fairly constant is not seem to be affected by by mouth intake. She says she also has had 2 "bad" episodes of careful indigestion over those 2 weeks and has had a lot of belching burping and fullness in her chest. She has not been vomiting. She says she did have a sweat with one of these episodes. She denies any abdominal pain she says her bowel movements have been more frequent but she's been taking a lot of antacids. She has not noted any melena or hematochezia. He says she does have some intermittent solid food dysphagia especially with pills. She started on proton ex-lax week but thus far does not feel any better.  She had been taking Mobic are not daily and also had been using Advil both of which she has stopped. Patient states she has been taking at least 6 Pepcid a.c. everyday feels that Pepcid [facility] helps more than anything. She's also been taking some Gas-X with meals.    Review of Systems  Constitutional: Negative.   HENT: Positive for trouble swallowing.   Eyes: Negative.   Respiratory: Negative.     Cardiovascular: Negative.   Gastrointestinal: Positive for nausea and abdominal pain.  Genitourinary: Negative.   Musculoskeletal: Positive for back pain and arthralgias.  Neurological: Negative.   Hematological: Negative.   Psychiatric/Behavioral: Negative.    Outpatient Encounter Prescriptions as of 06/27/2011  Medication Sig Dispense Refill  . ACTONEL 150 MG tablet TAKE 1 TABLET BY MOUTH EVERY MONTH.  1 tablet  3  . albuterol (PROVENTIL HFA) 108 (90 BASE) MCG/ACT inhaler Inhale 2 puffs into the lungs every 6 (six) hours as needed.        [name] aspirin 81 MG tablet Once a day       . Biotin 5000 MCG TABS Take 1 tablet by mouth daily.      . cetirizine (ZYRTEC) 10 MG tablet Take 10 mg by mouth daily.      . Coenzyme Q10 (COQ10) 200 MG CAPS Take 1 capsule by mouth daily.      . Cyanocobalamin (VITAMIN B 12 PO) Take by mouth. 1000 mcg once a day       . DIOVAN HCT 160-12.5 MG per tablet TAKE 1 TABLET BY MOUTH ONCE DAILY  30 tablet  10  . Famotidine (PEPCID PO) Take 1 tablet by mouth at bedtime.      [name] FENOGLIDE 120 MG TABS TAKE 1 TABLET BY MOUTH ONCE DAILY  30 tablet  0  . Fluticasone-Salmeterol (ADVAIR DISKUS) 500-50 MCG/DOSE AEPB Inhale 1 puff into  the lungs 2 (two) times daily.  60 each  3  . furosemide (LASIX) 20 MG tablet take 1 tablet by mouth once daily if needed for SWELLING  30 tablet  PRN  . Glucosamine-Chondroit-Vit C-Mn (GLUCOSAMINE CHONDR 1500 COMPLX) CAPS Take 1 capsule by mouth daily.      . meloxicam (MOBIC) 7.5 MG tablet Take 7.5 mg by mouth as needed.      . metFORMIN (GLUCOPHAGE) 500 MG tablet take 1 tablet by mouth every morning  30 tablet  6  . Multiple Vitamin (MULTIVITAMIN PO) Take by mouth. Once a day       . Omega-3 Fatty Acids (FISH OIL) 1200 MG CAPS Take by mouth. Once a day      . pantoprazole (PROTONIX) 40 MG tablet take 1 tablet by mouth twice a day  60 tablet  0  . Probiotic Product (ALIGN PO) Take 1 tablet by mouth daily.      . Simethicone (GAS-X PO) Take by  mouth as directed.      [name] SPIRIVA HANDIHALER 18 MCG inhalation capsule INHALE THE CONTENTS OF 1 CAPSULE VIA HANDIHALER ONCE DAILY.  30 capsule  5  . traMADol (ULTRAM) 50 MG tablet Take 1 tablet (50 mg total) by mouth 3 (three) times daily as needed.  90 tablet  5  . verapamil (CALAN-SR) 240 MG CR tablet TAKE 1 TABLET BY MOUTH ONCE DAILY  30 tablet  10  . Vitamin D, Ergocalciferol, (DRISDOL) 50000 UNITS CAPS take 1 capsule by mouth every week  4 capsule  12  . DISCONTD: meloxicam (MOBIC) 7.5 MG tablet Take 1 tablet (7.5 mg total) by mouth daily.  30 tablet  5  . MOVIPREP 100 G SOLR Moviprep  Take as directed.  1 kit  0  . DISCONTD: Diphenhyd-Hydrocort-Nystatin SUSP Take 1 tsp swish and swallow four times a day  6 oz  5  . DISCONTD: diphenhydrAMINE (BENADRYL) 25 MG tablet As needed       . DISCONTD: guaiFENesin (MUCINEX) 600 MG 12 hr tablet Take 1,200 mg by mouth 2 (two) times daily.         Allergies  Allergen Reactions  . Cephalexin     REACTION: nausea and diarrhea  . Codeine     REACTION: nausea  . Epinephrine     REACTION: nervous  . Erythromycin     REACTION: all mycins cause yeast infections  . Morphine     REACTION: nausea  . Sulfamethoxazole W-Trimethoprim     REACTION: nausea and diarrhea   Patient Active Problem List  Diagnoses  . LUNG CANCER  . BLADDER CANCER  . NONTOXIC MULTINODULAR GOITER  . DIABETES MELLITUS  . HYPERLIPIDEMIA  . OBESITY  . ANXIETY  . HYPERTENSION  . PAROXYSMAL ATRIAL FIBRILLATION  . PERIPHERAL VASCULAR DISEASE  . VENOUS INSUFFICIENCY  . ASTHMA  . COPD  . GERD  . DEGENERATIVE JOINT DISEASE  . OSTEOPENIA  . NEPHROLITHIASIS, HX OF  . LBP (low back pain)  . Hx of adenomatous colonic polyps   History   Social History  . Marital Status: Married    Spouse Name: N/A    Number of Children: 2  . Years of Education: N/A   Occupational History  . Retired     Social History Main Topics  . Smoking status: Former Smoker    Quit date:  02/04/1997  . Smokeless tobacco: Never Used  . Alcohol Use: Yes     Occasionally  . Drug Use:  No  . Sexually Active: Not on file   Other Topics Concern  . Not on file   Social History Narrative   Daily caffeine        Objective:   Physical Exam well-developed older white female in no acute distress, pleasant blood pressure 134/78 pulse 64, height 5 foot 2 weight 189. HEENT; nontraumatic, normocephalic, EOMI, PERRLA sclera anicteric,Neck; Supple, no JVD, Cardiovascular ;regular rate and rhythm with S1-S2 no murmur or gallop, Pulmonary; clear bilaterally, Abdomen; large soft no focal tenderness other than the epigastrium no guarding no rebound no palpable mass or hepatosplenomegaly bowel sounds are active, Rectal; exam not done, Extremities; no clubbing cyanosis or edema skin warm dry, Psych; mood and affect normal and appropriate        Assessment & Plan:  #39 77 year old female multiple medical problems with two-week history of nausea and indigestion, so far nonresponsive to Protonix. Will need to rule out NSAID-induced ulcer disease or gastropathy, and gallbladder disease. #2 history of 2 separate lung cancers #3 history of bladder cancer #4 history of adenomatous colon polyps, multiple polyps on last colonoscopy and overdue for followup  #5 COPD #6 Diabetes mellitus #7 history of atrial fibrillation  Plan;  advised to stop Advil and Mobic not use any anti-inflammatories For now we'll continue Pepcid AC on 2 each morning into each evening as she feels that this works better than anything else. She'll stop the Protonix and stop the Gas-X.  Schedule for upper abdominal ultrasound Schedule for upper endoscopy and colonoscopy with Dr. Leone Payor procedures discussed in detail with the patient and she is agreeable to proceed, these will be scheduled with propofol.

## 2011-07-02 NOTE — Progress Notes (Signed)
I agree with assessment and plan.

## 2011-07-03 ENCOUNTER — Ambulatory Visit (HOSPITAL_COMMUNITY)
Admission: RE | Admit: 2011-07-03 | Discharge: 2011-07-03 | Disposition: A | Discharge status: Home / Self Care | Payer: Medicare Other | Source: Ambulatory Visit | Attending: Physician Assistant | Admitting: Physician Assistant

## 2011-07-03 DIAGNOSIS — R110 Nausea: Secondary | ICD-10-CM

## 2011-07-03 DIAGNOSIS — R1013 Epigastric pain: Secondary | ICD-10-CM | POA: Insufficient documentation

## 2011-07-05 ENCOUNTER — Encounter: Payer: Self-pay | Admitting: Adult Health

## 2011-07-05 ENCOUNTER — Ambulatory Visit (INDEPENDENT_AMBULATORY_CARE_PROVIDER_SITE_OTHER): Payer: Medicare Other | Admitting: Adult Health

## 2011-07-05 ENCOUNTER — Other Ambulatory Visit (INDEPENDENT_AMBULATORY_CARE_PROVIDER_SITE_OTHER): Payer: Medicare Other

## 2011-07-05 VITALS — BP 130/88 | HR 98 | Temp 97.5°F | Ht 62.0 in | Wt 188.0 lb

## 2011-07-05 DIAGNOSIS — J449 Chronic obstructive pulmonary disease, unspecified: Secondary | ICD-10-CM

## 2011-07-05 DIAGNOSIS — K219 Gastro-esophageal reflux disease without esophagitis: Secondary | ICD-10-CM

## 2011-07-05 DIAGNOSIS — J4489 Other specified chronic obstructive pulmonary disease: Secondary | ICD-10-CM

## 2011-07-05 LAB — CBC WITH DIFFERENTIAL/PLATELET
Eosinophils Absolute: 0.1 10*3/uL (ref 0.0–0.7)
Eosinophils Relative: 1.9 % (ref 0.0–5.0)
HCT: 41 % (ref 36.0–46.0)
Lymphs Abs: 2.1 10*3/uL (ref 0.7–4.0)
MCHC: 33.5 g/dL (ref 30.0–36.0)
MCV: 96.8 fl (ref 78.0–100.0)
Monocytes Absolute: 0.6 10*3/uL (ref 0.1–1.0)
Platelets: 251 10*3/uL (ref 150.0–400.0)
WBC: 7.9 10*3/uL (ref 4.5–10.5)

## 2011-07-05 LAB — BASIC METABOLIC PANEL
BUN: 29 mg/dL — ABNORMAL HIGH (ref 6–23)
Calcium: 10.6 mg/dL — ABNORMAL HIGH (ref 8.4–10.5)
GFR: 57.5 mL/min — ABNORMAL LOW (ref >60.00)
Glucose, Bld: 95 mg/dL (ref 70–99)
Sodium: 140 mEq/L (ref 135–145)

## 2011-07-05 MED ORDER — METHYLPREDNISOLONE ACETATE 80 MG/ML IJ SUSP
120.0000 mg | Freq: Once | INTRAMUSCULAR | Status: AC
  Administered 2011-07-05: 120 mg via INTRAMUSCULAR

## 2011-07-05 MED ORDER — PREDNISONE 10 MG PO TABS: ORAL_TABLET | ORAL | Status: DC

## 2011-07-05 NOTE — Assessment & Plan Note (Signed)
Flare   Depo Medrol x 1  Prednisone taper over next week  Steroid talk with pt

## 2011-07-05 NOTE — Assessment & Plan Note (Signed)
Hold actonel  follow up gi as planned for endo next week.

## 2011-07-05 NOTE — Patient Instructions (Addendum)
Hold Actonel for next 2 months  Continue on Advair and Spiriva  Steroid taper over next week.  Depo Medrol shot today  Follow up Dr. Kriste Basque  In 3-4 months and As needed   follow up for endoscopy next month as planned  Please contact office for sooner follow up if symptoms do not improve or worsen or seek emergency care

## 2011-07-05 NOTE — Progress Notes (Signed)
Subjective:    Patient ID: [name], female    DOB: 11-16-1934, 77 y.o.   MRN: [id number]  HPI 77 y/o WF   ~  September 25, 2009:  38mo ROV- c/o cough "at least 2 times per day" & tongue sore... she has had follow ups w/ her specialists:  DrRamos 5/11 for LBP to right leg & given epid steroid shot (improved)...  DrBurney 6/11 CXR stable & CT planned in Aug, she had some dypnea which he related to the amt of lung tissue resected...  DrGerkin 6/11 for bilat thyroid nodules w/ benign bx- f/u sonar w/ multinod goiter, no ch in nodules, TSH= WNL.[name].  she has f/u appt w/ DrMohammed in several weeks...  ~  December 25, 2009:  Add-on appt for "sinus"- c/o right eye problem "it's allergy" & notes red, swollen, right sided facial pain & HA;  notes drainage "it pours" mucus, congestion; hurts in her teeth down to her jaw, but denies fever, discolored phlegm or blood... 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  we discussed checking sinus XRays & treating her w/ Depo/ Pred, Augmentin, Mucinex >> if symptoms persist she will need Neuro f/u for ?atypic facial pain?  ~  April 12, 2010:  She notes mult somatic concerns> SOB- "it's my weight", Cluster HAs- improved off wine "hopefully they are gone for the next 85yr cycle", right great toenail infection surg by podiatry & finally improved but reactions to Kelflex & Septra w/ nausea & diarrhea...     She had CT 3/12 per DrMohamed> COPD & post surg changes, <1cm ground glass nodule LUL w/o change, stable/ NAD, hep steatosis;  they plan continued watchful waiting...    BP controlled on meds> 140/76 today & similar at home;  needs better diet & wt reduction to keep from having to incr her BP meds...    Chol looks fair on Fenoglide + FishOil as she is intol to all statins> TChol 215, TG 181, HDL 40, LDL 157...    DM control is adeq on the Metformin alone>   Bs=140, A1c=6.8, but wt is up 11# to 197# & we reviewed low carb, low chol/fat diets...    Thyroid> TSH=1.08 (not on meds) & she reports being released by DrGerkins, no change x26yrs...    LABS 04/09/10 also shows Vit D still low at 24 ?on 2000u daily supplement> she prefers 50K weekly & we wrote Rx... we discussed Shingles vaccine for her at her convenience...  ~  October 04, 2010:  51mo ROV & post hosp visit>  She has had mult bronchitic exac over the interval, seen by TP w/ antibiotic & Pred rx; she has maintained a hectic personal life & travel schedule (she likes DepoMedrol shots prior to her trips;  Recent refractory AB episode w/o the usual improvement on Rx & reported choking on her dinner w/ incr dyspnea & hypoxemia requiring Hosp ==> responded slowly to Rx until she coughed up a chunk of onion w/ more rapid improvement after that;  disch on Pred taper, Dulera, Spiriva, Mucinex, etc; she was seen by speech path but no asp seen w/ any consistancy tested & rec for sm bites, chew carefully, swallow deliberately & slowly...     Since disch she's been stable (see meds below) & PFT today w/ severe airflow obstruction & FEV1=1.08 (56%), %1sec=48...  ~  December 05, 2010:  46mo ROV & she notes incr SOB w/ activ, & states she never got back to her prev baseline after last hosp & upset because she missed 2 trips; she notes that she hurt her back & hasn't been exercising because of this- since then incr SOB w/ some ADLs and incr edema in her legs;  She notes that she "can't get a deep breath" & she appears more anxious than usual w/ mult somatic complaints- indigestion, cramps in hands & feet, etc...  We discussed taking the Alprazolam 0.25mg  Tid regularly for her chest wall musc spasm so she can get a good deep breath & see how this works (she is asking about Accolate because a friend is on it & it really helped them)...    COPD w/ revers component> on Advair500 but only using it once/d (she stopped [facility] stating  it made her mouth sore), Spiriva, Mucinex, & Proventil HFA; asked (again) to maximize her regimen w/ AdvairBid, Mucinex2Bid, Fluids, etc; last PFT w/ FEV1=1.08 stable; see above...    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 NOTE: element of lung restriction from surg & obesity...    HBP> on DiovanHCT, Verapamil, & Lasix prn; BP= 144/80 & similar at home; she has mult somatic complaints...    Ven Insuffic> she knows to elim sodium but she eats out often, elev legs, wear support hose; she has been taking the Lasix20mg  daily due to the swelling & improved...     Hyperlipid> on Fenoglide120 & FishOil; she states INTOL to all statins; last FLP 8/12 in hosp as below; asked to ret for fasting blood work...    DM> on Metform500/d; BS=141, A1c=7.0; she will need more meds if she can't get on diet 7 get weight down...    Obesity> wt=198# which is up 12# in the last 46mo!!! We reviewed low carb, low fat, wt reducing diet 7 exercise prescription...    Multinod Goiter> prev eval from [place] DrGerkin; TFTs showed TSH=0.97, and FreeT3/ FreeT4 are wnl...    GI> GERD, Polyps> on Prilosec & had episode of choking on dinner w/ resp exac- see speech path eval; followed by DrGessner for GI w/ 53yr f/u colonoscopy due soon...    GU> Bladder Cancer> Papilllary TCCa resected 2001 & no recurrence; followed by DrPeterson w/ yearly cystos neg...    DJD/ LBP> on musc relaxer & Advil; prev rotator cuff surg 1991, left TKR 2002, now followed by DrRamos for LBP & plans shot in back (she reports it's improved spontaneously)...    Osteopenia> on Actonel, calcium, MVI, Vit D; ?when her last BMD was done?    Anxiety> as noted- she has Alpraz for prn use but doesn't take it often...  ~  April 30, 2011:  37mo ROV & [name] has been  stable w/ her severe lung disease as noted; unfortunately she still self-adjusts her meds despite my admonition to take meds regularly as prescribed for max benefit; she tells me she has "6 trips" coming up & she wants her "magic shot" meaning DepoMedrol; we have on numerous occas discussed the importance of minimizing the cortisone Rx & maximizing her bronchdilators etc; plus we have implored her to decrease her aggressive travel schedule fearing that she will have a resp exacerbation (w/ acute resp failure) [place]... See prob list below>> LABS 3/13:  FLP- not at goal on Feno120;  Chems- ok x BS=124 A1c=6.7 on Metform monotherapy...  06/21/11  Acute OV  Complains of indigestion , heartburn for last 1 month. Burping /belching. Comes and goes.  Took expired protonix with help. Got new rx for Protonix x 1 week ago, taking Twice daily  .  Tried Prilosec-did not feel as good. Has a lot of Gas and bloating . Mild constipation with small stools .  No vomitting , bloody stools , no urinary symtpoms.  ?taking tums vs pepcid. 3-4 times daily .  No fever, exertional chest pain. No edema.  No calf pain or swelling.   Was seen by Oncology in March w/ follow up CT chest - no sign of disease progression.  Several Ground glass opaciites. Has upcoming CT for 6 months repeat.  >>referred to GI   07/05/2011 Acute OV  Complains over the last week increaesd dry cough and wheezing .  More DOE. No chest pain  Seen by GI , taken off NSAIDS and plans for endo next month .  Still has some abdominal pain and heartburn.  Requests steroid shot and taper so she can get thru the next 2 weeks with vacations "it is the only thing that works"  We discussed the side effects of recurrent steroid use, " I need it"  No fever or hemoptysis .  No edema.       Problem List:    << PROBLEM LIST UPDATED 04/30/11 >>  ASTHMA (ICD-493.90) / COPD (ICD-496) - ex-smoker, quit 1997... Prev on Advair500 now  DULERA200 2spBid (ch due to sore throat) & SPIRIVA 1/d + MUCINEX 2Bid & PROVENTIL Prn... she was participating in [facility] (last 3/09) & stopped on her own... may have a superimposed component of restriction due to obesity & prev lung surgeries... ~  11/05: s/p RULobectomy for lung ca- (adeno w/ bronchoalveolar cell features). ~  PFT 8/08 showed FVC=2.02 (77%), FEV1=1.07 (51%), %1sec=53, mid-flows=19%pred. ~  PFT 7/09 today= FVC=1.93 (72%), FEV1=1.04 (49%), %1sec=54, mid-flows=19%pred. ~  1/11:  s/p LUL resection by DrBurney> multicentric bronchoalveolar cell cancer. ~  PFT 8/12 showed FVC=2.25 (88%), FEV1=1.08 (56%), %1sec=48, mid-flows=30% pred. ~  10/12:  She reports Dulera caused sore mouth so she switched back to Advair500 but asked to do this Bid regularly.  Hx of LUNG CANCER (ICD-162.9) - Hx multicentric bronchoalveolar cell lung cancer >> followed by DrBurney for CVTS & DrMohammed for Oncology... ~  s/p right upper lobectomy by DrBurney 11/05 for a stage 1A non-small cell lung cancer (adenocarcinoma w/ bronchalveolar cell features); post-op observation only... ~  CT Chest 11/08 showed no recurrence, mult bilat nodules unchanged x3+ yrs, nodular thyroid w/o change... ~  CXR 7/09 showed stable post-op changes and scarring on the right, NAD.[name]. ~  CTAngio Chest 10/09 showed neg for PE, prom thyroid, atherosclerotic changes in Ao, no change in ground-glass nodules in LUL area... ~  CT Chest 11/10 by DrMohammed showed new LUL solid nodule, no change in ground-glass areas... lesion was PET pos... ~  1/11:  s/p LULobectomy & node dissection via minithoracotomy by DrBurney- path showed 3 foci of well diff bronchoalveolar cell ca & neg nodes... decision made at conference for no chemoRx, EGFR assay was neg... ~  she continues to have monitoring by DrBurney/ DrMohammed> on observation alone; last note from DrMohammed 9/12 indicated she was stable & he plans f/u CT Chest & OV in 8mo... ~  Last  CT Chest 9/12 showed stable 9mm ground glass opac LUL w/o change from 3/12 scan; +post op changes, COPD, coronary calcif, & hep steatosis...  HYPERTENSION (ICD-401.9) - on DIOVAN/Hct 160-12.5 Daily, VERAPAMIL SR 240mg /d, & LASIX 20mg  "Prn"... BP= 122/72 & feeling OK, tolerating Rx etc... denies CP, palipit, dizziness, syncope, ch in dyspnea, edema, etc...  ~  Cardiac eval 9/09 by Walker Kehr was neg and 2DEcho showed DD w/ norm LVsys function, EF= 60-65%, no regional wall motion abn...  PAROXYSMAL ATRIAL FIBRILLATION (ICD-427.31) - hx PAF in the post op period... converted to NSR & holding... transiently on Amiodarone & she wanted off Rx...  PERIPHERAL VASCULAR DISEASE (ICD-443.9) - she has atherosclerotic changes in her Ao noted on her prev scans...  ~  11/10: pt had mult questions about this problem on the prob list- discussed "hardening of the arteries" in detail.  VENOUS INSUFFICIENCY (ICD-459.81) - she knows to follow a low sodium diet, elevate legs, wear support hose, etc; she insists on keeping Lasix 20mg  on hand for Prn use...  HYPERLIPIDEMIA (ICD-272.4) - prev on Livolo 2mg - 1/2 tab daily (stopped due to aching), +FENOGLIDE 120mg /d, FISH OIL & CoQ10  supplements... prev on Vytorin but INTOL ==> she states INTOL to all statins... she was not satis w/ the Lipid Clinic in the past. ~  labs 8/08 off Vytorin showed TChol 183, TG 207, HDL 46, LDL 112... try fenofibrate... ~  FLP 5/09 on Feno120 showed TChol 188, TG 111, HDL 28, LDL 137... cont same, better diet, get wt down! ~  FLP 4/10 on Feno120 showed TChol 240, TG 104, HDL 49, LDL 165... I rec f/u Lipid Clinic, she declined. ~  FLP 7/10 on Feno120 showed TChol 222, TG 152, HDL 36, LDL 172... rec> try LIVALO 2mg - 1/2 tab Qhs, she stopped. ~  FLP 11/10 on Feno120+FishOil showed TChol 253, TG 125, HDL 42, LDL 208... try LIVALO2mg - 1/2 tab/d & stay on it. ~  FLP 1/11 on Liv1mg +Feno120 showed TChol 170, TG 101, HDL 45, LDL 105... continue same. ~   3/11: she reports aching all over & DrBurney stopped the Livolo> contin diet + other meds. ~  FLP 8/11 showed TChol 225, TG 238, HDL 41, LDL 156... INTOL all statins, she'll do the best she can w/ diet. ~  FLP 8/12 in hosp showed TChol 234, TG 276, HDL 52, LDL 127... Counseled on low chol, low fat, wt reducing diet. ~  FLP 3/13 on Feno120 showed TChol 221, TG 85, HDL 56, LDL 159... She refuses Statin Rx or Lipid Clinic referral...  DIABETES MELLITUS (ICD-250.00) - started on METFORMIN 500mg Bid 4/10, but decr on her own to 1 daily 1/11... we had stressed the importance of diet- low carb/ low fat and weight reduction, along w/ her pulm rehab exercises... ~  labs 4/10 showed BS= 131, HgA1c= 7.0.Marland KitchenMarland Kitchen Metformin500Bid started. ~  labs 7/10 showed BS= 134, A1c= 6.0 ~  labs 11/10 showed BS= 148, A1c= 6.5 ~  1/11:  she cut the Metformin to 1/d due to nausea. ~  labs 8/11 showed BS= 137, A1c= 5.7.Marland KitchenMarland Kitchen continue same, get wt down. ~  Labs 8/12 in hosp on Metform500/d showed BS= 110-230, A1c=6.5 ~  Labs 10/12 on Metform500/d showed BS= 141, A1c=7.0.Marland KitchenMarland Kitchen Needs better diet, get wt down, or more meds. ~  1/13:  Ophthalmology check up by DrMcCuen> neg- no diabetic retinopathy... ~  Labs 3/13 on Metform500/d showed BS= 124, A1c= 6.7  NONTOXIC MULTINODULAR GOITER (ICD-241.1) - eval and rx  by Sandra Cockayne for Endocrinology & DrGerkin for CCS; she is asymptomatic; dominant nodule was needled and benign; surg consult from DrGerkin- elected observation & he checks her yearly... ~  seen 6/10 by DrGerkin- f/u sonar w/o change in any of the nodules... ~  labs 7/10 showed TSH= 0.89 ~  seen 6/11 by DrGerkin- f/u sonar w/o change in nodules. ~  Labs 8/12 showed TSH= 0.047... Not on Thyroid meds, needs f/u DrBalan (she never went). ~  Labs 10/12 showed TSH= 0.97, FreeT3= 3.2 (2.3-4.2), FreeT4= 0.88 (0.60-1.60)  OBESITY (ICD-278.00) - obese w/ abd panniculus & we discussed diet + exercise strategies.[name]. ~  weight up to 205# 11/09- we  discussed diet, calorie restriction, exercise, & get the weight down... ~  weight 4/10 = 198# ~  weight 7/10 = 194# ~  weight 11/10 = 196# ~  weight 2/11 = 184# (post op) ~  weight 8/11 = 181# ~  weight 11/11 = 186#... she needs to do better! ~  Weight 8/12 = 186# ~  Weight 10/12 = 198#... What happened? ~  Weight 3/13 = 189#... Keep up the good work!  GERD (ICD-530.81) - on PRILOSEC 20mg /d... ~  colonoscopy 7/09 by Rodena Medin showed 4 sm polyps= tubular adenoma on bx... f/u planned 3 yrs...  NEPHROLITHIASIS, HX OF (ICD-V13.01)  Hx of BLADDER CANCER (ICD-188.9) - had hematuria in 2001 & referred to DrPeterson... 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 all subseq cystoscopies have been neg- no recurrence. ~  she reports f/u w/ DrPeterson yearly & doing satis by her report.  DEGENERATIVE JOINT DISEASE (ICD-715.90) - s/p rotator cuff repair in 1991... s/p left TKR from DrGioffre in 2002...  OSTEOPENIA (ICD-733.90) - on ACTONEL 150/mo, ca++, MVI, etc... ~  labs 8/11 showed Vit D level = 22... rec> start Vit D supplement extra 02-1998 u daily...  ANXIETY (ICD-300.00) - on XANAX 0.25mg  Prn... she has signif hx of claustrophobia, but denies that she has any anxiety...   Past Surgical History  Procedure Date  . S/p right rotator cuff repair 1991    Dr. Meade Maw  . S/p turbt 10/01    Dr. Vonita Moss  . S/p left tkr 2002    Dr. Darrelyn Hillock  . 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 S/p d&c for removal of endometrial polyp (benign) 12/10    GYN  . Replacement total knee     Left     Outpatient Encounter Prescriptions as of 07/05/2011  Medication Sig Dispense Refill  . ACTONEL 150 MG tablet TAKE 1 TABLET BY MOUTH EVERY MONTH.  1 tablet  3  . albuterol (PROVENTIL HFA)  108 (90 BASE) MCG/ACT inhaler Inhale 2 puffs into the lungs every 6 (six) hours as needed.        [name] aspirin 81 MG tablet Once a day       . Biotin 5000 MCG TABS Take 1 tablet by mouth daily.      . cetirizine (ZYRTEC) 10 MG tablet Take 10 mg by mouth as needed.       . Coenzyme Q10 (COQ10) 200 MG CAPS Take 1 capsule by mouth daily.      . Cyanocobalamin (VITAMIN B 12 PO) Take by mouth. 1000 mcg once a day       .  DIOVAN HCT 160-12.5 MG per tablet TAKE 1 TABLET BY MOUTH ONCE DAILY  30 tablet  10  . Famotidine (PEPCID PO) Take 6 tablets by mouth at bedtime.       [name] FENOGLIDE 120 MG TABS TAKE 1 TABLET BY MOUTH ONCE DAILY  30 tablet  0  . Fluticasone-Salmeterol (ADVAIR DISKUS) 500-50 MCG/DOSE AEPB Inhale 1 puff into the lungs 2 (two) times daily.  60 each  3  . furosemide (LASIX) 20 MG tablet take 1 tablet by mouth once daily if needed for SWELLING  30 tablet  PRN  . Glucosamine-Chondroit-Vit C-Mn (GLUCOSAMINE CHONDR 1500 COMPLX) CAPS Take 1 capsule by mouth daily.      . metFORMIN (GLUCOPHAGE) 500 MG tablet take 1 tablet by mouth every morning  30 tablet  6  . Multiple Vitamin (MULTIVITAMIN PO) Take by mouth. Once a day       . Omega-3 Fatty Acids (FISH OIL) 1200 MG CAPS Take by mouth. Once a day      . Probiotic Product (ALIGN PO) Take 1 tablet by mouth daily.      . Simethicone (GAS-X PO) Take by mouth as needed.       [name] SPIRIVA HANDIHALER 18 MCG inhalation capsule INHALE THE CONTENTS OF 1 CAPSULE VIA HANDIHALER ONCE DAILY.  30 capsule  5  . traMADol (ULTRAM) 50 MG tablet Take 1 tablet (50 mg total) by mouth 3 (three) times daily as needed.  90 tablet  5  . verapamil (CALAN-SR) 240 MG CR tablet TAKE 1 TABLET BY MOUTH ONCE DAILY  30 tablet  10  . Vitamin D, Ergocalciferol, (DRISDOL) 50000 UNITS CAPS take 1 capsule by mouth every week  4 capsule  12  . meloxicam (MOBIC) 7.5 MG tablet Take 7.5 mg by mouth as needed.      [name] MOVIPREP 100 G SOLR Moviprep  Take as directed.  1 kit  0  . pantoprazole  (PROTONIX) 40 MG tablet take 1 tablet by mouth twice a day  60 tablet  0    Allergies                                          Pt states INTOL to ALL STATINS...  Allergen Reactions  . Cephalexin     REACTION: nausea and diarrhea  . Codeine     REACTION: nausea  . Epinephrine     REACTION: nervous  . Erythromycin     REACTION: all mycins cause yeast infections  . Morphine     REACTION: nausea  . Sulfamethoxazole W/Trimethoprim     REACTION: nausea and diarrhea    Current Medications, Allergies, Past Medical History, Past Surgical History, Family History, and Social History were reviewed in [employer] record.    Review of Systems         Constitutional:   No  weight loss, night sweats,  Fevers, chills,  +fatigue, or  lassitude.  HEENT:   No headaches,  Difficulty swallowing,  Tooth/dental problems, or  Sore throat,                No sneezing, itching, ear ache, nasal congestion, post nasal drip,   CV:  No chest pain,  Orthopnea, PND, swelling in lower extremities, anasarca, dizziness, palpitations, syncope.   GI  + heartburn, indigestion, No  abdominal pain, nausea, vomiting, diarrhea,  loss of appetite,  bloody stools.   Resp: .  No excess mucus, no productive cough,    No coughing up of blood.  No change in color of mucus.    No chest wall deformity  Skin: no rash or lesions.  GU: no dysuria, change in color of urine, no urgency or frequency.  No flank pain, no hematuria   MS:  No joint pain or swelling.  No decreased range of motion.  No back pain.  Psych:  No change in mood or affect. No depression or anxiety.  No memory loss.       Objective:   Physical Exam     WD, WN, 77 y/o  WF in NAD, overweight  GENERAL:  Alert & oriented; pleasant & cooperative... HEENT:  [name],   EACs-clear, TMs-wnl, NOSE-clear, THROAT-clear & wnl. NECK:  Supple w/ fairROM; no JVD; normal carotid impulses w/o bruits; no thyromegaly or nodules palpated; no  lymphadenopathy. CHEST:  Decr BS bilat;  Clear except for upper airway psuedowheezing on forced expiration.  HEART:  Regular Rhythm; without murmurs/ rubs/ or gallops heard... ABDOMEN:  Obese w/ panniculus, soft & nontender; normal bowel sounds; no organomegaly or masses detected. EXT:  S/P left TKR; mild arthritic changes  DERM:  No lesions noted; no rash etc...     Assessment & Plan:

## 2011-07-10 ENCOUNTER — Telehealth: Payer: Self-pay | Admitting: Pulmonary Disease

## 2011-07-10 ENCOUNTER — Other Ambulatory Visit: Payer: Self-pay | Admitting: Pulmonary Disease

## 2011-07-10 NOTE — Telephone Encounter (Signed)
Spoke with pt and notified of lab results per [facility]. Pt verbalized understanding and denied any questions.

## 2011-07-10 NOTE — Progress Notes (Signed)
Quick Note:  Spoke with pt and notified of results per Dr.Parrett. Pt verbalized understanding and denied any questions. ______

## 2011-07-15 ENCOUNTER — Ambulatory Visit: Payer: Medicare Other | Admitting: Internal Medicine

## 2011-07-31 ENCOUNTER — Ambulatory Visit (AMBULATORY_SURGERY_CENTER): Payer: Medicare Other | Admitting: Internal Medicine

## 2011-07-31 ENCOUNTER — Encounter: Payer: Self-pay | Admitting: Internal Medicine

## 2011-07-31 VITALS — BP 133/58 | HR 67 | Temp 96.4°F | Resp 18 | Ht 62.0 in | Wt 189.0 lb

## 2011-07-31 DIAGNOSIS — K5520 Angiodysplasia of colon without hemorrhage: Secondary | ICD-10-CM | POA: Insufficient documentation

## 2011-07-31 DIAGNOSIS — R1013 Epigastric pain: Secondary | ICD-10-CM

## 2011-07-31 DIAGNOSIS — K2990 Gastroduodenitis, unspecified, without bleeding: Secondary | ICD-10-CM

## 2011-07-31 DIAGNOSIS — D131 Benign neoplasm of stomach: Secondary | ICD-10-CM

## 2011-07-31 DIAGNOSIS — Z860100 Personal history of colon polyps, unspecified: Secondary | ICD-10-CM

## 2011-07-31 DIAGNOSIS — R110 Nausea: Secondary | ICD-10-CM

## 2011-07-31 DIAGNOSIS — D126 Benign neoplasm of colon, unspecified: Secondary | ICD-10-CM

## 2011-07-31 DIAGNOSIS — K317 Polyp of stomach and duodenum: Secondary | ICD-10-CM

## 2011-07-31 DIAGNOSIS — Z860101 Personal history of adenomatous and serrated colon polyps: Secondary | ICD-10-CM

## 2011-07-31 DIAGNOSIS — Z1211 Encounter for screening for malignant neoplasm of colon: Secondary | ICD-10-CM

## 2011-07-31 DIAGNOSIS — K2970 Gastritis, unspecified, without bleeding: Secondary | ICD-10-CM

## 2011-07-31 LAB — GLUCOSE, CAPILLARY
Glucose-Capillary: 94 mg/dL (ref 70–99)
Glucose-Capillary: 95 mg/dL (ref 70–99)

## 2011-07-31 MED ORDER — SODIUM CHLORIDE 0.9 % IV SOLN: 500.0000 mL | INTRAVENOUS | Status: DC

## 2011-07-31 MED ORDER — OMEPRAZOLE 20 MG PO CPDR: 20.0000 mg | DELAYED_RELEASE_CAPSULE | Freq: Every day | ORAL | Status: DC

## 2011-07-31 NOTE — Op Note (Signed)
[facility] [address] [place], [place]  [address]  COLONOSCOPY PROCEDURE REPORT  PATIENT:  [name], [name]  MR#:  [id number] BIRTHDATE:  [birth year], 76 yrs. old  GENDER:  female ENDOSCOPIST:  Iva Boop, MD, [facility] REF. BY: PROCEDURE DATE:  07/31/2011 PROCEDURE:  Colonoscopy with snare polypectomy ASA CLASS:  Class III INDICATIONS:  surveillance and high-risk screening, history of pre-cancerous (adenomatous) colon polyps 4 diminutive adenomas removed in 2009 (index) MEDICATIONS:   There was residual sedation effect present from prior procedure., MAC sedation, administered by CRNA, propofol (Diprivan) 220 mg IV  DESCRIPTION OF PROCEDURE:   After the risks benefits and alternatives of the procedure were thoroughly explained, informed consent was obtained.  Digital rectal exam was performed and revealed no abnormalities.   The LB [id number]-[id number] endoscope was introduced through the anus and advanced to the cecum, which was identified by both the appendix and ileocecal valve, without limitations.  The quality of the prep was good, using MoviPrep. The instrument was then slowly withdrawn as the colon was fully examined. <<PROCEDUREIMAGES>>  FINDINGS:  There were multiple polyps identified and removed. throughout the colon. Twelve (12) polyps removed right, descending and sigmoid colon using cold snare. 10/12 recovered. All diminutive polyps max 5 mm. 3-4 mm AVM in cecum, not bleeding. Mild diverticulosis was found in the sigmoid colon.  This was otherwise a normal examination of the colon.   Retroflexed views in the rectum revealed internal hemorrhoids.    The time to cecum = 4:05 minutes. The scope was then withdrawn in 18:00 minutes from the cecum and the procedure completed. COMPLICATIONS:  None ENDOSCOPIC IMPRESSION: 1) Polyps, multiple throughout the colon 2) Mild diverticulosis in the sigmoid colon 3) Non-bleeding AVM in cecum 4) Internal hemorrhoids 5)  Otherwise normal examination  REPEAT EXAM:  In for Colonoscopy, pending biopsy results.  Iva Boop, MD, [facility]  CC:  The Patient  n. eSIGNED:   Iva Boop at 07/31/2011 03:09 PM  Irean Hong, [phone]

## 2011-07-31 NOTE — Patient Instructions (Addendum)
Please pick up the omeprazole prescription at your pharmacy. Take it every day. This should help you have less heartburn and reflux. You has 12 small polyps removed. I will send you a letter about the results and plans. Diverticulosis and hemorrhoids also seen - please read information provided. There was a small AVM in the colon also - blood vessels growing on surface of colon lining. Not usually a problem but sometimes they bleed. Iva Boop, MD, FACG    YOU HAD AN ENDOSCOPIC PROCEDURE TODAY AT THE [facility]: Refer to the procedure report that was given to you for any specific questions about what was found during the examination.  If the procedure report does not answer your questions, please call your gastroenterologist to clarify.  If you requested that your care partner not be given the details of your procedure findings, then the procedure report has been included in a sealed envelope for you to review at your convenience later.  YOU SHOULD EXPECT: Some feelings of bloating in the abdomen. Passage of more gas than usual.  Walking can help get rid of the air that was put into your GI tract during the procedure and reduce the bloating. If you had a lower endoscopy (such as a colonoscopy or flexible sigmoidoscopy) you may notice spotting of blood in your stool or on the toilet paper. If you underwent a bowel prep for your procedure, then you may not have a normal bowel movement for a few days.  DIET: Your first meal following the procedure should be a light meal and then it is ok to progress to your normal diet.  A half-sandwich or bowl of soup is an example of a good first meal.  Heavy or fried foods are harder to digest and may make you feel nauseous or bloated.  Likewise meals heavy in dairy and vegetables can cause extra gas to form and this can also increase the bloating.  Drink plenty of fluids but you should avoid alcoholic beverages for 24 hours.  ACTIVITY: Your care  partner should take you home directly after the procedure.  You should plan to take it easy, moving slowly for the rest of the day.  You can resume normal activity the day after the procedure however you should NOT DRIVE or use heavy machinery for 24 hours (because of the sedation medicines used during the test).    SYMPTOMS TO REPORT IMMEDIATELY: A gastroenterologist can be reached at any hour.  During normal business hours, 8:30 AM to 5:00 PM Monday through Friday, call [phone].  After hours and on weekends, please call the GI answering service at [phone] who will take a message and have the physician on call contact you.   Following lower endoscopy (colonoscopy or flexible sigmoidoscopy):  Excessive amounts of blood in the stool  Significant tenderness or worsening of abdominal pains  Swelling of the abdomen that is new, acute  Fever of 100F or higher  Following upper endoscopy (EGD)  Vomiting of blood or coffee ground material  New chest pain or pain under the shoulder blades  Painful or persistently difficult swallowing  New shortness of breath  Fever of 100F or higher  Black, tarry-looking stools  FOLLOW UP: If any biopsies were taken you will be contacted by phone or by letter within the next 1-3 weeks.  Call your gastroenterologist if you have not heard about the biopsies in 3 weeks.  Our staff will call the home number listed on your  records the next business day following your procedure to check on you and address any questions or concerns that you may have at that time regarding the information given to you following your procedure. This is a courtesy call and so if there is no answer at the home number and we have not heard from you through the emergency physician on call, we will assume that you have returned to your regular daily activities without incident.  SIGNATURES/CONFIDENTIALITY: You and/or your care partner have signed paperwork which will be entered  into your electronic medical record.  These signatures attest to the fact that that the information above on your After Visit Summary has been reviewed and is understood.  Full responsibility of the confidentiality of this discharge information lies with you and/or your care-partner.

## 2011-07-31 NOTE — Progress Notes (Signed)
Patient did not experience any of the following events: a burn prior to discharge; a fall within the facility; wrong site/side/patient/procedure/implant event; or a hospital transfer or hospital admission upon discharge from the facility. (G8907) Patient did not have preoperative order for IV antibiotic SSI prophylaxis. (G8918)  

## 2011-07-31 NOTE — Op Note (Signed)
[facility] [address] [place], [place]  [address]  ENDOSCOPY PROCEDURE REPORT  PATIENT:  [name], [name]  MR#:  [id number] BIRTHDATE:  [birth year], 76 yrs. old  GENDER:  female  ENDOSCOPIST:  Iva Boop, MD, [facility] Referred by:  PROCEDURE DATE:  07/31/2011 PROCEDURE:  EGD with biopsy, 19147 ASA CLASS:  Class III INDICATIONS:  epigastric pain, nausea, heartburn  MEDICATIONS:   MAC sedation, administered by CRNA, propofol (Diprivan) 100 mg IV TOPICAL ANESTHETIC:  none  DESCRIPTION OF PROCEDURE:   After the risks benefits and alternatives of the procedure were thoroughly explained, informed consent was obtained.  The LB GIF-H180 [id number] endoscope was introduced through the mouth and advanced to the second portion of the duodenum, without limitations.  The instrument was slowly withdrawn as the mucosa was fully examined. <<PROCEDUREIMAGES>>  There were multiple polyps identified. in the fundus. White, fleshy and sessile, maximum 1 cm dimension. Multiple biopsies were obtained and sent to pathology.  Moderate gastritis was found in the antrum. Erythema, mottling.  Otherwise the examination was normal.    Retroflexed views revealed Retroflexion exam demonstrated findings as previously described.    The scope was then withdrawn from the patient and the procedure completed.  COMPLICATIONS:  None  ENDOSCOPIC IMPRESSION: 1) Polyps, multiple in the fundus- biopsied 2) Moderate gastritis in the antrum 3) Otherwise normal examination RECOMMENDATIONS: 1) Await biopsy results 2) omeprazole 20 mg daily (Rx sent) 3) colonoscopy next  Iva Boop, MD, [facility]  CC:  The Patient  n. eSIGNED:   Iva Boop at 07/31/2011 03:02 PM  Irean Hong, [phone]

## 2011-08-01 ENCOUNTER — Telehealth: Payer: Self-pay | Admitting: *Deleted

## 2011-08-01 NOTE — Telephone Encounter (Signed)
  Follow up Call-  Call back number 07/31/2011  Post procedure Call Back phone  # [phone]  Permission to leave phone message Yes     Patient questions:  Do you have a fever, pain , or abdominal swelling? no Pain Score  0 *  Have you tolerated food without any problems? yes  Have you been able to return to your normal activities? yes  Do you have any questions about your discharge instructions: Diet   no Medications  no Follow up visit  no  Do you have questions or concerns about your Care? no  Actions: * If pain score is 4 or above: No action needed, pain <4. Pt states she is anxious to receive bx results due to her history and was explained we will either call or get a letter to her in 1-2 weeks. We will do this as quickly as possible

## 2011-08-07 ENCOUNTER — Encounter: Payer: Self-pay | Admitting: Internal Medicine

## 2011-08-07 DIAGNOSIS — K317 Polyp of stomach and duodenum: Secondary | ICD-10-CM | POA: Insufficient documentation

## 2011-08-07 NOTE — Progress Notes (Signed)
Quick Note:  Numerous small adenomas in colon Hyperplastic gastric polyps Colon and EGD recall 1 year ______

## 2011-08-12 ENCOUNTER — Telehealth: Payer: Self-pay | Admitting: Internal Medicine

## 2011-08-12 NOTE — Telephone Encounter (Signed)
Patient is scheduled for follow up per bx letter.  She also reports continued nasuea.  She will come in on 07/16/11 to see Dr Leone Payor

## 2011-08-15 ENCOUNTER — Ambulatory Visit (INDEPENDENT_AMBULATORY_CARE_PROVIDER_SITE_OTHER): Payer: Medicare Other | Admitting: Internal Medicine

## 2011-08-15 ENCOUNTER — Other Ambulatory Visit (INDEPENDENT_AMBULATORY_CARE_PROVIDER_SITE_OTHER): Payer: Medicare Other

## 2011-08-15 ENCOUNTER — Encounter: Payer: Self-pay | Admitting: Internal Medicine

## 2011-08-15 VITALS — BP 142/70 | HR 88 | Ht 62.0 in | Wt 186.4 lb

## 2011-08-15 DIAGNOSIS — R110 Nausea: Secondary | ICD-10-CM

## 2011-08-15 DIAGNOSIS — E041 Nontoxic single thyroid nodule: Secondary | ICD-10-CM

## 2011-08-15 DIAGNOSIS — K219 Gastro-esophageal reflux disease without esophagitis: Secondary | ICD-10-CM

## 2011-08-15 LAB — TSH: TSH: 0.68 u[IU]/mL (ref 0.35–5.50)

## 2011-08-15 LAB — T3, FREE: T3, Free: 2.9 pg/mL (ref 2.3–4.2)

## 2011-08-15 LAB — T4, FREE: Free T4: 0.8 ng/dL (ref 0.60–1.60)

## 2011-08-15 MED ORDER — SUCRALFATE 1 G PO TABS: 1.0000 g | ORAL_TABLET | Freq: Four times a day (QID) | ORAL | Status: DC

## 2011-08-15 NOTE — Patient Instructions (Addendum)
We have sent the following medications to your pharmacy for you to pick up at your convenience: Carafate  Your physician has requested that you go to the basement for the following lab work before leaving today: TSH, Feee T3, Free T4  Stop your omeprazole per Dr. Leone Payor.  Call us back in a week and update [place] on your symptom's.  Thank you for choosing [facility] today.

## 2011-08-19 NOTE — Progress Notes (Signed)
Quick Note:  Let her know thyroid tests are ok ______

## 2011-08-19 NOTE — Progress Notes (Signed)
  Subjective:    Patient ID: DANIALLE DEMENT, female    DOB: 28-Oct-1934, 77 y.o.   MRN: [id number]  HPI Patient returns for followup after she underwent an EGD and a colonoscopy. The EGD was performed because of increasing reflux symptoms despite PPI. Colonoscopy was to follow up history of colon polyps.  She had hyperplastic gastric polyps and numerous adenomas in the colon. I anticipate a routine repeat upper endoscopy and colonoscopy in 1 year.  Despite proton pump inhibitor therapy she continues to have indigestion and nausea and cold sweats. She stripe twice daily Prilosec, without relief. Gaviscon gives her a little relief, Pepcid complete has been no help. She has her bed elevated she is on a reflux diet. However she has bloating still. She does not really describe early satiety. She will try ondansetron which she's had left over from her cancer therapy and that does provide some relief.  She notes that the MoviPrep cause quite a bit of diarrhea and she would not like to use that prep again in the future.  Medications, allergies, past medical history, past surgical history, family history and social history are reviewed and updated in the EMR.    Review of Systems As per history of present illness    Objective:   Physical Exam Well-developed well-nourished no acute distress       Assessment & Plan:   1. GERD (gastroesophageal reflux disease)   2. Nausea   I'm not sure where all the symptoms are coming from. There does not seem to be a cause found of endoscopy. Question if she has some thyroid abnormalities given her nodules. She said some abnormalities of thyroid tests in the past but not recently. She may need a gastric emptying study. She has lung cancer, there are some nodules seen and being followed, question if she could have some sort of paraneoplastic syndrome flaring. Treat with Carafate, stop omeprazole and it has not helped, and she will call is in a week to see if she  is improved or not. Further plans pending the response to that treatment.   3. Thyroid nodule   Recheck TFTs given this problem, question some link   CC: Si Gaul, MD

## 2011-08-22 ENCOUNTER — Telehealth: Payer: Self-pay | Admitting: Internal Medicine

## 2011-08-22 NOTE — Telephone Encounter (Signed)
Patient reports minimal improvement in her symptoms.  She reports continued nausea, constipation, rectal discharge, and her urine has a very foul odor( rectal discharge and urine odor new since starting on carafate).  She denies urinary frequency or burning.  She does report nausea is a 5 on a scale of 1-10.  She has raised the head of her bed and is sleeping on wedge.  She wants me to call her back tomorrow after 2 at  [phone]

## 2011-08-27 ENCOUNTER — Telehealth: Payer: Self-pay | Admitting: Internal Medicine

## 2011-08-27 NOTE — Telephone Encounter (Signed)
She may quit carafate

## 2011-08-27 NOTE — Telephone Encounter (Signed)
Spoke with Tiffany to schedule. She will need to check with Nuc med to discuss 4 hour GES and will call tomorrow AM with appointment. Spoke with patient and she is aware we are waiting on this.Patient will be gone August 1 for 10 days.

## 2011-08-27 NOTE — Telephone Encounter (Signed)
Patient states she called last week with update and was never called back about what to do. She reports she felt better the first 3 days she took Carafate, then she started feeling bad again. States she has severe reflux that keeps her up at night. She is using a wedge and elevating HOB. Nausea is not as bad as it was but still has it. She has tried Gaviscon and Pepcid AC to see if these would help. Gaviscon helped the most but she stopped it because it was mint flavored. Reports she had diarrhea several hours last night but has not had any today. Denies fever. States she has a small amount of rectal discharge and foul smelling urine since starting the Carafate. Please, advise

## 2011-08-27 NOTE — Telephone Encounter (Signed)
Sorry - message I received was that she would call back.  I need her to have a gastric emptying study - ask for 4 hour study please re: reflux, regurgitation and nausea

## 2011-08-28 NOTE — Telephone Encounter (Signed)
Patient does not want to schedule GES at this point. She wants to try Carafate for awhile longer. She thinks she had food poisoning and that is what caused her problems. She wants to try medication first. States she is having company this weekend and is going to [employer] next week. She wants to give the medication more time.

## 2011-09-03 NOTE — Telephone Encounter (Signed)
OK 

## 2011-09-04 ENCOUNTER — Other Ambulatory Visit: Payer: Self-pay | Admitting: Pulmonary Disease

## 2011-09-19 ENCOUNTER — Telehealth: Payer: Self-pay | Admitting: Internal Medicine

## 2011-09-19 NOTE — Telephone Encounter (Signed)
Patient reports nausea and GERD.  She continues to decline GES.  She would like to discuss her symptoms with Dr. Leone Payor prior to ordering a GES.  She is concerned about being in an enclosed scanner.  I did call and speak with NM at [facility] she would need to stand in front of the scanner, so it is open.  NM reports that they will not do a 4 hour GES.  They said they did not have the protocol nor the scanner time.  Dr. Leone Payor they have said that if you do want the 4 hour GES you will need to discuss with Dr. Ty Hilts of radiology.  She will increase her omeprazole to BID until her appt with you on 10/14/11.

## 2011-09-25 MED ORDER — OMEPRAZOLE 20 MG PO CPDR: 20.0000 mg | DELAYED_RELEASE_CAPSULE | Freq: Two times a day (BID) | ORAL | Status: DC

## 2011-09-25 MED ORDER — METOCLOPRAMIDE HCL 5 MG PO TABS: 5.0000 mg | ORAL_TABLET | Freq: Three times a day (TID) | ORAL | Status: DC

## 2011-09-25 NOTE — Telephone Encounter (Signed)
I spoke to her Plan is to add metaclopramide 5 mg ac/hs Side effects discussed and advised to stop if gets them  Stay on bid Prilosec  Stay off carafate  Prn Gavsicon and Pepcid ok  She prefers to avoid a gastric emptying study

## 2011-09-30 ENCOUNTER — Telehealth: Payer: Self-pay | Admitting: Internal Medicine

## 2011-09-30 NOTE — Telephone Encounter (Signed)
Patient would like to try a probiotic to see if this will help her symptoms.  She is advised that she can try Align or [employer].  She will keep her office follow up on 10/14/11.  I have provided her samples of both probiotics.

## 2011-10-09 ENCOUNTER — Other Ambulatory Visit: Payer: Self-pay | Admitting: Pulmonary Disease

## 2011-10-11 ENCOUNTER — Other Ambulatory Visit: Payer: Self-pay | Admitting: *Deleted

## 2011-10-11 ENCOUNTER — Telehealth: Payer: Self-pay | Admitting: *Deleted

## 2011-10-11 NOTE — Telephone Encounter (Signed)
Added on lab appointment for 10-31-2011 at 9:30am

## 2011-10-14 ENCOUNTER — Encounter (INDEPENDENT_AMBULATORY_CARE_PROVIDER_SITE_OTHER): Payer: Medicare Other | Admitting: Internal Medicine

## 2011-10-14 ENCOUNTER — Ambulatory Visit (INDEPENDENT_AMBULATORY_CARE_PROVIDER_SITE_OTHER): Payer: Medicare Other | Admitting: Internal Medicine

## 2011-10-14 ENCOUNTER — Ambulatory Visit (INDEPENDENT_AMBULATORY_CARE_PROVIDER_SITE_OTHER): Payer: Medicare Other | Admitting: Nurse Practitioner

## 2011-10-14 ENCOUNTER — Encounter: Payer: Self-pay | Admitting: Internal Medicine

## 2011-10-14 ENCOUNTER — Encounter: Payer: Self-pay | Admitting: Nurse Practitioner

## 2011-10-14 VITALS — BP 110/60 | HR 111 | Ht 61.0 in | Wt 181.1 lb

## 2011-10-14 VITALS — BP 114/64 | HR 145 | Resp 20 | Ht 61.0 in | Wt 181.0 lb

## 2011-10-14 DIAGNOSIS — I4891 Unspecified atrial fibrillation: Secondary | ICD-10-CM

## 2011-10-14 DIAGNOSIS — K3189 Other diseases of stomach and duodenum: Secondary | ICD-10-CM

## 2011-10-14 DIAGNOSIS — R1013 Epigastric pain: Secondary | ICD-10-CM

## 2011-10-14 MED ORDER — APIXABAN 5 MG PO TABS: 5.0000 mg | ORAL_TABLET | Freq: Two times a day (BID) | ORAL | Status: DC

## 2011-10-14 MED ORDER — METOPROLOL TARTRATE 25 MG PO TABS: 25.0000 mg | ORAL_TABLET | Freq: Two times a day (BID) | ORAL | Status: DC

## 2011-10-14 MED ORDER — VERAPAMIL HCL ER 360 MG PO CP24: 360.0000 mg | ORAL_CAPSULE | Freq: Every day | ORAL | Status: DC

## 2011-10-14 NOTE — Progress Notes (Addendum)
[name] Date of Birth: Aug 24, 1934 Medical Record #[id number]  History of Present Illness: [name] is seen today at the request of Dr. Leone Payor. She is subsequently seen with Dr. Johney Frame. She has seen Dr. Eden Emms in the past. She had decided to start an exercise program. Her first day was today. Heart rate was quite elevated with just minimal exercise on the bike and treadmill. She was seeing Dr. Leone Payor later today for a regular follow up visit. She has had several months of reflux that is not controlled. Has had EGD and colonoscopy back in June. Had some polyps but overall reports it was ok. No bleeding history. She was found to be in atrial fib. She is not really symptomatic. Duration is unknown. She has been more fatigued over the last several days. Denies feeling her heart racing, skipping, etc. No chest pain. May be a little bit more short of breath but she says she is always short of breath. She has had lung cancer x 2. Sees Oncology next month with repeat CT scans. No syncope. Does have some occasional dizziness if she bends over. She is more concerned about her reflux symptoms.   Her other medical problems include HTN, DM, obesity, lung cancer, allergies and reflux.   Current Outpatient Prescriptions on File Prior to Visit  Medication Sig Dispense Refill  . ACTONEL 150 MG tablet TAKE 1 TABLET BY MOUTH EVERY MONTH.  1 tablet  3  . albuterol (PROVENTIL HFA) 108 (90 BASE) MCG/ACT inhaler Inhale 2 puffs into the lungs every 6 (six) hours as needed.        [name] aspirin 81 MG tablet Once a day       . Biotin 5000 MCG TABS Take 1 tablet by mouth daily.      . cetirizine (ZYRTEC) 10 MG tablet Take 10 mg by mouth as needed.       . Coenzyme Q10 (COQ10) 200 MG CAPS Take 1 capsule by mouth daily.      . Cyanocobalamin (VITAMIN B 12 PO) Take by mouth. 1000 mcg once a day       . DIOVAN HCT 160-12.5 MG per tablet TAKE 1 TABLET BY MOUTH ONCE DAILY  30 tablet  10  . Famotidine (PEPCID PO) Take by mouth  as needed.       [name] FENOGLIDE 120 MG TABS TAKE 1 TABLET BY MOUTH ONCE DAILY  30 tablet  0  . Fluticasone-Salmeterol (ADVAIR DISKUS) 500-50 MCG/DOSE AEPB Inhale 1 puff into the lungs 2 (two) times daily.  60 each  3  . furosemide (LASIX) 20 MG tablet take 1 tablet by mouth once daily if needed for SWELLING  30 tablet  PRN  . Glucosamine-Chondroit-Vit C-Mn (GLUCOSAMINE CHONDR 1500 COMPLX) CAPS Take 1 capsule by mouth daily.      . metFORMIN (GLUCOPHAGE) 500 MG tablet take 1 tablet by mouth every morning  30 tablet  6  . Multiple Vitamin (MULTIVITAMIN PO) Take by mouth. Once a day       . Omega-3 Fatty Acids (FISH OIL) 1200 MG CAPS Take by mouth. Once a day      . omeprazole (PRILOSEC) 20 MG capsule Take 1 capsule (20 mg total) by mouth 2 (two) times daily.      . ondansetron (ZOFRAN-ODT) 4 MG disintegrating tablet Take 4 mg by mouth every 8 (eight) hours as needed.      . Probiotic Product (ALIGN PO) Take 1 tablet by mouth daily.      [name]  SPIRIVA HANDIHALER 18 MCG inhalation capsule INHALE THE CONTENTS OF 1 CAPSULE VIA HANDIHALER ONCE DAILY.  30 capsule  5  . traMADol (ULTRAM) 50 MG tablet Take 1 tablet (50 mg total) by mouth 3 (three) times daily as needed.  90 tablet  5  . Vitamin D, Ergocalciferol, (DRISDOL) 50000 UNITS CAPS take 1 capsule by mouth every week  4 capsule  12  . DISCONTD: verapamil (CALAN-SR) 240 MG CR tablet TAKE 1 TABLET BY MOUTH ONCE DAILY  30 tablet  10  . apixaban (ELIQUIS) 5 MG TABS tablet Take 1 tablet (5 mg total) by mouth 2 (two) times daily.  60 tablet  3  . metoprolol tartrate (LOPRESSOR) 25 MG tablet Take 1 tablet (25 mg total) by mouth 2 (two) times daily.  60 tablet  11   Current Facility-Administered Medications on File Prior to Visit  Medication Dose Route Frequency Provider Last Rate Last Dose  . 0.9 %  sodium chloride infusion  500 mL Intravenous Continuous Iva Boop, MD        Allergies  Allergen Reactions  . Cephalexin     REACTION: nausea and diarrhea    . Codeine     REACTION: nausea  . Epinephrine     REACTION: nervous, convulsions  . Erythromycin     REACTION: all mycins cause yeast infections  . Morphine     REACTION: nausea  . Sulfamethoxazole W-Trimethoprim     REACTION: nausea and diarrhea    Past Medical History  Diagnosis Date  . Asthma   . COPD (chronic obstructive pulmonary disease)   . History of lung cancer   . Hypertension   . Paroxysmal atrial fibrillation   . Peripheral vascular disease   . Venous insufficiency   . Hyperlipidemia   . Diabetes mellitus   . Nontoxic multinodular goiter   . Obesity   . GERD (gastroesophageal reflux disease)   . History of nephrolithiasis   . History of bladder cancer   . DJD (degenerative joint disease)   . Osteopenia   . Anxiety   . History of colon polyps 07/233/2009    Tubular Adenomas  . Diverticulosis of sigmoid colon     mild  . Gastritis     moderate  . Hemorrhoids, internal   . Claustrophobia   . Thyroid nodule     Past Surgical History  Procedure Date  . S/p right rotator cuff repair 1991    Dr. Meade Maw, right  . S/p turbt 10/01    Dr. Vonita Moss  . S/p left tkr 2002    Dr. Darrelyn Hillock  . 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 S/p d&c for removal of endometrial polyp (benign) 12/10    GYN  . Colonoscopy   . Esophagogastroduodenoscopy     History  Smoking status  . Former Smoker  . Quit date: 02/04/1994  Smokeless tobacco  . Never Used    History  Alcohol Use  . Yes    Occasionally    Family History  Problem Relation Age of Onset  . Asthma Mother   . Heart failure Mother   . Heart attack Father   . Ulcers Father   . Colon cancer Neg Hx     Review of Systems: The review of systems is per the HPI.  All other  systems were reviewed and are negative.  Physical Exam: BP 114/64   Pulse 145  Resp 20  Ht 5\' 1"  (1.549 m)  Wt 181 lb (82.101 kg)  BMI 34.20 kg/m2 Patient is very pleasant and in no acute distress. She is obese. Little anxious. Skin is warm and dry. Color is normal.  HEENT is unremarkable. Normocephalic/atraumatic. PERRL. Sclera are nonicteric. Neck is supple. No masses. No JVD. Lungs are clear. Cardiac exam shows an irregular rate and rhythm. She is tachycardic.  Abdomen is soft. Extremities are without edema. Gait and ROM are intact. No gross neurologic deficits noted.  LABORATORY DATA:   EKG shows atrial fib with RVR. Rate is 145. Tracing is reviewed with Dr. Johney Frame.   BMET and CBC are pending  TSH, T3 and T4 from July were normal.   Remote echo from 2009 showed normal EF.    Assessment / Plan:  Atrial fib with RVR - she is subsequently seen with Dr. Johney Frame. She is not felt to be symptomatic. Her CHADs is at least a 3 due to her age, HTN and DM. We are increasing her Verapamil to 360 mg a day. Adding Lopressor 25 mg BID. Adding Eliquis 5 mg BID for anticoagulation. Stop aspirin on Wednesday. Will arrange for an echo. See her back on Wednesday to reassess her rate control. She is to go to the ER if she has symptoms/problems in the interim. She is felt to be a candidate for cardioversion. She is not to attend her exercise program for now. She does report daily alcohol use and I have asked her to abstain. No driving til we get her rate under control as well. Further disposition is to follow.   I have seen, examined the patient, and reviewed the above assessment and plan.  Changes to above are made where necessary.  Pt presents with afib and RVR.  She appears to be minimally symptomatic.  I suspect that this is a chronic concern.  Given elevated CHADS2 score, we will initiate anticoagulation with eliquis.  In addition, we will obtain an echo to evaluate for structural heart changes and begin attempts at aggressive rate control.  She will return to see Lawson Fiscal in  2-3 days. Once she has been adequately anticoagulation for 3 weeks, I think that cardioversion would be advised.  We can then better assess her symptoms based on clinical improvement with sinus rhythm.  Co Sign: Hillis Range, MD 10/14/2011 8:33 PM

## 2011-10-14 NOTE — Patient Instructions (Addendum)
You are in atrial fibrillation. We need to control your heart rate and prevent a stroke  We are going to increase the Verapamil to 360 mg a day. This is to help slow your heart down  We are adding Lopressor 25 mg twice a day. This is to help slow your heart rate down.   We need to check some labs today  We are going to arrange for an ultrasound of your heart.   We are adding Eliquis 5 mg two times a day to prevent stroke.   Stop your aspirin on Wednesday.  No alcohol.  I will see you on Wednesday.  Call the [facility] office at [phone] if you have any questions, problems or concerns.

## 2011-10-14 NOTE — Addendum Note (Signed)
Addended by: Hillis Range on: 10/14/2011 08:35 PM   Modules accepted: Level of Service

## 2011-10-14 NOTE — Progress Notes (Signed)
  Subjective:    Patient ID: [name], female    DOB: January 31, 1935, 77 y.o.   MRN: [id number]  HPI This elderly white woman presents for followup of dyspepsia. She has had intermittent problems with nausea and abdominal discomfort and heartburn. She is on twice a day PPI. I believe a trial of Carafate was unhelpful. She has periods where she feels well and then periods where she does not feel well. Last week he had no problems with eating and no nausea, she almost cancel this appointment. For the last 36 hours she's had a lot of nausea and feels that way whether she eats or not. Today she started an exercise program and was told her heart rate was 200 after exercising slightly on 2 different machines. Her pulse rate is 111 her the medical assistance vitals. She says she just feels tired and fatigue today. There is no chest pain. She has chronic dyspnea on exertion and is unchanged or just mildly worse. Medications, allergies, past medical history, past surgical history, family history and social history are reviewed and updated in the EMR.  Review of Systems As above. She is due for a CT scan of the chest to followup her lung cancer later this month. If she is hoping to go to the [facility] this Sunday.    Objective:   Physical Exam General:  Mildly ill Eyes:   anicteric Lungs:  clear Heart:  S1S2 irregularly irregular with apical rate 128. no rubs, murmurs or gallops Abdomen:  soft and obese, mildly tender LUQ, BS+ Ext:   no edema       Assessment & Plan:   1. Atrial fibrillation with rapid ventricular response - mild rate elevation with mild symptoms.   2. Dyspepsia    1. Though she does not recall the age of fibrillation is not new. There is a history of paroxysmal atrial fibrillation on the chart. She had seen Dr. Eden Emms in the past, many years ago. 2. When asked her to stop her Actonel given her dyspepsia. 3. I will followup with her regarding her dyspepsia after her cardiac  evaluation, I have spoken to the cardiology doctor the day, Dr. Johney Frame and the patient will be seen in cardiology today, we discussed the case and decided to have her seen in the clinic.. I have asked her to call her husband the driver over there and she drove herself to the clinic today.

## 2011-10-14 NOTE — Patient Instructions (Addendum)
Please hold your Actonel which may be causing some of your problems.  Go from here over to see the Dr. Rennis Petty the day, Dr. Hillis Range at [facility].  Dr. Leone Payor has already spoken to them and they are expecting you.  Thank you for choosing me and [facility].  Iva Boop, M.D., [facility]

## 2011-10-15 ENCOUNTER — Ambulatory Visit (HOSPITAL_COMMUNITY): Payer: Medicare Other | Attending: Cardiovascular Disease

## 2011-10-15 ENCOUNTER — Other Ambulatory Visit: Payer: Self-pay

## 2011-10-15 DIAGNOSIS — I369 Nonrheumatic tricuspid valve disorder, unspecified: Secondary | ICD-10-CM | POA: Insufficient documentation

## 2011-10-15 DIAGNOSIS — I4891 Unspecified atrial fibrillation: Secondary | ICD-10-CM | POA: Insufficient documentation

## 2011-10-15 DIAGNOSIS — I059 Rheumatic mitral valve disease, unspecified: Secondary | ICD-10-CM | POA: Insufficient documentation

## 2011-10-15 DIAGNOSIS — J449 Chronic obstructive pulmonary disease, unspecified: Secondary | ICD-10-CM | POA: Insufficient documentation

## 2011-10-15 DIAGNOSIS — E119 Type 2 diabetes mellitus without complications: Secondary | ICD-10-CM | POA: Insufficient documentation

## 2011-10-15 DIAGNOSIS — J4489 Other specified chronic obstructive pulmonary disease: Secondary | ICD-10-CM | POA: Insufficient documentation

## 2011-10-15 DIAGNOSIS — I379 Nonrheumatic pulmonary valve disorder, unspecified: Secondary | ICD-10-CM | POA: Insufficient documentation

## 2011-10-15 DIAGNOSIS — I10 Essential (primary) hypertension: Secondary | ICD-10-CM | POA: Insufficient documentation

## 2011-10-15 DIAGNOSIS — I319 Disease of pericardium, unspecified: Secondary | ICD-10-CM | POA: Insufficient documentation

## 2011-10-15 LAB — CBC WITH DIFFERENTIAL/PLATELET
Basophils Absolute: 0 10*3/uL (ref 0.0–0.1)
Basophils Relative: 0.2 % (ref 0.0–3.0)
Eosinophils Absolute: 0.1 10*3/uL (ref 0.0–0.7)
Eosinophils Relative: 1.2 % (ref 0.0–5.0)
HCT: 47 % — ABNORMAL HIGH (ref 36.0–46.0)
Hemoglobin: 15.6 g/dL — ABNORMAL HIGH (ref 12.0–15.0)
Lymphocytes Relative: 23.9 % (ref 12.0–46.0)
Lymphs Abs: 2.6 10*3/uL (ref 0.7–4.0)
MCHC: 33.1 g/dL (ref 30.0–36.0)
MCV: 95.9 fl (ref 78.0–100.0)
Monocytes Absolute: 0.6 10*3/uL (ref 0.1–1.0)
Monocytes Relative: 5.5 % (ref 3.0–12.0)
Neutro Abs: 7.5 10*3/uL (ref 1.4–7.7)
Neutrophils Relative %: 69.2 % (ref 43.0–77.0)
Platelets: 254 10*3/uL (ref 150.0–400.0)
RBC: 4.9 Mil/uL (ref 3.87–5.11)
RDW: 13.2 % (ref 11.5–14.6)
WBC: 10.8 10*3/uL — ABNORMAL HIGH (ref 4.5–10.5)

## 2011-10-15 LAB — BASIC METABOLIC PANEL
BUN: 23 mg/dL (ref 6–23)
CO2: 26 mEq/L (ref 19–32)
Calcium: 10 mg/dL (ref 8.4–10.5)
Chloride: 106 mEq/L (ref 96–112)
Creatinine, Ser: 1 mg/dL (ref 0.4–1.2)
GFR: 58.81 mL/min — ABNORMAL LOW (ref >60.00)
Glucose, Bld: 92 mg/dL (ref 70–99)
Potassium: 4.2 mEq/L (ref 3.5–5.1)
Sodium: 142 mEq/L (ref 135–145)

## 2011-10-15 NOTE — Progress Notes (Signed)
Echocardiogram performed.  

## 2011-10-15 NOTE — Progress Notes (Signed)
   Patient ID: [name], female    DOB: 1934-03-25, 77 y.o.   MRN: [id number]  HPI    Review of Systems    Physical Exam

## 2011-10-16 ENCOUNTER — Ambulatory Visit (INDEPENDENT_AMBULATORY_CARE_PROVIDER_SITE_OTHER): Payer: Medicare Other | Admitting: Nurse Practitioner

## 2011-10-16 ENCOUNTER — Encounter: Payer: Self-pay | Admitting: Nurse Practitioner

## 2011-10-16 VITALS — BP 118/58 | HR 56 | Ht 61.0 in | Wt 181.8 lb

## 2011-10-16 DIAGNOSIS — I480 Paroxysmal atrial fibrillation: Secondary | ICD-10-CM

## 2011-10-16 DIAGNOSIS — I4891 Unspecified atrial fibrillation: Secondary | ICD-10-CM

## 2011-10-16 MED ORDER — PANTOPRAZOLE SODIUM 40 MG PO TBEC: 40.0000 mg | DELAYED_RELEASE_TABLET | Freq: Every day | ORAL | Status: DC

## 2011-10-16 NOTE — Progress Notes (Signed)
[name] Date of Birth: 08-26-1934 Medical Record #[id number]  History of Present Illness: [name] is seen today for a 2 day check. She is seen for Dr. Johney Frame. She has had PAF and was seen earlier this week with recurrent atrial fib with RVR. She was placed on Lopressor and her Verapamil was increased. She was started on Eliquis. Her CHADs is at least a 38 (age, HTN, DM). An echo was done yesterday showing good LV function. She has moderate hypertrophy and mild LAE and MR. She was noted to be back in sinus when she was here yesterday for the echo.   She comes in today. She is here alone. She feels the same. Not aware of her rhythm. Complaining more of her GI issues. Needs to get her visit with Dr. Leone Payor rescheduled. She stopped the Prilosec and wants to go back to Protonix. I think that is ok. Still with some shortness of breath. Hard to say if it is worse. She has had lung cancer x 2 with surgical interventions. She is to see Dr. Shirline Frees next month with repeat scans. She is not dizzy. Has fatigue. She notes that she feels just the same as when she was here earlier in the week. She does note a deep cough but it is not productive. She has had no fever or chills.   Current Outpatient Prescriptions on File Prior to Visit  Medication Sig Dispense Refill  . albuterol (PROVENTIL HFA) 108 (90 BASE) MCG/ACT inhaler Inhale 2 puffs into the lungs every 6 (six) hours as needed.        [name] apixaban (ELIQUIS) 5 MG TABS tablet Take 1 tablet (5 mg total) by mouth 2 (two) times daily.  60 tablet  3  . Biotin 5000 MCG TABS Take 1 tablet by mouth daily.      . cetirizine (ZYRTEC) 10 MG tablet Take 10 mg by mouth as needed.       . Coenzyme Q10 (COQ10) 200 MG CAPS Take 1 capsule by mouth daily.      . Cyanocobalamin (VITAMIN B 12 PO) Take by mouth. 1000 mcg once a day       . DIOVAN HCT 160-12.5 MG per tablet TAKE 1 TABLET BY MOUTH ONCE DAILY  30 tablet  10  . FENOGLIDE 120 MG TABS TAKE 1 TABLET BY MOUTH ONCE  DAILY  30 tablet  0  . Fluticasone-Salmeterol (ADVAIR DISKUS) 500-50 MCG/DOSE AEPB Inhale 1 puff into the lungs 2 (two) times daily.  60 each  3  . furosemide (LASIX) 20 MG tablet take 1 tablet by mouth once daily if needed for SWELLING  30 tablet  PRN  . Glucosamine-Chondroit-Vit C-Mn (GLUCOSAMINE CHONDR 1500 COMPLX) CAPS Take 1 capsule by mouth daily.      . metFORMIN (GLUCOPHAGE) 500 MG tablet take 1 tablet by mouth every morning  30 tablet  6  . metoprolol tartrate (LOPRESSOR) 25 MG tablet Take 1 tablet (25 mg total) by mouth 2 (two) times daily.  60 tablet  11  . Multiple Vitamin (MULTIVITAMIN PO) Take by mouth. Once a day       . Omega-3 Fatty Acids (FISH OIL) 1200 MG CAPS Take by mouth. Once a day      . ondansetron (ZOFRAN-ODT) 4 MG disintegrating tablet Take 4 mg by mouth every 8 (eight) hours as needed.      . Probiotic Product (ALIGN PO) Take 1 tablet by mouth daily.      . traMADol Janean Sark)  50 MG tablet Take 1 tablet (50 mg total) by mouth 3 (three) times daily as needed.  90 tablet  5  . verapamil (VERELAN PM) 360 MG 24 hr capsule Take 1 capsule (360 mg total) by mouth at bedtime.  30 capsule  6  . Vitamin D, Ergocalciferol, (DRISDOL) 50000 UNITS CAPS take 1 capsule by mouth every week  4 capsule  12  . SPIRIVA HANDIHALER 18 MCG inhalation capsule INHALE THE CONTENTS OF 1 CAPSULE VIA HANDIHALER ONCE DAILY.  30 capsule  5   Current Facility-Administered Medications on File Prior to Visit  Medication Dose Route Frequency Provider Last Rate Last Dose  . 0.9 %  sodium chloride infusion  500 mL Intravenous Continuous Iva Boop, MD        Allergies  Allergen Reactions  . Cephalexin     REACTION: nausea and diarrhea  . Codeine     REACTION: nausea  . Epinephrine     REACTION: nervous, convulsions  . Erythromycin     REACTION: all mycins cause yeast infections  . Morphine     REACTION: nausea  . Sulfamethoxazole W-Trimethoprim     REACTION: nausea and diarrhea    Past  Medical History  Diagnosis Date  . Asthma   . COPD (chronic obstructive pulmonary disease)   . History of lung cancer   . Hypertension   . Paroxysmal atrial fibrillation   . Peripheral vascular disease   . Venous insufficiency   . Hyperlipidemia   . Diabetes mellitus   . Nontoxic multinodular goiter   . Obesity   . GERD (gastroesophageal reflux disease)   . History of nephrolithiasis   . History of bladder cancer   . DJD (degenerative joint disease)   . Osteopenia   . Anxiety   . History of colon polyps 07/233/2009    Tubular Adenomas  . Diverticulosis of sigmoid colon     mild  . Gastritis     moderate  . Hemorrhoids, internal   . Claustrophobia   . Thyroid nodule   . Chronic anticoagulation     Eliquis    Past Surgical History  Procedure Date  . S/p right rotator cuff repair 1991    Dr. Meade Maw, right  . S/p turbt 10/01    Dr. Vonita Moss  . S/p left tkr 2002    Dr. Darrelyn Hillock  . 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 S/p d&c for removal of endometrial polyp (benign) 12/10    GYN  . Colonoscopy   . Esophagogastroduodenoscopy     History  Smoking status  . Former Smoker  . Quit date: 02/04/1994  Smokeless tobacco  . Never Used    History  Alcohol Use  . Yes    Occasionally    Family History  Problem Relation Age of Onset  . Asthma Mother   . Heart failure Mother   . Heart attack Father   . Ulcers Father   . Colon cancer Neg Hx     Review of Systems: The review of systems is per the HPI.  All other systems were reviewed and are negative.  Physical Exam: BP 118/58  Pulse 56  Ht 5\' 1"  (1.549 m)  Wt 181 lb 12.8 oz (82.464 kg)  BMI 34.35 kg/m2  SpO2 97% Her blood pressure machine correlates  nicely. Patient is very pleasant and in no acute distress. She is obese. Skin is warm and dry.  Color is normal.  HEENT is unremarkable. Normocephalic/atraumatic. PERRL. Sclera are nonicteric. Neck is supple. No masses. No JVD. Lungs are clear. Cardiac exam shows a regular rate and rhythm today. Abdomen is soft. Extremities are without edema. Gait and ROM are intact. No gross neurologic deficits noted.  LABORATORY DATA:  EKG today shows sinus bradycardia. Her rate is 52.   Lab Results  Component Value Date   WBC 10.8* 10/14/2011   HGB 15.6* 10/14/2011   HCT 47.0* 10/14/2011   PLT 254.0 10/14/2011   GLUCOSE 92 10/14/2011   CHOL 221* 04/24/2011   TRIG 85.0 04/24/2011   HDL 55.70 04/24/2011   LDLDIRECT 158.9 04/24/2011   LDLCALC 127* 10/02/2010   ALT 32 10/02/2010   AST 25 10/02/2010   NA 142 10/14/2011   K 4.2 10/14/2011   CL 106 10/14/2011   CREATININE 1.0 10/14/2011   BUN 23 10/14/2011   CO2 26 10/14/2011   TSH 0.68 08/15/2011   INR 0.94 10/02/2010   HGBA1C 6.7* 04/24/2011   Echo Study Conclusions  - Left ventricle: The cavity size was normal. There was moderate concentric hypertrophy. Systolic function was normal. The estimated ejection fraction was in the range of 55% to 60%. Wall motion was normal; there were no regional wall motion abnormalities. - Mitral valve: Mild regurgitation. - Left atrium: The atrium was mildly dilated. - Atrial septum: No defect or patent foramen ovale was identified. - Pulmonary arteries: PA peak pressure: 31mm Hg (S). - Pericardium, extracardiac: A trivial pericardial effusion was identified posterior to the heart.   Assessment / Plan:  1. PAF - She is back in sinus today. Her echo is basically ok. Those results are shared with her. I have left her on her current regimen including the Eliquis. Her CHADs is 3. She has no awareness of symptoms. I have asked her to monitor her BP and pulse at home on a daily basis and keep a diary. She may resume her activities as she desires. We will see her back in 4 weeks with an EKG.   2. GERD - she wants to go back to Protonix.  I think this is ok. I have asked her to follow up with Dr. Leone Payor.   Patient is agreeable to this plan and will call if any problems develop in the interim.

## 2011-10-16 NOTE — Patient Instructions (Addendum)
You may switch back to Prontonix  Stay on your current medicines  We will refill the Eliquis to the drug store  Monitor your blood pressure and heart rate at least once a day. Let us know if your heart rate would be below 50 or above 100 consistently  See Dr. Johney Frame in 4 weeks  Call Dr. Leone Payor to get your appointment rescheduled  Call the [facility] office at [phone] if you have any questions, problems or concerns.

## 2011-10-17 ENCOUNTER — Telehealth: Payer: Self-pay | Admitting: Internal Medicine

## 2011-10-17 ENCOUNTER — Telehealth: Payer: Self-pay

## 2011-10-17 DIAGNOSIS — R109 Unspecified abdominal pain: Secondary | ICD-10-CM

## 2011-10-17 DIAGNOSIS — R1013 Epigastric pain: Secondary | ICD-10-CM

## 2011-10-17 DIAGNOSIS — Z85118 Personal history of other malignant neoplasm of bronchus and lung: Secondary | ICD-10-CM

## 2011-10-17 NOTE — Telephone Encounter (Signed)
CT of chest, abd, and pelvis has been scheduled for 10/23/11 11:00 at [facility].   Dr. Leone Payor they would not reschedule the CT chest that Dr. Arbutus Ped ordered, due to pre-cert purposes.  I have placed an order for all under you.

## 2011-10-17 NOTE — Telephone Encounter (Signed)
Patient aware of CT, but she has plans to be out of town on the week of 10/23/11.  I have rescheduled all scans till 10/18/11 4:00.  She is advised of all instructions

## 2011-10-17 NOTE — Telephone Encounter (Signed)
Pt's SOB better with albuterol, pt has CT of chest tomorrow at 4pm. Pt c/o extreme fatigue worse now with meds started and SOB. Would like to know if you feel her medications need to be adjusted or any other tests? Told pt she will here an answer tomorrow and if not to call back. Pt agreed to plan. I will forward to NP/MA.

## 2011-10-17 NOTE — Telephone Encounter (Signed)
Pt complains of SOB which is heard in her speech/ pt doesn't seem anxious, denies edema. Pt has new onset afib and reflux and prior asthma/COPD. Vitals through day are Morning; 136/59 p 65 Noon: 102/55 p 56 Current 139/75 p 65 Pt has taken all med's but not used her albuterol inhaler. Advised using rescue inhaler and I will call back in a few minutes.

## 2011-10-17 NOTE — Telephone Encounter (Signed)
New Problem:    Patient called in because she is tired and says that her breathing is labored.  Would like to know if she needs to tweak a medicine.  Please call back.

## 2011-10-17 NOTE — Telephone Encounter (Signed)
Message copied by Annett Fabian on Thu Oct 17, 2011 10:27 AM ------      Message from: Stan Head E      Created: Wed Oct 16, 2011  3:40 PM      Regarding: needs CT       She has ongoing dyspepsia and abdominal pain            Has hx lung cancer            Has f/u chest CT scheduled for later in sept            Just here with Afib and RVR - better after cardiology eval            1) Tell her I want her to get CT chest earlier and do abd/pelvic CT also - with contrast - do at [facility] since that is where she was to get it any way - please            2) reasons are follow-up lung cancer, persistent heartburn and abdominal pain despite reflux treatment, ? Metastases            3) Ok for her to switch back to Protonix if desired

## 2011-10-18 ENCOUNTER — Ambulatory Visit (HOSPITAL_COMMUNITY)
Admission: RE | Admit: 2011-10-18 | Discharge: 2011-10-18 | Disposition: A | Discharge status: Home / Self Care | Payer: Medicare Other | Source: Ambulatory Visit | Attending: Internal Medicine | Admitting: Internal Medicine

## 2011-10-18 ENCOUNTER — Ambulatory Visit: Payer: Medicare Other | Admitting: Internal Medicine

## 2011-10-18 DIAGNOSIS — K429 Umbilical hernia without obstruction or gangrene: Secondary | ICD-10-CM | POA: Insufficient documentation

## 2011-10-18 DIAGNOSIS — N8189 Other female genital prolapse: Secondary | ICD-10-CM | POA: Insufficient documentation

## 2011-10-18 DIAGNOSIS — Z902 Acquired absence of lung [part of]: Secondary | ICD-10-CM | POA: Insufficient documentation

## 2011-10-18 DIAGNOSIS — R109 Unspecified abdominal pain: Secondary | ICD-10-CM

## 2011-10-18 DIAGNOSIS — Z85118 Personal history of other malignant neoplasm of bronchus and lung: Secondary | ICD-10-CM

## 2011-10-18 DIAGNOSIS — I708 Atherosclerosis of other arteries: Secondary | ICD-10-CM | POA: Insufficient documentation

## 2011-10-18 DIAGNOSIS — R0602 Shortness of breath: Secondary | ICD-10-CM | POA: Insufficient documentation

## 2011-10-18 DIAGNOSIS — R1013 Epigastric pain: Secondary | ICD-10-CM

## 2011-10-18 DIAGNOSIS — K3189 Other diseases of stomach and duodenum: Secondary | ICD-10-CM | POA: Insufficient documentation

## 2011-10-18 MED ORDER — IOHEXOL 300 MG/ML  SOLN
100.0000 mL | Freq: Once | INTRAMUSCULAR | Status: AC | PRN
  Administered 2011-10-18: 100 mL via INTRAVENOUS

## 2011-10-18 NOTE — Telephone Encounter (Signed)
Advised patient, call back if no better or worse

## 2011-10-18 NOTE — Telephone Encounter (Signed)
She can try cutting the Lopressor in half (12.5 BID)

## 2011-10-19 NOTE — Progress Notes (Signed)
Quick Note:  Let her know  1) CT chest stable - no signs or recurrent lung cancer - Dr. Audrie Gallus will discuss more at follow-up 2) abd/plvis main findings are a) abnormal gallbladder - thick ? Inflamed b) plaques in arteries - not new c) pelvic organ prolapse - doubt causing her GI sxs  Needs HIDA scan to evaluate the GB - upper abdominal pain, nausea and abnormal gallbladder on CT   Would also ask her to set up a surgery appointment re those sxs and abnormal GB on CT  Am ccing Dr. Arbutus Ped ______

## 2011-10-21 ENCOUNTER — Other Ambulatory Visit: Payer: Self-pay

## 2011-10-21 DIAGNOSIS — R1010 Upper abdominal pain, unspecified: Secondary | ICD-10-CM

## 2011-10-21 DIAGNOSIS — R110 Nausea: Secondary | ICD-10-CM

## 2011-10-21 DIAGNOSIS — R933 Abnormal findings on diagnostic imaging of other parts of digestive tract: Secondary | ICD-10-CM

## 2011-10-22 ENCOUNTER — Telehealth: Payer: Self-pay | Admitting: Internal Medicine

## 2011-10-22 NOTE — Progress Notes (Signed)
Quick Note:  HIDA scan should not bother claustrophobia - scanner on wheels goes over her abdomen but tech is right there with her and i think head is not under scanner - would not recommend meds for this based upon what I know   ______

## 2011-10-22 NOTE — Telephone Encounter (Signed)
Patient rescheduled for 11/01/11 11:30

## 2011-10-23 ENCOUNTER — Other Ambulatory Visit (HOSPITAL_COMMUNITY): Payer: Medicare Other

## 2011-10-25 ENCOUNTER — Telehealth: Payer: Self-pay | Admitting: Internal Medicine

## 2011-10-25 ENCOUNTER — Telehealth: Payer: Self-pay | Admitting: Pulmonary Disease

## 2011-10-25 NOTE — Telephone Encounter (Signed)
Left message for patient to call back  

## 2011-10-25 NOTE — Telephone Encounter (Signed)
Pt had full labs in march 2013.  Had bmp and cbcd done on 10-14-2011.  Please advise if pt will need any labs prior to her appt with SN on 9/26.  thanks

## 2011-10-28 ENCOUNTER — Other Ambulatory Visit: Payer: Self-pay | Admitting: Pulmonary Disease

## 2011-10-28 DIAGNOSIS — E785 Hyperlipidemia, unspecified: Secondary | ICD-10-CM

## 2011-10-28 NOTE — Telephone Encounter (Signed)
Pt returned triage's call.  Advised lip & hepatic level labs have been ordered & are ready for pt to complete.  Pt verbalized understanding & stated nothing further needed at this time.  Antionette Fairy

## 2011-10-28 NOTE — Telephone Encounter (Signed)
LMOMTCB x 1 

## 2011-10-28 NOTE — Telephone Encounter (Signed)
Per SN----ok to come in for lip and hepatic level.  This is all the labs that are needed.  These orders have already been placed for the pt.  thanks

## 2011-10-28 NOTE — Telephone Encounter (Signed)
Discussed with the patient the HIDA scan.  She is worried about her claustrophobia.  She will call back for any additional questions

## 2011-10-29 ENCOUNTER — Telehealth: Payer: Self-pay | Admitting: Internal Medicine

## 2011-10-29 ENCOUNTER — Other Ambulatory Visit (INDEPENDENT_AMBULATORY_CARE_PROVIDER_SITE_OTHER): Payer: Medicare Other

## 2011-10-29 ENCOUNTER — Ambulatory Visit: Payer: Medicare Other | Admitting: Internal Medicine

## 2011-10-29 DIAGNOSIS — E785 Hyperlipidemia, unspecified: Secondary | ICD-10-CM

## 2011-10-29 LAB — LIPID PANEL
HDL: 36.9 mg/dL — ABNORMAL LOW (ref >39.00)
LDL Cholesterol: 121 mg/dL — ABNORMAL HIGH (ref 0–99)
VLDL: 25.2 mg/dL (ref 0.0–40.0)

## 2011-10-29 LAB — HEPATIC FUNCTION PANEL
ALT: 23 U/L (ref 0–35)
Bilirubin, Direct: 0.1 mg/dL (ref 0.0–0.3)
Total Bilirubin: 0.7 mg/dL (ref 0.3–1.2)
Total Protein: 7.4 g/dL (ref 6.0–8.3)

## 2011-10-29 NOTE — Telephone Encounter (Signed)
pt called to cx lab and scans for 9/26 as this was already done by dr Leone Payor.  pt will keep f/u on 9/30    aom

## 2011-10-30 ENCOUNTER — Encounter (HOSPITAL_COMMUNITY)
Admission: RE | Admit: 2011-10-30 | Discharge: 2011-10-30 | Disposition: A | Discharge status: Home / Self Care | Payer: Medicare Other | Source: Ambulatory Visit | Attending: Internal Medicine | Admitting: Internal Medicine

## 2011-10-30 DIAGNOSIS — R1011 Right upper quadrant pain: Secondary | ICD-10-CM | POA: Insufficient documentation

## 2011-10-30 DIAGNOSIS — R110 Nausea: Secondary | ICD-10-CM | POA: Insufficient documentation

## 2011-10-30 DIAGNOSIS — R1010 Upper abdominal pain, unspecified: Secondary | ICD-10-CM

## 2011-10-30 DIAGNOSIS — R933 Abnormal findings on diagnostic imaging of other parts of digestive tract: Secondary | ICD-10-CM

## 2011-10-30 MED ORDER — TECHNETIUM TC 99M MEBROFENIN IV KIT
5.3000 | PACK | Freq: Once | INTRAVENOUS | Status: AC | PRN
  Administered 2011-10-30: 5 via INTRAVENOUS

## 2011-10-31 ENCOUNTER — Ambulatory Visit (INDEPENDENT_AMBULATORY_CARE_PROVIDER_SITE_OTHER): Payer: Medicare Other | Admitting: Pulmonary Disease

## 2011-10-31 ENCOUNTER — Other Ambulatory Visit: Payer: Medicare Other | Admitting: Lab

## 2011-10-31 ENCOUNTER — Inpatient Hospital Stay (HOSPITAL_COMMUNITY)
Admission: RE | Admit: 2011-10-31 | Discharge: 2011-10-31 | Payer: Medicare Other | Source: Ambulatory Visit | Attending: Internal Medicine | Admitting: Internal Medicine

## 2011-10-31 ENCOUNTER — Encounter: Payer: Self-pay | Admitting: Pulmonary Disease

## 2011-10-31 VITALS — BP 114/62 | HR 60 | Temp 97.3°F | Ht 62.0 in | Wt 183.8 lb

## 2011-10-31 DIAGNOSIS — I4891 Unspecified atrial fibrillation: Secondary | ICD-10-CM

## 2011-10-31 DIAGNOSIS — E669 Obesity, unspecified: Secondary | ICD-10-CM

## 2011-10-31 DIAGNOSIS — I10 Essential (primary) hypertension: Secondary | ICD-10-CM

## 2011-10-31 DIAGNOSIS — M899 Disorder of bone, unspecified: Secondary | ICD-10-CM

## 2011-10-31 DIAGNOSIS — J4489 Other specified chronic obstructive pulmonary disease: Secondary | ICD-10-CM

## 2011-10-31 DIAGNOSIS — C3490 Malignant neoplasm of unspecified part of unspecified bronchus or lung: Secondary | ICD-10-CM

## 2011-10-31 DIAGNOSIS — F411 Generalized anxiety disorder: Secondary | ICD-10-CM

## 2011-10-31 DIAGNOSIS — K219 Gastro-esophageal reflux disease without esophagitis: Secondary | ICD-10-CM

## 2011-10-31 DIAGNOSIS — M949 Disorder of cartilage, unspecified: Secondary | ICD-10-CM

## 2011-10-31 DIAGNOSIS — E119 Type 2 diabetes mellitus without complications: Secondary | ICD-10-CM

## 2011-10-31 DIAGNOSIS — E042 Nontoxic multinodular goiter: Secondary | ICD-10-CM

## 2011-10-31 DIAGNOSIS — I739 Peripheral vascular disease, unspecified: Secondary | ICD-10-CM

## 2011-10-31 DIAGNOSIS — I872 Venous insufficiency (chronic) (peripheral): Secondary | ICD-10-CM

## 2011-10-31 DIAGNOSIS — J449 Chronic obstructive pulmonary disease, unspecified: Secondary | ICD-10-CM

## 2011-10-31 DIAGNOSIS — M1990 Unspecified osteoarthritis, unspecified site: Secondary | ICD-10-CM

## 2011-10-31 DIAGNOSIS — E785 Hyperlipidemia, unspecified: Secondary | ICD-10-CM

## 2011-10-31 MED ORDER — TIOTROPIUM BROMIDE MONOHYDRATE 18 MCG IN CAPS: 18.0000 ug | ORAL_CAPSULE | Freq: Every day | RESPIRATORY_TRACT | Status: DC

## 2011-10-31 MED ORDER — FLUTICASONE-SALMETEROL 500-50 MCG/DOSE IN AEPB: 1.0000 | INHALATION_SPRAY | Freq: Two times a day (BID) | RESPIRATORY_TRACT | Status: DC

## 2011-10-31 NOTE — Patient Instructions (Addendum)
Today we updated your med list in our EPIC system...    Continue your current medications the same...  We reviewed your recent blood work & XRay results...  For your Constipation:    Take OTC MIRALAX 1 capful in water daily...    And add SENAKOT-S 1-2 tabs at bedtime...  Call for any questions...  Let's plan a follow up visit in 6 months.Marland KitchenMarland Kitchen

## 2011-10-31 NOTE — Progress Notes (Signed)
Subjective:    Patient ID: [name], female    DOB: 1934-10-30, 77 y.o.   MRN: [id number]  HPI 77 y/o WF here for a follow up visit... she has mult medical specialists who follow all of her medical problems (see below)...  ~  September 25, 2009:  20mo ROV- c/o cough "at least 2 times per day" & tongue sore... she has had follow ups w/ her specialists:  DrRamos 5/11 for LBP to right leg & given epid steroid shot (improved)...  DrBurney 6/11 CXR stable & CT planned in Aug, she had some dypnea which he related to the amt of lung tissue resected...  DrGerkin 6/11 for bilat thyroid nodules w/ benign bx- f/u sonar w/ multinod goiter, no ch in nodules, TSH= WNL.[name].  she has f/u appt w/ DrMohammed in several weeks...  ~  December 25, 2009:  Add-on appt for "sinus"- c/o right eye problem "it's allergy" & notes red, swollen, right sided facial pain & HA;  notes drainage "it pours" mucus, congestion; hurts in her teeth down to her jaw, but denies fever, discolored phlegm or blood... 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  we discussed checking sinus XRays & treating her w/ Depo/ Pred, Augmentin, Mucinex >> if symptoms persist she will need Neuro f/u for ?atypic facial pain?  ~  April 12, 2010:  She notes mult somatic concerns> SOB- "it's my weight", Cluster HAs- improved off wine "hopefully they are gone for the next 16yr cycle", right great toenail infection surg by podiatry & finally improved but reactions to Kelflex & Septra w/ nausea & diarrhea...     She had CT 3/12 per DrMohamed> COPD & post surg changes, <1cm ground glass nodule LUL w/o change, stable/ NAD, hep steatosis;  they plan continued watchful waiting...    BP controlled on meds> 140/76 today & similar at home;  needs better diet & wt reduction to keep from having to incr her BP meds...    Chol looks fair on Fenoglide + FishOil  as she is intol to all statins> TChol 215, TG 181, HDL 40, LDL 157...    DM control is adeq on the Metformin alone>  Bs=140, A1c=6.8, but wt is up 11# to 197# & we reviewed low carb, low chol/fat diets...    Thyroid> TSH=1.08 (not on meds) & she reports being released by DrGerkins, no change x39yrs...    LABS 04/09/10 also shows Vit D still low at 24 ?on 2000u daily supplement> she prefers 50K weekly & we wrote Rx... we discussed Shingles vaccine for her at her convenience...  ~  October 04, 2010:  61mo ROV & post hosp visit>  She has had mult bronchitic exac over the interval, seen by TP w/ antibiotic & Pred rx; she has maintained a hectic personal life & travel schedule (she likes DepoMedrol shots prior to her trips;  Recent refractory AB episode w/o the usual improvement on Rx & reported choking on her dinner w/ incr dyspnea & hypoxemia requiring Hosp ==> responded slowly to Rx until she coughed up a chunk of onion w/ more rapid improvement after that;  disch on Pred taper, Dulera, Spiriva, Mucinex, etc; she was seen by speech path but no asp seen w/ any consistancy tested & rec for sm bites, chew carefully, swallow deliberately & slowly...     Since disch she's  been stable (see meds below) & PFT today w/ severe airflow obstruction & FEV1=1.08 (56%), %1sec=48...  ~  December 05, 2010:  59mo ROV & she notes incr SOB w/ activ, & states she never got back to her prev baseline after last hosp & upset because she missed 2 trips; she notes that she hurt her back & hasn't been exercising because of this- since then incr SOB w/ some ADLs and incr edema in her legs;  She notes that she "can't get a deep breath" & she appears more anxious than usual w/ mult somatic complaints- indigestion, cramps in hands & feet, etc...  We discussed taking the Alprazolam 0.25mg  Tid regularly for her chest wall musc spasm so she can get a good deep breath & see how this works (she is asking about Accolate because a friend is on it & it  really helped them)...    COPD w/ revers component> on Advair500 but only using it once/d (she stopped [facility] stating it made her mouth sore), Spiriva, Mucinex, & Proventil HFA; asked (again) to maximize her regimen w/ AdvairBid, Mucinex2Bid, Fluids, etc; last PFT w/ FEV1=1.08 stable; see above...    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 NOTE: element of lung restriction from surg & obesity...    HBP> on DiovanHCT, Verapamil, & Lasix prn; BP= 144/80 & similar at home; she has mult somatic complaints...    Ven Insuffic> she knows to elim sodium but she eats out often, elev legs, wear support hose; she has been taking the Lasix20mg  daily due to the swelling & improved...     Hyperlipid> on Fenoglide120 & FishOil; she states INTOL to all statins; last FLP 8/12 in hosp as below; asked to ret for fasting blood work...    DM> on Metform500/d; BS=141, A1c=7.0; she will need more meds if she can't get on diet 7 get weight down...    Obesity> wt=198# which is up 12# in the last 59mo!!! We reviewed low carb, low fat, wt reducing diet 7 exercise prescription...    Multinod Goiter> prev eval from [place] DrGerkin; TFTs showed TSH=0.97, and FreeT3/ FreeT4 are wnl...    GI> GERD, Polyps> on Prilosec & had episode of choking on dinner w/ resp exac- see speech path eval; followed by DrGessner for GI w/ 54yr f/u colonoscopy due soon...    GU> Bladder Cancer> Papilllary TCCa resected 2001 & no recurrence; followed by DrPeterson w/ yearly cystos neg...    DJD/ LBP> on musc relaxer & Advil; prev rotator cuff surg 1991, left TKR 2002, now followed by DrRamos for LBP & plans shot in back (she reports it's improved spontaneously)...    Osteopenia> on Actonel, calcium, MVI, Vit D; ?when her last BMD was done?     Anxiety> as noted- she has Alpraz for prn use but doesn't take it often...  ~  April 30, 2011:  34mo ROV & [name] has been stable w/ her severe lung disease as noted; unfortunately she still self-adjusts her meds despite my admonition to take meds regularly as prescribed for max benefit; she tells me she has "6 trips" coming up & she wants her "magic shot" meaning DepoMedrol; we have on numerous occas discussed the importance of minimizing the cortisone Rx & maximizing her bronchdilators etc; plus we have implored her to decrease her  aggressive travel schedule fearing that she will have a resp exacerbation (w/ acute resp failure) [place]... See prob list below>> LABS 3/13:  FLP- not at goal on Feno120;  Chems- ok x BS=124 A1c=6.7 on Metform monotherapy...  ~  October 31, 2011:  92mo ROV & [name] states she decided to "get healthy" & hired a Systems analyst but during her 1st session she developed an irreg tachyarrhythmia & was Dx w/ AFib & rvr> seen by [employer] & DrAllred & treated w/ Eliquis5Bid, Metoprolol25-1/2Bid, along w/ her Verelan & DiovanHCT;  2DEcho showed modLVH, norm LVF w/ EF=55-60%, no regional wall motion abn, mildMR, mildLAdil, no ASD/PFO, PAsys=31;  Now improved & feeling better...    She was also being evaluated by DrGessner for nausea x20mo> she states nothing helps- on Zofran, Align, etc;  He did EGD/ Colon 6/13 w/ gastric polyps (hyperplastic), colon polyps (largest 56mm= tub adenoma), mild divertics, int hems;  He did AbdSonar- normal GB, no dilatation, +hepatic steatosis; Then CT Abd showed a decompressed GB, some wall thickening ?inflamm, and other findings like extensive atherosclerotic dis in abd, sm umbil hernia, & severe pelvic organ prolapse; finally HIDA scan normal; she tells me she has appt w/ DrGerkin to consider GB surg...    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 2) History of stage IA (T1, N0, MX) non-small cell lung cancer, adenocarcinoma with bronchoalveolar features diagnosed in November 2005 involving the right upper lobe. Prior Therapy: 1) Status post right upper lobectomy on January 03, 2004. 2) Status post wedge resection of the left upper lobe on February 06, 2009 under the care of Dr. Edwyna Shell. Current Therapy:  Observation    We reviewed prob list, meds, xrays and labs> see below for updates >> she will get the 2013 flu shot at [facility]...    Problem List:      ASTHMA (ICD-493.90) / COPD (ICD-496) - ex-smoker, quit 1997... Back on ADVAIR500 & SPIRIVA 1/d + MUCINEX 2Bid & PROVENTIL Prn... she was participating in [facility] (last 3/09) & stopped on her own... may have a superimposed component of restriction due to obesity & prev lung surgeries... ~  11/05: s/p RULobectomy for lung ca- (adeno w/ bronchoalveolar cell features). ~  PFT 8/08 showed FVC=2.02 (77%), FEV1=1.07 (51%), %1sec=53, mid-flows=19%pred. ~  PFT 7/09 today= FVC=1.93 (72%), FEV1=1.04 (49%), %1sec=54, mid-flows=19%pred. ~  1/11:  s/p LUL resection by DrBurney> multicentric bronchoalveolar cell cancer. ~  PFT 8/12 showed FVC=2.25 (88%), FEV1=1.08 (56%), %1sec=48, mid-flows=30% pred. ~  10/12:  She reports Dulera caused sore mouth so she switched back to Advair500 but asked to do this Bid regularly.  Hx of LUNG CANCER (ICD-162.9) - Hx multicentric bronchoalveolar cell lung cancer >> followed by DrBurney for CVTS & DrMohammed for Oncology... ~  s/p right upper lobectomy by DrBurney 11/05 for a stage 1A non-small cell lung cancer (adenocarcinoma w/ bronchalveolar cell features); post-op observation only... ~  CT Chest 11/08 showed no recurrence, mult bilat nodules unchanged x3+ yrs, nodular thyroid w/o change... ~  CXR 7/09 showed stable post-op  changes and scarring on the right, NAD.[name]. ~  CTAngio Chest 10/09 showed neg for PE, prom thyroid, atherosclerotic changes in Ao, no change in ground-glass nodules in LUL area... ~  CT Chest 11/10 by DrMohammed showed new LUL solid nodule, no change in ground-glass areas... lesion was PET pos... ~  1/11:  s/p LULobectomy & node dissection via minithoracotomy by DrBurney- path showed 3 foci of well diff bronchoalveolar cell ca & neg nodes... decision made at conference for no chemoRx, EGFR assay was neg... ~  she continues to have monitoring by DrBurney/ DrMohammed> on observation alone now; note from DrMohammed 9/12 indicated she was stable & he plans f/u CT Chest & OV in 39mo... ~  Last CT Chest 9/12 showed stable 9mm ground glass opac LUL w/o change from 3/12 scan; +post op changes, COPD, coronary calcif, & hep steatosis...  HYPERTENSION (ICD-401.9) - on DIOVAN/Hct 160-12.5 Daily, VERAPAMIL SR 360mg /d, & LASIX 20mg  "Prn"...  ~  Cardiac eval 9/09 by Walker Kehr was neg and 2DEcho showed DD w/ norm LVsys function, EF= 60-65%, no regional wall motion abn... ~  3/13:  BP= 122/72 & feeling OK, tolerating Rx (DiovHct 160/12.5 & Verelan240)... denies CP, palipit, dizziness, syncope, ch in dyspnea, edema, etc...  ~  9/13:  BP= 114/62 & meds adjusted in the interval w/ DiovHct 160/12.5 & Verelan360 (plus Metop25-1/2Bid now)...  PAROXYSMAL ATRIAL FIBRILLATION (ICD-427.31) - hx PAF in the post op period... converted to NSR & holding... transiently on Amiodarone in past & she wanted off Rx.  ~  Developed AFib w/ rvr 9/13 & eval by DrAllred> treated w/ Eliquis5Bid, Metoprolol25-1/2Bid, along w/ her Verelan & DiovanHCT... ~  2DEcho 9/13 showed modLVH, norm LVF w/ EF=55-60%, no regional wall motion abn, mildMR, mildLAdil, no ASD/PFO, PAsys=31  PERIPHERAL VASCULAR DISEASE (ICD-443.9) - she has atherosclerotic changes in her Ao noted on her prev scans...  ~  11/10: pt had mult questions about this problem on the prob  list- discussed "hardening of the arteries" in detail.  VENOUS INSUFFICIENCY (ICD-459.81) - she knows to follow a low sodium diet, elevate legs, wear support hose, etc; she insists on keeping Lasix 20mg  on hand for Prn use...  HYPERLIPIDEMIA (ICD-272.4) - prev on Livolo 2mg - 1/2 tab daily (stopped due to aching), +FENOGLIDE 120mg /d, FISH OIL & CoQ10  supplements... prev on Vytorin but INTOL ==> she states INTOL to all statins... she was not satis w/ the Lipid Clinic in the past. ~  labs 8/08 off Vytorin showed TChol 183, TG 207, HDL 46, LDL 112... try fenofibrate... ~  FLP 5/09 on Feno120 showed TChol 188, TG 111, HDL 28, LDL 137... cont same, better diet, get wt down! ~  FLP 4/10 on Feno120 showed TChol 240, TG 104, HDL 49, LDL 165... I rec f/u Lipid Clinic, she declined. ~  FLP 7/10 on Feno120 showed TChol 222, TG 152, HDL 36, LDL 172... rec> try LIVALO 2mg - 1/2 tab Qhs, she stopped. ~  FLP 11/10 on Feno120+FishOil showed TChol 253, TG 125, HDL 42, LDL 208... try LIVALO2mg - 1/2 tab/d & stay on it. ~  FLP 1/11 on Liv1mg +Feno120 showed TChol 170, TG 101, HDL 45, LDL 105... continue same. ~  3/11: she reports aching all over & DrBurney stopped the Livolo> contin diet + other meds. ~  FLP 8/11 showed TChol 225, TG 238, HDL 41, LDL 156... INTOL all statins, she'll do the best she can w/ diet. ~  FLP 8/12 in hosp showed TChol 234, TG 276, HDL 52, LDL 127... Counseled on low chol, low fat, wt reducing diet. ~  FLP 3/13 on Feno120 showed TChol 221, TG 85, HDL 56, LDL 159... She refuses Statin Rx or Lipid  Clinic referral...  DIABETES MELLITUS (ICD-250.00) - started on METFORMIN 500mg Bid 4/10, but decr on her own to 1 daily 1/11... we had stressed the importance of diet- low carb/ low fat and weight reduction, along w/ her pulm rehab exercises... ~  labs 4/10 showed BS= 131, HgA1c= 7.0.Marland KitchenMarland Kitchen Metformin500Bid started. ~  labs 7/10 showed BS= 134, A1c= 6.0 ~  labs 11/10 showed BS= 148, A1c= 6.5 ~  1/11:  she  cut the Metformin to 1/d due to nausea. ~  labs 8/11 showed BS= 137, A1c= 5.7.Marland KitchenMarland Kitchen continue same, get wt down. ~  Labs 8/12 in hosp on Metform500/d showed BS= 110-230, A1c=6.5 ~  Labs 10/12 on Metform500/d showed BS= 141, A1c=7.0.Marland KitchenMarland Kitchen Needs better diet, get wt down, or more meds. ~  1/13:  Ophthalmology check up by DrMcCuen> neg- no diabetic retinopathy... ~  Labs 3/13 on Metform500/d showed BS= 124, A1c= 6.7  NONTOXIC MULTINODULAR GOITER (ICD-241.1) - eval and rx by DrBalan for Endocrinology & DrGerkin for CCS; she is asymptomatic; dominant nodule was needled and benign; surg consult from DrGerkin- elected observation & he checks her yearly... ~  seen 6/10 by DrGerkin- f/u sonar w/o change in any of the nodules... ~  labs 7/10 showed TSH= 0.89 ~  seen 6/11 by DrGerkin- f/u sonar w/o change in nodules. ~  Labs 8/12 showed TSH= 0.047... Not on Thyroid meds, needs f/u DrBalan (she never went). ~  Labs 10/12 showed TSH= 0.97, FreeT3= 3.2 (2.3-4.2), FreeT4= 0.88 (0.60-1.60)  OBESITY (ICD-278.00) - obese w/ abd panniculus & we discussed diet + exercise strategies.[name]. ~  weight up to 205# 11/09- we discussed diet, calorie restriction, exercise, & get the weight down... ~  weight 4/10 = 198# ~  weight 7/10 = 194# ~  weight 11/10 = 196# ~  weight 2/11 = 184# (post op) ~  weight 8/11 = 181# ~  weight 11/11 = 186#... she needs to do better! ~  Weight 8/12 = 186# ~  Weight 10/12 = 198#... What happened? ~  Weight 3/13 = 189#... Keep up the good work! ~  Weight 9/13 = 184#  GERD (ICD-530.81) - on PRILOSEC 20mg /d... ~  EDG 6/13 by DrGessner showed mult gastric polyps (hyperplastic), mod gastritis, & treated w/ Omep20mg /d...  COLON POLYPS, DIVERTICULOSIS, HEMORRHOIDS >> ~  colonoscopy 7/09 by DrGessner showed 4 sm polyps= tubular adenoma on bx... f/u planned 3 yrs... ~  Colonoscopy 6/13 by DrGessner showed mult sm polyps, largest 55mm= tub adenoma, plus mild diverticvs, int hems,  etc...  NEPHROLITHIASIS, HX OF (ICD-V13.01)  Hx of BLADDER CANCER (ICD-188.9) - had hematuria in 2001 & referred to DrPeterson... 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 all subseq cystoscopies have been neg- no recurrence. ~  she reports f/u w/ DrPeterson yearly & doing satis by her report.  DEGENERATIVE JOINT DISEASE (ICD-715.90) - s/p rotator cuff repair in 1991... s/p left TKR from DrGioffre in 2002...  OSTEOPENIA (ICD-733.90) - off prev ACTONEL 150/mo for a drug holiday, ca++, MVI, etc... ~  I cannot find baseline BMD in EPIC or cone system> ?done by GYN? ~  labs 8/11 showed Vit D level = 22... rec> start Vit D supplement extra 02-1998 u daily... ~  2013:  Actonel removed from her list by DrGessner for drug holiday...  ANXIETY (ICD-300.00) - on XANAX 0.25mg  Prn... she has signif hx of claustrophobia, but denies that she has any anxiety...   Past Surgical History  Procedure Date  .  S/p right rotator cuff repair 1991    Dr. Meade Maw, right  . S/p turbt 10/01    Dr. Vonita Moss  . S/p left tkr 2002    Dr. Darrelyn Hillock  . 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 S/p d&c for removal of endometrial polyp (benign) 12/10    GYN  . Colonoscopy   . Esophagogastroduodenoscopy     Outpatient Encounter Prescriptions as of 10/31/2011  Medication Sig Dispense Refill  . albuterol (PROVENTIL HFA) 108 (90 BASE) MCG/ACT inhaler Inhale 2 puffs into the lungs every 6 (six) hours as needed.        [name] apixaban (ELIQUIS) 5 MG TABS tablet Take 1 tablet (5 mg total) by mouth 2 (two) times daily.  60 tablet  3  . Biotin 5000 MCG TABS Take 1 tablet by mouth daily.      . cetirizine (ZYRTEC) 10 MG tablet Take 10 mg by mouth as needed.       . Coenzyme Q10 (COQ10) 200 MG CAPS Take 1 capsule  by mouth daily.      . Cyanocobalamin (VITAMIN B 12 PO) Take by mouth. 1000 mcg once a day       . DIOVAN HCT 160-12.5 MG per tablet TAKE 1 TABLET BY MOUTH ONCE DAILY  30 tablet  10  . FENOGLIDE 120 MG TABS TAKE 1 TABLET BY MOUTH ONCE DAILY  30 tablet  0  . Fluticasone-Salmeterol (ADVAIR DISKUS) 500-50 MCG/DOSE AEPB Inhale 1 puff into the lungs 2 (two) times daily.  60 each  3  . furosemide (LASIX) 20 MG tablet take 1 tablet by mouth once daily if needed for SWELLING  30 tablet  PRN  . metFORMIN (GLUCOPHAGE) 500 MG tablet take 1 tablet by mouth every morning  30 tablet  6  . metoprolol tartrate (LOPRESSOR) 25 MG tablet 1/2 tablet twice a day      . Multiple Vitamin (MULTIVITAMIN PO) Take by mouth. Once a day       . Omega-3 Fatty Acids (FISH OIL) 1200 MG CAPS Take by mouth. Once a day      . ondansetron (ZOFRAN-ODT) 4 MG disintegrating tablet Take 4 mg by mouth every 8 (eight) hours as needed.      . Probiotic Product (ALIGN PO) Take 1 tablet by mouth daily.      [name] SPIRIVA HANDIHALER 18 MCG inhalation capsule INHALE THE CONTENTS OF 1 CAPSULE VIA HANDIHALER ONCE DAILY.  30 capsule  5  . traMADol (ULTRAM) 50 MG tablet Take 1 tablet (50 mg total) by mouth 3 (three) times daily as needed.  90 tablet  5  . verapamil (VERELAN PM) 360 MG 24 hr capsule Take 1 capsule (360 mg total) by mouth at bedtime.  30 capsule  6  . Vitamin D, Ergocalciferol, (DRISDOL) 50000 UNITS CAPS take 1 capsule by mouth every week  4 capsule  12  . DISCONTD: Glucosamine-Chondroit-Vit C-Mn (GLUCOSAMINE CHONDR 1500 COMPLX) CAPS Take 1 capsule by mouth daily.      [name] DISCONTD: pantoprazole (PROTONIX) 40 MG tablet Take 1 tablet (40 mg total) by mouth daily.       Facility-Administered Encounter Medications as of 10/31/2011  Medication Dose Route Frequency Provider Last Rate Last Dose  . 0.9 %  sodium chloride infusion  500 mL Intravenous Continuous Maryjean Morn  Leone Payor, MD        Allergies                                          Pt  states INTOL to ALL STATINS...  Allergen Reactions  . Cephalexin     REACTION: nausea and diarrhea  . Codeine     REACTION: nausea  . Epinephrine     REACTION: nervous  . Erythromycin     REACTION: all mycins cause yeast infections  . Morphine     REACTION: nausea  . Sulfamethoxazole W/Trimethoprim     REACTION: nausea and diarrhea    Current Medications, Allergies, Past Medical History, Past Surgical History, Family History, and Social History were reviewed in [employer] record.    Review of Systems         See HPI - all other systems neg except as noted...  The patient complains of hoarseness, dyspnea on exertion, headaches, muscle weakness, and difficulty walking.  The patient denies anorexia, fever, weight loss, weight gain, vision loss, decreased hearing, chest pain, syncope, peripheral edema, prolonged cough, hemoptysis, abdominal pain, melena, hematochezia, severe indigestion/heartburn, hematuria, incontinence, suspicious skin lesions, transient blindness, depression, unusual weight change, abnormal bleeding, enlarged lymph nodes, and angioedema.     Objective:   Physical Exam     WD, WN, 77 y/o  WF in NAD... GENERAL:  Alert & oriented; pleasant & cooperative... HEENT:  [place]/AT, EOM-wnl, PERRLA, EACs-clear, TMs-wnl, NOSE-clear, THROAT-clear & wnl. NECK:  Supple w/ fairROM; no JVD; normal carotid impulses w/o bruits; no thyromegaly or nodules palpated; no lymphadenopathy. CHEST:  Decr BS bilat; scat wheezing/ rhonchi bilat, no rales or consolidation; s/p VATS surg scar on right & mini thoracotomy scar on left... sl tender to palp left chest wall... HEART:  Regular Rhythm; without murmurs/ rubs/ or gallops heard... ABDOMEN:  Obese w/ panniculus, soft & nontender; normal bowel sounds; no organomegaly or masses detected. EXT:  S/P left TKR; mild arthritic changes; no varicose veins/ +venous insuffic/ tr edema. NEURO:  CN's intact;  no focal neuro  deficits... sl tender over right max sinus. DERM:  No lesions noted; no rash etc...  RADIOLOGY DATA:  Reviewed in the EPIC EMR & discussed w/ the patient...  LABORATORY DATA:  Reviewed in the EPIC EMR & discussed w/ the patient...   Assessment & Plan:    COPD/ AB/ restriction from 2 surgeries>  She has severe airflow obstruction on PFT & we discussed the improtance of: 1) regular med regimen w/ Advair, Spiriva, Mucinex, etc;  2) Must lose weight;  3) must increase exercise program (she declines restart of PulmRehab)...  and I would add 4) careful swallowing, avoid asp, etc...  Hx Lung Cancer w/ 2 prev surgeries>  Followed regularly by DrBurney, DrMohammed; last CT Chest 9/13 was stable...  HBP>  Controlled on  Diovan/HCT & Verapamil, & Metoprolol added for rate control by cards...  AFIB>  PAF episode treated by DrAllred w/ Eliquis & Metoprolol, back in NSR...  HYPERLIPID>  She is INTOL to all statins, on diet/ exercise/ Fenofib/ FishOil; recent FLP remains poorly controlled & she will attempt to improve diet & lose weight...  DM>  Control is barely adeq w/ Metformin 500mg /d & A1c= 7.0; we discussed better low carb diet & exercise program...  Multinod Thyroid>  She never did call DrBalan for f/u; repeat labs show that  sh is still biochem euthyroid...  Obesity>  Diet + exercise are the keys here...  GERD>  Recent MBS by speech path w/o asp or penetration; she needs to eat more slowly, chew well, swallow carefully; rec to continue Prilosec, elev HOB, & NPO after dinner...  DJD/ Osteopenia>  OK to restart the Actonel weekly...  Anxiety>  Prev on Xanax as needed, & doesn't think she needs a nerve pill; we discussed trial of KLONOPIN 0.5mg  bid to help her breathing...   Patient's Medications  New Prescriptions   No medications on file  Previous Medications   ALBUTEROL (PROVENTIL HFA) 108 (90 BASE) MCG/ACT INHALER    Inhale 2 puffs into the lungs every 6 (six) hours as needed.      APIXABAN (ELIQUIS) 5 MG TABS TABLET    Take 1 tablet (5 mg total) by mouth 2 (two) times daily.   BIOTIN 5000 MCG TABS    Take 1 tablet by mouth daily.   CETIRIZINE (ZYRTEC) 10 MG TABLET    Take 10 mg by mouth as needed.    COENZYME Q10 (COQ10) 200 MG CAPS    Take 1 capsule by mouth daily.   CYANOCOBALAMIN (VITAMIN B 12 PO)    Take by mouth. 1000 mcg once a day    DIOVAN HCT 160-12.5 MG PER TABLET    TAKE 1 TABLET BY MOUTH ONCE DAILY   FENOGLIDE 120 MG TABS    TAKE 1 TABLET BY MOUTH ONCE DAILY   FUROSEMIDE (LASIX) 20 MG TABLET    take 1 tablet by mouth once daily if needed for SWELLING   METFORMIN (GLUCOPHAGE) 500 MG TABLET    take 1 tablet by mouth every morning   METOPROLOL TARTRATE (LOPRESSOR) 25 MG TABLET    1/2 tablet twice a day   MULTIPLE VITAMIN (MULTIVITAMIN PO)    Take by mouth. Once a day    OMEGA-3 FATTY ACIDS (FISH OIL) 1200 MG CAPS    Take by mouth. Once a day   ONDANSETRON (ZOFRAN-ODT) 4 MG DISINTEGRATING TABLET    Take 4 mg by mouth every 8 (eight) hours as needed.   PROBIOTIC PRODUCT (ALIGN PO)    Take 1 tablet by mouth daily.   TRAMADOL (ULTRAM) 50 MG TABLET    Take 1 tablet (50 mg total) by mouth 3 (three) times daily as needed.   VERAPAMIL (VERELAN PM) 360 MG 24 HR CAPSULE    Take 1 capsule (360 mg total) by mouth at bedtime.   VITAMIN D, ERGOCALCIFEROL, (DRISDOL) 50000 UNITS CAPS    take 1 capsule by mouth every week  Modified Medications   Modified Medication Previous Medication   FLUTICASONE-SALMETEROL (ADVAIR DISKUS) 500-50 MCG/DOSE AEPB Fluticasone-Salmeterol (ADVAIR DISKUS) 500-50 MCG/DOSE AEPB      Inhale 1 puff into the lungs 2 (two) times daily.    Inhale 1 puff into the lungs 2 (two) times daily.   TIOTROPIUM (SPIRIVA HANDIHALER) 18 MCG INHALATION CAPSULE SPIRIVA HANDIHALER 18 MCG inhalation capsule      Place 1 capsule (18 mcg total) into inhaler and inhale daily.    INHALE THE CONTENTS OF 1 CAPSULE VIA HANDIHALER ONCE DAILY.  Discontinued Medications    GLUCOSAMINE-CHONDROIT-VIT C-MN (GLUCOSAMINE CHONDR 1500 COMPLX) CAPS    Take 1 capsule by mouth daily.   PANTOPRAZOLE (PROTONIX) 40 MG TABLET    Take 1 tablet (40 mg total) by mouth daily.

## 2011-11-01 ENCOUNTER — Ambulatory Visit (INDEPENDENT_AMBULATORY_CARE_PROVIDER_SITE_OTHER): Payer: Medicare Other | Admitting: Internal Medicine

## 2011-11-01 ENCOUNTER — Encounter: Payer: Self-pay | Admitting: Internal Medicine

## 2011-11-01 VITALS — BP 104/54 | HR 60 | Ht 61.0 in | Wt 182.5 lb

## 2011-11-01 DIAGNOSIS — R110 Nausea: Secondary | ICD-10-CM

## 2011-11-01 DIAGNOSIS — R932 Abnormal findings on diagnostic imaging of liver and biliary tract: Secondary | ICD-10-CM

## 2011-11-01 DIAGNOSIS — R5381 Other malaise: Secondary | ICD-10-CM

## 2011-11-01 DIAGNOSIS — R5383 Other fatigue: Secondary | ICD-10-CM

## 2011-11-01 NOTE — Patient Instructions (Addendum)
Dr. Leone Payor will be in touch with you after he consults with Dr. Gerrit Friends.  Thank you for choosing me and [facility].  Iva Boop, M.D., [facility]

## 2011-11-01 NOTE — Progress Notes (Signed)
Patient ID: [name], female   DOB: August 14, 1934, 77 y.o.   MRN: [id number]   Patient returns for followup of abdominal pain and nausea.  When last seen  she was in atrial fibrillation with rapid ventricular response. I sent her to cardiology, her rate is under much better control. Started on an anticoagulant medication and reduce the risk of stroke.  She had a CT scan of the chest abdomen and pelvis given all of her symptoms, and to followup on her lung cancer. The gallbladder was contracted and thickened and looked abnormal on the CT scan so a HIDA scan was performed and it came back normal including ejection fraction. I have already scheduled a surgery appointment with Dr. Gerrit Friends she has had pending about a week and a half. Her long changes are stable, there is no good evidence for recurrent of her cancer at this point the Is not having abdominal pain now though she says on the one hand she is better but she still feels terrible. She says that she has nausea in the morning but abates by the afternoon. She is not taking any acid suppressive therapy but if she does she'll use and Pepcid AC because that the only thing that helps and that she can tolerate. She does not vomit.  The thing that bothers her most is fatigue. She says she has a sore and she does not sleep well though she falls asleep in the evening while watching TV and then has trouble going to that. She saw Dr. Kriste Basque yesterday. She was tender in her right upper quadrant. He also complained about weakness and dyspnea, and he suggested trying rehabilitation again though she said that was only some money and time is depressing because she was to help his person there when she did cardiopulmonary rehabilitation. Medications, allergies, past medical history, past surgical history, family history and social history are reviewed and updated in the EMR.  Physical exam shows her to be obese, she is tender on the right costal margin but not in the  abdomen itself.   Assessment and plan:  1. Nausea   2. Fatigue   3. Abnormal CT scan, gallbladder    1. I admitted to the patient and I really don't understand were all of her problems are coming from. I don't see any specific GI problem. 2. I explained that her gallbladder looked abnormal on the CT scan though ultrasound in May and now the HIDA scan don't show any problems. It is possible she has some sort of intermittent gallbladder problem but based upon her symptoms at this point it seems unlikely to be a gallbladder problem. 3. She will keep her appointment with surgery, I think it will be worthwhile to get Dr. Ardine Eng opinion and input though I explained I would not expect him to recommend cholecystectomy but we will see. 4. She snores and doesn't sleep well and is tired. She could have sleep apnea. Sleep study could make sense. She says she would not wear CPAP mask though. Then she said maybe. 5. I think I met the limit of what I can do to help this lady. Fortunately haven't turned up any serious problems. He said an EGD, colonoscopy, and over the past year abdominal ultrasound, CT of the abdomen pelvis, and a HIDA scan with ejection fraction. At this point she has fairly vague symptoms with the nausea and a lot of fatigue. She denies depression, perhaps she has deconditioning (likely). She declines and supervised cardiopulmonary rehabilitation  but I told her to discuss this with her cardiologist again some of her medications could be at play as well.  I will wait on surgery opinion but anticipate followup as needed only. I think she needs to do something to help her self more including considering cardiopulmonary rehabilitation and considering a sleep study Dr.Nadel thinks it is reasonable. However if she says there is no she would use a CPAP mask then doubly not sensible to pursue testing.  CC: Michele Mcalpine, MD and Darnell Level, MD

## 2011-11-04 ENCOUNTER — Ambulatory Visit (HOSPITAL_BASED_OUTPATIENT_CLINIC_OR_DEPARTMENT_OTHER): Payer: Medicare Other | Admitting: Internal Medicine

## 2011-11-04 ENCOUNTER — Other Ambulatory Visit: Payer: Self-pay | Admitting: Internal Medicine

## 2011-11-04 ENCOUNTER — Telehealth: Payer: Self-pay | Admitting: Internal Medicine

## 2011-11-04 VITALS — BP 118/65 | HR 61 | Temp 96.8°F | Resp 20 | Ht 61.0 in | Wt 183.1 lb

## 2011-11-04 DIAGNOSIS — I4891 Unspecified atrial fibrillation: Secondary | ICD-10-CM

## 2011-11-04 DIAGNOSIS — C3490 Malignant neoplasm of unspecified part of unspecified bronchus or lung: Secondary | ICD-10-CM

## 2011-11-04 DIAGNOSIS — J449 Chronic obstructive pulmonary disease, unspecified: Secondary | ICD-10-CM

## 2011-11-04 DIAGNOSIS — C3410 Malignant neoplasm of upper lobe, unspecified bronchus or lung: Secondary | ICD-10-CM

## 2011-11-04 DIAGNOSIS — R0602 Shortness of breath: Secondary | ICD-10-CM

## 2011-11-04 NOTE — Telephone Encounter (Signed)
appts made and printed for pt aom °

## 2011-11-04 NOTE — Patient Instructions (Signed)
Your CT scan showed no evidence for disease progression Followup in 6 months with repeat CT scan of the chest 

## 2011-11-04 NOTE — Progress Notes (Signed)
[facility] [phone]   [phone]  OFFICE PROGRESS NOTE  Michele Mcalpine, MD [facility] [address] [place] [address]  PRINCIPAL DIAGNOSES:  1. 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 2. History of stage IA (T1, N0, MX) non-small cell lung cancer, adenocarcinoma with bronchoalveolar features diagnosed in November 2005 involving the right upper lobe.  PRIOR THERAPY:  1. Status post right upper lobectomy on January 03, 2004. 2. Status post wedge resection of the left upper lobe on February 06, 2009 under the care of Dr. Edwyna Shell.  CURRENT THERAPY: Observation.  INTERVAL HISTORY: [name] 77 y.o. female returns to the clinic today for routine six-month followup visit. The patient is feeling fine today with no specific complaints except for shortness breath increased with exertion. The patient denied having any significant chest pain, cough or hemoptysis. She denied having any significant weight loss or night sweats. She was recently found on CT scan of the abdomen to have questionable cholecystitis and she had a HIDA scan performed which showed normal gallbladder. She has repeat CT scan of the chest performed recently and she is here for evaluation and discussion of her scan results.  MEDICAL HISTORY: Past Medical History  Diagnosis Date  . Asthma   . COPD (chronic obstructive pulmonary disease)   . History of lung cancer   . Hypertension   . Paroxysmal atrial fibrillation   . Peripheral vascular disease   . Venous insufficiency   . Hyperlipidemia   . Diabetes mellitus   . Nontoxic multinodular goiter   . Obesity   . GERD (gastroesophageal reflux disease)   . History of nephrolithiasis   . History of bladder cancer   . DJD (degenerative joint disease)   . Osteopenia   . Anxiety   . History of colon polyps 07/233/2009    Tubular Adenomas  . Diverticulosis of sigmoid  colon     mild  . Gastritis     moderate  . Hemorrhoids, internal   . Claustrophobia   . Thyroid nodule   . Chronic anticoagulation     Eliquis    ALLERGIES:  is allergic to cephalexin; codeine; epinephrine; erythromycin; morphine; and sulfamethoxazole w-trimethoprim.  MEDICATIONS:  Current Outpatient Prescriptions  Medication Sig Dispense Refill  . albuterol (PROVENTIL HFA) 108 (90 BASE) MCG/ACT inhaler Inhale 2 puffs into the lungs every 6 (six) hours as needed.        [name] apixaban (ELIQUIS) 5 MG TABS tablet Take 1 tablet (5 mg total) by mouth 2 (two) times daily.  60 tablet  3  . Biotin 5000 MCG TABS Take 1 tablet by mouth daily.      . cetirizine (ZYRTEC) 10 MG tablet Take 10 mg by mouth as needed.       . Coenzyme Q10 (COQ10) 200 MG CAPS Take 1 capsule by mouth daily.      . Cyanocobalamin (VITAMIN B 12 PO) Take by mouth. 1000 mcg once a day       . DIOVAN HCT 160-12.5 MG per tablet TAKE 1 TABLET BY MOUTH ONCE DAILY  30 tablet  10  . FENOGLIDE 120 MG TABS TAKE 1 TABLET BY MOUTH ONCE DAILY  30 tablet  0  . Fluticasone-Salmeterol (ADVAIR DISKUS) 500-50 MCG/DOSE AEPB Inhale 1 puff into the lungs 2 (two) times daily.  14 each  0  . furosemide (LASIX) 20 MG tablet take 1 tablet by  mouth once daily if needed for SWELLING  30 tablet  PRN  . metFORMIN (GLUCOPHAGE) 500 MG tablet take 1 tablet by mouth every morning  30 tablet  6  . metoprolol tartrate (LOPRESSOR) 25 MG tablet 1/2 tablet twice a day      . Multiple Vitamin (MULTIVITAMIN PO) Take by mouth. Once a day       . Omega-3 Fatty Acids (FISH OIL) 1200 MG CAPS Take by mouth. Once a day      . Probiotic Product (ALIGN PO) Take 1 tablet by mouth daily.      [name] tiotropium (SPIRIVA HANDIHALER) 18 MCG inhalation capsule Place 1 capsule (18 mcg total) into inhaler and inhale daily.  10 capsule  0  . traMADol (ULTRAM) 50 MG tablet Take 1 tablet (50 mg total) by mouth 3 (three) times daily as needed.  90 tablet  5  . verapamil (VERELAN PM)  360 MG 24 hr capsule Take 1 capsule (360 mg total) by mouth at bedtime.  30 capsule  6  . Vitamin D, Ergocalciferol, (DRISDOL) 50000 UNITS CAPS take 1 capsule by mouth every week  4 capsule  12  . ondansetron (ZOFRAN-ODT) 4 MG disintegrating tablet Take 4 mg by mouth every 8 (eight) hours as needed.       Current Facility-Administered Medications  Medication Dose Route Frequency Provider Last Rate Last Dose  . 0.9 %  sodium chloride infusion  500 mL Intravenous Continuous Iva Boop, MD        SURGICAL HISTORY:  Past Surgical History  Procedure Date  . S/p right rotator cuff repair 1991    Dr. Meade Maw, right  . S/p turbt 10/01    Dr. Vonita Moss  . S/p left tkr 2002    Dr. Darrelyn Hillock  . 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 S/p d&c for removal of endometrial polyp (benign) 12/10    GYN  . Colonoscopy   . Esophagogastroduodenoscopy     REVIEW OF SYSTEMS:  A comprehensive review of systems was negative except for: Respiratory: positive for dyspnea on exertion   PHYSICAL EXAMINATION: General appearance: alert, cooperative and no distress Head: Normocephalic, without obvious abnormality, atraumatic Neck: no adenopathy Lymph nodes: Cervical, supraclavicular, and axillary nodes normal. Resp: clear to auscultation bilaterally Cardio: regular rate and rhythm, S1, S2 normal, no murmur, click, rub or gallop GI: soft, non-tender; bowel sounds normal; no masses,  no organomegaly Extremities: extremities normal, atraumatic, no cyanosis or edema  ECOG PERFORMANCE STATUS: 1 - Symptomatic but completely ambulatory  Blood pressure 118/65, pulse 61, temperature 96.8 F (36 C), temperature source Oral, resp. rate 20, height 5\' 1"  (1.549 m), weight 183 lb 1.6 oz (83.054 kg).  LABORATORY DATA: Lab Results  Component Value Date    WBC 10.8* 10/14/2011   HGB 15.6* 10/14/2011   HCT 47.0* 10/14/2011   MCV 95.9 10/14/2011   PLT 254.0 10/14/2011      Chemistry      Component Value Date/Time   NA 142 10/14/2011 1647   K 4.2 10/14/2011 1647   CL 106 10/14/2011 1647   CO2 26 10/14/2011 1647   BUN 23 10/14/2011 1647   CREATININE 1.0 10/14/2011 1647      Component Value Date/Time   CALCIUM 10.0 10/14/2011 1647   ALKPHOS 29* 10/29/2011 1125  AST 26 10/29/2011 1125   ALT 23 10/29/2011 1125   BILITOT 0.7 10/29/2011 1125       RADIOGRAPHIC STUDIES: Ct Chest W Contrast  10/18/2011  *RADIOLOGY REPORT*  Clinical Data:  History of lung cancer diagnosed in 2005 and bladder cancer diagnosed in 2001.  Status post right upper lobectomy and left upper lobectomy.  Dyspepsia and abdominal pain. Shortness of breath.  CT CHEST, ABDOMEN AND PELVIS WITH CONTRAST  Technique:  Multidetector CT imaging of the chest, abdomen and pelvis was performed following the standard protocol during bolus administration of intravenous contrast.  Contrast: OMNIPAQUE IOHEXOL 300 MG/ML  SOLN  Comparison:  Chest CT 04/30/2011.  PET CT 01/03/2009.   CT CHEST  Findings:  Mediastinum: Heart size is normal. Small amount of pericardial fluid and/or thickening overlying the apex of the left ventricle, unchanged compared to the prior examination, of doubtful clinical significance. There is atherosclerosis of the thoracic aorta, the great vessels of the mediastinum and the coronary arteries, including calcified atherosclerotic plaque in the left anterior descending, left circumflex and right coronary arteries. No pathologically enlarged mediastinal or hilar lymph nodes. Esophagus is unremarkable in appearance.  Thyroid gland is mildly enlarged and heterogeneous in appearance (unchanged).  Lungs/Pleura: Status post bilateral upper lobectomy.  Similar mild postoperative scarring in the lungs bilaterally.  No suspicious appearing pulmonary nodules or masses.  Tiny 6 mm focus of ground- glass  attenuation in the superior aspect of the left lower lobe (image 16 of series 4) is unchanged.  No acute consolidative airspace disease.  No pleural effusions.  Musculoskeletal: There are no aggressive appearing lytic or blastic lesions noted in the visualized portions of the skeleton.  IMPRESSION:  1.  Status post bilateral upper lobectomy is without evidence to suggest local recurrence of disease or new metastatic disease in the thorax.  The appearance of chest is very similar to the prior study from 04/30/2011, as detailed above.   CT ABDOMEN AND PELVIS  Findings:  Abdomen/Pelvis: The enhanced appearance of the liver, pancreas, spleen, bilateral adrenal glands and bilateral kidneys is unremarkable.  The gallbladder is nearly completely decompressed and appears have some mild wall thickening and edema.  There is extensive atherosclerosis of the abdominal and pelvic vasculature, including heavily calcified plaques at the origin of the superior mesenteric artery, celiac axis and left renal arteries.  These arteries all appear grossly patent, however, secondary to the severity of calcification at their ostia, assessment for hemodynamically significant stenosis is not possible on this examination (this was not protocoled as a CTA examination).  There is a possible short segment dissection or ulcerated plaque in the distal abdominal aorta best demonstrated on image 72 of series 2, which appears similar in retrospect to noncontrast images from PET CT 01/03/2009.  No ascites or pneumoperitoneum and no pathologic distension of bowel.  No definite pathologic lymphadenopathy identified within the abdomen or pelvis.  Small umbilical hernia containing only omental fat.  There is severe laxity of the levator ani musculature, particularly on the left side, and there is severe pelvic organ prolapse as evidenced by significant downward displacement of the urinary bladder, uterus and rectum well below the pubococcygeal line.   Musculoskeletal: There are no aggressive appearing lytic or blastic lesions noted in the visualized portions of the skeleton.  IMPRESSION:  1.  No evidence to suggest metastatic disease to the abdomen or pelvis. 2.  The gallbladder is nearly completely decompressed, however, there appears to be some gallbladder wall thickening and  edema. This could suggest some chronic inflammation.  Clinical correlation is recommended, with consideration for further evaluation with HIDA scan if clinically indicated.  No evidence of cholelithiasis. 3.  Extensive atherosclerosis of the abdominal and pelvic vasculature, as above.  Prominent calcified plaques at the origins of mesenteric arteries, including the celiac axis and SMA. Although there are no overt changes to suggest bowel ischemia at this time, if there is clinical suspicion for chronic bowel ischemia, further evaluation with dedicated CTA of the abdomen and pelvis may be warranted. 4.  Small umbilical hernia containing only omental fat. 5.  Severe resting pelvic organ prolapse, as above.   Original Report Authenticated By: Florencia Reasons, M.D.    Nm Hepato W/eject Fract  10/30/2011  *RADIOLOGY REPORT*  Clinical Data:  Right upper quadrant pain, nausea  NUCLEAR MEDICINE HEPATOBILIARY IMAGING WITH GALLBLADDER EF:  Technique: Sequential images of the abdomen were obtained for 60 minutes following intravenous administration of radiopharmaceutical.  Patient then ingested 8 ounces of commercially available Ensure Plus and imaging was continued for 60 minutes.  A time-activity curve was generated from tracer within the gallbladder following Ensure Plus ingestion, and the gallbladder ejection fraction was calculated.  Radiopharmaceutical:  5.3 mCi Tc-27m mebrofenin  Comparison: None  Findings: Prompt tracer extraction from bloodstream, indicating normal hepatocellular function. Prompt excretion of tracer into biliary tree. Gallbladder visualized at 14 minutes. Small bowel  activity not visualized until following fatty meal stimulation. No hepatic retention of tracer.  Subjectively normal emptying of tracer from gallbladder following fatty meal stimulation. Calculated gallbladder ejection fraction is 70%, normal. Patient experienced no symptoms following fatty meal ingestion.  IMPRESSION: Normal exam.  Normal values for gallbladder ejection fraction: > 30% for exams utilizing sincalide (CCK) > 33% for exams utilizing fatty meal stimulation with Ensure Plus   Original Report Authenticated By: Lollie Marrow, M.D.     ASSESSMENT: This is a very pleasant 77 years old white female with history of recurrent non-small cell lung cancer last resection was in January of 2011. The patient is doing fine and she has no evidence for disease progression on his recent scan.  PLAN: I discussed the scan results with the patient. I recommended for her to continue on observation for now with repeat CT scan of the chest with contrast in 6 months. She was advised to call me immediately she has any concerning symptoms in the interval.  She continues to have dyspnea at baseline and increased with exertion this most likely secondary to COPD and recently diagnosed atrial fibrillation. She will continue to followup with her pulmonologist Dr. Kriste Basque as well as Dr. Johney Frame her cardiologist.   All questions were answered. The patient knows to call the clinic with any problems, questions or concerns. We can certainly see the patient much sooner if necessary.

## 2011-11-11 ENCOUNTER — Ambulatory Visit (INDEPENDENT_AMBULATORY_CARE_PROVIDER_SITE_OTHER): Payer: Medicare Other | Admitting: Internal Medicine

## 2011-11-11 ENCOUNTER — Encounter: Payer: Self-pay | Admitting: Internal Medicine

## 2011-11-11 ENCOUNTER — Encounter (INDEPENDENT_AMBULATORY_CARE_PROVIDER_SITE_OTHER): Payer: Self-pay | Admitting: Surgery

## 2011-11-11 ENCOUNTER — Ambulatory Visit (INDEPENDENT_AMBULATORY_CARE_PROVIDER_SITE_OTHER): Payer: Medicare Other | Admitting: Surgery

## 2011-11-11 VITALS — BP 134/58 | HR 68 | Temp 97.1°F | Resp 16 | Ht 62.0 in | Wt 184.0 lb

## 2011-11-11 VITALS — BP 142/60 | HR 73 | Ht 62.0 in | Wt 183.8 lb

## 2011-11-11 DIAGNOSIS — R110 Nausea: Secondary | ICD-10-CM

## 2011-11-11 DIAGNOSIS — I10 Essential (primary) hypertension: Secondary | ICD-10-CM

## 2011-11-11 DIAGNOSIS — R0602 Shortness of breath: Secondary | ICD-10-CM

## 2011-11-11 DIAGNOSIS — I4891 Unspecified atrial fibrillation: Secondary | ICD-10-CM

## 2011-11-11 DIAGNOSIS — E785 Hyperlipidemia, unspecified: Secondary | ICD-10-CM

## 2011-11-11 MED ORDER — PROMETHAZINE HCL 12.5 MG PO TABS: 25.0000 mg | ORAL_TABLET | Freq: Four times a day (QID) | ORAL | Status: DC | PRN

## 2011-11-11 NOTE — Patient Instructions (Signed)
For your information only:  Labyrinthitis (Inner Ear Inflammation) Your exam shows you have an inner ear disturbance or labyrinthitis. The cause of this condition is not known. But it may be due to a virus infection. The symptoms of labyrinthitis include vertigo or dizziness made worse by motion, nausea and vomiting. The onset of labyrinthitis may be very sudden. It usually lasts for a few days and then clears up over 1-2 weeks. The treatment of an inner ear disturbance includes bed rest and medications to reduce dizziness, nausea, and vomiting. You should stay away from alcohol, tranquilizers, caffeine, nicotine, or any medicine your doctor thinks may make your symptoms worse. Further testing may be needed to evaluate your hearing and balance system. Please see your doctor or go to the emergency room right away if you have:  Increasing vertigo, earache, loss of hearing, or ear drainage.  Headache, blurred vision, trouble walking, fainting, or fever.  Persistent vomiting, dehydration, or extreme weakness. Document Released: 01/21/2005 Document Revised: 04/15/2011 Document Reviewed: 07/09/2006 [facility] Patient Information 2013 [place], [place].   [name], MD, [facility] Office: [phone]

## 2011-11-11 NOTE — Patient Instructions (Addendum)
Your physician recommends that you schedule a follow-up appointment in: 3 months with Sunday Spillers and 6 months with Dr Johney Frame  Your physician has requested that you have en exercise stress myoview. For further information please visit [email or web address] Please follow instruction sheet, as given.  Hold Fenoglide for 1 week if symptoms better stop if not restart

## 2011-11-11 NOTE — Progress Notes (Signed)
General Surgery [facility]  Chief Complaint  Patient presents with  . New Evaluation    eval n/v/upper abd pain - referral from Dr. Stan Head    HISTORY: Patient is a 77 year old white female previously followed in our practice for bilateral thyroid nodules. In May of 2013 the patient had rather sudden onset of nausea. She was initially treated with proton pump inhibitors without symptomatic improvement. She underwent a complete workup including upper endoscopy, colonoscopy, and CT scan of the abdomen. CT scan demonstrated a small contracted gallbladder but was otherwise normal. Patient was tried on Reglan, sucralfate, and Prilosec, all without symptomatic improvement.  Patient complains mainly of indigestion and nausea. She had an initial slight weight loss but has begun eating small meals with some improvement in her symptoms. She states she has never had emesis.  She denies any previous abdominal surgery. There is no family history of hepatobiliary disease or cholecystectomy. She denies jaundice. She denies acholic stools.  Further workup included abdominal ultrasound which showed a normal gallbladder and bile ducts. She also underwent a nuclear medicine hepatobiliary scan which was completely normal with an ejection fraction of 70% with a fatty meal.  Of note, the patient does note some difficulty with changing position. She notes becoming lightheaded when arising from a bent over position. She also notes that in the morning when she awakened she does not have nausea but when she changes position and comes to an upright position she develops nausea. She has not had any problems with inner year difficulties in the past, but that may be an item for further consideration if her gastrointestinal workup is unrevealing.  Past Medical History  Diagnosis Date  . Asthma   . COPD (chronic obstructive pulmonary disease)   . History of lung cancer   . Hypertension   .  Paroxysmal atrial fibrillation   . Peripheral vascular disease   . Venous insufficiency   . Hyperlipidemia   . Diabetes mellitus   . Nontoxic multinodular goiter   . Obesity   . GERD (gastroesophageal reflux disease)   . History of nephrolithiasis   . History of bladder cancer   . DJD (degenerative joint disease)   . Osteopenia   . Anxiety   . History of colon polyps 07/233/2009    Tubular Adenomas  . Diverticulosis of sigmoid colon     mild  . Gastritis     moderate  . Hemorrhoids, internal   . Claustrophobia   . Thyroid nodule   . Chronic anticoagulation     Eliquis     Current Outpatient Prescriptions  Medication Sig Dispense Refill  . albuterol (PROVENTIL HFA) 108 (90 BASE) MCG/ACT inhaler Inhale 2 puffs into the lungs every 6 (six) hours as needed.        [name] apixaban (ELIQUIS) 5 MG TABS tablet Take 1 tablet (5 mg total) by mouth 2 (two) times daily.  60 tablet  3  . Biotin 5000 MCG TABS Take 1 tablet by mouth daily.      . cetirizine (ZYRTEC) 10 MG tablet Take 10 mg by mouth as needed.       . Coenzyme Q10 (COQ10) 200 MG CAPS Take 1 capsule by mouth daily.      . Cyanocobalamin (VITAMIN B 12 PO) Take by mouth. 1000 mcg once a day       . DIOVAN HCT 160-12.5 MG per tablet TAKE 1 TABLET BY MOUTH ONCE DAILY  30 tablet  10  .  FENOGLIDE 120 MG TABS TAKE 1 TABLET BY MOUTH ONCE DAILY  30 tablet  0  . Fluticasone-Salmeterol (ADVAIR DISKUS) 500-50 MCG/DOSE AEPB Inhale 1 puff into the lungs 2 (two) times daily.  14 each  0  . furosemide (LASIX) 20 MG tablet take 1 tablet by mouth once daily if needed for SWELLING  30 tablet  PRN  . metFORMIN (GLUCOPHAGE) 500 MG tablet take 1 tablet by mouth every morning  30 tablet  6  . metoprolol tartrate (LOPRESSOR) 25 MG tablet 1/2 tablet twice a day      . Multiple Vitamin (MULTIVITAMIN PO) Take by mouth. Once a day       . Omega-3 Fatty Acids (FISH OIL) 1200 MG CAPS Take by mouth. Once a day      . ondansetron (ZOFRAN-ODT) 4 MG  disintegrating tablet Take 4 mg by mouth every 8 (eight) hours as needed.      . Probiotic Product (ALIGN PO) Take 1 tablet by mouth daily.      [name] tiotropium (SPIRIVA HANDIHALER) 18 MCG inhalation capsule Place 1 capsule (18 mcg total) into inhaler and inhale daily.  10 capsule  0  . traMADol (ULTRAM) 50 MG tablet Take 1 tablet (50 mg total) by mouth 3 (three) times daily as needed.  90 tablet  5  . verapamil (VERELAN PM) 360 MG 24 hr capsule Take 1 capsule (360 mg total) by mouth at bedtime.  30 capsule  6  . Vitamin D, Ergocalciferol, (DRISDOL) 50000 UNITS CAPS take 1 capsule by mouth every week  4 capsule  12   Current Facility-Administered Medications  Medication Dose Route Frequency Provider Last Rate Last Dose  . 0.9 %  sodium chloride infusion  500 mL Intravenous Continuous Iva Boop, MD         Allergies  Allergen Reactions  . Cephalexin     REACTION: nausea and diarrhea  . Codeine     REACTION: nausea  . Epinephrine     REACTION: nervous, convulsions  . Erythromycin     REACTION: all mycins cause yeast infections  . Morphine     REACTION: nausea  . Sulfamethoxazole W-Trimethoprim     REACTION: nausea and diarrhea     Family History  Problem Relation Age of Onset  . Asthma Mother   . Heart failure Mother   . Heart attack Father   . Ulcers Father   . Colon cancer Neg Hx      History   Social History  . Marital Status: Married    Spouse Name: N/A    Number of Children: 2  . Years of Education: N/A   Occupational History  . Retired     Social History Main Topics  . Smoking status: Former Smoker    Quit date: 02/04/1994  . Smokeless tobacco: Never Used  . Alcohol Use: Yes     Occasionally  . Drug Use: No  . Sexually Active: Not Currently   Other Topics Concern  . None   Social History Narrative   Daily caffeine      REVIEW OF SYSTEMS - PERTINENT POSITIVES ONLY: Persistent intermittent nausea occurring on a daily basis. No emesis. No  jaundice. No acholic stools.  EXAM: Filed Vitals:   11/11/11 1327  BP: 134/58  Pulse: 68  Temp: 97.1 F (36.2 C)  Resp: 16    HEENT: normocephalic; pupils equal and reactive; sclerae clear; dentition good; mucous membranes moist NECK:  symmetric on extension; no palpable anterior  or posterior cervical lymphadenopathy; no supraclavicular masses; no tenderness CHEST: clear to auscultation bilaterally without rales, rhonchi, or wheezes CARDIAC: regular rate and rhythm without significant murmur; peripheral pulses are full ABDOMEN: soft without distension; bowel sounds present; no mass; no hepatosplenomegaly; small umbilical hernia, reducible EXT:  non-tender without edema; no deformity NEURO: no gross focal deficits; no sign of tremor   LABORATORY RESULTS: See [facility] (CHL-Epic) for most recent results   RADIOLOGY RESULTS: See [facility] (CHL-Epic) for most recent results   IMPRESSION: #1 nausea of uncertain etiology; no gastrointestinal findings on a variety of studies #2 small umbilical hernia, reducible, asymptomatic  PLAN: The patient and I reviewed all of the above studies and findings at length. I am going to call in a prescription for Phenergan tablets for her to take when her symptoms are most severe. I have asked her to consider workup for her in her year problems as a possible etiology of her nausea if her gastrointestinal workup remains unrevealing.  The patient and I have discussed cholecystectomy and I do not think that is advisable given her comorbid problems and her lack of evidence showing abnormality of the gallbladder.  Patient will return to see me as needed.  [name], MD, FACS General & Endocrine Surgery [facility]   Visit Diagnoses: 1. Nausea in adult     Primary Care Physician: Michele Mcalpine, MD

## 2011-11-12 ENCOUNTER — Other Ambulatory Visit: Payer: Self-pay | Admitting: Pulmonary Disease

## 2011-11-15 ENCOUNTER — Other Ambulatory Visit: Payer: Self-pay | Admitting: Pulmonary Disease

## 2011-11-17 ENCOUNTER — Encounter: Payer: Self-pay | Admitting: Internal Medicine

## 2011-11-17 NOTE — Assessment & Plan Note (Signed)
Maintaining sinus rhythm Tolerating apixaban  No changes today

## 2011-11-17 NOTE — Assessment & Plan Note (Signed)
Unclear etiology myoview to exclude ischemia as a cause   Nausea could be due to fenoglide. I have stopped this medicine today.  If nausea subsides then we will look for an alternative medicine.  If her nausea does not improve then she should restart fenogline in 1 week

## 2011-11-17 NOTE — Assessment & Plan Note (Signed)
She will stop fenoglide as above

## 2011-11-17 NOTE — Assessment & Plan Note (Signed)
Likely due to prior lung disease Given age and comorbidities, I think that it is also necessary to evaluate for ischemia I will order a lexiscan myoview

## 2011-11-17 NOTE — Progress Notes (Signed)
PCP:  Michele Mcalpine, MD  The patient presents today for routine cardiology followup.  Since last being seen by Norma Fredrickson, the patient reports doing reasonably well.  She has ongoing and frequent nausea.  She also reports SOB/ dyspnea with moderate activity.  Today, she denies symptoms of palpitations, chest pain,orthopnea, PND, lower extremity edema, dizziness, presyncope, syncope, or neurologic sequela.  The patient feels that she is tolerating medications without difficulties and is otherwise without complaint today.    Past Medical History  Diagnosis Date  . Asthma   . COPD (chronic obstructive pulmonary disease)   . History of lung cancer   . Hypertension   . Paroxysmal atrial fibrillation   . Peripheral vascular disease   . Venous insufficiency   . Hyperlipidemia   . Diabetes mellitus   . Nontoxic multinodular goiter   . Obesity   . GERD (gastroesophageal reflux disease)   . History of nephrolithiasis   . History of bladder cancer   . DJD (degenerative joint disease)   . Osteopenia   . Anxiety   . History of colon polyps 07/233/2009    Tubular Adenomas  . Diverticulosis of sigmoid colon     mild  . Gastritis     moderate  . Hemorrhoids, internal   . Claustrophobia   . Thyroid nodule   . Chronic anticoagulation     Eliquis   Past Surgical History  Procedure Date  . S/p right rotator cuff repair 1991    Dr. Meade Maw, right  . S/p turbt 10/01    Dr. Vonita Moss  . S/p left tkr 2002    Dr. Darrelyn Hillock  . 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 S/p d&c for removal of endometrial polyp (benign) 12/10    GYN  . Colonoscopy   . Esophagogastroduodenoscopy     Current Outpatient Prescriptions  Medication Sig Dispense Refill  . albuterol (PROVENTIL HFA) 108 (90 BASE) MCG/ACT inhaler Inhale 2 puffs into the  lungs every 6 (six) hours as needed.        [name] apixaban (ELIQUIS) 5 MG TABS tablet Take 1 tablet (5 mg total) by mouth 2 (two) times daily.  60 tablet  3  . Biotin 5000 MCG TABS Take 1 tablet by mouth daily.      . cetirizine (ZYRTEC) 10 MG tablet Take 10 mg by mouth as needed.       . Coenzyme Q10 (COQ10) 200 MG CAPS Take 1 capsule by mouth daily.      . Cyanocobalamin (VITAMIN B 12 PO) Take by mouth. 1000 mcg once a day       . FENOGLIDE 120 MG TABS TAKE 1 TABLET BY MOUTH ONCE DAILY  30 tablet  0  . Fluticasone-Salmeterol (ADVAIR DISKUS) 500-50 MCG/DOSE AEPB Inhale 1 puff into the lungs 2 (two) times daily.  14 each  0  . furosemide (LASIX) 20 MG tablet take 1 tablet by mouth once daily if needed for SWELLING  30 tablet  PRN  . metFORMIN (GLUCOPHAGE) 500 MG tablet take 1 tablet by mouth every morning  30 tablet  6  . metoprolol tartrate (LOPRESSOR) 25 MG tablet 1/2 tablet twice a day      . Multiple Vitamin (MULTIVITAMIN PO) Take by mouth. Once a day       .  Omega-3 Fatty Acids (FISH OIL) 1200 MG CAPS Take by mouth. Once a day      . ondansetron (ZOFRAN-ODT) 4 MG disintegrating tablet Take 4 mg by mouth every 8 (eight) hours as needed.      . Probiotic Product (ALIGN PO) Take 1 tablet by mouth daily.      . promethazine (PHENERGAN) 12.5 MG tablet Take 2 tablets (25 mg total) by mouth every 6 (six) hours as needed for nausea.  30 tablet  0  . tiotropium (SPIRIVA HANDIHALER) 18 MCG inhalation capsule Place 1 capsule (18 mcg total) into inhaler and inhale daily.  10 capsule  0  . traMADol (ULTRAM) 50 MG tablet Take 1 tablet (50 mg total) by mouth 3 (three) times daily as needed.  90 tablet  5  . verapamil (VERELAN PM) 360 MG 24 hr capsule Take 1 capsule (360 mg total) by mouth at bedtime.  30 capsule  6  . Vitamin D, Ergocalciferol, (DRISDOL) 50000 UNITS CAPS take 1 capsule by mouth every week  4 capsule  12  . ADVAIR DISKUS 500-50 MCG/DOSE AEPB inhale 1 dose by mouth twice a day  60 each  3  .  valsartan-hydrochlorothiazide (DIOVAN-HCT) 160-12.5 MG per tablet TAKE 1 TABLET BY MOUTH ONCE DAILY  30 tablet  11   Current Facility-Administered Medications  Medication Dose Route Frequency Provider Last Rate Last Dose  . 0.9 %  sodium chloride infusion  500 mL Intravenous Continuous Iva Boop, MD        Allergies  Allergen Reactions  . Cephalexin     REACTION: nausea and diarrhea  . Codeine     REACTION: nausea  . Epinephrine     REACTION: nervous, convulsions  . Erythromycin     REACTION: all mycins cause yeast infections  . Morphine     REACTION: nausea  . Sulfamethoxazole W-Trimethoprim     REACTION: nausea and diarrhea    History   Social History  . Marital Status: Married    Spouse Name: N/A    Number of Children: 2  . Years of Education: N/A   Occupational History  . Retired     Social History Main Topics  . Smoking status: Former Smoker    Quit date: 02/04/1994  . Smokeless tobacco: Never Used  . Alcohol Use: Yes     Occasionally  . Drug Use: No  . Sexually Active: Not Currently   Other Topics Concern  . Not on file   Social History Narrative   Daily caffeine     Family History  Problem Relation Age of Onset  . Asthma Mother   . Heart failure Mother   . Heart attack Father   . Ulcers Father   . Colon cancer Neg Hx     ROS-  All systems are reviewed and are negative except as outlined in the HPI above   Physical Exam: Filed Vitals:   11/11/11 1516  BP: 142/60  Pulse: 73  Height: 5\' 2"  (1.575 m)  Weight: 183 lb 12.8 oz (83.371 kg)  SpO2: 95%    GEN- The patient is elderly appearing, alert and oriented x 3 today.   Head- normocephalic, atraumatic Eyes-  Sclera clear, conjunctiva pink Ears- hearing intact Oropharynx- clear Neck- supple, no JVP Lymph- no cervical lymphadenopathy Lungs- Clear to ausculation bilaterally, normal work of breathing Heart- Regular rate and rhythm, no murmurs, rubs or gallops, PMI not laterally  displaced GI- soft, NT, ND, + BS Extremities- no clubbing,  cyanosis, or edema MS- no significant deformity or atrophy Skin- no rash or lesion Psych- euthymic mood, full affect Neuro- strength and sensation are intact  ekg today reveals sinus rhythm 63 bpm, otherwise normal ekg 10/15/11 echo results reviewed in detail with patient today  Assessment and Plan:

## 2011-11-17 NOTE — Assessment & Plan Note (Signed)
Stable No change required today  

## 2011-11-20 ENCOUNTER — Ambulatory Visit (HOSPITAL_COMMUNITY): Payer: Medicare Other | Attending: Cardiology | Admitting: Radiology

## 2011-11-20 VITALS — BP 121/54 | Ht 62.0 in | Wt 182.0 lb

## 2011-11-20 DIAGNOSIS — R0609 Other forms of dyspnea: Secondary | ICD-10-CM | POA: Insufficient documentation

## 2011-11-20 DIAGNOSIS — R0989 Other specified symptoms and signs involving the circulatory and respiratory systems: Secondary | ICD-10-CM | POA: Insufficient documentation

## 2011-11-20 DIAGNOSIS — R0602 Shortness of breath: Secondary | ICD-10-CM | POA: Insufficient documentation

## 2011-11-20 DIAGNOSIS — Z8249 Family history of ischemic heart disease and other diseases of the circulatory system: Secondary | ICD-10-CM | POA: Insufficient documentation

## 2011-11-20 DIAGNOSIS — E119 Type 2 diabetes mellitus without complications: Secondary | ICD-10-CM | POA: Insufficient documentation

## 2011-11-20 DIAGNOSIS — J45909 Unspecified asthma, uncomplicated: Secondary | ICD-10-CM | POA: Insufficient documentation

## 2011-11-20 DIAGNOSIS — R002 Palpitations: Secondary | ICD-10-CM | POA: Insufficient documentation

## 2011-11-20 DIAGNOSIS — I10 Essential (primary) hypertension: Secondary | ICD-10-CM | POA: Insufficient documentation

## 2011-11-20 DIAGNOSIS — I739 Peripheral vascular disease, unspecified: Secondary | ICD-10-CM | POA: Insufficient documentation

## 2011-11-20 MED ORDER — AMINOPHYLLINE 25 MG/ML IV SOLN
75.0000 mg | Freq: Once | INTRAVENOUS | Status: AC
  Administered 2011-11-20: 75 mg via INTRAVENOUS

## 2011-11-20 MED ORDER — TECHNETIUM TC 99M SESTAMIBI GENERIC - CARDIOLITE
10.0000 | Freq: Once | INTRAVENOUS | Status: AC | PRN
  Administered 2011-11-20: 10 via INTRAVENOUS

## 2011-11-20 MED ORDER — TECHNETIUM TC 99M SESTAMIBI GENERIC - CARDIOLITE
30.0000 | Freq: Once | INTRAVENOUS | Status: AC | PRN
  Administered 2011-11-20: 30 via INTRAVENOUS

## 2011-11-20 MED ORDER — REGADENOSON 0.4 MG/5ML IV SOLN
0.4000 mg | Freq: Once | INTRAVENOUS | Status: AC
  Administered 2011-11-20: 0.4 mg via INTRAVENOUS

## 2011-11-20 NOTE — Progress Notes (Signed)
[facility] SITE 3 NUCLEAR MED [id number] [place] [address] [phone]  Cardiology Nuclear Med Study  [name] is a 77 y.o. female     MRN : [id number]     DOB: 25-Jun-1934  Procedure Date: 11/20/2011  Nuclear Med Background Indication for Stress Test:  Evaluation for Ischemia History:  Asthma, COPD and PAF, 2009 MPS: (-) ischemia breast attenuation EF: 80%, 10/2011: ECHO: EF55-60%  Cardiac Risk Factors: Family History - CAD, History of Smoking, Hypertension, Lipids, NIDDM and PVD  Symptoms:  DOE, Nausea, Palpitations and SOB   Nuclear Pre-Procedure Caffeine/Decaff Intake:  None NPO After: 7:00pm   Lungs:  clear O2 Sat: 96% on room air. IV 0.9% NS with Angio Cath:  22g  IV Site: R Hand  IV Started by:  Bonnita Levan, RN  Chest Size (in):  38 Cup Size: B  Height: 5\' 2"  (1.575 m)  Weight:  182 lb (82.555 kg)  BMI:  Body mass index is 33.29 kg/(m^2). Tech Comments:  Patient held AM meds. This patient had a bad headache with Lexiscan. She was given coke, without fixing it. Aminophylline 75 mg IV given and all symptoms resolved.    Nuclear Med Study 1 or 2 day study: 1 day  Stress Test Type:  Lexiscan  Reading MD: Cassell Clement, MD  Order Authorizing Provider:  Hillis Range, MD  Resting Radionuclide: Technetium 61m Sestamibi  Resting Radionuclide Dose: 11.0 mCi   Stress Radionuclide:  Technetium 4m Sestamibi  Stress Radionuclide Dose: 33.0 mCi           Stress Protocol Rest HR: 53 Stress HR: 71  Rest BP: 121/54 Stress BP: 117/51  Exercise Time (min): n/a METS: n/a   Predicted Max HR: 145 bpm % Max HR: 48.97 bpm Rate Pressure Product: 8307   Dose of Adenosine (mg):  n/a Dose of Lexiscan: 0.4 mg  Dose of Atropine (mg): n/a Dose of Dobutamine: n/a mcg/kg/min (at max HR)  Stress Test Technologist: Milana Na, EMT-P  Nuclear Technologist:  Domenic Polite, CNMT     Rest Procedure:  Myocardial perfusion imaging was  performed at rest 45 minutes following the intravenous administration of Technetium 66m Sestamibi. Rest ECG: Sinus Bradycardy  Stress Procedure:  The patient received IV Lexiscan 0.4 mg over 15-seconds.  Technetium 41m Sestamibi injected at 30-seconds.  There were no significant changes, sob, headache, and occ pacs with Lexiscan.  Quantitative spect images were obtained after a 45 minute delay. Stress ECG: No significant change from baseline ECG  QPS Raw Data Images:  Normal; no motion artifact; normal heart/lung ratio. Stress Images:  Normal homogeneous uptake in all areas of the myocardium. Rest Images:  Normal homogeneous uptake in all areas of the myocardium. Subtraction (SDS):  No evidence of ischemia. Transient Ischemic Dilatation (Normal <1.22):  1.10 Lung/Heart Ratio (Normal <0.45):  0.30  Quantitative Gated Spect Images QGS EDV:  71 ml QGS ESV:  18 ml  Impression Exercise Capacity:  Lexiscan with no exercise. BP Response:  Normal blood pressure response. Clinical Symptoms:  There is dyspnea. ECG Impression:  No significant ST segment change suggestive of ischemia. Comparison with Prior Nuclear Study: No images to compare  Overall Impression:  Normal stress nuclear study.  LV Ejection Fraction: 74%.  LV Wall Motion:  NL LV Function; NL Wall Motion  [employer]

## 2011-11-25 ENCOUNTER — Other Ambulatory Visit: Payer: Self-pay | Admitting: Pulmonary Disease

## 2011-11-28 ENCOUNTER — Telehealth: Payer: Self-pay | Admitting: Internal Medicine

## 2011-11-28 NOTE — Telephone Encounter (Signed)
**Note De-Identified Darl Kuss Obfuscation** Pt's last OV with Dr. Johney Frame was on 10-7 and this procedure was not planned at that time. Please advise.

## 2011-11-28 NOTE — Telephone Encounter (Signed)
New problem:  need to come off Eliquis  5 day prior to breast biopsy  schedule on 10/31.

## 2011-11-29 ENCOUNTER — Telehealth: Payer: Self-pay | Admitting: Pulmonary Disease

## 2011-11-29 ENCOUNTER — Other Ambulatory Visit: Payer: Self-pay | Admitting: Pulmonary Disease

## 2011-11-29 NOTE — Telephone Encounter (Signed)
Pt needs to be made aware that SN out of the office until 10/28 ATC, NA and no option to leave a msg [facility]

## 2011-12-01 NOTE — Telephone Encounter (Signed)
5 days would be too long to hold this medicine. Please hold 2 days prior to the procedure and resume 24 hours after the procedure

## 2011-12-02 MED ORDER — ALPRAZOLAM 0.25 MG PO TABS: ORAL_TABLET | ORAL | Status: DC

## 2011-12-02 NOTE — Telephone Encounter (Signed)
Xanax not even on her med list  LMTCB

## 2011-12-02 NOTE — Telephone Encounter (Signed)
Follow-up:    Called in wanting to know what Dr. Jenel Lucks recommendations for the patient's Eliquis for an upcomming procedure.  Please call back.

## 2011-12-02 NOTE — Telephone Encounter (Signed)
Spoke with the pt and she is requesting a refill on xanax. Pt states her last RX was written 2 years ago, that is why it is not on her current med list. Last rx was for xanax 0.25mg  1/2 tablet to 1 tablet bid prn.  Please advise if ok to send in refill. Carron Curie, CMA

## 2011-12-02 NOTE — Telephone Encounter (Signed)
Per SN -- ok to give refill of the xanax 0.25 mg   #60  Take 1/2 to 1 po bid prn nerves with 5 refills. thanks

## 2011-12-02 NOTE — Telephone Encounter (Signed)
Rx has been called in. Pt is aware. 

## 2011-12-05 ENCOUNTER — Other Ambulatory Visit: Payer: Self-pay | Admitting: Radiology

## 2011-12-20 ENCOUNTER — Other Ambulatory Visit: Payer: Self-pay | Admitting: Pulmonary Disease

## 2011-12-25 ENCOUNTER — Other Ambulatory Visit: Payer: Self-pay | Admitting: Pulmonary Disease

## 2011-12-25 ENCOUNTER — Telehealth: Payer: Self-pay | Admitting: Nurse Practitioner

## 2011-12-25 NOTE — Telephone Encounter (Signed)
New Problem: ° ° ° °Patient returned your call.  Please call back. °

## 2011-12-25 NOTE — Telephone Encounter (Signed)
Need more information. She needs to let us know what her blood pressure is doing and how her rhythm has been.

## 2011-12-25 NOTE — Telephone Encounter (Signed)
New Problem:    Patient called in because she has been feeling unusually tired and believes that it may be due to her verapamil (VERELAN PM) 360 MG 24 hr capsule and valsartan-hydrochlorothiazide (DIOVAN-HCT) 160-12.5 MG per tablet.  Please call back.

## 2011-12-26 NOTE — Telephone Encounter (Signed)
t/w pt feeling fine today, t/w Norma Fredrickson, NP we will just leave it at that and told pt call us if you need [place]

## 2011-12-27 ENCOUNTER — Encounter: Payer: Self-pay | Admitting: Pulmonary Disease

## 2012-01-19 ENCOUNTER — Other Ambulatory Visit: Payer: Self-pay | Admitting: Pulmonary Disease

## 2012-02-10 ENCOUNTER — Encounter: Payer: Self-pay | Admitting: Nurse Practitioner

## 2012-02-10 ENCOUNTER — Ambulatory Visit (INDEPENDENT_AMBULATORY_CARE_PROVIDER_SITE_OTHER): Payer: Medicare Other | Admitting: Nurse Practitioner

## 2012-02-10 VITALS — BP 110/56 | HR 60 | Ht 62.0 in | Wt 182.6 lb

## 2012-02-10 DIAGNOSIS — I480 Paroxysmal atrial fibrillation: Secondary | ICD-10-CM

## 2012-02-10 DIAGNOSIS — I4891 Unspecified atrial fibrillation: Secondary | ICD-10-CM

## 2012-02-10 NOTE — Patient Instructions (Addendum)
Stay on your current medicines but you need to take the Eliquis two times a day  Dr. Johney Frame will see you in 6 months  Call the [facility] office at [phone] if you have any questions, problems or concerns.

## 2012-02-10 NOTE — Progress Notes (Signed)
[name] Date of Birth: 03/03/1934 Medical Record #[id number]  History of Present Illness: [name] is seen back today for a follow up visit. She is seen for Dr. Johney Frame. She has had PAF and was seen back in September with recurrent atrial fib with RVR. She was placed on Lopressor and her Verapamil was increased. She was started on Eliquis. Her CHADs is at least a 34 (age, HTN, DM). An echo was done which showed good LV function. She has moderate hypertrophy and mild LAE and MR. She was noted to be back in sinus when she had the echo. She was seen by Dr. Johney Frame in October. Referred for Lexiscan to evaluate for ischemia at that visit. Other issues are as noted below. She has also had issues with chronic nausea and has seen GI and General Surgery with no clear cut etiology.   She comes in today. She is here alone. She remains chronically short of breath. She is otherwise doing ok. She still has issues with her nausea. No diagnosis found. She is frustrated and trying to get in to see someone else. May have to go out of town. No chest pain. Breathing is probably a little worse. She coughs.  She knows she needs to lose weight. Her rhythm has been ok. She has on her own cut her beta blocker back to just once a day and has cut her Eliquis back to just once a day.    Current Outpatient Prescriptions on File Prior to Visit  Medication Sig Dispense Refill  . ADVAIR DISKUS 500-50 MCG/DOSE AEPB inhale 1 dose by mouth twice a day  60 each  3  . ALPRAZolam (XANAX) 0.25 MG tablet Take 1/2 to 1 tablet BID PRN  60 tablet  5  . apixaban (ELIQUIS) 5 MG TABS tablet Take 5 mg by mouth daily.      . Biotin 5000 MCG TABS Take 1 tablet by mouth daily.      . cetirizine (ZYRTEC) 10 MG tablet Take 10 mg by mouth as needed.       . Coenzyme Q10 (COQ10) 200 MG CAPS Take 1 capsule by mouth daily.      . Cyanocobalamin (VITAMIN B 12 PO) Take by mouth. 1000 mcg once a day       . furosemide (LASIX) 20 MG tablet take 1  tablet by mouth once daily if needed for SWELLING  30 tablet  PRN  . metFORMIN (GLUCOPHAGE) 500 MG tablet take 1 tablet by mouth every morning  30 tablet  6  . methocarbamol (ROBAXIN) 500 MG tablet take 1 tablet by mouth three times a day if needed  90 tablet  3  . metoprolol tartrate (LOPRESSOR) 25 MG tablet Take 12.5 mg by mouth daily.       . Multiple Vitamin (MULTIVITAMIN PO) Take by mouth. Once a day       . Omega-3 Fatty Acids (FISH OIL) 1200 MG CAPS Take by mouth. Once a day      . ondansetron (ZOFRAN-ODT) 4 MG disintegrating tablet Take 4 mg by mouth every 8 (eight) hours as needed.      [name] PROAIR HFA 108 (90 BASE) MCG/ACT inhaler inhale 1 to 2 puffs by mouth every 4 hours if needed for wheezing  1 Inhaler  11  . Probiotic Product (ALIGN PO) Take 1 tablet by mouth daily.      . promethazine (PHENERGAN) 12.5 MG tablet Take 2 tablets (25 mg total) by mouth every  6 (six) hours as needed for nausea.  30 tablet  0  . SPIRIVA HANDIHALER 18 MCG inhalation capsule INHALE THE CONTENTS OF 1 CAPSULE VIA HANDIHALER ONCE DAILY.  30 each  11  . valsartan-hydrochlorothiazide (DIOVAN-HCT) 160-12.5 MG per tablet TAKE 1 TABLET BY MOUTH ONCE DAILY  30 tablet  11  . verapamil (VERELAN PM) 360 MG 24 hr capsule Take 1 capsule (360 mg total) by mouth at bedtime.  30 capsule  6  . Vitamin D, Ergocalciferol, (DRISDOL) 50000 UNITS CAPS take 1 capsule by mouth every week  4 capsule  12   Current Facility-Administered Medications on File Prior to Visit  Medication Dose Route Frequency Provider Last Rate Last Dose  . 0.9 %  sodium chloride infusion  500 mL Intravenous Continuous Iva Boop, MD        Allergies  Allergen Reactions  . Cephalexin     REACTION: nausea and diarrhea  . Codeine     REACTION: nausea  . Epinephrine     REACTION: nervous, convulsions  . Erythromycin     REACTION: all mycins cause yeast infections  . Morphine     REACTION: nausea  . Sulfamethoxazole W-Trimethoprim     REACTION:  nausea and diarrhea    Past Medical History  Diagnosis Date  . Asthma   . COPD (chronic obstructive pulmonary disease)   . History of lung cancer   . Hypertension   . Paroxysmal atrial fibrillation   . Peripheral vascular disease   . Venous insufficiency   . Hyperlipidemia   . Diabetes mellitus   . Nontoxic multinodular goiter   . Obesity   . GERD (gastroesophageal reflux disease)   . History of nephrolithiasis   . History of bladder cancer   . DJD (degenerative joint disease)   . Osteopenia   . Anxiety   . History of colon polyps 07/233/2009    Tubular Adenomas  . Diverticulosis of sigmoid colon     mild  . Gastritis     moderate  . Hemorrhoids, internal   . Claustrophobia   . Thyroid nodule   . Chronic anticoagulation     Eliquis    Past Surgical History  Procedure Date  . S/p right rotator cuff repair 1991    Dr. Meade Maw, right  . S/p turbt 10/01    Dr. Vonita Moss  . S/p left tkr 2002    Dr. Darrelyn Hillock  . 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 S/p d&c for removal of endometrial polyp (benign) 12/10    GYN  . Colonoscopy   . Esophagogastroduodenoscopy     History  Smoking status  . Former Smoker  . Quit date: 02/04/1994  Smokeless tobacco  . Never Used    History  Alcohol Use  . Yes    Comment: Occasionally    Family History  Problem Relation Age of Onset  . Asthma Mother   . Heart failure Mother   . Heart attack Father   . Ulcers Father   . Colon cancer Neg Hx     Review of Systems: The review of systems is per the HPI.  All other systems were reviewed and are negative.  Physical Exam: BP 110/56  Pulse 60  Ht 5\' 2"  (1.575 m)  Wt 182 lb 9.6 oz (82.827 kg)  BMI 33.40 kg/m2  SpO2 94% Patient is very pleasant and in no acute distress. She is short of breath with exertion. She  is coughing. Skin is warm and dry. Color is normal.  HEENT is unremarkable. Normocephalic/atraumatic. PERRL. Sclera are nonicteric. Neck is supple. No masses. No JVD. Lungs are fairly clear. Cardiac exam shows a regular rate and rhythm. She does have a few ectopics. Abdomen is obese but soft. Extremities are without edema. Gait and ROM are intact. No gross neurologic deficits noted.   LABORATORY DATA:  Lab Results  Component Value Date   WBC 10.8* 10/14/2011   HGB 15.6* 10/14/2011   HCT 47.0* 10/14/2011   PLT 254.0 10/14/2011   GLUCOSE 92 10/14/2011   CHOL 183 10/29/2011   TRIG 126.0 10/29/2011   HDL 36.90* 10/29/2011   LDLDIRECT 158.9 04/24/2011   LDLCALC 121* 10/29/2011   ALT 23 10/29/2011   AST 26 10/29/2011   NA 142 10/14/2011   K 4.2 10/14/2011   CL 106 10/14/2011   CREATININE 1.0 10/14/2011   BUN 23 10/14/2011   CO2 26 10/14/2011   TSH 0.68 08/15/2011   INR 0.94 10/02/2010   HGBA1C 6.7* 04/24/2011   Echo Study Conclusions  - Left ventricle: The cavity size was normal. There was moderate concentric hypertrophy. Systolic function was normal. The estimated ejection fraction was in the range of 55% to 60%. Wall motion was normal; there were no regional wall motion abnormalities. - Mitral valve: Mild regurgitation. - Left atrium: The atrium was mildly dilated. - Atrial septum: No defect or patent foramen ovale was identified. - Pulmonary arteries: PA peak pressure: 31mm Hg (S). - Pericardium, extracardiac: A trivial pericardial effusion was identified posterior to the heart.  Myoview Impression   Exercise Capacity: Lexiscan with no exercise.  BP Response: Normal blood pressure response.  Clinical Symptoms: There is dyspnea.  ECG Impression: No significant ST segment change suggestive of ischemia.  Comparison with Prior Nuclear Study: No images to compare   Overall Impression: Normal stress nuclear study.  LV Ejection Fraction: 74%. LV Wall Motion: NL LV Function; NL Wall Motion   Thomas  Brackbill   Assessment / Plan: 1. PAF - maintaining sinus. On Eliquis. I have explained to her that in order for it to work she needs to be taking it 2 times a day. She says she will go back to a BID dosing schedule.   2. HTN - BP diary from home is ok for the most part. I have left her on her current regimen.   3. Chronic SOB - most likely from known lung disease, prior lung surgery, etc. She knows she needs to lose weight.  4. Chronic nausea - she is asking about switching doctors, being referred out of town, etc. I have asked her to speak with Dr. Kriste Basque.  We will see her back in 6 months.   Patient is agreeable to this plan and will call if any problems develop in the interim.

## 2012-02-12 ENCOUNTER — Ambulatory Visit: Payer: Medicare Other | Admitting: Nurse Practitioner

## 2012-02-21 ENCOUNTER — Encounter: Payer: Self-pay | Admitting: Pulmonary Disease

## 2012-02-28 ENCOUNTER — Encounter: Payer: Self-pay | Admitting: Adult Health

## 2012-02-28 ENCOUNTER — Ambulatory Visit (INDEPENDENT_AMBULATORY_CARE_PROVIDER_SITE_OTHER): Payer: Medicare Other | Admitting: Adult Health

## 2012-02-28 VITALS — BP 126/64 | HR 85 | Temp 98.3°F | Ht 62.0 in | Wt 194.6 lb

## 2012-02-28 DIAGNOSIS — J4489 Other specified chronic obstructive pulmonary disease: Secondary | ICD-10-CM

## 2012-02-28 DIAGNOSIS — J449 Chronic obstructive pulmonary disease, unspecified: Secondary | ICD-10-CM

## 2012-02-28 DIAGNOSIS — L989 Disorder of the skin and subcutaneous tissue, unspecified: Secondary | ICD-10-CM

## 2012-02-28 MED ORDER — AMOXICILLIN-POT CLAVULANATE 875-125 MG PO TABS: 1.0000 | ORAL_TABLET | Freq: Two times a day (BID) | ORAL | Status: AC

## 2012-02-28 MED ORDER — PREDNISONE 10 MG PO TABS: ORAL_TABLET | ORAL | Status: DC

## 2012-02-28 MED ORDER — HYDROCODONE-HOMATROPINE 5-1.5 MG/5ML PO SYRP: 5.0000 mL | ORAL_SOLUTION | Freq: Four times a day (QID) | ORAL | Status: AC | PRN

## 2012-02-28 NOTE — Assessment & Plan Note (Addendum)
Exacerbation -recurrent  Depo Medrol 120mg  x 1  Xopenex neb in office   Plan  Augmentin 875mg  Twice daily  For 7 days  Steroid taper over next week.  Depo Medrol shot today  Mucinex DM Twice daily  As needed  Cough/congestion  Begin  Pepcid 20mg  Twice daily   Hydromet 1 tsp every 4-6 hr As needed  Cough  , may make you sleepy.  We are setting you up for a oxygen check at night.  Follow up Dr. Kriste Basque  In 1 month    Please contact office for sooner follow up if symptoms do not improve or worsen or seek emergency care

## 2012-02-28 NOTE — Progress Notes (Signed)
Subjective:    Patient ID: [name], female    DOB: February 03, 1935, 78 y.o.   MRN: [id number]  HPI 78 y/o WF    ~  September 25, 2009:  167mo ROV- c/o cough "at least 2 times per day" & tongue sore... she has had follow ups w/ her specialists:  DrRamos 5/11 for LBP to right leg & given epid steroid shot (improved)...  DrBurney 6/11 CXR stable & CT planned in Aug, she had some dypnea which he related to the amt of lung tissue resected...  DrGerkin 6/11 for bilat thyroid nodules w/ benign bx- f/u sonar w/ multinod goiter, no ch in nodules, TSH= WNL.[name].  she has f/u appt w/ DrMohammed in several weeks...  ~  December 25, 2009:  Add-on appt for "sinus"- c/o right eye problem "it's allergy" & notes red, swollen, right sided facial pain & HA;  notes drainage "it pours" mucus, congestion; hurts in her teeth down to her jaw, but denies fever, discolored phlegm or blood... she states this is a recurrent problem "every 15yrs" & had prev evals from dentist, eye doctor, & neurology (told it was rheumatism of a ganglion of her face)...  we discussed checking sinus XRays & treating her w/ Depo/ Pred, Augmentin, Mucinex >> if symptoms persist she will need Neuro f/u for ?atypic facial pain?  ~  April 12, 2010:  She notes mult somatic concerns> SOB- "it's my weight", Cluster HAs- improved off wine "hopefully they are gone for the next 42yr cycle", right great toenail infection surg by podiatry & finally improved but reactions to Kelflex & Septra w/ nausea & diarrhea...     She had CT 3/12 per DrMohamed> COPD & post surg changes, <1cm ground glass nodule LUL w/o change, stable/ NAD, hep steatosis;  they plan continued watchful waiting...    BP controlled on meds> 140/76 today & similar at home;  needs better diet & wt reduction to keep from having to incr her BP meds...    Chol looks fair on Fenoglide + FishOil as she is intol to all statins> TChol 215, TG 181, HDL 40, LDL 157...    DM control is adeq on the Metformin  alone>  Bs=140, A1c=6.8, but wt is up 11# to 197# & we reviewed low carb, low chol/fat diets...    Thyroid> TSH=1.08 (not on meds) & she reports being released by DrGerkins, no change x72yrs...    LABS 04/09/10 also shows Vit D still low at 24 ?on 2000u daily supplement> she prefers 50K weekly & we wrote Rx... we discussed Shingles vaccine for her at her convenience...  ~  October 04, 2010:  67mo ROV & post hosp visit>  She has had mult bronchitic exac over the interval, seen by TP w/ antibiotic & Pred rx; she has maintained a hectic personal life & travel schedule (she likes DepoMedrol shots prior to her trips;  Recent refractory AB episode w/o the usual improvement on Rx & reported choking on her dinner w/ incr dyspnea & hypoxemia requiring Hosp ==> responded slowly to Rx until she coughed up a chunk of onion w/ more rapid improvement after that;  disch on Pred taper, Dulera, Spiriva, Mucinex, etc; she was seen by speech path but no asp seen w/ any consistancy tested & rec for sm bites, chew carefully, swallow deliberately & slowly...     Since disch she's been stable (see meds below) & PFT today w/ severe airflow obstruction & FEV1=1.08 (56%), %1sec=48...  ~  December 05, 2010:  36mo ROV & she notes incr SOB w/ activ, & states she never got back to her prev baseline after last hosp & upset because she missed 2 trips; she notes that she hurt her back & hasn't been exercising because of this- since then incr SOB w/ some ADLs and incr edema in her legs;  She notes that she "can't get a deep breath" & she appears more anxious than usual w/ mult somatic complaints- indigestion, cramps in hands & feet, etc...  We discussed taking the Alprazolam 0.25mg  Tid regularly for her chest wall musc spasm so she can get a good deep breath & see how this works (she is asking about Accolate because a friend is on it & it really helped them)...    COPD w/ revers component> on Advair500 but only using it once/d (she stopped [facility]  stating it made her mouth sore), Spiriva, Mucinex, & Proventil HFA; asked (again) to maximize her regimen w/ AdvairBid, Mucinex2Bid, Fluids, etc; last PFT w/ FEV1=1.08 stable; see above...    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 NOTE: element of lung restriction from surg & obesity...    HBP> on DiovanHCT, Verapamil, & Lasix prn; BP= 144/80 & similar at home; she has mult somatic complaints...    Ven Insuffic> she knows to elim sodium but she eats out often, elev legs, wear support hose; she has been taking the Lasix20mg  daily due to the swelling & improved...     Hyperlipid> on Fenoglide120 & FishOil; she states INTOL to all statins; last FLP 8/12 in hosp as below; asked to ret for fasting blood work...    DM> on Metform500/d; BS=141, A1c=7.0; she will need more meds if she can't get on diet 7 get weight down...    Obesity> wt=198# which is up 12# in the last 36mo!!! We reviewed low carb, low fat, wt reducing diet 7 exercise prescription...    Multinod Goiter> prev eval from [place] DrGerkin; TFTs showed TSH=0.97, and FreeT3/ FreeT4 are wnl...    GI> GERD, Polyps> on Prilosec & had episode of choking on dinner w/ resp exac- see speech path eval; followed by DrGessner for GI w/ 13yr f/u colonoscopy due soon...    GU> Bladder Cancer> Papilllary TCCa resected 2001 & no recurrence; followed by DrPeterson w/ yearly cystos neg...    DJD/ LBP> on musc relaxer & Advil; prev rotator cuff surg 1991, left TKR 2002, now followed by DrRamos for LBP & plans shot in back (she reports it's improved spontaneously)...    Osteopenia> on Actonel, calcium, MVI, Vit D; ?when her last BMD was done?    Anxiety> as noted- she has Alpraz for prn use but doesn't take it often...  ~  April 30, 2011:  41mo ROV & [name] has  been stable w/ her severe lung disease as noted; unfortunately she still self-adjusts her meds despite my admonition to take meds regularly as prescribed for max benefit; she tells me she has "6 trips" coming up & she wants her "magic shot" meaning DepoMedrol; we have on numerous occas discussed the importance of minimizing the cortisone Rx & maximizing her bronchdilators etc; plus we have implored her to decrease her aggressive travel schedule fearing that she will have a resp exacerbation (w/ acute resp failure) [place]... See prob list below>> LABS 3/13:  FLP- not at goal on Feno120;  Chems- ok x BS=124 A1c=6.7 on Metform monotherapy...  ~  October 31, 2011:  34mo ROV & [name] states she decided to "get healthy" & hired a Systems analyst but during her 1st session she developed an irreg tachyarrhythmia & was Dx w/ AFib & rvr> seen by [employer] & DrAllred & treated w/ Eliquis5Bid, Metoprolol25-1/2Bid, along w/ her Verelan & DiovanHCT;  2DEcho showed modLVH, norm LVF w/ EF=55-60%, no regional wall motion abn, mildMR, mildLAdil, no ASD/PFO, PAsys=31;  Now improved & feeling better...    She was also being evaluated by DrGessner for nausea x39mo> she states nothing helps- on Zofran, Align, etc;  He did EGD/ Colon 6/13 w/ gastric polyps (hyperplastic), colon polyps (largest 30mm= tub adenoma), mild divertics, int hems;  He did AbdSonar- normal GB, no dilatation, +hepatic steatosis; Then CT Abd showed a decompressed GB, some wall thickening ?inflamm, and other findings like extensive atherosclerotic dis in abd, sm umbil hernia, & severe pelvic organ prolapse; finally HIDA scan normal; she tells me she has appt w/ DrGerkin to consider GB surg...    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 2) History of stage IA (T1, N0, MX) non-small cell lung cancer, adenocarcinoma with bronchoalveolar features diagnosed in November 2005 involving the right upper lobe. Prior Therapy: 1) Status post right upper lobectomy on January 03, 2004. 2) Status post wedge resection of the left upper lobe on February 06, 2009 under the care of Dr. Edwyna Shell. Current Therapy:  Observation    We reviewed prob list, meds, xrays and labs> see below for updates >> she will get the 2013 flu shot at [facility]...   02/28/2012 Acute OV  Complains of increased SOB, wheezing, tightness in chest "off and on since New Year's", worse x2 weeks.  Cant stop coughing . Cough is keeping her up at night. Delsym is not working.  No hemoptysis , edema or fever.  She was called in avelox 3 weeks ago , without much help. Called in prednisone taper but did not take.   She has multiple risk factors for OSA , denies daytime sleepiness. Or snoring.  We discussed again regarding sleep study , she delcines. But does agree to ONO .    Problem List:      ASTHMA (ICD-493.90) / COPD (ICD-496) - ex-smoker, quit 1997... Back on ADVAIR500 & SPIRIVA 1/d + MUCINEX 2Bid & PROVENTIL Prn... she was participating in [facility] (last 3/09) & stopped on her own... may have a superimposed component of restriction due to obesity & prev lung surgeries... ~  11/05: s/p RULobectomy for lung ca- (adeno w/ bronchoalveolar cell features). ~  PFT 8/08 showed FVC=2.02 (77%), FEV1=1.07 (51%), %1sec=53, mid-flows=19%pred. ~  PFT 7/09 today= FVC=1.93 (72%), FEV1=1.04 (49%), %1sec=54, mid-flows=19%pred. ~  1/11:  s/p LUL resection by DrBurney> multicentric bronchoalveolar cell cancer. ~  PFT 8/12 showed FVC=2.25 (88%), FEV1=1.08 (56%), %1sec=48, mid-flows=30% pred. ~  10/12:  She reports Dulera caused sore mouth so she switched back to Advair500 but asked to do this Bid regularly.  Hx of LUNG CANCER (ICD-162.9) - Hx multicentric  bronchoalveolar cell lung cancer >> followed by DrBurney for CVTS & DrMohammed for Oncology... ~  s/p right upper lobectomy by DrBurney 11/05 for a stage 1A non-small cell lung cancer (adenocarcinoma w/ bronchalveolar cell features); post-op observation only... ~  CT Chest 11/08 showed no recurrence, mult bilat nodules unchanged x3+ yrs, nodular thyroid w/o change... ~  CXR 7/09 showed stable post-op changes and scarring on the right, NAD.[name]. ~  CTAngio Chest 10/09 showed neg for PE, prom thyroid, atherosclerotic changes in Ao, no change in ground-glass nodules in LUL area... ~  CT Chest 11/10 by DrMohammed showed new LUL solid nodule, no change in ground-glass areas... lesion was PET pos... ~  1/11:  s/p LULobectomy & node dissection via minithoracotomy by DrBurney- path showed 3 foci of well diff bronchoalveolar cell ca & neg nodes... decision made at conference for no chemoRx, EGFR assay was neg... ~  she continues to have monitoring by DrBurney/ DrMohammed> on observation alone now; note from DrMohammed 9/12 indicated she was stable & he plans f/u CT Chest & OV in 47mo... ~  Last CT Chest 9/12 showed stable 9mm ground glass opac LUL w/o change from 3/12 scan; +post op changes, COPD, coronary calcif, & hep steatosis...  HYPERTENSION (ICD-401.9) - on DIOVAN/Hct 160-12.5 Daily, VERAPAMIL SR 360mg /d, & LASIX 20mg  "Prn"...  ~  Cardiac eval 9/09 by Walker Kehr was neg and 2DEcho showed DD w/ norm LVsys function, EF= 60-65%, no regional wall motion abn... ~  3/13:  BP= 122/72 & feeling OK, tolerating Rx (DiovHct 160/12.5 & Verelan240)... denies CP, palipit, dizziness, syncope, ch in dyspnea, edema, etc...  ~  9/13:  BP= 114/62 & meds adjusted in the interval w/ DiovHct 160/12.5 & Verelan360 (plus Metop25-1/2Bid now)...  PAROXYSMAL ATRIAL FIBRILLATION (ICD-427.31) - hx PAF in the post op period... converted to NSR & holding... transiently on Amiodarone in past & she wanted off Rx.  ~  Developed AFib w/ rvr  9/13 & eval by DrAllred> treated w/ Eliquis5Bid, Metoprolol25-1/2Bid, along w/ her Verelan & DiovanHCT... ~  2DEcho 9/13 showed modLVH, norm LVF w/ EF=55-60%, no regional wall motion abn, mildMR, mildLAdil, no ASD/PFO, PAsys=31  PERIPHERAL VASCULAR DISEASE (ICD-443.9) - she has atherosclerotic changes in her Ao noted on her prev scans...  ~  11/10: pt had mult questions about this problem on the prob list- discussed "hardening of the arteries" in detail.  VENOUS INSUFFICIENCY (ICD-459.81) - she knows to follow a low sodium diet, elevate legs, wear support hose, etc; she insists on keeping Lasix 20mg  on hand for Prn use...  HYPERLIPIDEMIA (ICD-272.4) - prev on Livolo 2mg - 1/2 tab daily (stopped due to aching), +FENOGLIDE 120mg /d, FISH OIL & CoQ10  supplements... prev on Vytorin but INTOL ==> she states INTOL to all statins... she was not satis w/ the Lipid Clinic in the past. ~  labs 8/08 off Vytorin showed TChol 183, TG 207, HDL 46, LDL 112... try fenofibrate... ~  FLP 5/09 on Feno120 showed TChol 188, TG 111, HDL 28, LDL 137... cont same, better diet, get wt down! ~  FLP 4/10 on Feno120 showed TChol 240, TG 104, HDL 49, LDL 165... I rec f/u Lipid Clinic, she declined. ~  FLP 7/10 on Feno120 showed TChol 222, TG 152, HDL 36, LDL 172... rec> try LIVALO 2mg - 1/2 tab Qhs, she stopped. ~  FLP 11/10 on Feno120+FishOil showed TChol 253, TG 125, HDL 42, LDL 208... try LIVALO2mg - 1/2 tab/d & stay on it. ~  FLP 1/11 on Liv1mg +Feno120 showed TChol 170, TG 101, HDL 45, LDL 105... continue same. ~  3/11: she reports aching all  over & DrBurney stopped the Livolo> contin diet + other meds. ~  FLP 8/11 showed TChol 225, TG 238, HDL 41, LDL 156... INTOL all statins, she'll do the best she can w/ diet. ~  FLP 8/12 in hosp showed TChol 234, TG 276, HDL 52, LDL 127... Counseled on low chol, low fat, wt reducing diet. ~  FLP 3/13 on Feno120 showed TChol 221, TG 85, HDL 56, LDL 159... She refuses Statin Rx or Lipid  Clinic referral...  DIABETES MELLITUS (ICD-250.00) - started on METFORMIN 500mg Bid 4/10, but decr on her own to 1 daily 1/11... we had stressed the importance of diet- low carb/ low fat and weight reduction, along w/ her pulm rehab exercises... ~  labs 4/10 showed BS= 131, HgA1c= 7.0.Marland KitchenMarland Kitchen Metformin500Bid started. ~  labs 7/10 showed BS= 134, A1c= 6.0 ~  labs 11/10 showed BS= 148, A1c= 6.5 ~  1/11:  she cut the Metformin to 1/d due to nausea. ~  labs 8/11 showed BS= 137, A1c= 5.7.Marland KitchenMarland Kitchen continue same, get wt down. ~  Labs 8/12 in hosp on Metform500/d showed BS= 110-230, A1c=6.5 ~  Labs 10/12 on Metform500/d showed BS= 141, A1c=7.0.Marland KitchenMarland Kitchen Needs better diet, get wt down, or more meds. ~  1/13:  Ophthalmology check up by DrMcCuen> neg- no diabetic retinopathy... ~  Labs 3/13 on Metform500/d showed BS= 124, A1c= 6.7  NONTOXIC MULTINODULAR GOITER (ICD-241.1) - eval and rx by DrBalan for Endocrinology & DrGerkin for CCS; she is asymptomatic; dominant nodule was needled and benign; surg consult from DrGerkin- elected observation & he checks her yearly... ~  seen 6/10 by DrGerkin- f/u sonar w/o change in any of the nodules... ~  labs 7/10 showed TSH= 0.89 ~  seen 6/11 by DrGerkin- f/u sonar w/o change in nodules. ~  Labs 8/12 showed TSH= 0.047... Not on Thyroid meds, needs f/u DrBalan (she never went). ~  Labs 10/12 showed TSH= 0.97, FreeT3= 3.2 (2.3-4.2), FreeT4= 0.88 (0.60-1.60)  OBESITY (ICD-278.00) - obese w/ abd panniculus & we discussed diet + exercise strategies.[name]. ~  weight up to 205# 11/09- we discussed diet, calorie restriction, exercise, & get the weight down... ~  weight 4/10 = 198# ~  weight 7/10 = 194# ~  weight 11/10 = 196# ~  weight 2/11 = 184# (post op) ~  weight 8/11 = 181# ~  weight 11/11 = 186#... she needs to do better! ~  Weight 8/12 = 186# ~  Weight 10/12 = 198#... What happened? ~  Weight 3/13 = 189#... Keep up the good work! ~  Weight 9/13 = 184#  GERD (ICD-530.81) - on  PRILOSEC 20mg /d... ~  EDG 6/13 by DrGessner showed mult gastric polyps (hyperplastic), mod gastritis, & treated w/ Omep20mg /d...  COLON POLYPS, DIVERTICULOSIS, HEMORRHOIDS >> ~  colonoscopy 7/09 by DrGessner showed 4 sm polyps= tubular adenoma on bx... f/u planned 3 yrs... ~  Colonoscopy 6/13 by DrGessner showed mult sm polyps, largest 39mm= tub adenoma, plus mild diverticvs, int hems, etc...  NEPHROLITHIASIS, HX OF (ICD-V13.01)  Hx of BLADDER CANCER (ICD-188.9) - had hematuria in 2001 & referred to DrPeterson... 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 all subseq cystoscopies have been neg- no recurrence. ~  she reports f/u w/ DrPeterson yearly & doing satis by her report.  DEGENERATIVE JOINT DISEASE (ICD-715.90) - s/p rotator cuff repair in 1991... s/p left TKR from DrGioffre in 2002...  OSTEOPENIA (ICD-733.90) - off prev ACTONEL 150/mo for a drug holiday, ca++,  MVI, etc... ~  I cannot find baseline BMD in EPIC or cone system> ?done by GYN? ~  labs 8/11 showed Vit D level = 22... rec> start Vit D supplement extra 02-1998 u daily... ~  2013:  Actonel removed from her list by DrGessner for drug holiday...  ANXIETY (ICD-300.00) - on XANAX 0.25mg  Prn... she has signif hx of claustrophobia, but denies that she has any anxiety...   Past Surgical History  Procedure Date  . S/p right rotator cuff repair 1991    Dr. Meade Maw, right  . S/p turbt 10/01    Dr. Vonita Moss  . S/p left tkr 2002    Dr. Darrelyn Hillock  . 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 S/p d&c for removal of endometrial polyp (benign) 12/10    GYN  . Colonoscopy   . Esophagogastroduodenoscopy     Outpatient Encounter Prescriptions as of 02/28/2012  Medication Sig Dispense Refill  . ADVAIR DISKUS 500-50  MCG/DOSE AEPB inhale 1 dose by mouth twice a day  60 each  3  . ALPRAZolam (XANAX) 0.25 MG tablet Take 1/2 to 1 tablet BID PRN  60 tablet  5  . apixaban (ELIQUIS) 5 MG TABS tablet Take 5 mg by mouth daily.      . Biotin 5000 MCG TABS Take 1 tablet by mouth daily.      . cetirizine (ZYRTEC) 10 MG tablet Take 10 mg by mouth as needed.       . Coenzyme Q10 (COQ10) 200 MG CAPS Take 1 capsule by mouth daily.      . Cyanocobalamin (VITAMIN B 12 PO) Take by mouth. 1000 mcg once a day       . furosemide (LASIX) 20 MG tablet take 1 tablet by mouth once daily if needed for SWELLING  30 tablet  PRN  . metFORMIN (GLUCOPHAGE) 500 MG tablet take 1 tablet by mouth every morning  30 tablet  6  . methocarbamol (ROBAXIN) 500 MG tablet take 1 tablet by mouth three times a day if needed  90 tablet  3  . metoprolol tartrate (LOPRESSOR) 25 MG tablet Take 12.5 mg by mouth daily.       . Multiple Vitamin (MULTIVITAMIN PO) Take by mouth. Once a day       . Omega-3 Fatty Acids (FISH OIL) 1200 MG CAPS Take by mouth. Once a day      . ondansetron (ZOFRAN-ODT) 4 MG disintegrating tablet Take 4 mg by mouth every 8 (eight) hours as needed.      [name] PROAIR HFA 108 (90 BASE) MCG/ACT inhaler inhale 1 to 2 puffs by mouth every 4 hours if needed for wheezing  1 Inhaler  11  . Probiotic Product (ALIGN PO) Take 1 tablet by mouth daily.      . promethazine (PHENERGAN) 12.5 MG tablet Take 2 tablets (25 mg total) by mouth every 6 (six) hours as needed for nausea.  30 tablet  0  . SPIRIVA HANDIHALER 18 MCG inhalation capsule INHALE THE CONTENTS OF 1 CAPSULE VIA HANDIHALER ONCE DAILY.  30 each  11  . valsartan-hydrochlorothiazide (DIOVAN-HCT) 160-12.5 MG per tablet TAKE 1 TABLET BY MOUTH ONCE DAILY  30 tablet  11  . verapamil (VERELAN PM) 360 MG 24 hr capsule Take 1 capsule (360 mg total) by mouth at bedtime.  30 capsule  6  . Vitamin D, Ergocalciferol, (DRISDOL) 50000 UNITS CAPS take 1 capsule by mouth every week  4 capsule  12    Facility-Administered Encounter Medications as of 02/28/2012  Medication Dose Route Frequency Provider Last Rate Last Dose  . 0.9 %  sodium chloride infusion  500 mL Intravenous Continuous Iva Boop, MD        Allergies                                          Pt states INTOL to ALL STATINS...  Allergen Reactions  . Cephalexin     REACTION: nausea and diarrhea  . Codeine     REACTION: nausea  . Epinephrine     REACTION: nervous  . Erythromycin     REACTION: all mycins cause yeast infections  . Morphine     REACTION: nausea  . Sulfamethoxazole W/Trimethoprim     REACTION: nausea and diarrhea    Current Medications, Allergies, Past Medical History, Past Surgical History, Family History, and Social History were reviewed in [employer] record.    Review of Systems         Constitutional:   No  weight loss, night sweats,  Fevers, chills +, fatigue, or  lassitude.  HEENT:   No headaches,  Difficulty swallowing,  Tooth/dental problems, or  Sore throat,                No sneezing, itching, ear ache, + nasal congestion, post nasal drip,   CV:  No chest pain,  Orthopnea, PND, swelling in lower extremities, anasarca, dizziness, palpitations, syncope.   GI  No heartburn, indigestion, abdominal pain, nausea, vomiting, diarrhea, change in bowel habits, loss of appetite, bloody stools.   Resp:    No coughing up of blood.   No chest wall deformity  Skin: no rash or lesions.  GU: no dysuria, change in color of urine, no urgency or frequency.  No flank pain, no hematuria   MS:  No joint pain or swelling.  No decreased range of motion.  No back pain.  Psych:  No change in mood or affect. No depression or anxiety.  No memory loss.       Objective:   Physical Exam     WD, WN, 78 y/o  WF in NAD... GENERAL:  Alert & oriented; pleasant & cooperative... HEENT:  [place]/AT, EOM-wnl, PERRLA, EACs-clear, TMs-wnl, NOSE-clear drainage THROAT-clear & wnl., class 2  airway  NECK:  Supple w/ fairROM; no JVD; normal carotid impulses w/o bruits; no thyromegaly or nodules palpated; no lymphadenopathy. CHEST:  Decr BS bilat; scat wheezing/ rhonchi bilat, no rales or consolidation; s/p VATS surg scar on right & mini thoracotomy scar on left.  HEART:  Regular Rhythm; without murmurs/ rubs/ or gallops heard... ABDOMEN:  Obese w/ panniculus, soft & nontender; normal bowel sounds; no organomegaly or masses detected. EXT:    mild arthritic changes; no varicose veins/ +venous insuffic/ tr edema.    Assessment & Plan:

## 2012-02-28 NOTE — Patient Instructions (Addendum)
Augmentin 875mg  Twice daily  For 7 days  Steroid taper over next week.  Depo Medrol shot today  Mucinex DM Twice daily  As needed  Cough/congestion  Begin  Pepcid 20mg  Twice daily   Hydromet 1 tsp every 4-6 hr As needed  Cough  , may make you sleepy.  We are setting you up for a oxygen check at night.  Follow up Dr. Kriste Basque  In 1 month    Please contact office for sooner follow up if symptoms do not improve or worsen or seek emergency care

## 2012-03-02 MED ORDER — METHYLPREDNISOLONE ACETATE 80 MG/ML IJ SUSP
120.0000 mg | Freq: Once | INTRAMUSCULAR | Status: AC
  Administered 2012-03-02: 120 mg via INTRAMUSCULAR

## 2012-03-02 MED ORDER — LEVALBUTEROL HCL 0.63 MG/3ML IN NEBU
0.6300 mg | INHALATION_SOLUTION | Freq: Once | RESPIRATORY_TRACT | Status: AC
  Administered 2012-03-02: 0.63 mg via RESPIRATORY_TRACT

## 2012-03-02 NOTE — Addendum Note (Signed)
Addended by: Boone Master E on: 03/02/2012 04:21 PM   Modules accepted: Orders

## 2012-03-02 NOTE — Addendum Note (Signed)
Addended by: Boone Master E on: 03/02/2012 11:41 AM   Modules accepted: Orders

## 2012-03-03 IMAGING — CR DG CHEST 2V
2 series · 2 of 2 positions shown · non-contrast
Comparison: Previous examinations, including the chest CT dated
04/10/2010 and chest radiographs dated 07/05/2009.

CLINICAL DATA: Bronchitis for the past 2 weeks with productive
cough.  Ex-smoker.  Asthma.

CHEST - 2 VIEW

[view not recorded (1 of 2)]
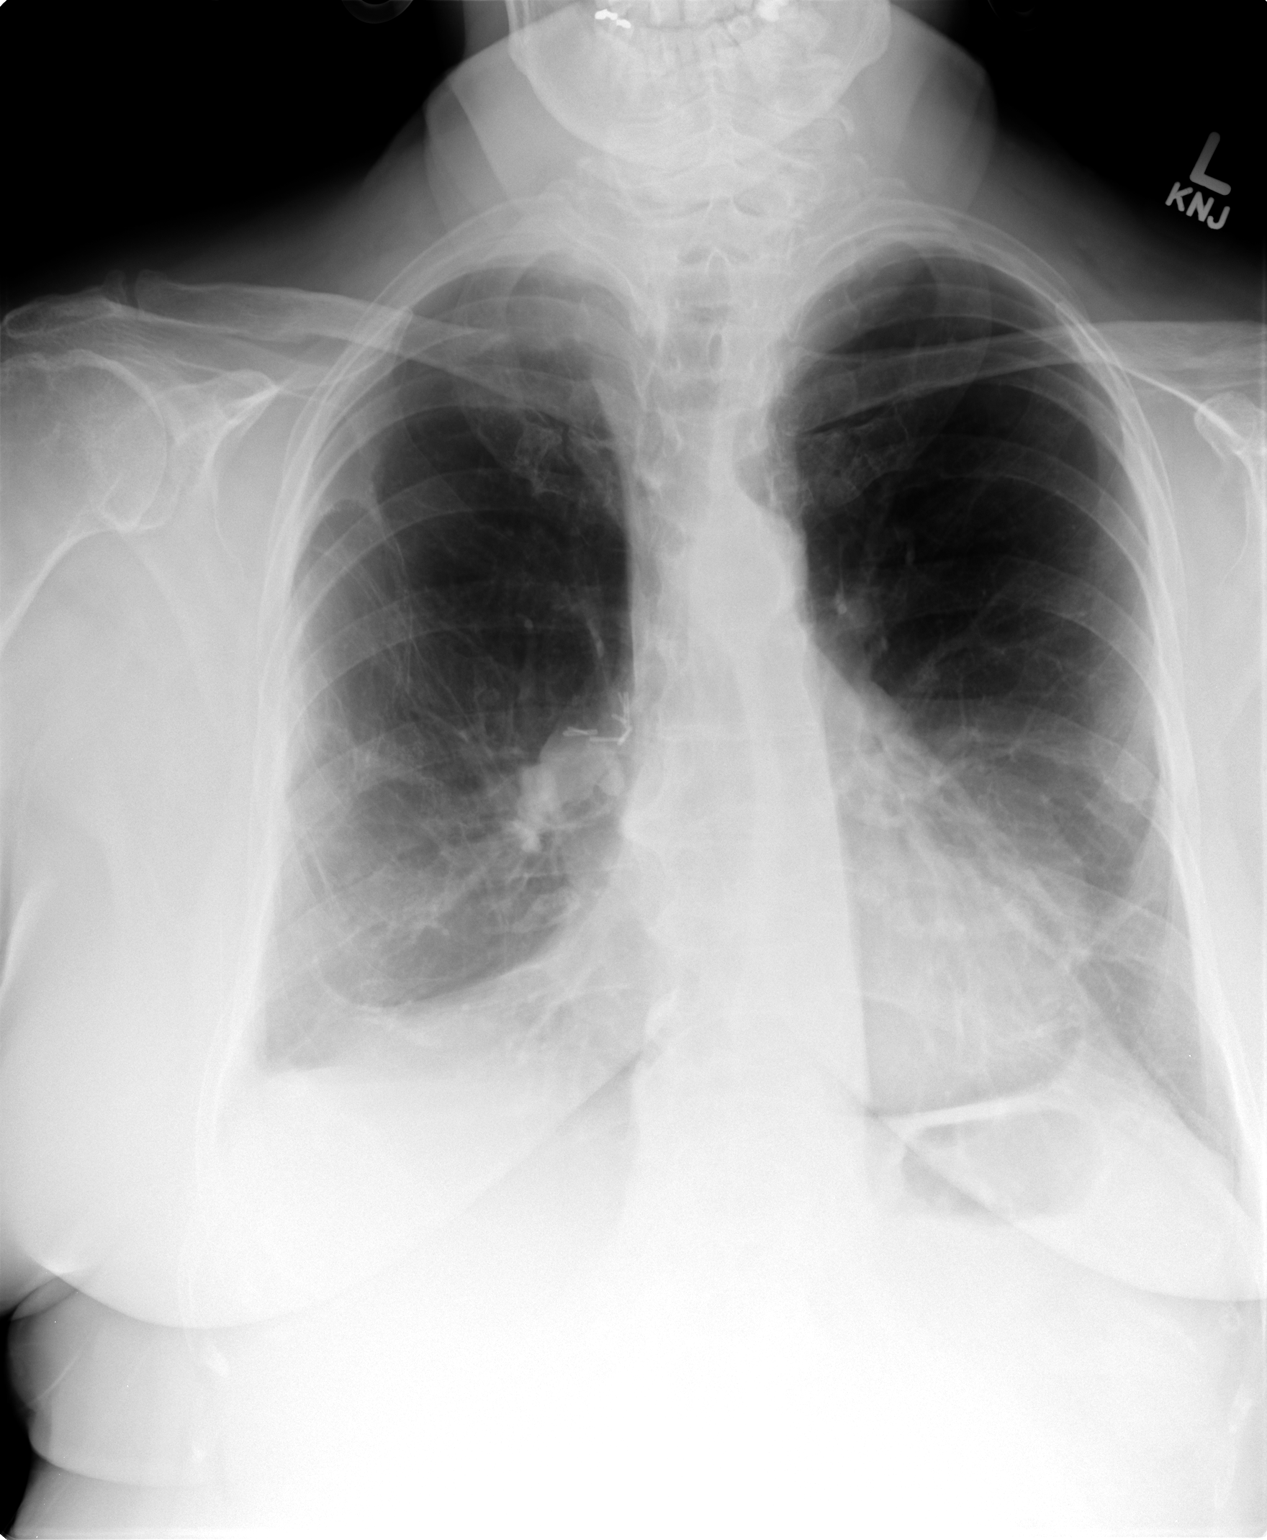

[view not recorded (2 of 2)]
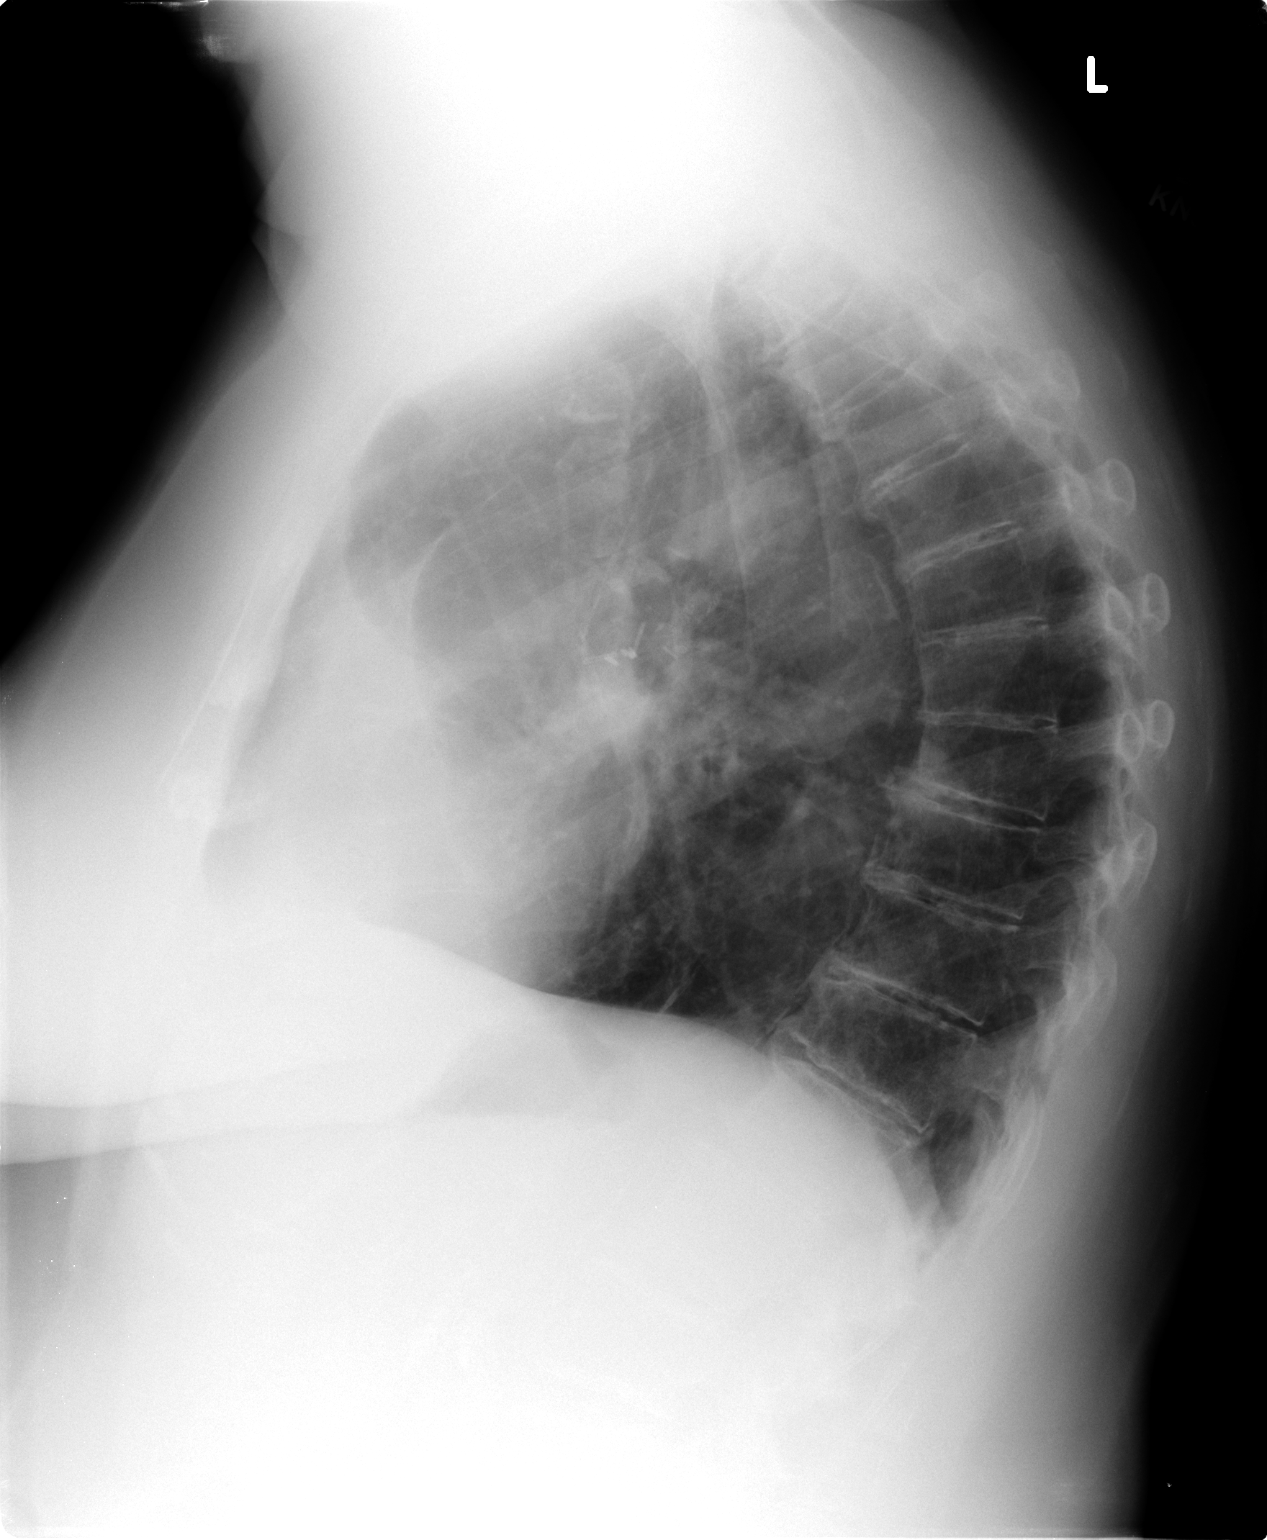

[2 of 2 positions shown; findings below may reference images not displayed]

FINDINGS: The cardiac silhouette remains borderline enlarged.  The
lungs remain mildly hyperexpanded.  Stable scarring and surgical
staples in the right upper lung zone, laterally.  Stable linear
scarring at both lung bases and right basilar pleural scarring.
Stable right hilar surgical clips and thoracic spine degenerative
changes.
IMPRESSION: Stable postoperative changes and changes of COPD.  No acute
abnormality.

## 2012-03-11 ENCOUNTER — Telehealth: Payer: Self-pay | Admitting: Internal Medicine

## 2012-03-11 NOTE — Telephone Encounter (Signed)
I spoke with the pt and she is scheduled for a routine dental cleaning on 03/19/12.  The pt's dentist instructed her to contact our office and determine if she needs to hold Eliquis prior to cleaning.  I made the pt aware that Dr Johney Frame is not in the office until 03/13/12. I will forward this message to [facility] to discuss with him at that time.

## 2012-03-11 NOTE — Telephone Encounter (Signed)
New Problem    Pt is having a general dental check up and cleaning 2/13. Pt wants to know if she needs to stop taking blood thinner, if so, when should she stop. Please call.

## 2012-03-13 ENCOUNTER — Encounter: Payer: Self-pay | Admitting: Cardiology

## 2012-03-13 NOTE — Telephone Encounter (Signed)
New Problem:    Patient called in wanting to know if she needed to hold her apixaban (ELIQUIS) 5 MG TABS tablet prior to a dental appointment on 03/19/12.  Please call back.

## 2012-03-13 NOTE — Telephone Encounter (Signed)
Paulene Floor 03/13/2012 4:20 PM Signed  New Problem:  Patient called in wanting to know if she needed to hold her apixaban (ELIQUIS) 5 MG TABS tablet prior to a dental appointment on 03/19/12. Please call back.

## 2012-03-13 NOTE — Telephone Encounter (Signed)
This encounter was created in error - please disregard.

## 2012-03-13 NOTE — Telephone Encounter (Signed)
Spoke with patient and let her know she did not need to hold for cleaning but if dentist wanted to she can hold one dose per Dr Johney Frame

## 2012-04-22 ENCOUNTER — Other Ambulatory Visit: Payer: Self-pay | Admitting: *Deleted

## 2012-04-24 ENCOUNTER — Encounter: Payer: Self-pay | Admitting: Internal Medicine

## 2012-04-24 ENCOUNTER — Telehealth: Payer: Self-pay | Admitting: Pulmonary Disease

## 2012-04-24 ENCOUNTER — Ambulatory Visit (INDEPENDENT_AMBULATORY_CARE_PROVIDER_SITE_OTHER): Payer: Medicare Other | Admitting: Internal Medicine

## 2012-04-24 VITALS — BP 120/80 | HR 106 | Temp 98.0°F | Ht 62.0 in | Wt 204.0 lb

## 2012-04-24 DIAGNOSIS — J45909 Unspecified asthma, uncomplicated: Secondary | ICD-10-CM

## 2012-04-24 DIAGNOSIS — R05 Cough: Secondary | ICD-10-CM

## 2012-04-24 MED ORDER — METHYLPREDNISOLONE ACETATE 80 MG/ML IJ SUSP
120.0000 mg | Freq: Once | INTRAMUSCULAR | Status: AC
  Administered 2012-04-24: 120 mg via INTRAMUSCULAR

## 2012-04-24 MED ORDER — AMOXICILLIN-POT CLAVULANATE 875-125 MG PO TABS: 1.0000 | ORAL_TABLET | Freq: Two times a day (BID) | ORAL | Status: DC

## 2012-04-24 MED ORDER — MOMETASONE FURO-FORMOTEROL FUM 100-5 MCG/ACT IN AERO: INHALATION_SPRAY | RESPIRATORY_TRACT | Status: DC

## 2012-04-24 NOTE — Telephone Encounter (Signed)
Spoke with pt and she could not finish her sentence without having to stop to rest first.  Pt was wheezing into the phone and coughing.  i advised the pt that she will need to call 911 and be evaluated in the ER.  Pt stated that she did not want to go to the ER and that she will be fine if she can just come in for a breathing tx and depo.  Pt is aware that SN and TP are out of the office today. I attempted to talk the pt into going to the ER for further eval of this SOB that came on all of a sudden today, but the pt refused again and stated that she felt that if she could make it home and rest she will be ok.  Spoke with SN and he is ok for the pt to see MW at 4 pm today.  Pt is aware. Nothing further is needed.

## 2012-04-24 NOTE — Assessment & Plan Note (Signed)
DDX of  difficult airways managment all start with A and  include Adherence, Ace Inhibitors, Acid Reflux, Active Sinus Disease, Alpha 1 Antitripsin deficiency, Anxiety masquerading as Airways dz,  ABPA,  allergy(esp in young), Aspiration (esp in elderly), Adverse effects of DPI,  Active smokers, plus two Bs  = Bronchiectasis and Beta blocker use..and one C= CHF  Adherence is always the initial "prime suspect" and is a multilayered concern that requires a "trust but verify" approach in every patient - starting with knowing how to use medications, especially inhalers, correctly, keeping up with refills and understanding the fundamental difference between maintenance and prns vs those medications only taken for a very short course and then stopped and not refilled. The proper method of use, as well as anticipated side effects, of a metered-dose inhaler are discussed and demonstrated to the patient. Improved effectiveness after extensive coaching during this visit to a level of approximately  50% so worth a trial of dulera 100 2bid  ? Acid reflux > max H1/ diet  ? Adverse effect of dpi > d/c advair  ? Sinus dz > 10 of augmentin in case her mucus turns green again  ? Anxiety > definitely present but not clear how much this is contributing.

## 2012-04-24 NOTE — Patient Instructions (Addendum)
Dulera 100 Take 2 puffs first thing in am and then another 2 puffs about 12 hours later this should help your asthma without irritating your throat like advair tends to  GERD (REFLUX)  is an extremely common cause of respiratory symptoms, many times with no significant heartburn at all.    It can be treated with medication, but also with lifestyle changes including avoidance of late meals, excessive alcohol, smoking cessation, and avoid fatty foods, chocolate, peppermint, colas, red wine, and acidic juices such as orange juice.  NO MINT OR MENTHOL PRODUCTS SO NO COUGH DROPS  USE SUGARLESS CANDY INSTEAD (jolley ranchers or Stover's)  NO OIL BASED VITAMINS - use powdered substitutes.    pepcid 20 mg about an hour after bfast and an hour after supper  Keep appt to see Dr Kriste Basque

## 2012-04-24 NOTE — Progress Notes (Signed)
Subjective:    Patient ID: [name], female    DOB: 10-09-1934, 78 y.o.   MRN: [id number]  HPI 78 y/o WF  Quit smoking with baseline = sob leaning over the cart to do grocery shopping.   ~  September 25, 2009:  78mo ROV- c/o cough "at least 2 times per day" & tongue sore... she has had follow ups w/ her specialists:  DrRamos 5/11 for LBP to right leg & given epid steroid shot (improved)...  DrBurney 6/11 CXR stable & CT planned in Aug, she had some dypnea which he related to the amt of lung tissue resected...  DrGerkin 6/11 for bilat thyroid nodules w/ benign bx- f/u sonar w/ multinod goiter, no ch in nodules, TSH= WNL.[name].  she has f/u appt w/ DrMohammed in several weeks...  ~  December 25, 2009:  Add-on appt for "sinus"- c/o right eye problem "it's allergy" & notes red, swollen, right sided facial pain & HA;  notes drainage "it pours" mucus, congestion; hurts in her teeth down to her jaw, but denies fever, discolored phlegm or blood... she states this is a recurrent problem "every 7yrs" & had prev evals from dentist, eye doctor, & neurology (told it was rheumatism of a ganglion of her face)...  we discussed checking sinus XRays & treating her w/ Depo/ Pred, Augmentin, Mucinex >> if symptoms persist she will need Neuro f/u for ?atypic facial pain?  ~  April 12, 2010:  She notes mult somatic concerns> SOB- "it's my weight", Cluster HAs- improved off wine "hopefully they are gone for the next 69yr cycle", right great toenail infection surg by podiatry & finally improved but reactions to Kelflex & Septra w/ nausea & diarrhea...     She had CT 3/12 per DrMohamed> COPD & post surg changes, <1cm ground glass nodule LUL w/o change, stable/ NAD, hep steatosis;  they plan continued watchful waiting...    BP controlled on meds> 140/76 today & similar at home;  needs better diet & wt reduction to keep from having to incr her BP meds...    Chol looks fair on Fenoglide + FishOil as she is intol to all statins>  TChol 215, TG 181, HDL 40, LDL 157...    DM control is adeq on the Metformin alone>  Bs=140, A1c=6.8, but wt is up 11# to 197# & we reviewed low carb, low chol/fat diets...    Thyroid> TSH=1.08 (not on meds) & she reports being released by DrGerkins, no change x44yrs...    LABS 04/09/10 also shows Vit D still low at 24 ?on 2000u daily supplement> she prefers 50K weekly & we wrote Rx... we discussed Shingles vaccine for her at her convenience...  ~  October 04, 2010:  44mo ROV & post hosp visit>  She has had mult bronchitic exac over the interval, seen by TP w/ antibiotic & Pred rx; she has maintained a hectic personal life & travel schedule (she likes DepoMedrol shots prior to her trips;  Recent refractory AB episode w/o the usual improvement on Rx & reported choking on her dinner w/ incr dyspnea & hypoxemia requiring Hosp ==> responded slowly to Rx until she coughed up a chunk of onion w/ more rapid improvement after that;  disch on Pred taper, Dulera, Spiriva, Mucinex, etc; she was seen by speech path but no asp seen w/ any consistancy tested & rec for sm bites, chew carefully, swallow deliberately & slowly...     Since disch she's been stable (see meds  below) & PFT today w/ severe airflow obstruction & FEV1=1.08 (56%), %1sec=48...  ~  December 05, 2010:  670mo ROV & she notes incr SOB w/ activ, & states she never got back to her prev baseline after last hosp & upset because she missed 2 trips; she notes that she hurt her back & hasn't been exercising because of this- since then incr SOB w/ some ADLs and incr edema in her legs;  She notes that she "can't get a deep breath" & she appears more anxious than usual w/ mult somatic complaints- indigestion, cramps in hands & feet, etc...  We discussed taking the Alprazolam 0.25mg  Tid regularly for her chest wall musc spasm so she can get a good deep breath & see how this works (she is asking about Accolate because a friend is on it & it really helped them)...    COPD w/  revers component> on Advair500 but only using it once/d (she stopped [facility] stating it made her mouth sore), Spiriva, Mucinex, & Proventil HFA; asked (again) to maximize her regimen w/ AdvairBid, Mucinex2Bid, Fluids, etc; last PFT w/ FEV1=1.08 stable; see above...    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 NOTE: element of lung restriction from surg & obesity...    HBP> on DiovanHCT, Verapamil, & Lasix prn; BP= 144/80 & similar at home; she has mult somatic complaints...    Ven Insuffic> she knows to elim sodium but she eats out often, elev legs, wear support hose; she has been taking the Lasix20mg  daily due to the swelling & improved...     Hyperlipid> on Fenoglide120 & FishOil; she states INTOL to all statins; last FLP 8/12 in hosp as below; asked to ret for fasting blood work...    DM> on Metform500/d; BS=141, A1c=7.0; she will need more meds if she can't get on diet 7 get weight down...    Obesity> wt=198# which is up 12# in the last 670mo!!! We reviewed low carb, low fat, wt reducing diet 7 exercise prescription...    Multinod Goiter> prev eval from [place] DrGerkin; TFTs showed TSH=0.97, and FreeT3/ FreeT4 are wnl...    GI> GERD, Polyps> on Prilosec & had episode of choking on dinner w/ resp exac- see speech path eval; followed by DrGessner for GI w/ 26yr f/u colonoscopy due soon...    GU> Bladder Cancer> Papilllary TCCa resected 2001 & no recurrence; followed by DrPeterson w/ yearly cystos neg...    DJD/ LBP> on musc relaxer & Advil; prev rotator cuff surg 1991, left TKR 2002, now followed by DrRamos for LBP & plans shot in back (she reports it's improved spontaneously)...    Osteopenia> on Actonel, calcium, MVI, Vit D; ?when her last BMD was done?    Anxiety> as noted- she has Alpraz for prn  use but doesn't take it often...  ~  April 30, 2011:  24mo ROV & [place] has been stable w/ her severe lung disease as noted; unfortunately she still self-adjusts her meds despite my admonition to take meds regularly as prescribed for max benefit; she tells me she has "6 trips" coming up & she wants her "magic shot" meaning DepoMedrol; we have on numerous occas discussed the importance of minimizing the cortisone Rx & maximizing her bronchdilators etc; plus we have implored her to decrease her aggressive travel schedule fearing  that she will have a resp exacerbation (w/ acute resp failure) [place]... See prob list below>> LABS 3/13:  FLP- not at goal on Feno120;  Chems- ok x BS=124 A1c=6.7 on Metform monotherapy...  ~  October 31, 2011:  54mo ROV & Arabella states she decided to "get healthy" & hired a Systems analyst but during her 1st session she developed an irreg tachyarrhythmia & was Dx w/ AFib & rvr> seen by [employer] & DrAllred & treated w/ Eliquis5Bid, Metoprolol25-1/2Bid, along w/ her Verelan & DiovanHCT;  2DEcho showed modLVH, norm LVF w/ EF=55-60%, no regional wall motion abn, mildMR, mildLAdil, no ASD/PFO, PAsys=31;  Now improved & feeling better...    She was also being evaluated by DrGessner for nausea x54mo> she states nothing helps- on Zofran, Align, etc;  He did EGD/ Colon 6/13 w/ gastric polyps (hyperplastic), colon polyps (largest 63mm= tub adenoma), mild divertics, int hems;  He did AbdSonar- normal GB, no dilatation, +hepatic steatosis; Then CT Abd showed a decompressed GB, some wall thickening ?inflamm, and other findings like extensive atherosclerotic dis in abd, sm umbil hernia, & severe pelvic organ prolapse; finally HIDA scan normal; she tells me she has appt w/ DrGerkin to consider GB surg...    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 2) History of stage IA (T1, N0, MX) non-small cell lung cancer, adenocarcinoma with bronchoalveolar features diagnosed in November 2005 involving the right upper lobe. Prior Therapy: 1) Status post right upper lobectomy on January 03, 2004. 2) Status post wedge resection of the left upper lobe on February 06, 2009 under the care of Dr. Edwyna Shell. Current Therapy:  Observation    We reviewed prob list, meds, xrays and labs> see below for updates >> she will get the 2013 flu shot at [facility]...   02/28/2012 Acute OV  Complains of increased SOB, wheezing, tightness in chest "off and on since New Year's", worse x2 weeks.  Cant stop coughing . Cough is keeping her up at night. Delsym is not working.  No hemoptysis , edema or fever.  She was called in avelox 3 weeks ago , without much help. Called in prednisone taper but did not take.   She has multiple risk factors for OSA , denies daytime sleepiness. Or snoring.  We discussed again regarding sleep study , she delcines. But does agree to ONO rec Augmentin 875mg  Twice daily  For 7 days  Steroid taper over next week.  Depo Medrol shot today  Mucinex DM Twice daily  As needed  Cough/congestion  Begin  Pepcid 20mg  Twice daily   Hydromet 1 tsp every 4-6 hr As needed  Cough  , may make you sleepy.  We are setting you up for a oxygen check at night.  Follow up Dr. Kriste Basque  In 1 month     04/24/2012 f/u ov/Clarkson Rosselli cc doe > slow adl's.very hoarse barking upper airway cough x 3 days, no mucus at all "but I know it's going to turn green soon".  No better with saba.   No obvious daytime variabilty or assoc chronic cough or cp or chest tightness, subjective wheeze overt sinus or hb symptoms. No unusual exp hx    Sleeping ok without nocturnal  or early am exacerbation  of respiratory  c/o's or need for noct saba. Also denies any obvious fluctuation of symptoms with  weather or environmental changes or other aggravating or alleviating factors except as outlined above     Problem List:      ASTHMA (ICD-493.90) / COPD (ICD-496) - ex-smoker, quit 1997... Back on ADVAIR500 & SPIRIVA 1/d + MUCINEX 2Bid & PROVENTIL Prn... she was participating in [facility] (last 3/09) & stopped on her own... may have a superimposed component of restriction due to obesity & prev lung surgeries... ~  11/05: s/p RULobectomy for lung ca- (adeno w/ bronchoalveolar cell features). ~  PFT 8/08 showed FVC=2.02 (77%), FEV1=1.07 (51%), %1sec=53, mid-flows=19%pred. ~  PFT 7/09 today= FVC=1.93 (72%), FEV1=1.04 (49%), %1sec=54, mid-flows=19%pred. ~  1/11:  s/p LUL resection by DrBurney> multicentric bronchoalveolar cell cancer. ~  PFT 8/12 showed FVC=2.25 (88%), FEV1=1.08 (56%), %1sec=48, mid-flows=30% pred. ~  10/12:  She reports Dulera caused sore mouth so she switched back to Advair500 but asked to do this Bid regularly.  Hx of LUNG CANCER (ICD-162.9) - Hx multicentric bronchoalveolar cell lung cancer >> followed by DrBurney for CVTS & DrMohammed for Oncology... ~  s/p right upper lobectomy by DrBurney 11/05 for a stage 1A non-small cell lung cancer (adenocarcinoma w/ bronchalveolar cell features); post-op observation only... ~  CT Chest 11/08 showed no recurrence, mult bilat nodules unchanged x3+ yrs, nodular thyroid w/o change... ~  CXR 7/09 showed stable post-op changes and scarring on the right, NAD.[name]. ~  CTAngio Chest 10/09 showed neg for PE, prom thyroid, atherosclerotic changes in Ao, no change in ground-glass nodules in LUL area... ~  CT Chest 11/10 by DrMohammed showed new LUL solid nodule, no change in ground-glass areas... lesion was PET pos... ~  1/11:  s/p LULobectomy & node dissection via minithoracotomy by DrBurney- path showed 3 foci of well diff bronchoalveolar cell ca & neg nodes... decision made at conference for no chemoRx, EGFR assay was neg... ~  she continues  to have monitoring by DrBurney/ DrMohammed> on observation alone now; note from DrMohammed 9/12 indicated she was stable & he plans f/u CT Chest & OV in 9mo... ~  Last CT Chest 9/12 showed stable 9mm ground glass opac LUL w/o change from 3/12 scan; +post op changes, COPD, coronary calcif, & hep steatosis...  HYPERTENSION (ICD-401.9) - on DIOVAN/Hct 160-12.5 Daily, VERAPAMIL SR 360mg /d, & LASIX 20mg  "Prn"...  ~  Cardiac eval 9/09 by Walker Kehr was neg and 2DEcho showed DD w/ norm LVsys function, EF= 60-65%, no regional wall motion abn... ~  3/13:  BP= 122/72 & feeling OK, tolerating Rx (DiovHct 160/12.5 & Verelan240)... denies CP, palipit, dizziness, syncope, ch in dyspnea, edema, etc...  ~  9/13:  BP= 114/62 & meds adjusted in the interval w/ DiovHct 160/12.5 & Verelan360 (plus Metop25-1/2Bid now)...  PAROXYSMAL ATRIAL FIBRILLATION (ICD-427.31) - hx PAF in the post op period... converted to NSR & holding... transiently on Amiodarone in past & she wanted off Rx.  ~  Developed AFib w/ rvr 9/13 & eval by DrAllred> treated w/ Eliquis5Bid, Metoprolol25-1/2Bid, along w/ her Verelan & DiovanHCT... ~  2DEcho 9/13 showed modLVH, norm LVF w/ EF=55-60%, no regional wall motion abn, mildMR, mildLAdil, no ASD/PFO, PAsys=31  PERIPHERAL VASCULAR DISEASE (ICD-443.9) - she has atherosclerotic changes in her Ao noted on her prev scans...  ~  11/10: pt had mult questions about this problem on the prob list- discussed "hardening of the arteries" in detail.  VENOUS INSUFFICIENCY (ICD-459.81) - she knows to follow a  low sodium diet, elevate legs, wear support hose, etc; she insists on keeping Lasix 20mg  on hand for Prn use...  HYPERLIPIDEMIA (ICD-272.4) - prev on Livolo 2mg - 1/2 tab daily (stopped due to aching), +FENOGLIDE 120mg /d, FISH OIL & CoQ10  supplements... prev on Vytorin but INTOL ==> she states INTOL to all statins... she was not satis w/ the Lipid Clinic in the past. ~  labs 8/08 off Vytorin showed TChol 183,  TG 207, HDL 46, LDL 112... try fenofibrate... ~  FLP 5/09 on Feno120 showed TChol 188, TG 111, HDL 28, LDL 137... cont same, better diet, get wt down! ~  FLP 4/10 on Feno120 showed TChol 240, TG 104, HDL 49, LDL 165... I rec f/u Lipid Clinic, she declined. ~  FLP 7/10 on Feno120 showed TChol 222, TG 152, HDL 36, LDL 172... rec> try LIVALO 2mg - 1/2 tab Qhs, she stopped. ~  FLP 11/10 on Feno120+FishOil showed TChol 253, TG 125, HDL 42, LDL 208... try LIVALO2mg - 1/2 tab/d & stay on it. ~  FLP 1/11 on Liv1mg +Feno120 showed TChol 170, TG 101, HDL 45, LDL 105... continue same. ~  3/11: she reports aching all over & DrBurney stopped the Livolo> contin diet + other meds. ~  FLP 8/11 showed TChol 225, TG 238, HDL 41, LDL 156... INTOL all statins, she'll do the best she can w/ diet. ~  FLP 8/12 in hosp showed TChol 234, TG 276, HDL 52, LDL 127... Counseled on low chol, low fat, wt reducing diet. ~  FLP 3/13 on Feno120 showed TChol 221, TG 85, HDL 56, LDL 159... She refuses Statin Rx or Lipid Clinic referral...  DIABETES MELLITUS (ICD-250.00) - started on METFORMIN 500mg Bid 4/10, but decr on her own to 1 daily 1/11... we had stressed the importance of diet- low carb/ low fat and weight reduction, along w/ her pulm rehab exercises... ~  labs 4/10 showed BS= 131, HgA1c= 7.0.[name] KitchenMarland [name] Metformin500Bid started. ~  labs 7/10 showed BS= 134, A1c= 6.0 ~  labs 11/10 showed BS= 148, A1c= 6.5 ~  1/11:  she cut the Metformin to 1/d due to nausea. ~  labs 8/11 showed BS= 137, A1c= 5.7.[name] KitchenMarland [name] continue same, get wt down. ~  Labs 8/12 in hosp on Metform500/d showed BS= 110-230, A1c=6.5 ~  Labs 10/12 on Metform500/d showed BS= 141, A1c=7.0.[name] KitchenMarland [name] Needs better diet, get wt down, or more meds. ~  1/13:  Ophthalmology check up by DrMcCuen> neg- no diabetic retinopathy... ~  Labs 3/13 on Metform500/d showed BS= 124, A1c= 6.7  NONTOXIC MULTINODULAR GOITER (ICD-241.1) - eval and rx by DrBalan for Endocrinology & DrGerkin for CCS; she is  asymptomatic; dominant nodule was needled and benign; surg consult from DrGerkin- elected observation & he checks her yearly... ~  seen 6/10 by DrGerkin- f/u sonar w/o change in any of the nodules... ~  labs 7/10 showed TSH= 0.89 ~  seen 6/11 by DrGerkin- f/u sonar w/o change in nodules. ~  Labs 8/12 showed TSH= 0.047... Not on Thyroid meds, needs f/u DrBalan (she never went). ~  Labs 10/12 showed TSH= 0.97, FreeT3= 3.2 (2.3-4.2), FreeT4= 0.88 (0.60-1.60)  OBESITY (ICD-278.00) - obese w/ abd panniculus & we discussed diet + exercise strategies.[name]. ~  weight up to 205# 11/09- we discussed diet, calorie restriction, exercise, & get the weight down... ~  weight 4/10 = 198# ~  weight 7/10 = 194# ~  weight 11/10 = 196# ~  weight 2/11 = 184# (post op) ~  weight 8/11 = 181# ~  weight 11/11 = 186#... she needs to do better! ~  Weight 8/12 = 186# ~  Weight 10/12 = 198#... What happened? ~  Weight 3/13 = 189#... Keep up the good work! ~  Weight 9/13 = 184#  GERD (ICD-530.81) - on PRILOSEC 20mg /d... ~  EDG 6/13 by DrGessner showed mult gastric polyps (hyperplastic), mod gastritis, & treated w/ Omep20mg /d...  COLON POLYPS, DIVERTICULOSIS, HEMORRHOIDS >> ~  colonoscopy 7/09 by DrGessner showed 4 sm polyps= tubular adenoma on bx... f/u planned 3 yrs... ~  Colonoscopy 6/13 by DrGessner showed mult sm polyps, largest 65mm= tub adenoma, plus mild diverticvs, int hems, etc...  NEPHROLITHIASIS, HX OF (ICD-V13.01)  Hx of BLADDER CANCER (ICD-188.9) - had hematuria in 2001 & referred to DrPeterson... 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 all subseq cystoscopies have been neg- no recurrence. ~  she reports f/u w/ DrPeterson yearly & doing satis by her report.  DEGENERATIVE JOINT DISEASE (ICD-715.90) - s/p rotator cuff repair in 1991... s/p left TKR from DrGioffre in 2002...  OSTEOPENIA (ICD-733.90) - off prev ACTONEL 150/mo for a drug  holiday, ca++, MVI, etc... ~  I cannot find baseline BMD in EPIC or cone system> ?done by GYN? ~  labs 8/11 showed Vit D level = 22... rec> start Vit D supplement extra 02-1998 u daily... ~  2013:  Actonel removed from her list by DrGessner for drug holiday...  ANXIETY (ICD-300.00) - on XANAX 0.25mg  Prn... she has signif hx of claustrophobia, but denies that she has any anxiety...   Past Surgical History  Procedure Date  . S/p right rotator cuff repair 1991    Dr. Meade Maw, right  . S/p turbt 10/01    Dr. Vonita Moss  . S/p left tkr 2002    Dr. Darrelyn Hillock  . 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 S/p d&c for removal of endometrial polyp (benign) 12/10    GYN  . Colonoscopy   . Esophagogastroduodenoscopy     Outpatient Encounter Prescriptions as of 02/28/2012  Medication Sig Dispense Refill  . ADVAIR DISKUS 500-50 MCG/DOSE AEPB inhale 1 dose by mouth twice a day  60 each  3  . ALPRAZolam (XANAX) 0.25 MG tablet Take 1/2 to 1 tablet BID PRN  60 tablet  5  . apixaban (ELIQUIS) 5 MG TABS tablet Take 5 mg by mouth daily.      . Biotin 5000 MCG TABS Take 1 tablet by mouth daily.      . cetirizine (ZYRTEC) 10 MG tablet Take 10 mg by mouth as needed.       . Coenzyme Q10 (COQ10) 200 MG CAPS Take 1 capsule by mouth daily.      . Cyanocobalamin (VITAMIN B 12 PO) Take by mouth. 1000 mcg once a day       . furosemide (LASIX) 20 MG tablet take 1 tablet by mouth once daily if needed for SWELLING  30 tablet  PRN  . metFORMIN (GLUCOPHAGE) 500 MG tablet take 1 tablet by mouth every morning  30 tablet  6  . methocarbamol (ROBAXIN) 500 MG tablet take 1 tablet by mouth three times a day if needed  90 tablet  3  . metoprolol tartrate (LOPRESSOR) 25 MG tablet Take 12.5 mg by mouth daily.       [name]  Multiple Vitamin (MULTIVITAMIN PO) Take by mouth. Once  a day       . Omega-3 Fatty Acids (FISH OIL) 1200 MG CAPS Take by mouth. Once a day      . ondansetron (ZOFRAN-ODT) 4 MG disintegrating tablet Take 4 mg by mouth every 8 (eight) hours as needed.      [name] PROAIR HFA 108 (90 BASE) MCG/ACT inhaler inhale 1 to 2 puffs by mouth every 4 hours if needed for wheezing  1 Inhaler  11  . Probiotic Product (ALIGN PO) Take 1 tablet by mouth daily.      . promethazine (PHENERGAN) 12.5 MG tablet Take 2 tablets (25 mg total) by mouth every 6 (six) hours as needed for nausea.  30 tablet  0  . SPIRIVA HANDIHALER 18 MCG inhalation capsule INHALE THE CONTENTS OF 1 CAPSULE VIA HANDIHALER ONCE DAILY.  30 each  11  . valsartan-hydrochlorothiazide (DIOVAN-HCT) 160-12.5 MG per tablet TAKE 1 TABLET BY MOUTH ONCE DAILY  30 tablet  11  . verapamil (VERELAN PM) 360 MG 24 hr capsule Take 1 capsule (360 mg total) by mouth at bedtime.  30 capsule  6  . Vitamin D, Ergocalciferol, (DRISDOL) 50000 UNITS CAPS take 1 capsule by mouth every week  4 capsule  12   Facility-Administered Encounter Medications as of 02/28/2012  Medication Dose Route Frequency Provider Last Rate Last Dose  . 0.9 %  sodium chloride infusion  500 mL Intravenous Continuous Iva Boop, MD        Allergies                                          Pt states INTOL to ALL STATINS...  Allergen Reactions  . Cephalexin     REACTION: nausea and diarrhea  . Codeine     REACTION: nausea  . Epinephrine     REACTION: nervous  . Erythromycin     REACTION: all mycins cause yeast infections  . Morphine     REACTION: nausea  . Sulfamethoxazole W/Trimethoprim     REACTION: nausea and diarrhea    Current Medications, Allergies, Past Medical History, Past Surgical History, Family History, and Social History were reviewed in [employer] record.    Review of Systems         Constitutional:   No  weight loss, night sweats,  Fevers, chills +, fatigue, or  lassitude.  HEENT:   No headaches,   Difficulty swallowing,  Tooth/dental problems, or  Sore throat,                No sneezing, itching, ear ache, + nasal congestion, post nasal drip,   CV:  No chest pain,  Orthopnea, PND, swelling in lower extremities, anasarca, dizziness, palpitations, syncope.   GI  No heartburn, indigestion, abdominal pain, nausea, vomiting, diarrhea, change in bowel habits, loss of appetite, bloody stools.   Resp:    No coughing up of blood.   No chest wall deformity  Skin: no rash or lesions.  GU: no dysuria, change in color of urine, no urgency or frequency.  No flank pain, no hematuria   MS:  No joint pain or swelling.  No decreased range of motion.  No back pain.  Psych:  No change in mood or affect. No depression or anxiety.  No memory loss.       Objective:  Physical Exam     WD, WN, 78 y/o  WF  harsh barking quality cough GENERAL:  Alert & oriented; very anxious... HEENT:  [place]/AT, EOM-wnl, PERRLA, EACs-clear, TMs-wnl, NOSE-clear drainage THROAT-clear & wnl., class 2 airway  NECK:  Supple w/ fairROM; no JVD; normal carotid impulses w/o bruits; no thyromegaly or nodules palpated; no lymphadenopathy. CHEST; s/p VATS surg scar on right & mini thoracotomy scar on left.  HEART:  Regular Rhythm; without murmurs/ rubs/ or gallops heard... ABDOMEN:  Obese w/ panniculus, soft & nontender; normal bowel sounds; no organomegaly or masses detected. EXT:    mild arthritic changes; no varicose veins/ +venous insuffic/ tr edema.    Assessment & Plan:

## 2012-04-27 ENCOUNTER — Other Ambulatory Visit: Payer: Self-pay | Admitting: Medical Oncology

## 2012-04-27 ENCOUNTER — Telehealth: Payer: Self-pay | Admitting: Pulmonary Disease

## 2012-04-27 DIAGNOSIS — E785 Hyperlipidemia, unspecified: Secondary | ICD-10-CM

## 2012-04-27 DIAGNOSIS — C3490 Malignant neoplasm of unspecified part of unspecified bronchus or lung: Secondary | ICD-10-CM

## 2012-04-27 DIAGNOSIS — E119 Type 2 diabetes mellitus without complications: Secondary | ICD-10-CM

## 2012-04-27 DIAGNOSIS — F411 Generalized anxiety disorder: Secondary | ICD-10-CM

## 2012-04-27 NOTE — Telephone Encounter (Signed)
Per pt's chart, lipid and hepatic are done every 6 months Called spoke with patient who verified that she is needing lab orders placed for upcoming ov w/ SN on 3.27.14 Would like to come for labs tomorrow or Wednesday Last cbcd, and bmet were don with 6 months Tsh done within the past year A1C was last checked 3.20.13 Lipid, hepatic and A1C ordered Dr Kriste Basque, would you like any other labs ordered?  Thanks.

## 2012-04-27 NOTE — Telephone Encounter (Signed)
Orders for the labs are in the computer for the pt  And the pt is aware.

## 2012-04-28 ENCOUNTER — Telehealth: Payer: Self-pay | Admitting: Internal Medicine

## 2012-04-28 NOTE — Telephone Encounter (Signed)
Added lb for 3/31 @ 9:30 am prior to 10:30 am ct @ [facility] for pt on both home/cell. Also confirmed 4/2 f/u appt.

## 2012-04-29 ENCOUNTER — Other Ambulatory Visit (INDEPENDENT_AMBULATORY_CARE_PROVIDER_SITE_OTHER): Payer: Medicare Other

## 2012-04-29 DIAGNOSIS — E785 Hyperlipidemia, unspecified: Secondary | ICD-10-CM

## 2012-04-29 DIAGNOSIS — E119 Type 2 diabetes mellitus without complications: Secondary | ICD-10-CM

## 2012-04-29 DIAGNOSIS — F411 Generalized anxiety disorder: Secondary | ICD-10-CM

## 2012-04-29 LAB — HEPATIC FUNCTION PANEL
ALT: 28 U/L (ref 0–35)
AST: 19 U/L (ref 0–37)
Albumin: 3.7 g/dL (ref 3.5–5.2)
Total Protein: 7.1 g/dL (ref 6.0–8.3)

## 2012-04-29 LAB — HEMOGLOBIN A1C: Hgb A1c MFr Bld: 6.9 % — ABNORMAL HIGH (ref 4.6–6.5)

## 2012-04-29 LAB — LIPID PANEL
HDL: 46.1 mg/dL (ref >39.00)
Triglycerides: 84 mg/dL (ref 0.0–149.0)

## 2012-04-30 ENCOUNTER — Ambulatory Visit (INDEPENDENT_AMBULATORY_CARE_PROVIDER_SITE_OTHER): Payer: Medicare Other | Admitting: Pulmonary Disease

## 2012-04-30 ENCOUNTER — Telehealth: Payer: Self-pay | Admitting: Pulmonary Disease

## 2012-04-30 ENCOUNTER — Encounter: Payer: Self-pay | Admitting: Pulmonary Disease

## 2012-04-30 VITALS — BP 140/60 | HR 59 | Temp 97.6°F | Ht 62.0 in | Wt 202.6 lb

## 2012-04-30 DIAGNOSIS — E042 Nontoxic multinodular goiter: Secondary | ICD-10-CM

## 2012-04-30 DIAGNOSIS — C3490 Malignant neoplasm of unspecified part of unspecified bronchus or lung: Secondary | ICD-10-CM

## 2012-04-30 DIAGNOSIS — F411 Generalized anxiety disorder: Secondary | ICD-10-CM

## 2012-04-30 DIAGNOSIS — M899 Disorder of bone, unspecified: Secondary | ICD-10-CM

## 2012-04-30 DIAGNOSIS — E119 Type 2 diabetes mellitus without complications: Secondary | ICD-10-CM

## 2012-04-30 DIAGNOSIS — K219 Gastro-esophageal reflux disease without esophagitis: Secondary | ICD-10-CM

## 2012-04-30 DIAGNOSIS — M1990 Unspecified osteoarthritis, unspecified site: Secondary | ICD-10-CM

## 2012-04-30 DIAGNOSIS — Z860101 Personal history of adenomatous and serrated colon polyps: Secondary | ICD-10-CM

## 2012-04-30 DIAGNOSIS — I739 Peripheral vascular disease, unspecified: Secondary | ICD-10-CM

## 2012-04-30 DIAGNOSIS — I872 Venous insufficiency (chronic) (peripheral): Secondary | ICD-10-CM

## 2012-04-30 DIAGNOSIS — E785 Hyperlipidemia, unspecified: Secondary | ICD-10-CM

## 2012-04-30 DIAGNOSIS — I10 Essential (primary) hypertension: Secondary | ICD-10-CM

## 2012-04-30 DIAGNOSIS — E669 Obesity, unspecified: Secondary | ICD-10-CM

## 2012-04-30 DIAGNOSIS — Z860100 Personal history of colon polyps, unspecified: Secondary | ICD-10-CM

## 2012-04-30 DIAGNOSIS — J45909 Unspecified asthma, uncomplicated: Secondary | ICD-10-CM

## 2012-04-30 DIAGNOSIS — C679 Malignant neoplasm of bladder, unspecified: Secondary | ICD-10-CM

## 2012-04-30 DIAGNOSIS — I4891 Unspecified atrial fibrillation: Secondary | ICD-10-CM

## 2012-04-30 NOTE — Telephone Encounter (Signed)
Will forward to Leigh to keep an eye out for labs and can send to Dr Shirline Frees.

## 2012-04-30 NOTE — Progress Notes (Signed)
Subjective:    Patient ID: [name], female    DOB: [birth year], 78 y.o.   MRN: [id number]  HPI 78 y/o WF here for a follow up visit... she has mult medical specialists who follow all of her medical problems (see below)...  ~  December 05, 2010:  10mo ROV & she notes incr SOB w/ activ, & states she never got back to her prev baseline after last hosp & upset because she missed 2 trips; she notes that she hurt her back & hasn't been exercising because of this- since then incr SOB w/ some ADLs and incr edema in her legs;  She notes that she "can't get a deep breath" & she appears more anxious than usual w/ mult somatic complaints- indigestion, cramps in hands & feet, etc...  We discussed taking the Alprazolam 0.25mg  Tid regularly for her chest wall musc spasm so she can get a good deep breath & see how this works (she is asking about Accolate because a friend is on it & it really helped them)...    COPD w/ revers component> on Advair500 but only using it once/d (she stopped [facility] stating it made her mouth sore), Spiriva, Mucinex, & Proventil HFA; asked (again) to maximize her regimen w/ AdvairBid, Mucinex2Bid, Fluids, etc; last PFT w/ FEV1=1.08 stable; see above...    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 NOTE: element of lung restriction from surg & obesity...    HBP> on DiovanHCT, Verapamil, & Lasix prn; BP= 144/80 & similar at home; she has mult somatic complaints...    Ven Insuffic> she knows to elim sodium but she eats out often, elev legs, wear support hose; she has been taking the Lasix20mg  daily due to the swelling & improved...     Hyperlipid> on Fenoglide120 & FishOil; she states INTOL to all statins; last FLP 8/12 in hosp as below; asked to ret for fasting blood work...    DM> on  Metform500/d; BS=141, A1c=7.0; she will need more meds if she can't get on diet 7 get weight down...    Obesity> wt=198# which is up 12# in the last 10mo!!! We reviewed low carb, low fat, wt reducing diet 7 exercise prescription...    Multinod Goiter> prev eval from [facility] DrGerkin; TFTs showed TSH=0.97, and FreeT3/ FreeT4 are wnl...    GI> GERD, Polyps> on Prilosec & had episode of choking on dinner w/ resp exac- see speech path eval; followed by DrGessner for GI w/ 67yr f/u colonoscopy due soon...    GU> Bladder Cancer> Papilllary TCCa resected 2001 & no recurrence; followed by DrPeterson w/ yearly cystos neg...    DJD/ LBP> on musc relaxer & Advil; prev rotator cuff surg 1991, left TKR 2002, now followed by DrRamos for LBP & plans shot in back (she reports it's improved spontaneously)...    Osteopenia> on Actonel, calcium, MVI, Vit D; ?when her last BMD was done?    Anxiety> as noted- she has Alpraz for prn use but doesn't take it often...  ~  April 30, 2011:  55mo ROV & [name] has been stable w/ her severe lung disease as noted; unfortunately she still self-adjusts her meds despite my admonition to take meds regularly as prescribed for max benefit; she tells me she has "6 trips" coming up & she wants her "magic  shot" meaning DepoMedrol; we have on numerous occas discussed the importance of minimizing the cortisone Rx & maximizing her bronchdilators etc; plus we have implored her to decrease her aggressive travel schedule fearing that she will have a resp exacerbation (w/ acute resp failure) [place]... See prob list below>> LABS 3/13:  FLP- not at goal on Feno120;  Chems- ok x BS=124 A1c=6.7 on Metform monotherapy...  ~  October 31, 2011:  32mo ROV & Shaquitta states she decided to "get healthy" & hired a Systems analyst but during her 1st session she developed an irreg tachyarrhythmia & was Dx w/ AFib & rvr> seen by [employer] & DrAllred & treated w/ Eliquis5Bid, Metoprolol25-1/2Bid,  along w/ her Verelan & DiovanHCT;  2DEcho showed modLVH, norm LVF w/ EF=55-60%, no regional wall motion abn, mildMR, mildLAdil, no ASD/PFO, PAsys=31;  Now improved & feeling better...    She was also being evaluated by DrGessner for nausea x62mo> she states nothing helps- on Zofran, Align, etc;  He did EGD/ Colon 6/13 w/ gastric polyps (hyperplastic), colon polyps (largest 74mm= tub adenoma), mild divertics, int hems;  He did AbdSonar- normal GB, no dilatation, +hepatic steatosis; Then CT Abd showed a decompressed GB, some wall thickening ?inflamm, and other findings like extensive atherosclerotic dis in abd, sm umbil hernia, & severe pelvic organ prolapse; finally HIDA scan normal; she tells me she has appt w/ DrGerkin to consider GB surg...    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 2) History of stage IA (T1, N0, MX) non-small cell lung cancer, adenocarcinoma with bronchoalveolar features diagnosed in November 2005 involving the right upper lobe. Prior Therapy: 1) Status post right upper lobectomy on January 03, 2004. 2) Status post wedge resection of the left upper lobe on February 06, 2009 under the care of Dr. Edwyna Shell. Current Therapy:  Observation    We reviewed prob list, meds, xrays and labs> see below for updates >> she will get the 2013 flu shot at [facility]...  ~  April 30, 2012:  32mo ROV & [name] has had a lot going on> saw TP 1/14 w/ resp exac c/o incr SOB, cough, wheezing, tightness off & on for 45mo; took Avelox=> Augmentin, Depo, Pred, Mucinex, Pepcid, Hydromet, etc;  Then she had OV 3/14 w/ DrWert w/ barking cough, hoarseness, etc> he stopped her Advair & changed to Dulera100, gave her Depo120 & stressed Pepcid & antireflux regimen...  She ret for 1wk f/u after changes by  DrWert but she is no better, upset that the depo didn't help this time but true-to-form she has adjusted her own meds & we discussed regimen & need for regular dosing of all meds- Dulera100-2spBid, spiriva daily, Mucinex-2Bid, fluids, along w/ her antireflux regimen, elev HOB, Pepcid Bid, etc...     COPD w/ revers component> on Dulera100 now but only using it once/d, Spiriva, Mucinex, & Proventil HFA; asked (again) to maximize her regimen w/ Dulera100-2spBid, Mucinex2Bid, Fluids, etc; last PFT w/ FEV1=1.08 stable; see below...    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 NOTE: element of lung restriction from surg &  obesity...    HBP> on Metoprolol25-1/2Bid, DiovanHCT160-12.5, Verapamil360, & Lasix20 prn; BP= 140/60 & similar at home; she has mult somatic complaints...    PAFib> She had recurrent PAF 9/13 while exercising; seen by [facility] & treated w/ Eliquis5Bid, Metoprolol added, along w/ her Verelan & DiovanHCT; maintaining NSR & denies CP, palpit, dizzy, etc; tolerating Rx well- no changes made; he did Myoview 10/13 which was neg- no ischemia, normal wall motion, EF=74%...     Ven Insuffic> she knows to elim sodium but she eats out often, elev legs, wear support hose; she has been taking the Lasix20mg  daily due to the swelling & improved...     Hyperlipid> off prev Fenoglide & on FishOil, CoQ10, diet; she states INTOL to all statins; FLP 3/14 shows TChol 196, TG 84, HDL 46, LDL 133    DM> on [id number]; labs 3/14 shows BS= not done, A1c=6.9 (stable); she is advised to continue same, get on diet & get weight down...    Obesity> wt=203# which is up 5# further; We reviewed low carb, low fat, wt reducing diet & exercise prescription...    Multinod Goiter> prev eval from [facility] DrGerkin;  TFTs showed TSH=0.98, and prev FreeT3/ FreeT4 were wnl...    GI> GERD, Polyps> on Prilosec/Pepcid but won't take them regularly & has hx episode of choking on dinner w/ resp exac- see prev speech path eval; followed by DrGessner for GI w/ EGD/Colon 6/13 showing mult hyperplastic polyps in stomach, & Colon w/ divertics, AVM, int hems, & numerous sm polyps (max75mm)- tub adenomas w/ repeat planned 42yr...    GU> Bladder Cancer> Papilllary TCCa resected 2001 & no recurrence; followed by DrPeterson w/ yearly cystos neg...    DJD/ LBP> on musc relaxer & Advil; prev rotator cuff surg 1991, left TKR 2002, now followed by DrRamos for LBP & plans shot in back (she reports it's improved spontaneously)...    Osteopenia> prev on Actonel (she stopped on her own), calcium, MVI, Vit D; ?when her last BMD was done?    Anxiety> as noted- she has Alpraz0.25mg  for prn use but doesn't take it often despite being asked to take it Tid regularly...  We reviewed prob list, meds, xrays and labs> see below for updates >> prolonged OV requiring >105min review of data & discussion w/ pt... LABS 3/14:  FLP- improved but LDL=133 on diet alone, refuses meds;  Chems- not done as requested just LFTs=wnl;  TSH=0.98;  A1c=6.9          Problem List:      ASTHMA (ICD-493.90) / COPD (ICD-496) - ex-smoker, quit 1997... Back on ADVAIR500 & SPIRIVA 1/d + MUCINEX 2Bid & PROVENTIL Prn... she was participating in [facility] (last 3/09) & stopped on her own... may have a superimposed component of restriction due to obesity & prev lung surgeries... ~  11/05: s/p RULobectomy for lung ca- (adeno w/ bronchoalveolar cell features). ~  PFT 8/08 showed FVC=2.02 (77%), FEV1=1.07 (51%), %1sec=53, mid-flows=19%pred. ~  PFT 7/09 today= FVC=1.93 (72%), FEV1=1.04 (49%), %1sec=54, mid-flows=19%pred. ~  1/11:  s/p LUL resection by DrBurney> multicentric bronchoalveolar cell cancer. ~  PFT 8/12 showed FVC=2.25 (88%), FEV1=1.08 (56%), %1sec=48,  mid-flows=30% pred. ~  10/12:  She reports Dulera caused sore mouth so she switched back to Advair500 but asked to do this Bid regularly. ~  2013:  She won't stick w/ any therapy regularly & continues to decr her dosing on her own despite freq exacerbations... ~  3/14:  Now on  Dulera100 per DrWert (despite prev hx of sore mouth) but only using it once/d, Spiriva, Mucinex, & Proventil HFA; asked (again) to maximize her regimen w/ Dulera100-2spBid, Mucinex2Bid, Fluids, etc; last PFT w/ FEV1=1.08 stable...  Hx of LUNG CANCER (ICD-162.9) - Hx multicentric bronchoalveolar cell lung cancer >> followed by DrBurney for CVTS & DrMohammed for Oncology... ~  s/p right upper lobectomy by DrBurney 11/05 for a stage 1A non-small cell lung cancer (adenocarcinoma w/ bronchalveolar cell features); post-op observation only... ~  CT Chest 11/08 showed no recurrence, mult bilat nodules unchanged x3+ yrs, nodular thyroid w/o change... ~  CXR 7/09 showed stable post-op changes and scarring on the right, NAD.[name]. ~  CTAngio Chest 10/09 showed neg for PE, prom thyroid, atherosclerotic changes in Ao, no change in ground-glass nodules in LUL area... ~  CT Chest 11/10 by DrMohammed showed new LUL solid nodule, no change in ground-glass areas... lesion was PET pos... ~  1/11:  s/p LULobectomy & node dissection via minithoracotomy by DrBurney- path showed 3 foci of well diff bronchoalveolar cell ca & neg nodes... decision made at conference for no chemoRx, EGFR assay was neg... ~  she continues to have monitoring by DrBurney/ DrMohammed> on observation alone now; note from DrMohammed 9/12 indicated she was stable & he plans f/u CT Chest & OV in 51mo... ~  CT Chest 9/12 per DrMohammed showed stable 9mm ground glass opac LUL w/o change from 3/12 scan; +post op changes, COPD, coronary calcif, & hep steatosis... ~  CT Chest 9/13 per DrMohammed showed 4mm ground glass nodule ant LUL, postop bilat scarring, normal heart size w/ calcif Ao &  coronaries, no adenopathy, no evid recurrent dis...  HYPERTENSION (ICD-401.9) - on METOPROLOL25-1/2Bid, DIOVAN/Hct 160-12.5 Daily, VERAPAMIL SR 360mg /d, & LASIX 20mg  "Prn"...  ~  Cardiac eval 9/09 by Walker Kehr was neg and 2DEcho showed DD w/ norm LVsys function, EF= 60-65%, no regional wall motion abn... ~  3/13:  BP= 122/72 & feeling OK, tolerating Rx (DiovHct 160/12.5 & Verelan240)... denies CP, palipit, dizziness, syncope, ch in dyspnea, edema, etc...  ~  9/13:  BP= 114/62 & meds adjusted in the interval w/ DiovHct 160/12.5 & Verelan360 (plus Metop25-1/2Bid now)... ~  3/14:  BP= 140/60 & similar at home; she has mult somatic complaints as noted...  PAROXYSMAL ATRIAL FIBRILLATION (ICD-427.31) - hx PAF in the post op period... converted to NSR & holding... transiently on Amiodarone in past & she wanted off Rx.  ~  Developed AFib w/ rvr 9/13 & eval by DrAllred> treated w/ Eliquis5Bid, Metoprolol25-1/2Bid, along w/ her Verelan & DiovanHCT... ~  2DEcho 9/13 showed modLVH, norm LVF w/ EF=55-60%, no regional wall motion abn, mildMR, mildLAdil, no ASD/PFO, PAsys=31 ~  Myoview 10/13 was NEG- no ischemia, normal wall motion, EF=74% ~  3/14:  She had decr the Metop & Eliquis to once daily on her own & was asked by Cards to take both Bid as directed...  PERIPHERAL VASCULAR DISEASE (ICD-443.9) - she has atherosclerotic changes in her Ao (& coronaries) noted on her prev scans...  ~  11/10: pt had mult questions about this problem on the prob list- discussed "hardening of the arteries" in detail.  VENOUS INSUFFICIENCY (ICD-459.81) - she knows to follow a low sodium diet, elevate legs, wear support hose, etc; she insists on keeping Lasix 20mg  on hand for Prn use...  HYPERLIPIDEMIA (ICD-272.4) - prev on Livolo 2mg - 1/2 tab daily (stopped due to aching), +FENOGLIDE 120mg /d, FISH OIL & CoQ10  supplements... prev on Vytorin  but INTOL ==> she states INTOL to all statins... she was not satis w/ the Lipid Clinic in  the past. ~  labs 8/08 off Vytorin showed TChol 183, TG 207, HDL 46, LDL 112... try fenofibrate... ~  FLP 5/09 on Feno120 showed TChol 188, TG 111, HDL 28, LDL 137... cont same, better diet, get wt down! ~  FLP 4/10 on Feno120 showed TChol 240, TG 104, HDL 49, LDL 165... I rec f/u Lipid Clinic, she declined. ~  FLP 7/10 on Feno120 showed TChol 222, TG 152, HDL 36, LDL 172... rec> try LIVALO 2mg - 1/2 tab Qhs, she stopped. ~  FLP 11/10 on Feno120+FishOil showed TChol 253, TG 125, HDL 42, LDL 208... try LIVALO2mg - 1/2 tab/d & stay on it. ~  FLP 1/11 on Liv1mg +Feno120 showed TChol 170, TG 101, HDL 45, LDL 105... continue same. ~  3/11: she reports aching all over & DrBurney stopped the Livolo> contin diet + other meds. ~  FLP 8/11 showed TChol 225, TG 238, HDL 41, LDL 156... INTOL all statins, she'll do the best she can w/ diet. ~  FLP 8/12 in hosp showed TChol 234, TG 276, HDL 52, LDL 127... Counseled on low chol, low fat, wt reducing diet. ~  FLP 3/13 on Feno120 showed TChol 221, TG 85, HDL 56, LDL 159... She refuses Statin Rx or Lipid Clinic referral... ~  FLP 9/13 on diet alone showed TChol 183, TG 126, HDL 37, LDL 121 ~  FLP 3/14 on diet alone showed TChol 196, TG 84, HDL 46, LDL 133  DIABETES MELLITUS (ICD-250.00) - started on METFORMIN 500mg Bid 4/10, but decr on her own to 1 daily 1/11... we had stressed the importance of diet- low carb/ low fat and weight reduction, along w/ her pulm rehab exercises... ~  labs 4/10 showed BS= 131, HgA1c= 7.0.Marland KitchenMarland Kitchen Metformin500Bid started. ~  labs 7/10 showed BS= 134, A1c= 6.0 ~  labs 11/10 showed BS= 148, A1c= 6.5 ~  1/11:  she cut the Metformin to 1/d due to nausea. ~  labs 8/11 showed BS= 137, A1c= 5.7.Marland KitchenMarland Kitchen continue same, get wt down. ~  Labs 8/12 in hosp on Metform500/d showed BS= 110-230, A1c=6.5 ~  Labs 10/12 on Metform500/d showed BS= 141, A1c=7.0.Marland KitchenMarland Kitchen Needs better diet, get wt down, or more meds. ~  1/13:  Ophthalmology check up by DrMcCuen> neg- no  diabetic retinopathy... ~  Labs 3/13 on Metform500/d showed BS= 124, A1c= 6.7 ~  Labs 3/14 on Metform500/d showed BS= not done, A1c= 6.9  NONTOXIC MULTINODULAR GOITER (ICD-241.1) - eval and rx by DrBalan for Endocrinology & DrGerkin for CCS; she is asymptomatic; dominant nodule was needled and benign; surg consult from DrGerkin- elected observation & he checks her yearly... ~  seen 6/10 by DrGerkin- f/u sonar w/o change in any of the nodules... ~  labs 7/10 showed TSH= 0.89 ~  seen 6/11 by DrGerkin- f/u sonar w/o change in nodules. ~  Labs 8/12 showed TSH= 0.047... Not on Thyroid meds, needs f/u DrBalan (she never went). ~  Labs 10/12 showed TSH= 0.97, FreeT3= 3.2 (2.3-4.2), FreeT4= 0.88 (0.60-1.60) ~  Labs 7/13 showed TSH= 0.68 ~  Labs 3/14 showed TSH= 0.98  OBESITY (ICD-278.00) - obese w/ abd panniculus & we discussed diet + exercise strategies.[name]. ~  weight up to 205# 11/09- we discussed diet, calorie restriction, exercise, & get the weight down... ~  weight 4/10 = 198# ~  weight 7/10 = 194# ~  weight 11/10 = 196# ~  weight 2/11 =  184# (post op) ~  weight 8/11 = 181# ~  weight 11/11 = 186#... she needs to do better! ~  Weight 8/12 = 186# ~  Weight 10/12 = 198#... What happened? ~  Weight 3/13 = 189#... Keep up the good work! ~  Weight 9/13 = 184# ~  Weight 3/14 = 203#  GERD (ICD-530.81) - on PRILOSEC 20mg /d... ~  EDG 6/13 by DrGessner showed mult gastric polyps (hyperplastic), mod gastritis, & treated w/ Omep20mg /d (she subseq stopped on her own)...  COLON POLYPS, DIVERTICULOSIS, HEMORRHOIDS >> ~  colonoscopy 7/09 by DrGessner showed 4 sm polyps= tubular adenoma on bx... f/u planned 3 yrs... ~  Colonoscopy 6/13 by DrGessner showed mult sm polyps, largest 81mm= tub adenoma, plus mild diverticvs, int hems, etc... ~  CT Abd 9/13 revealed GB to be decompressed, ?wall thickening & ?chr inflamm, extensive atherosclerosis in abd vasculature- consider CTA, sm umbil hernia, severe pelvic  organ prolapse;  => HIDA scan 9/13 was neg, wnl...  She has recurrent nausea for which she has seen DrGesnner & DrGerkin...  NEPHROLITHIASIS, HX OF (ICD-V13.01)  Hx of BLADDER CANCER (ICD-188.9) - had hematuria in 2001 & referred to DrPeterson... 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 all subseq cystoscopies have been neg- no recurrence. ~  she reports f/u w/ DrPeterson yearly & doing satis by her report.  DEGENERATIVE JOINT DISEASE (ICD-715.90) - s/p rotator cuff repair in 1991... s/p left TKR from DrGioffre in 2002...  OSTEOPENIA (ICD-733.90) - off prev ACTONEL 150/mo for a drug holiday, ca++, MVI, etc... ~  I cannot find baseline BMD in EPIC or cone system> ?done by GYN? ~  labs 8/11 showed Vit D level = 22... rec> start Vit D supplement extra 02-1998 u daily... ~  2013:  Actonel removed from her list by DrGessner for drug holiday...  ANXIETY (ICD-300.00) - on XANAX 0.25mg  Prn... she has signif hx of claustrophobia, but denies that she has any anxiety...   Past Surgical History  Procedure Laterality Date  . S/p right rotator cuff repair  1991    Dr. Meade Maw, right  . S/p turbt  10/01    Dr. Vonita Moss  . S/p left tkr  2002    Dr. Darrelyn Hillock  . 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 S/p d&c for removal of endometrial polyp (benign)  12/10    GYN  . Colonoscopy    . Esophagogastroduodenoscopy      Outpatient Encounter Prescriptions as of 04/30/2012  Medication Sig Dispense Refill  . ALPRAZolam (XANAX) 0.25 MG tablet Take 1/2 to 1 tablet BID PRN  60 tablet  5  . amoxicillin-clavulanate (AUGMENTIN) 875-125 MG per tablet Take 1 tablet by mouth 2 (two) times daily.  20 tablet  0  . apixaban (ELIQUIS) 5 MG TABS tablet Take 5 mg by mouth 2 (two) times daily.        . Biotin 5000 MCG TABS Take 1 tablet by mouth daily.      . cetirizine (ZYRTEC) 10 MG tablet Take 10 mg by mouth as needed.       . Coenzyme Q10 (COQ10) 200 MG CAPS Take 1 capsule by mouth daily.      . Cyanocobalamin (VITAMIN B 12 PO) Take by mouth. 1000 mcg once  a day       . furosemide (LASIX) 20 MG tablet take 1 tablet by mouth once daily if needed for SWELLING  30 tablet  PRN  . metFORMIN (GLUCOPHAGE) 500 MG tablet take 1 tablet by mouth every morning  30 tablet  6  . methocarbamol (ROBAXIN) 500 MG tablet take 1 tablet by mouth three times a day if needed  90 tablet  3  . metoprolol tartrate (LOPRESSOR) 25 MG tablet Take 12.5 mg by mouth daily.       . mometasone-formoterol (DULERA) 100-5 MCG/ACT AERO Take 2 puffs first thing in am and then another 2 puffs about 12 hours later.  1 Inhaler  0  . Multiple Vitamin (MULTIVITAMIN PO) Take by mouth. Once a day       . ondansetron (ZOFRAN-ODT) 4 MG disintegrating tablet Take 4 mg by mouth every 8 (eight) hours as needed.      . Probiotic Product (ALIGN PO) Take 1 tablet by mouth daily.      . promethazine (PHENERGAN) 12.5 MG tablet Take 2 tablets (25 mg total) by mouth every 6 (six) hours as needed for nausea.  30 tablet  0  . SPIRIVA HANDIHALER 18 MCG inhalation capsule INHALE THE CONTENTS OF 1 CAPSULE VIA HANDIHALER ONCE DAILY.  30 each  11  . valsartan-hydrochlorothiazide (DIOVAN-HCT) 160-12.5 MG per tablet TAKE 1 TABLET BY MOUTH ONCE DAILY  30 tablet  11  . verapamil (VERELAN PM) 360 MG 24 hr capsule Take 1 capsule (360 mg total) by mouth at bedtime.  30 capsule  6  . Vitamin D, Ergocalciferol, (DRISDOL) 50000 UNITS CAPS take 1 capsule by mouth every week  4 capsule  12  . PROAIR HFA 108 (90 BASE) MCG/ACT inhaler inhale 1 to 2 puffs by mouth every 4 hours if needed for wheezing  1 Inhaler  11   Facility-Administered Encounter Medications as of 04/30/2012  Medication Dose Route Frequency Provider Last Rate Last Dose  . 0.9 %  sodium chloride  infusion  500 mL Intravenous Continuous Iva Boop, MD        Allergies                                          Pt states INTOL to ALL STATINS...  Allergen Reactions  . Cephalexin     REACTION: nausea and diarrhea  . Codeine     REACTION: nausea  . Epinephrine     REACTION: nervous  . Erythromycin     REACTION: all mycins cause yeast infections  . Morphine     REACTION: nausea  . Sulfamethoxazole W/Trimethoprim     REACTION: nausea and diarrhea    Current Medications, Allergies, Past Medical History, Past Surgical History, Family History, and Social History were reviewed in [employer] record.    Review of Systems         See HPI - all other systems neg except as noted...  The patient complains of hoarseness, dyspnea on exertion, headaches, muscle weakness, and difficulty walking.  The patient denies anorexia, fever, weight loss, weight gain, vision loss, decreased hearing, chest pain, syncope, peripheral edema, prolonged cough, hemoptysis, abdominal pain, melena, hematochezia, severe indigestion/heartburn, hematuria, incontinence, suspicious skin lesions, transient blindness, depression, unusual weight change, abnormal bleeding, enlarged lymph nodes, and angioedema.     Objective:   Physical Exam  WD, WN, 78 y/o  WF in NAD... GENERAL:  Alert & oriented; pleasant & cooperative... HEENT:  [place]/AT, EOM-wnl, PERRLA, EACs-clear, TMs-wnl, NOSE-clear, THROAT-clear & wnl. NECK:  Supple w/ fairROM; no JVD; normal carotid impulses w/o bruits; no thyromegaly or nodules palpated; no lymphadenopathy. CHEST:  Decr BS bilat; scat wheezing/ rhonchi bilat, no rales or consolidation; s/p VATS surg scar on right & mini thoracotomy scar on left... sl tender to palp left chest wall... HEART:  Regular Rhythm; without murmurs/ rubs/ or gallops heard... ABDOMEN:  Obese w/ panniculus, soft & nontender; normal bowel sounds; no organomegaly or masses detected. EXT:  S/P left  TKR; mild arthritic changes; no varicose veins/ +venous insuffic/ tr edema. NEURO:  CN's intact;  no focal neuro deficits... sl tender over right max sinus. DERM:  No lesions noted; no rash etc...  RADIOLOGY DATA:  Reviewed in the EPIC EMR & discussed w/ the patient...  LABORATORY DATA:  Reviewed in the EPIC EMR & discussed w/ the patient...   Assessment & Plan:    COPD/ AB/ restriction from 2 surgeries>  She has severe airflow obstruction on prev PFT & we discussed the improtance of: 1) regular med regimen w/ Dulera100, Spiriva, Mucinex, etc;  2) Must lose weight;  3) must increase exercise program (she declines restart of [facility]hab)...  and I would add 4) careful swallowing, avoid asp, etc...  Hx Lung Cancer w/ 2 prev surgeries>  Followed regularly by DrBurney, DrMohammed; last CT Chest 9/13 was stable...  HBP>  Controlled on Diovan/HCT & Verapamil, & Metoprolol added for rate control by cards...  AFIB>  PAF episode treated by DrAllred w/ Eliquis & Metoprolol, back in [place]...  HYPERLIPID>  She is INTOL to all statins, on diet/ exercise/ FishOil; recent FLP remains fair & she will attempt to improve diet & lose weight...  DM>  Control is barely adeq w/ Metformin 500mg /d & A1c= 6.9; we discussed better low carb diet & exercise program...  Multinod Thyroid>  She never did call DrBalan for f/u; repeat labs show that sh is still biochem euthyroid & she has seen DrGerkin...  Obesity>  Diet + exercise are the keys here...  GERD>  Prev MBS by speech path w/o asp or penetration; she needs to eat more slowly, chew well, swallow carefully; rec to continue Prilosec, elev HOB, & NPO after dinner... Nausea & GB eval/ EGD/ etc per DrGessner & his notes reviewed...  Mult Colon Polyps>  See 6/13 colonoscopy per DrGessner...  DJD/ Osteopenia>  OK to restart the Actonel weekly...  Anxiety>  Prev on Xanax as needed, & doesn't think she needs a nerve pill; we discussed trial of KLONOPIN 0.5mg  bid to  help her breathing.Marland KitchenMarland Kitchen

## 2012-04-30 NOTE — Patient Instructions (Addendum)
Today we updated your med list in our EPIC system...    Continue your current medications the same...  We discussed your regular bronchodilator & antiinflamm regimen, plus meds to reduce the thick sputum etc...    In this regard take the [facility] twice daily...    Use the SPIRIVA once daily...    Use the [facility] Rescue inhaler just as needed...    Take the OTC MUCINEX- 2 tabs twice daily w/ lots of fluids...    Use the Delsym as needed...    Remember to take the ALPRAZOLAM 0.25mg - 1/2 to 1 tab three times daily as we discussed...  Let's plan a follow up visit in 4-6weeks, sooner if needed for problems.Marland KitchenMarland Kitchen

## 2012-05-01 ENCOUNTER — Other Ambulatory Visit: Payer: Self-pay | Admitting: *Deleted

## 2012-05-01 MED ORDER — APIXABAN 5 MG PO TABS: 5.0000 mg | ORAL_TABLET | Freq: Two times a day (BID) | ORAL | Status: DC

## 2012-05-01 NOTE — Telephone Encounter (Signed)
Pt states that she MUST have her labs faxed to [facility] at the cancer center at [phone].  Pt states that she has an appt on 05/04/12 & the HAS TO BE DONE TODAY.

## 2012-05-01 NOTE — Telephone Encounter (Signed)
Labs have been sent to Dr. Arbutus Ped at the cancer center and i called and spoke with [name]  And she is aware.

## 2012-05-02 ENCOUNTER — Other Ambulatory Visit: Payer: Self-pay | Admitting: Pulmonary Disease

## 2012-05-04 ENCOUNTER — Other Ambulatory Visit (HOSPITAL_BASED_OUTPATIENT_CLINIC_OR_DEPARTMENT_OTHER): Payer: Medicare Other | Admitting: Lab

## 2012-05-04 ENCOUNTER — Ambulatory Visit (HOSPITAL_COMMUNITY)
Admission: RE | Admit: 2012-05-04 | Discharge: 2012-05-04 | Disposition: A | Discharge status: Home / Self Care | Payer: Medicare Other | Source: Ambulatory Visit | Attending: Internal Medicine | Admitting: Internal Medicine

## 2012-05-04 ENCOUNTER — Other Ambulatory Visit: Payer: Self-pay | Admitting: Internal Medicine

## 2012-05-04 ENCOUNTER — Other Ambulatory Visit: Payer: Self-pay | Admitting: Medical Oncology

## 2012-05-04 DIAGNOSIS — Z8551 Personal history of malignant neoplasm of bladder: Secondary | ICD-10-CM | POA: Insufficient documentation

## 2012-05-04 DIAGNOSIS — I700 Atherosclerosis of aorta: Secondary | ICD-10-CM | POA: Insufficient documentation

## 2012-05-04 DIAGNOSIS — C3490 Malignant neoplasm of unspecified part of unspecified bronchus or lung: Secondary | ICD-10-CM

## 2012-05-04 DIAGNOSIS — Z902 Acquired absence of lung [part of]: Secondary | ICD-10-CM | POA: Insufficient documentation

## 2012-05-04 DIAGNOSIS — C3410 Malignant neoplasm of upper lobe, unspecified bronchus or lung: Secondary | ICD-10-CM

## 2012-05-04 DIAGNOSIS — R05 Cough: Secondary | ICD-10-CM | POA: Insufficient documentation

## 2012-05-04 DIAGNOSIS — R059 Cough, unspecified: Secondary | ICD-10-CM | POA: Insufficient documentation

## 2012-05-04 DIAGNOSIS — I2510 Atherosclerotic heart disease of native coronary artery without angina pectoris: Secondary | ICD-10-CM | POA: Insufficient documentation

## 2012-05-04 DIAGNOSIS — J984 Other disorders of lung: Secondary | ICD-10-CM | POA: Insufficient documentation

## 2012-05-04 DIAGNOSIS — J4489 Other specified chronic obstructive pulmonary disease: Secondary | ICD-10-CM | POA: Insufficient documentation

## 2012-05-04 DIAGNOSIS — J449 Chronic obstructive pulmonary disease, unspecified: Secondary | ICD-10-CM | POA: Insufficient documentation

## 2012-05-04 DIAGNOSIS — R0602 Shortness of breath: Secondary | ICD-10-CM | POA: Insufficient documentation

## 2012-05-04 DIAGNOSIS — R911 Solitary pulmonary nodule: Secondary | ICD-10-CM | POA: Insufficient documentation

## 2012-05-04 LAB — CBC WITH DIFFERENTIAL/PLATELET
Basophils Absolute: 0.1 10*3/uL (ref 0.0–0.1)
EOS%: 1.6 % (ref 0.0–7.0)
Eosinophils Absolute: 0.2 10*3/uL (ref 0.0–0.5)
HGB: 14.4 g/dL (ref 11.6–15.9)
LYMPH%: 22.9 % (ref 14.0–49.7)
MCH: 32 pg (ref 25.1–34.0)
MCV: 97.7 fL (ref 79.5–101.0)
MONO%: 7.4 % (ref 0.0–14.0)
NEUT#: 7.1 10*3/uL — ABNORMAL HIGH (ref 1.5–6.5)
NEUT%: 67.4 % (ref 38.4–76.8)
Platelets: 296 10*3/uL (ref 145–400)
RDW: 14.3 % (ref 11.2–14.5)

## 2012-05-04 LAB — COMPREHENSIVE METABOLIC PANEL (CC13)
AST: 25 U/L (ref 5–34)
Albumin: 3.6 g/dL (ref 3.5–5.0)
Alkaline Phosphatase: 44 U/L (ref 40–150)
BUN: 22.8 mg/dL (ref 7.0–26.0)
Creatinine: 0.9 mg/dL (ref 0.6–1.1)
Glucose: 141 mg/dl — ABNORMAL HIGH (ref 70–99)
Total Bilirubin: 0.51 mg/dL (ref 0.20–1.20)

## 2012-05-04 MED ORDER — IOHEXOL 300 MG/ML  SOLN
80.0000 mL | Freq: Once | INTRAMUSCULAR | Status: AC | PRN
  Administered 2012-05-04: 80 mL via INTRAVENOUS

## 2012-05-06 ENCOUNTER — Encounter: Payer: Self-pay | Admitting: Internal Medicine

## 2012-05-06 ENCOUNTER — Telehealth: Payer: Self-pay | Admitting: Pulmonary Disease

## 2012-05-06 ENCOUNTER — Telehealth: Payer: Self-pay | Admitting: Internal Medicine

## 2012-05-06 ENCOUNTER — Ambulatory Visit (HOSPITAL_BASED_OUTPATIENT_CLINIC_OR_DEPARTMENT_OTHER): Payer: Medicare Other | Admitting: Internal Medicine

## 2012-05-06 VITALS — BP 142/86 | HR 68 | Temp 98.4°F | Resp 20 | Ht 62.0 in | Wt 202.9 lb

## 2012-05-06 DIAGNOSIS — C3410 Malignant neoplasm of upper lobe, unspecified bronchus or lung: Secondary | ICD-10-CM

## 2012-05-06 DIAGNOSIS — C3490 Malignant neoplasm of unspecified part of unspecified bronchus or lung: Secondary | ICD-10-CM

## 2012-05-06 DIAGNOSIS — J449 Chronic obstructive pulmonary disease, unspecified: Secondary | ICD-10-CM

## 2012-05-06 DIAGNOSIS — R0602 Shortness of breath: Secondary | ICD-10-CM

## 2012-05-06 NOTE — Progress Notes (Signed)
[facility] [phone]   [phone]  OFFICE PROGRESS NOTE  Michele Mcalpine, MD [address] [place] [address]  PRINCIPAL DIAGNOSES:  1. 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 2. History of stage IA (T1, N0, MX) non-small cell lung cancer, adenocarcinoma with bronchoalveolar features diagnosed in November 2005 involving the right upper lobe.  PRIOR THERAPY:  1. Status post right upper lobectomy on January 03, 2004. 2. Status post wedge resection of the left upper lobe on February 06, 2009 under the care of Dr. Edwyna Shell.  CURRENT THERAPY: Observation.  INTERVAL HISTORY: [name] 78 y.o. female returns to the clinic today for routine six-month followup visit. The patient is feeling fine today with no specific complaints except for the persistent shortness of breath secondary to COPD. She was seen recently by her pulmonologist and her COPD medication has been adjusted but no significant improvement. She denied having any significant chest pain, cough or hemoptysis. She denied having any nausea or vomiting. She has no weight loss or night sweats. She had repeat CT scan of the chest performed recently and she is here for evaluation and discussion of her scan results.  MEDICAL HISTORY: Past Medical History  Diagnosis Date  . Asthma   . COPD (chronic obstructive pulmonary disease)   . History of lung cancer   . Hypertension   . Paroxysmal atrial fibrillation   . Peripheral vascular disease   . Venous insufficiency   . Hyperlipidemia   . Diabetes mellitus   . Nontoxic multinodular goiter   . Obesity   . GERD (gastroesophageal reflux disease)   . History of nephrolithiasis   . History of bladder cancer   . DJD (degenerative joint disease)   . Osteopenia   . Anxiety   . History of colon polyps 07/233/2009    Tubular Adenomas  . Diverticulosis of sigmoid colon     mild  . Gastritis     moderate  .  Hemorrhoids, internal   . Claustrophobia   . Thyroid nodule   . Chronic anticoagulation     Eliquis    ALLERGIES:  is allergic to cephalexin; codeine; epinephrine; erythromycin; morphine; and sulfamethoxazole w-trimethoprim.  MEDICATIONS:  Current Outpatient Prescriptions  Medication Sig Dispense Refill  . apixaban (ELIQUIS) 5 MG TABS tablet Take 1 tablet (5 mg total) by mouth 2 (two) times daily.  60 tablet  3  . Biotin 5000 MCG TABS Take 1 tablet by mouth daily.      . Coenzyme Q10 (COQ10) 200 MG CAPS Take 1 capsule by mouth daily.      . Cyanocobalamin (VITAMIN B 12 PO) Take by mouth. 1000 mcg once a day       . furosemide (LASIX) 20 MG tablet take 1 tablet by mouth once daily if needed for SWELLING  30 tablet  PRN  . metFORMIN (GLUCOPHAGE) 500 MG tablet take 1 tablet by mouth every morning  30 tablet  6  . methocarbamol (ROBAXIN) 500 MG tablet take 1 tablet by mouth three times a day if needed  90 tablet  3  . metoprolol tartrate (LOPRESSOR) 25 MG tablet Take 12.5 mg by mouth daily.       . mometasone-formoterol (DULERA) 100-5 MCG/ACT AERO Take 2 puffs first thing in am and then another 2 puffs about 12 hours later.  1 Inhaler  0  . Multiple Vitamin (MULTIVITAMIN PO) Take by mouth. Once a day       .  Probiotic Product (ALIGN PO) Take 1 tablet by mouth daily.      [name] SPIRIVA HANDIHALER 18 MCG inhalation capsule INHALE THE CONTENTS OF 1 CAPSULE VIA HANDIHALER ONCE DAILY.  30 each  11  . valsartan-hydrochlorothiazide (DIOVAN-HCT) 160-12.5 MG per tablet TAKE 1 TABLET BY MOUTH ONCE DAILY  30 tablet  11  . verapamil (VERELAN PM) 360 MG 24 hr capsule Take 1 capsule (360 mg total) by mouth at bedtime.  30 capsule  6  . Vitamin D, Ergocalciferol, (DRISDOL) 50000 UNITS CAPS take 1 capsule by mouth every week  4 capsule  12  . ALPRAZolam (XANAX) 0.25 MG tablet Take 1/2 to 1 tablet BID PRN  60 tablet  5   No current facility-administered medications for this visit.    SURGICAL HISTORY:  Past  Surgical History  Procedure Laterality Date  . S/p right rotator cuff repair  1991    Dr. Meade Maw, right  . S/p turbt  10/01    Dr. Vonita Moss  . S/p left tkr  2002    Dr. Darrelyn Hillock  . 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 S/p d&c for removal of endometrial polyp (benign)  12/10    GYN  . Colonoscopy    . Esophagogastroduodenoscopy      REVIEW OF SYSTEMS:  A comprehensive review of systems was negative except for: Respiratory: positive for dyspnea on exertion   PHYSICAL EXAMINATION: General appearance: alert, cooperative and no distress Head: Normocephalic, without obvious abnormality, atraumatic Neck: no adenopathy Resp: diminished breath sounds bilaterally Cardio: regular rate and rhythm, S1, S2 normal, no murmur, click, rub or gallop GI: soft, non-tender; bowel sounds normal; no masses,  no organomegaly Extremities: extremities normal, atraumatic, no cyanosis or edema  ECOG PERFORMANCE STATUS: 1 - Symptomatic but completely ambulatory  Blood pressure 142/86, pulse 68, temperature 98.4 F (36.9 C), temperature source Oral, resp. rate 20, height 5\' 2"  (1.575 m), weight 202 lb 14.4 oz (92.035 kg).  LABORATORY DATA: Lab Results  Component Value Date   WBC 10.5* 05/04/2012   HGB 14.4 05/04/2012   HCT 43.8 05/04/2012   MCV 97.7 05/04/2012   PLT 296 05/04/2012      Chemistry      Component Value Date/Time   NA 142 05/04/2012 1042   NA 142 10/14/2011 1647   K 4.5 05/04/2012 1042   K 4.2 10/14/2011 1647   CL 105 05/04/2012 1042   CL 106 10/14/2011 1647   CO2 27 05/04/2012 1042   CO2 26 10/14/2011 1647   BUN 22.8 05/04/2012 1042   BUN 23 10/14/2011 1647   CREATININE 0.9 05/04/2012 1042   CREATININE 1.0 10/14/2011 1647      Component Value Date/Time   CALCIUM 10.3 05/04/2012 1042   CALCIUM 10.0 10/14/2011 1647   ALKPHOS 44  05/04/2012 1042   ALKPHOS 35* 04/29/2012 0820   AST 25 05/04/2012 1042   AST 19 04/29/2012 0820   ALT 32 05/04/2012 1042   ALT 28 04/29/2012 0820   BILITOT 0.51 05/04/2012 1042   BILITOT 0.6 04/29/2012 0820       RADIOGRAPHIC STUDIES: Ct Chest W Contrast  05/04/2012  *RADIOLOGY REPORT*  Clinical Data: Lung cancer diagnosed 2005 and 2010, status post bilateral upper lobectomy, history of bladder cancer  CT CHEST WITH CONTRAST  Technique:  Multidetector CT imaging of the chest was performed following the standard protocol during bolus administration of intravenous contrast.  Contrast: 80mL OMNIPAQUE IOHEXOL 300 MG/ML  SOLN  Comparison: 10/18/2011  Findings: Status post bilateral upper lobectomy.  Scattered areas of postop scarring bilaterally.  Stable 4 mm ground- glass nodule in the anterior left lung (series 7/image 14).  No suspicious pulmonary nodules.  No pleural effusion or pneumothorax.  Visualized thyroid is heterogeneous.  The heart is normal in size.  Trace pericardial fluid with left ventricular apex, unchanged.  Coronary atherosclerosis. Atherosclerotic calcifications of the aortic arch.  No suspicious mediastinal, hilar, or axillary lymphadenopathy.  Visualized upper abdomen is unremarkable.  IMPRESSION: Status post bilateral upper lobectomy.  No evidence of recurrent or metastatic disease in the chest.   Original Report Authenticated By: Charline Bills, M.D.     ASSESSMENT: This is a very pleasant 78 years old white female with history of recurrent adenocarcinoma of the lung status post resection and has been observation with no evidence for disease recurrence.    PLAN: I discussed the scan results with the patient today. I recommended for her to continue on observation with repeat CT scan of the chest in 6 months.  Regarding COPD, the patient will continue on her current medication under the care of Dr. Kriste Basque and she was advised to consult with him and she is not feeling better in the  next few days.  All questions were answered. The patient knows to call the clinic with any problems, questions or concerns. We can certainly see the patient much sooner if necessary.

## 2012-05-06 NOTE — Patient Instructions (Signed)
No evidence for disease recurrence on his recent scan. Followup visit in 6 months with repeat CT scan of the chest pain.

## 2012-05-06 NOTE — Telephone Encounter (Signed)
I called pt. She stated she no longer wants to come in today. She stated she will wait and come in to see SN tomorrow. She is going to bed bc she feels exhausted and feels rest will do her some good. She has appt scheduled with SN at 11:00 AM 05/07/12. Pt was seen over at the cancer center today. Will forward to SN as an FYI pt called.  Last OV 04/30/12

## 2012-05-06 NOTE — Telephone Encounter (Signed)
Error

## 2012-05-07 ENCOUNTER — Encounter: Payer: Self-pay | Admitting: Pulmonary Disease

## 2012-05-07 ENCOUNTER — Ambulatory Visit (INDEPENDENT_AMBULATORY_CARE_PROVIDER_SITE_OTHER): Payer: Medicare Other | Admitting: Pulmonary Disease

## 2012-05-07 VITALS — BP 164/70 | HR 79 | Temp 97.3°F | Ht 62.0 in | Wt 204.0 lb

## 2012-05-07 DIAGNOSIS — J449 Chronic obstructive pulmonary disease, unspecified: Secondary | ICD-10-CM

## 2012-05-07 DIAGNOSIS — I872 Venous insufficiency (chronic) (peripheral): Secondary | ICD-10-CM

## 2012-05-07 DIAGNOSIS — I10 Essential (primary) hypertension: Secondary | ICD-10-CM

## 2012-05-07 DIAGNOSIS — C679 Malignant neoplasm of bladder, unspecified: Secondary | ICD-10-CM

## 2012-05-07 DIAGNOSIS — M1990 Unspecified osteoarthritis, unspecified site: Secondary | ICD-10-CM

## 2012-05-07 DIAGNOSIS — I4891 Unspecified atrial fibrillation: Secondary | ICD-10-CM

## 2012-05-07 DIAGNOSIS — F411 Generalized anxiety disorder: Secondary | ICD-10-CM

## 2012-05-07 DIAGNOSIS — K219 Gastro-esophageal reflux disease without esophagitis: Secondary | ICD-10-CM

## 2012-05-07 DIAGNOSIS — C3490 Malignant neoplasm of unspecified part of unspecified bronchus or lung: Secondary | ICD-10-CM

## 2012-05-07 DIAGNOSIS — M899 Disorder of bone, unspecified: Secondary | ICD-10-CM

## 2012-05-07 DIAGNOSIS — E669 Obesity, unspecified: Secondary | ICD-10-CM

## 2012-05-07 DIAGNOSIS — E119 Type 2 diabetes mellitus without complications: Secondary | ICD-10-CM

## 2012-05-07 DIAGNOSIS — E785 Hyperlipidemia, unspecified: Secondary | ICD-10-CM

## 2012-05-07 DIAGNOSIS — E042 Nontoxic multinodular goiter: Secondary | ICD-10-CM

## 2012-05-07 DIAGNOSIS — J45909 Unspecified asthma, uncomplicated: Secondary | ICD-10-CM

## 2012-05-07 MED ORDER — VITAMIN D (ERGOCALCIFEROL) 1.25 MG (50000 UNIT) PO CAPS: 50000.0000 [IU] | ORAL_CAPSULE | ORAL | Status: AC

## 2012-05-07 MED ORDER — BUDESONIDE-FORMOTEROL FUMARATE 160-4.5 MCG/ACT IN AERO: 2.0000 | INHALATION_SPRAY | Freq: Two times a day (BID) | RESPIRATORY_TRACT | Status: DC

## 2012-05-07 MED ORDER — LEVALBUTEROL HCL 0.63 MG/3ML IN NEBU
0.6300 mg | INHALATION_SOLUTION | Freq: Once | RESPIRATORY_TRACT | Status: AC
Start: 2012-05-07 — End: 2012-05-07
  Administered 2012-05-07: 0.63 mg via RESPIRATORY_TRACT

## 2012-05-07 MED ORDER — METHYLPREDNISOLONE 4 MG PO KIT: PACK | ORAL | Status: DC

## 2012-05-07 MED ORDER — AEROCHAMBER PLUS MISC: Status: DC

## 2012-05-09 ENCOUNTER — Encounter: Payer: Self-pay | Admitting: Pulmonary Disease

## 2012-05-09 NOTE — Progress Notes (Signed)
Subjective:    Patient ID: [name], female    DOB: 1934-11-09, 78 y.o.   MRN: [id number]  HPI 78 y/o WF here for a follow up visit... she has mult medical specialists who follow all of her medical problems (see below)...  ~  December 05, 2010:  10mo ROV & she notes incr SOB w/ activ, & states she never got back to her prev baseline after last hosp & upset because she missed 2 trips; she notes that she hurt her back & hasn't been exercising because of this- since then incr SOB w/ some ADLs and incr edema in her legs;  She notes that she "can't get a deep breath" & she appears more anxious than usual w/ mult somatic complaints- indigestion, cramps in hands & feet, etc...  We discussed taking the Alprazolam 0.25mg  Tid regularly for her chest wall musc spasm so she can get a good deep breath & see how this works (she is asking about Accolate because a friend is on it & it really helped them)...    COPD w/ revers component> on Advair500 but only using it once/d (she stopped [facility] stating it made her mouth sore), Spiriva, Mucinex, & Proventil HFA; asked (again) to maximize her regimen w/ AdvairBid, Mucinex2Bid, Fluids, etc; last PFT w/ FEV1=1.08 stable; see above...    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 NOTE: element of lung restriction from surg & obesity...    HBP> on DiovanHCT, Verapamil, & Lasix prn; BP= 144/80 & similar at home; she has mult somatic complaints...    Ven Insuffic> she knows to elim sodium but she eats out often, elev legs, wear support hose; she has been taking the Lasix20mg  daily due to the swelling & improved...     Hyperlipid> on Fenoglide120 & FishOil; she states INTOL to all statins; last FLP 8/12 in hosp as below; asked to ret for fasting blood work...    DM> on  Metform500/d; BS=141, A1c=7.0; she will need more meds if she can't get on diet 7 get weight down...    Obesity> wt=198# which is up 12# in the last 10mo!!! We reviewed low carb, low fat, wt reducing diet 7 exercise prescription...    Multinod Goiter> prev eval from [place] DrGerkin; TFTs showed TSH=0.97, and FreeT3/ FreeT4 are wnl...    GI> GERD, Polyps> on Prilosec & had episode of choking on dinner w/ resp exac- see speech path eval; followed by DrGessner for GI w/ 23yr f/u colonoscopy due soon...    GU> Bladder Cancer> Papilllary TCCa resected 2001 & no recurrence; followed by DrPeterson w/ yearly cystos neg...    DJD/ LBP> on musc relaxer & Advil; prev rotator cuff surg 1991, left TKR 2002, now followed by DrRamos for LBP & plans shot in back (she reports it's improved spontaneously)...    Osteopenia> on Actonel, calcium, MVI, Vit D; ?when her last BMD was done?    Anxiety> as noted- she has Alpraz for prn use but doesn't take it often...  ~  April 30, 2011:  82mo ROV & [name] has been stable w/ her severe lung disease as noted; unfortunately she still self-adjusts her meds despite my admonition to take meds regularly as prescribed for max benefit; she tells me she has "6 trips" coming up & she wants her "magic  shot" meaning DepoMedrol; we have on numerous occas discussed the importance of minimizing the cortisone Rx & maximizing her bronchdilators etc; plus we have implored her to decrease her aggressive travel schedule fearing that she will have a resp exacerbation (w/ acute resp failure) [place]... See prob list below>> LABS 3/13:  FLP- not at goal on Feno120;  Chems- ok x BS=124 A1c=6.7 on Metform monotherapy...  ~  October 31, 2011:  60mo ROV & [name] states she decided to "get healthy" & hired a Systems analyst but during her 1st session she developed an irreg tachyarrhythmia & was Dx w/ AFib & rvr> seen by [employer] & DrAllred & treated w/ Eliquis5Bid, Metoprolol25-1/2Bid,  along w/ her Verelan & DiovanHCT;  2DEcho showed modLVH, norm LVF w/ EF=55-60%, no regional wall motion abn, mildMR, mildLAdil, no ASD/PFO, PAsys=31;  Now improved & feeling better...    She was also being evaluated by DrGessner for nausea x75mo> she states nothing helps- on Zofran, Align, etc;  He did EGD/ Colon 6/13 w/ gastric polyps (hyperplastic), colon polyps (largest 22mm= tub adenoma), mild divertics, int hems;  He did AbdSonar- normal GB, no dilatation, +hepatic steatosis; Then CT Abd showed a decompressed GB, some wall thickening ?inflamm, and other findings like extensive atherosclerotic dis in abd, sm umbil hernia, & severe pelvic organ prolapse; finally HIDA scan normal; she tells me she has appt w/ DrGerkin to consider GB surg...    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 2) History of stage IA (T1, N0, MX) non-small cell lung cancer, adenocarcinoma with bronchoalveolar features diagnosed in November 2005 involving the right upper lobe. Prior Therapy: 1) Status post right upper lobectomy on January 03, 2004. 2) Status post wedge resection of the left upper lobe on February 06, 2009 under the care of Dr. Edwyna Shell. Current Therapy:  Observation    We reviewed prob list, meds, xrays and labs> see below for updates >> she will get the 2013 flu shot at [facility]...  ~  April 30, 2012:  60mo ROV & [name] has had a lot going on> saw TP 1/14 w/ resp exac c/o incr SOB, cough, wheezing, tightness off & on for 52mo; took Avelox=> Augmentin, Depo, Pred, Mucinex, Pepcid, Hydromet, etc;  Then she had OV 3/14 w/ DrWert w/ barking cough, hoarseness, etc> he stopped her Advair & changed to Dulera100, gave her Depo120 & stressed Pepcid & antireflux regimen...  She ret for 1wk f/u after changes by  DrWert but she is no better, upset that the depo didn't help this time but true-to-form she has adjusted her own meds & we discussed regimen & need for regular dosing of all meds- Dulera100-2spBid, Spiriva daily, Mucinex-2Bid, fluids, along w/ her antireflux regimen, elev HOB, Pepcid Bid, etc...    COPD w/ revers component> on Dulera100 now but only using it once/d, Spiriva, Mucinex, & Proventil HFA; asked (again) to maximize her regimen w/ Dulera100-2spBid, Mucinex2Bid, Fluids, etc; last PFT w/ FEV1=1.08 stable; see below...    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 NOTE: element of lung restriction from surg & obesity.Marland KitchenMarland Kitchen  HBP> on Metoprolol25-1/2Bid, DiovanHCT160-12.5, Verapamil360, & Lasix20 prn; BP= 140/60 & similar at home; she has mult somatic complaints...    PAFib> She had recurrent PAF 9/13 while exercising; seen by [facility] & treated w/ Eliquis5Bid, Metoprolol added, along w/ her Verelan & DiovanHCT; maintaining NSR & denies CP, palpit, dizzy, etc; tolerating Rx well- no changes made; he did Myoview 10/13 which was neg- no ischemia, normal wall motion, EF=74%...     Ven Insuffic> she knows to elim sodium but she eats out often, elev legs, wear support hose; she has been taking the Lasix20mg  daily due to the swelling & improved...     Hyperlipid> off prev Fenoglide & on FishOil, CoQ10, diet; she states INTOL to all statins; FLP 3/14 shows TChol 196, TG 84, HDL 46, LDL 133    DM> on [id number]; labs 3/14 shows BS= not done, A1c=6.9 (stable); she is advised to continue same, get on diet & get weight down...    Obesity> wt=203# which is up 5# further; We reviewed low carb, low fat, wt reducing diet & exercise prescription...    Multinod Goiter> prev eval from [place] DrGerkin; TFTs  showed TSH=0.98, and prev FreeT3/ FreeT4 were wnl...    GI> GERD, Polyps> on Prilosec/Pepcid but won't take them regularly & has hx episode of choking on dinner w/ resp exac- see prev speech path eval; followed by DrGessner for GI w/ EGD/Colon 6/13 showing mult hyperplastic polyps in stomach, & Colon w/ divertics, AVM, int hems, & numerous sm polyps (max23mm)- tub adenomas w/ repeat planned 11yr...    GU> Bladder Cancer> Papilllary TCCa resected 2001 & no recurrence; followed by DrPeterson w/ yearly cystos neg...    DJD/ LBP> on musc relaxer & Advil; prev rotator cuff surg 1991, left TKR 2002, now followed by DrRamos for LBP & plans shot in back (she reports it's improved spontaneously)...    Osteopenia> prev on Actonel (she stopped on her own), calcium, MVI, Vit D; ?when her last BMD was done?    Anxiety> as noted- she has Alpraz0.25mg  for prn use but doesn't take it often despite being asked to take it Tid regularly... We reviewed prob list, meds, xrays and labs> see below for updates >> prolonged OV requiring >69min review of data & discussion w/ pt... LABS 3/14:  FLP- improved but LDL=133 on diet alone, refuses meds;  Chems- not done as requested just LFTs=wnl;  TSH=0.98;  A1c=6.9   ~  May 07, 2012:  1wk ROV & add-on at pt request ("DrMohammed told me to come") stating no better w/ cough, congestion, SOB, and specifically notes sore mouth & tongue from the [facility] (she c/o this same complic in past when tried in 2012);  We reviewed her fixed obstructive disease & reactive airways dis component- and the need for regular dosing of all meds which address the mult components of her lung problem (ie- COPD, revers component, reflux, restriction, anxiety) but she is only concerned w/ not missing a trip to the beach this weekend...  We decided to check PFT, give her a NEB treatment, and recheck PFT (not signif reversibility found)...  We will stop the [facility], change to Symbicort160-2spBid via spacer & treat sore  mouth w/ MMW; she wants Medrol dosepak to take to the beach...  Finally I have recommended allergy/immunology eval from DrKozlow for his input ...    PFT pre-bronchodil>> FVC=1.83 (72%), FEV1=0.74 (39%), %1sec=41, mid-flows=16% predicted...    Given NEB Rx w/ Albuterol...    Repeat PFT post-bronchdil>>  FVC=1.68 (67%), FEV1=0.87 (46%), %1sec=51, mid-flows=19% predicted...    O2 sat= 93% on RA at rest, and only dropped to 92% on RA after 1lap in office w/ HR=100...  She had 675mo f/u appt w/ DrMohammed 05/06/12 for f/u of her lung cancers> CT Chest 05/04/12 showed s/p bilat upper lobe surg w/ post-op scarring, stable 4mm ground-glass nodule in ant left lung w/o change, atherosclerosis of Ao & coronaries, heterogeneous thyroid, no adenopathy... They plan another f/u in 675mo. 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 2) History of stage IA (T1, N0, MX) non-small cell lung cancer, adenocarcinoma with bronchoalveolar features diagnosed in November 2005 involving the right upper lobe. Prior Therapy: 1) Status post right upper lobectomy on January 03, 2004. 2) Status post wedge resection of the left upper lobe on February 06, 2009 under the care of Dr. Edwyna Shell. Current Therapy:  Observation         Problem List:      ASTHMA (ICD-493.90) / COPD (ICD-496) - ex-smoker, quit 1997... Back on ADVAIR500 & SPIRIVA 1/d + MUCINEX 2Bid & PROVENTIL Prn... she was participating in [facility] (last 3/09) & stopped on her own... may have a superimposed component of restriction due to obesity & prev lung surgeries... ~  11/05: s/p RULobectomy for lung ca- (adeno w/ bronchoalveolar cell features). ~  PFT 8/08 showed FVC=2.02 (77%), FEV1=1.07 (51%), %1sec=53, mid-flows=19%pred. ~  PFT 7/09 today= FVC=1.93 (72%), FEV1=1.04 (49%), %1sec=54, mid-flows=19%pred. ~  1/11:  s/p LUL resection by DrBurney> multicentric bronchoalveolar cell cancer. ~  PFT 8/12 showed  FVC=2.25 (88%), FEV1=1.08 (56%), %1sec=48, mid-flows=30% pred. ~  10/12:  She reports Dulera caused sore mouth so she switched back to Advair500 but asked to do this Bid regularly. ~  2013:  She won't stick w/ any therapy regularly & continues to decr her dosing on her own despite freq exacerbations... ~  3/14:  Now on Dulera100 per DrWert (despite prev hx of sore mouth) but only using it once/d, Spiriva, Mucinex, & Proventil HFA; asked (again) to maximize her regimen w/ Dulera100-2spBid, Mucinex2Bid, Fluids, etc; last PFT w/ FEV1=1.08 stable... ~  4/14:  Add-on appt c/o no better & sore mouth from the [facility]; treat w/ MMW & change to Symbicort160-2spBid via spacer; she requests MedrolDosepak- ok; we discussed referral to DrKozlow for his input> she has fixed obstruction, revers component, restrictive component (surg & obese), reflux component, & anxiety...  Hx of LUNG CANCER (ICD-162.9) - Hx multicentric bronchoalveolar cell lung cancer >> followed by DrBurney for CVTS & DrMohammed for Oncology... ~  s/p right upper lobectomy by DrBurney 11/05 for a stage 1A non-small cell lung cancer (adenocarcinoma w/ bronchalveolar cell features); post-op observation only... ~  CT Chest 11/08 showed no recurrence, mult bilat nodules unchanged x3+ yrs, nodular thyroid w/o change... ~  CXR 7/09 showed stable post-op changes and scarring on the right, NAD.[name]. ~  CTAngio Chest 10/09 showed neg for PE, prom thyroid, atherosclerotic changes in Ao, no change in ground-glass nodules in LUL area... ~  CT Chest 11/10 by DrMohammed showed new LUL solid nodule, no change in ground-glass areas... lesion was PET pos... ~  1/11:  s/p LULobectomy & node dissection via minithoracotomy by DrBurney- path showed 3 foci of well diff bronchoalveolar cell ca & neg nodes... decision made at conference for no chemoRx, EGFR assay was neg... ~  she continues to have monitoring by DrBurney/ DrMohammed> on observation alone now; note from  DrMohammed  9/12 indicated she was stable & he plans f/u CT Chest & OV in 50mo... ~  CT Chest 9/12 per DrMohammed showed stable ?9mm ground glass opac LUL w/o change from prev; +post op changes, COPD, coronary calcif, & hep steatosis... ~  CT Chest 9/13 per DrMohammed showed 4mm ground glass nodule ant LUL, postop bilat scarring, normal heart size w/ calcif Ao & coronaries, no adenopathy... ~  CT Chest 05/04/12 showed s/p bilat upper lobe surg w/ post-op scarring, stable 4mm ground-glass nodule in ant left lung w/o change, atherosclerosis of Ao & coronaries, heterogeneous thyroid, no adenopathy...  HYPERTENSION (ICD-401.9) - on METOPROLOL25-1/2Bid, DIOVAN/Hct 160-12.5 Daily, VERAPAMIL SR 360mg /d, & LASIX 20mg  "Prn"...  ~  Cardiac eval 9/09 by Walker Kehr was neg and 2DEcho showed DD w/ norm LVsys function, EF= 60-65%, no regional wall motion abn... ~  3/13:  BP= 122/72 & feeling OK, tolerating Rx (DiovHct 160/12.5 & Verelan240)... denies CP, palipit, dizziness, syncope, ch in dyspnea, edema, etc...  ~  9/13:  BP= 114/62 & meds adjusted in the interval w/ DiovHct 160/12.5 & Verelan360 (plus Metop25-1/2Bid now)... ~  3/14:  BP= 140/60 & similar at home; she has mult somatic complaints as noted...  PAROXYSMAL ATRIAL FIBRILLATION (ICD-427.31) - hx PAF in the post op period... converted to NSR & holding... transiently on Amiodarone in past & she wanted off Rx.  ~  Developed AFib w/ rvr 9/13 & eval by DrAllred> treated w/ Eliquis5Bid, Metoprolol25-1/2Bid, along w/ her Verelan & DiovanHCT... ~  2DEcho 9/13 showed modLVH, norm LVF w/ EF=55-60%, no regional wall motion abn, mildMR, mildLAdil, no ASD/PFO, PAsys=31 ~  Myoview 10/13 was NEG- no ischemia, normal wall motion, EF=74% ~  3/14:  She had decr the Metop & Eliquis to once daily on her own & was asked by Cards to take both Bid as directed...  PERIPHERAL VASCULAR DISEASE (ICD-443.9) - she has atherosclerotic changes in her Ao (& coronaries) noted on her prev  scans...  ~  11/10: pt had mult questions about this problem on the prob list- discussed "hardening of the arteries" in detail.  VENOUS INSUFFICIENCY (ICD-459.81) - she knows to follow a low sodium diet, elevate legs, wear support hose, etc; she insists on keeping Lasix 20mg  on hand for Prn use...  HYPERLIPIDEMIA (ICD-272.4) - prev on Livolo 2mg - 1/2 tab daily (stopped due to aching), +FENOGLIDE 120mg /d, FISH OIL & CoQ10  supplements... prev on Vytorin but INTOL ==> she states INTOL to all statins... she was not satis w/ the Lipid Clinic in the past. ~  labs 8/08 off Vytorin showed TChol 183, TG 207, HDL 46, LDL 112... try fenofibrate... ~  FLP 5/09 on Feno120 showed TChol 188, TG 111, HDL 28, LDL 137... cont same, better diet, get wt down! ~  FLP 4/10 on Feno120 showed TChol 240, TG 104, HDL 49, LDL 165... I rec f/u [facility]id Clinic, she declined. ~  FLP 7/10 on Feno120 showed TChol 222, TG 152, HDL 36, LDL 172... rec> try LIVALO 2mg - 1/2 tab Qhs, she stopped. ~  FLP 11/10 on Feno120+FishOil showed TChol 253, TG 125, HDL 42, LDL 208... try LIVALO2mg - 1/2 tab/d & stay on it. ~  FLP 1/11 on Liv1mg +Feno120 showed TChol 170, TG 101, HDL 45, LDL 105... continue same. ~  3/11: she reports aching all over & DrBurney stopped the Livolo> contin diet + other meds. ~  FLP 8/11 showed TChol 225, TG 238, HDL 41, LDL 156... INTOL all statins, she'll do the best she can  w/ diet. ~  FLP 8/12 in hosp showed TChol 234, TG 276, HDL 52, LDL 127... Counseled on low chol, low fat, wt reducing diet. ~  FLP 3/13 on Feno120 showed TChol 221, TG 85, HDL 56, LDL 159... She refuses Statin Rx or Lipid Clinic referral... ~  FLP 9/13 on diet alone showed TChol 183, TG 126, HDL 37, LDL 121 ~  FLP 3/14 on diet alone showed TChol 196, TG 84, HDL 46, LDL 133  DIABETES MELLITUS (ICD-250.00) - started on METFORMIN 500mg Bid 4/10, but decr on her own to 1 daily 1/11... we had stressed the importance of diet- low carb/ low fat and weight  reduction, along w/ her pulm rehab exercises... ~  labs 4/10 showed BS= 131, HgA1c= 7.0.Marland KitchenMarland Kitchen Metformin500Bid started. ~  labs 7/10 showed BS= 134, A1c= 6.0 ~  labs 11/10 showed BS= 148, A1c= 6.5 ~  1/11:  she cut the Metformin to 1/d due to nausea. ~  labs 8/11 showed BS= 137, A1c= 5.7.Marland KitchenMarland Kitchen continue same, get wt down. ~  Labs 8/12 in hosp on Metform500/d showed BS= 110-230, A1c=6.5 ~  Labs 10/12 on Metform500/d showed BS= 141, A1c=7.0.Marland KitchenMarland Kitchen Needs better diet, get wt down, or more meds. ~  1/13:  Ophthalmology check up by DrMcCuen> neg- no diabetic retinopathy... ~  Labs 3/13 on Metform500/d showed BS= 124, A1c= 6.7 ~  Labs 3/14 on Metform500/d showed BS= not done, A1c= 6.9  NONTOXIC MULTINODULAR GOITER (ICD-241.1) - eval and rx by DrBalan for Endocrinology & DrGerkin for CCS; she is asymptomatic; dominant nodule was needled and benign; surg consult from DrGerkin- elected observation & he checks her yearly... ~  seen 6/10 by DrGerkin- f/u sonar w/o change in any of the nodules... ~  labs 7/10 showed TSH= 0.89 ~  seen 6/11 by DrGerkin- f/u sonar w/o change in nodules. ~  Labs 8/12 showed TSH= 0.047... Not on Thyroid meds, needs f/u DrBalan (she never went). ~  Labs 10/12 showed TSH= 0.97, FreeT3= 3.2 (2.3-4.2), FreeT4= 0.88 (0.60-1.60) ~  Labs 7/13 showed TSH= 0.68 ~  Labs 3/14 showed TSH= 0.98  OBESITY (ICD-278.00) - obese w/ abd panniculus & we discussed diet + exercise strategies.[name]. ~  weight up to 205# 11/09- we discussed diet, calorie restriction, exercise, & get the weight down... ~  weight 4/10 = 198# ~  weight 7/10 = 194# ~  weight 11/10 = 196# ~  weight 2/11 = 184# (post op) ~  weight 8/11 = 181# ~  weight 11/11 = 186#... she needs to do better! ~  Weight 8/12 = 186# ~  Weight 10/12 = 198#... What happened? ~  Weight 3/13 = 189#... Keep up the good work! ~  Weight 9/13 = 184# ~  Weight 3/14 = 203#  GERD (ICD-530.81) - on PRILOSEC 20mg /d... ~  EDG 6/13 by DrGessner showed mult  gastric polyps (hyperplastic), mod gastritis, & treated w/ Omep20mg /d (she subseq stopped on her own)...  COLON POLYPS, DIVERTICULOSIS, HEMORRHOIDS >> ~  colonoscopy 7/09 by DrGessner showed 4 sm polyps= tubular adenoma on bx... f/u planned 3 yrs... ~  Colonoscopy 6/13 by DrGessner showed mult sm polyps, largest 75mm= tub adenoma, plus mild diverticvs, int hems, etc... ~  CT Abd 9/13 revealed GB to be decompressed, ?wall thickening & ?chr inflamm, extensive atherosclerosis in abd vasculature- consider CTA, sm umbil hernia, severe pelvic organ prolapse;  => HIDA scan 9/13 was neg, wnl...  She has recurrent nausea for which she has seen DrGesnner & DrGerkin...  NEPHROLITHIASIS, HX  OF (ICD-V13.01)  Hx of BLADDER CANCER (ICD-188.9) - had hematuria in 2001 & referred to DrPeterson... 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 all subseq cystoscopies have been neg- no recurrence. ~  she reports f/u w/ DrPeterson yearly & doing satis by her report.  DEGENERATIVE JOINT DISEASE (ICD-715.90) - s/p rotator cuff repair in 1991... s/p left TKR from DrGioffre in 2002...  OSTEOPENIA (ICD-733.90) - off prev ACTONEL 150/mo for a drug holiday, ca++, MVI, etc... ~  I cannot find baseline BMD in EPIC or cone system> ?done by GYN? ~  labs 8/11 showed Vit D level = 22... rec> start Vit D supplement extra 02-1998 u daily... ~  2013:  Actonel removed from her list by DrGessner for drug holiday...  ANXIETY (ICD-300.00) - on XANAX 0.25mg  Prn... she has signif hx of claustrophobia, but denies that she has any anxiety...   Past Surgical History  Procedure Laterality Date  . S/p right rotator cuff repair  1991    Dr. Meade Maw, right  . S/p turbt  10/01    Dr. Vonita Moss  . S/p left tkr  2002    Dr. Darrelyn Hillock  . 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 S/p left upper lobectomy and node dissection 1/11  1/11     Dr. Edwyna Shell (multicentric bronchoalveolar cell ca)  . S/p d&c for removal of endometrial polyp (benign)  12/10    GYN  . Colonoscopy    . Esophagogastroduodenoscopy      Outpatient Encounter Prescriptions as of 05/07/2012  Medication Sig Dispense Refill  . ALPRAZolam (XANAX) 0.25 MG tablet Take 1/2 to 1 tablet BID PRN  60 tablet  5  . apixaban (ELIQUIS) 5 MG TABS tablet Take 1 tablet (5 mg total) by mouth 2 (two) times daily.  60 tablet  3  . Biotin 5000 MCG TABS Take 1 tablet by mouth daily.      . Coenzyme Q10 (COQ10) 200 MG CAPS Take 1 capsule by mouth daily.      . Cyanocobalamin (VITAMIN B 12 PO) Take by mouth. 1000 mcg once a day       . furosemide (LASIX) 20 MG tablet take 1 tablet by mouth once daily if needed for SWELLING  30 tablet  PRN  . metFORMIN (GLUCOPHAGE) 500 MG tablet take 1 tablet by mouth every morning  30 tablet  6  . methocarbamol (ROBAXIN) 500 MG tablet take 1 tablet by mouth three times a day if needed  90 tablet  3  . metoprolol tartrate (LOPRESSOR) 25 MG tablet Take 12.5 mg by mouth daily.       . Multiple Vitamin (MULTIVITAMIN PO) Take by mouth. Once a day       . Probiotic Product (ALIGN PO) Take 1 tablet by mouth daily.      [name] SPIRIVA HANDIHALER 18 MCG inhalation capsule INHALE THE CONTENTS OF 1 CAPSULE VIA HANDIHALER ONCE DAILY.  30 each  11  . valsartan-hydrochlorothiazide (DIOVAN-HCT) 160-12.5 MG per tablet TAKE 1 TABLET BY MOUTH ONCE DAILY  30 tablet  11  . verapamil (VERELAN PM) 360 MG 24 hr capsule Take 1 capsule (360 mg total) by mouth at bedtime.  30 capsule  6  . Vitamin D, Ergocalciferol, (DRISDOL) 50000 UNITS CAPS Take 1 capsule (50,000 Units total) by mouth every 7 (seven) days.  4 capsule  12  . [DISCONTINUED] mometasone-formoterol (DULERA)  100-5 MCG/ACT AERO Take 2 puffs first thing in am and then another 2 puffs about 12 hours later.  1 Inhaler  0  . [DISCONTINUED] Vitamin D, Ergocalciferol, (DRISDOL) 50000 UNITS CAPS take 1 capsule by mouth every  week  4 capsule  12  . budesonide-formoterol (SYMBICORT) 160-4.5 MCG/ACT inhaler Inhale 2 puffs into the lungs 2 (two) times daily.  1 Inhaler  6  . methylPREDNISolone (MEDROL, PAK,) 4 MG tablet follow package directions  21 tablet  0  . Spacer/Aero-Holding Chambers (AEROCHAMBER PLUS) inhaler Use as instructed  1 each  2  . [EXPIRED] levalbuterol (XOPENEX) nebulizer solution 0.63 mg        No facility-administered encounter medications on file as of 05/07/2012.    Allergies                                          Pt states INTOL to ALL STATINS...  Allergen Reactions  . Cephalexin     REACTION: nausea and diarrhea  . Codeine     REACTION: nausea  . Epinephrine     REACTION: nervous  . Erythromycin     REACTION: all mycins cause yeast infections  . Morphine     REACTION: nausea  . Sulfamethoxazole W/Trimethoprim     REACTION: nausea and diarrhea    Current Medications, Allergies, Past Medical History, Past Surgical History, Family History, and Social History were reviewed in [employer] record.    Review of Systems         See HPI - all other systems neg except as noted...  The patient complains of hoarseness, dyspnea on exertion, headaches, muscle weakness, and difficulty walking.  The patient denies anorexia, fever, weight loss, weight gain, vision loss, decreased hearing, chest pain, syncope, peripheral edema, prolonged cough, hemoptysis, abdominal pain, melena, hematochezia, severe indigestion/heartburn, hematuria, incontinence, suspicious skin lesions, transient blindness, depression, unusual weight change, abnormal bleeding, enlarged lymph nodes, and angioedema.     Objective:   Physical Exam     WD, WN, 78 y/o  WF in NAD... GENERAL:  Alert & oriented; pleasant & cooperative... HEENT:  [place]/AT, EOM-wnl, PERRLA, EACs-clear, TMs-wnl, NOSE-clear, THROAT- no thrush, sm aphthous ulcer noted... NECK:  Supple w/ fairROM; no JVD; normal carotid impulses w/o  bruits; no thyromegaly or nodules palpated; no lymphadenopathy. CHEST:  Decr BS bilat; scat wheezing/ rhonchi bilat, no rales or consolidation; s/p VATS surg scar on right & mini thoracotomy scar on left... sl tender to palp left chest wall... HEART:  Regular Rhythm; without murmurs/ rubs/ or gallops heard... ABDOMEN:  Obese w/ panniculus, soft & nontender; normal bowel sounds; no organomegaly or masses detected. EXT:  S/P left TKR; mild arthritic changes; no varicose veins/ +venous insuffic/ tr edema. NEURO:  CN's intact;  no focal neuro deficits... sl tender over right max sinus. DERM:  No lesions noted; no rash etc...  RADIOLOGY DATA:  Reviewed in the EPIC EMR & discussed w/ the patient...  LABORATORY DATA:  Reviewed in the EPIC EMR & discussed w/ the patient...   Assessment & Plan:    COPD/ AB/ restriction from 2 surgeries>  She has severe airflow obstruction on PFT & we discussed the improtance of: 1) regular med regimen w/ Symbicort160 (use spacer), Spiriva, Mucinex, etc;  2) Must lose weight;  3) must increase exercise program (she declines restart of PulmRehab)...  and I would add  4) antireflux regimen- careful swallowing, avoid asp, etc... We discussed additional eval & Rx from DrKozlow...  Hx Lung Cancer w/ 2 prev surgeries>  Followed regularly by DrBurney, DrMohammed; last CT Chest 3/14 was stable...  HBP>  Controlled on Diovan/HCT & Verapamil, & Metoprolol added for rate control by cards...  AFIB>  PAF episode treated by DrAllred w/ Eliquis & Metoprolol (12.5Bid), back in NSR...  HYPERLIPID>  She is INTOL to all statins, on diet/ exercise/ FishOil; recent FLP remains fair & she will attempt to improve diet & lose weight...  DM>  Control is barely adeq w/ Metformin 500mg /d & A1c= 6.9; we discussed better low carb diet & exercise program...  Multinod Thyroid>  She never did call DrBalan for f/u; repeat labs show that sh is still biochem euthyroid & she has seen  DrGerkin...  Obesity>  Diet + exercise are the keys here...  GERD>  Prev MBS by speech path w/o asp or penetration; she needs to eat more slowly, chew well, swallow carefully; rec to continue Prilosec, elev HOB, & NPO after dinner... Nausea & GB eval/ EGD/ etc per DrGessner & his notes reviewed...  Mult Colon Polyps>  See 6/13 colonoscopy per DrGessner...  DJD/ Osteopenia>  OK to restart the Actonel weekly...  Anxiety>  Prev on Xanax as needed, & doesn't think she needs a nerve pill; we discussed trial of KLONOPIN 0.5mg  bid to help her breathing...   Patient's Medications  New Prescriptions   BUDESONIDE-FORMOTEROL (SYMBICORT) 160-4.5 MCG/ACT INHALER    Inhale 2 puffs into the lungs 2 (two) times daily.   METHYLPREDNISOLONE (MEDROL, PAK,) 4 MG TABLET    follow package directions   SPACER/AERO-HOLDING CHAMBERS (AEROCHAMBER PLUS) INHALER    Use as instructed  Previous Medications   ALPRAZOLAM (XANAX) 0.25 MG TABLET    Take 1/2 to 1 tablet BID PRN   APIXABAN (ELIQUIS) 5 MG TABS TABLET    Take 1 tablet (5 mg total) by mouth 2 (two) times daily.   BIOTIN 5000 MCG TABS    Take 1 tablet by mouth daily.   COENZYME Q10 (COQ10) 200 MG CAPS    Take 1 capsule by mouth daily.   CYANOCOBALAMIN (VITAMIN B 12 PO)    Take by mouth. 1000 mcg once a day    FUROSEMIDE (LASIX) 20 MG TABLET    take 1 tablet by mouth once daily if needed for SWELLING   METFORMIN (GLUCOPHAGE) 500 MG TABLET    take 1 tablet by mouth every morning   METHOCARBAMOL (ROBAXIN) 500 MG TABLET    take 1 tablet by mouth three times a day if needed   METOPROLOL TARTRATE (LOPRESSOR) 25 MG TABLET    Take 12.5 mg by mouth daily.    MULTIPLE VITAMIN (MULTIVITAMIN PO)    Take by mouth. Once a day    PROBIOTIC PRODUCT (ALIGN PO)    Take 1 tablet by mouth daily.   SPIRIVA HANDIHALER 18 MCG INHALATION CAPSULE    INHALE THE CONTENTS OF 1 CAPSULE VIA HANDIHALER ONCE DAILY.   VALSARTAN-HYDROCHLOROTHIAZIDE (DIOVAN-HCT) 160-12.5 MG PER TABLET    TAKE  1 TABLET BY MOUTH ONCE DAILY   VERAPAMIL (VERELAN PM) 360 MG 24 HR CAPSULE    Take 1 capsule (360 mg total) by mouth at bedtime.  Modified Medications   Modified Medication Previous Medication   VITAMIN D, ERGOCALCIFEROL, (DRISDOL) 50000 UNITS CAPS Vitamin D, Ergocalciferol, (DRISDOL) 50000 UNITS CAPS      Take 1 capsule (50,000 Units total) by  mouth every 7 (seven) days.    take 1 capsule by mouth every week  Discontinued Medications   MOMETASONE-FORMOTEROL (DULERA) 100-5 MCG/ACT AERO    Take 2 puffs first thing in am and then another 2 puffs about 12 hours later.

## 2012-05-09 NOTE — Patient Instructions (Addendum)
Today we updated your med list in our EPIC system...    Continue your current medications the same...  We Decided to change the [facility] to SYMBICORT160- 2sprays twice daily + use the Spacer device...  We wrote for a Medrol dosepak per your request...  You must be diligent about using all your meds as prescribed on a regular dosing schedule to properly reverse all the components of your lung disease...  We will arrange for an Allergy/ Immunology consult w/ DrKozlow...  Let's plan a follow up visit in 6weeks, sooner if needed for problems.Marland KitchenMarland Kitchen

## 2012-06-01 ENCOUNTER — Other Ambulatory Visit (HOSPITAL_COMMUNITY): Payer: Self-pay | Admitting: Otolaryngology

## 2012-06-01 ENCOUNTER — Ambulatory Visit (HOSPITAL_COMMUNITY)
Admission: RE | Admit: 2012-06-01 | Discharge: 2012-06-01 | Disposition: A | Discharge status: Home / Self Care | Payer: Medicare Other | Source: Ambulatory Visit | Attending: Otolaryngology | Admitting: Otolaryngology

## 2012-06-01 DIAGNOSIS — I517 Cardiomegaly: Secondary | ICD-10-CM | POA: Insufficient documentation

## 2012-06-01 DIAGNOSIS — R05 Cough: Secondary | ICD-10-CM

## 2012-06-01 DIAGNOSIS — R059 Cough, unspecified: Secondary | ICD-10-CM | POA: Insufficient documentation

## 2012-06-01 DIAGNOSIS — R0989 Other specified symptoms and signs involving the circulatory and respiratory systems: Secondary | ICD-10-CM

## 2012-06-01 DIAGNOSIS — R0609 Other forms of dyspnea: Secondary | ICD-10-CM

## 2012-06-01 DIAGNOSIS — R0602 Shortness of breath: Secondary | ICD-10-CM | POA: Insufficient documentation

## 2012-06-10 ENCOUNTER — Telehealth: Payer: Self-pay | Admitting: Internal Medicine

## 2012-06-10 NOTE — Telephone Encounter (Signed)
I will not be able to see her.

## 2012-06-10 NOTE — Telephone Encounter (Signed)
Dr. Brodie will you accept? 

## 2012-06-10 NOTE — Telephone Encounter (Signed)
Patient advised She will think about what she wants to do.  She will call back

## 2012-06-12 ENCOUNTER — Telehealth: Payer: Self-pay | Admitting: Pulmonary Disease

## 2012-06-12 NOTE — Telephone Encounter (Signed)
Called and spoke with pt and she stated that she saw Dr. Otelia Sergeant and he told her that she will need a PNA vaccine due to her immune system.  She stated that she has an appt with Dr. Otelia Sergeant on Tuesday and they are not able to give these shots and they told her that she would have to get this from out office.  SN please advise, if you do rec this vaccine, pt would like to come in on Tuesday afternoon for this vaccine.  Allergies  Allergen Reactions  . Cephalexin     REACTION: nausea and diarrhea  . Codeine     REACTION: nausea  . Epinephrine     REACTION: nervous, convulsions  . Erythromycin     REACTION: all mycins cause yeast infections  . Morphine     REACTION: nausea  . Sulfamethoxazole W-Trimethoprim     REACTION: nausea and diarrhea

## 2012-06-12 NOTE — Telephone Encounter (Signed)
Per SN---ok to come in on Tuesday for PNA vaccine per Dr. Otelia Sergeant recs. Pt is aware and will be here by 1 on Tuesday.

## 2012-06-16 ENCOUNTER — Ambulatory Visit (INDEPENDENT_AMBULATORY_CARE_PROVIDER_SITE_OTHER): Payer: Medicare Other

## 2012-06-16 DIAGNOSIS — Z23 Encounter for immunization: Secondary | ICD-10-CM

## 2012-06-19 ENCOUNTER — Other Ambulatory Visit: Payer: Self-pay | Admitting: *Deleted

## 2012-06-19 MED ORDER — VERAPAMIL HCL ER 360 MG PO CP24: 360.0000 mg | ORAL_CAPSULE | Freq: Every day | ORAL | Status: DC

## 2012-07-09 ENCOUNTER — Telehealth: Payer: Self-pay | Admitting: Pulmonary Disease

## 2012-07-09 NOTE — Telephone Encounter (Signed)
Per SN---   She had full labs on 10/2011 and 04/2012.   Not needed again until 10/2012--unless there is something that she wants rechecked now.  thanks

## 2012-07-09 NOTE — Telephone Encounter (Signed)
Pt has pending appt 07/14/12 Last OV 05/07/12. Please advise what labs pt will need thanks SN

## 2012-07-09 NOTE — Telephone Encounter (Signed)
Pt advised. Jennifer Castillo, CMA  

## 2012-07-14 ENCOUNTER — Telehealth: Payer: Self-pay | Admitting: Pulmonary Disease

## 2012-07-14 ENCOUNTER — Encounter: Payer: Self-pay | Admitting: Pulmonary Disease

## 2012-07-14 ENCOUNTER — Ambulatory Visit (INDEPENDENT_AMBULATORY_CARE_PROVIDER_SITE_OTHER): Payer: Medicare Other | Admitting: Pulmonary Disease

## 2012-07-14 VITALS — BP 122/70 | HR 88 | Temp 98.5°F | Ht 61.0 in | Wt 202.6 lb

## 2012-07-14 DIAGNOSIS — J45909 Unspecified asthma, uncomplicated: Secondary | ICD-10-CM

## 2012-07-14 DIAGNOSIS — J449 Chronic obstructive pulmonary disease, unspecified: Secondary | ICD-10-CM

## 2012-07-14 DIAGNOSIS — K219 Gastro-esophageal reflux disease without esophagitis: Secondary | ICD-10-CM

## 2012-07-14 DIAGNOSIS — E119 Type 2 diabetes mellitus without complications: Secondary | ICD-10-CM

## 2012-07-14 DIAGNOSIS — C3490 Malignant neoplasm of unspecified part of unspecified bronchus or lung: Secondary | ICD-10-CM

## 2012-07-14 DIAGNOSIS — E785 Hyperlipidemia, unspecified: Secondary | ICD-10-CM

## 2012-07-14 DIAGNOSIS — F411 Generalized anxiety disorder: Secondary | ICD-10-CM

## 2012-07-14 DIAGNOSIS — J4489 Other specified chronic obstructive pulmonary disease: Secondary | ICD-10-CM

## 2012-07-14 DIAGNOSIS — M1990 Unspecified osteoarthritis, unspecified site: Secondary | ICD-10-CM

## 2012-07-14 DIAGNOSIS — I10 Essential (primary) hypertension: Secondary | ICD-10-CM

## 2012-07-14 DIAGNOSIS — I4891 Unspecified atrial fibrillation: Secondary | ICD-10-CM

## 2012-07-14 DIAGNOSIS — E042 Nontoxic multinodular goiter: Secondary | ICD-10-CM

## 2012-07-14 DIAGNOSIS — E669 Obesity, unspecified: Secondary | ICD-10-CM

## 2012-07-14 MED ORDER — METHYLPREDNISOLONE ACETATE 80 MG/ML IJ SUSP
80.0000 mg | Freq: Once | INTRAMUSCULAR | Status: AC
  Administered 2012-07-14: 80 mg via INTRAMUSCULAR

## 2012-07-14 MED ORDER — METHYLPREDNISOLONE 4 MG PO KIT: PACK | ORAL | Status: DC

## 2012-07-14 NOTE — Patient Instructions (Addendum)
Today we updated your med list in our EPIC system...    Continue your current medications the same...  We gave you a Depo shot & a new prescription for a Medrol dosepak as requested...  Let's plan a follow up visit in 3-14mo, sooner if needed for problems.Marland KitchenMarland Kitchen

## 2012-07-14 NOTE — Telephone Encounter (Signed)
Pt decided not to leave a message at this time.  Holly D Pryor ° °

## 2012-07-14 NOTE — Progress Notes (Signed)
Subjective:    Patient ID: [name], female    DOB: [birth year], 78 y.o.   MRN: [id number]  HPI 78 y/o WF here for a follow up visit... she has mult medical specialists who follow all of her medical problems (see below)...  ~  December 05, 2010:  29mo ROV & she notes incr SOB w/ activ, & states she never got back to her prev baseline after last hosp & upset because she missed 2 trips; she notes that she hurt her back & hasn't been exercising because of this- since then incr SOB w/ some ADLs and incr edema in her legs;  She notes that she "can't get a deep breath" & she appears more anxious than usual w/ mult somatic complaints- indigestion, cramps in hands & feet, etc...  We discussed taking the Alprazolam 0.25mg  Tid regularly for her chest wall musc spasm so she can get a good deep breath & see how this works (she is asking about Accolate because a friend is on it & it really helped them)...    COPD w/ revers component> on Advair500 but only using it once/d (she stopped [facility] stating it made her mouth sore), Spiriva, Mucinex, & Proventil HFA; asked (again) to maximize her regimen w/ AdvairBid, Mucinex2Bid, Fluids, etc; last PFT w/ FEV1=1.08 stable; see above...    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 NOTE: element of lung restriction from surg & obesity...    HBP> on DiovanHCT, Verapamil, & Lasix prn; BP= 144/80 & similar at home; she has mult somatic complaints...    Ven Insuffic> she knows to elim sodium but she eats out often, elev legs, wear support hose; she has been taking the Lasix20mg  daily due to the swelling & improved...     Hyperlipid> on Fenoglide120 & FishOil; she states INTOL to all statins; last FLP 8/12 in hosp as below; asked to ret for fasting blood work...    DM> on  Metform500/d; BS=141, A1c=7.0; she will need more meds if she can't get on diet 7 get weight down...    Obesity> wt=198# which is up 12# in the last 29mo!!! We reviewed low carb, low fat, wt reducing diet 7 exercise prescription...    Multinod Goiter> prev eval from [facility] DrGerkin; TFTs showed TSH=0.97, and FreeT3/ FreeT4 are wnl...    GI> GERD, Polyps> on Prilosec & had episode of choking on dinner w/ resp exac- see speech path eval; followed by DrGessner for GI w/ 57yr f/u colonoscopy due soon...    GU> Bladder Cancer> Papilllary TCCa resected 2001 & no recurrence; followed by DrPeterson w/ yearly cystos neg...    DJD/ LBP> on musc relaxer & Advil; prev rotator cuff surg 1991, left TKR 2002, now followed by DrRamos for LBP & plans shot in back (she reports it's improved spontaneously)...    Osteopenia> on Actonel, calcium, MVI, Vit D; ?when her last BMD was done?    Anxiety> as noted- she has Alpraz for prn use but doesn't take it often...  ~  April 30, 2011:  76mo ROV & [name] has been stable w/ her severe lung disease as noted; unfortunately she still self-adjusts her meds despite my admonition to take meds regularly as prescribed for max benefit; she tells me she has "6 trips" coming up & she wants her "magic  shot" meaning DepoMedrol; we have on numerous occas discussed the importance of minimizing the cortisone Rx & maximizing her bronchdilators etc; plus we have implored her to decrease her aggressive travel schedule fearing that she will have a resp exacerbation (w/ acute resp failure) [place]... See prob list below>> LABS 3/13:  FLP- not at goal on Feno120;  Chems- ok x BS=124 A1c=6.7 on Metform monotherapy...  ~  October 31, 2011:  763mo ROV & [name] states she decided to "get healthy" & hired a Systems analyst but during her 1st session she developed an irreg tachyarrhythmia & was Dx w/ AFib & rvr> seen by [employer] & DrAllred & treated w/ Eliquis5Bid, Metoprolol25-1/2Bid,  along w/ her Verelan & DiovanHCT;  2DEcho showed modLVH, norm LVF w/ EF=55-60%, no regional wall motion abn, mildMR, mildLAdil, no ASD/PFO, PAsys=31;  Now improved & feeling better...    She was also being evaluated by DrGessner for nausea x80mo> she states nothing helps- on Zofran, Align, etc;  He did EGD/ Colon 6/13 w/ gastric polyps (hyperplastic), colon polyps (largest 88mm= tub adenoma), mild divertics, int hems;  He did AbdSonar- normal GB, no dilatation, +hepatic steatosis; Then CT Abd showed a decompressed GB, some wall thickening ?inflamm, and other findings like extensive atherosclerotic dis in abd, sm umbil hernia, & severe pelvic organ prolapse; finally HIDA scan normal; she tells me she has appt w/ DrGerkin to consider GB surg...    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 2) History of stage IA (T1, N0, MX) non-small cell lung cancer, adenocarcinoma with bronchoalveolar features diagnosed in November 2005 involving the right upper lobe. Prior Therapy: 1) Status post right upper lobectomy on January 03, 2004. 2) Status post wedge resection of the left upper lobe on February 06, 2009 under the care of Dr. Edwyna Shell. Current Therapy:  Observation    We reviewed prob list, meds, xrays and labs> see below for updates >> she will get the 2013 flu shot at [facility]...  ~  April 30, 2012:  763mo ROV & [name] has had a lot going on> saw TP 1/14 w/ resp exac c/o incr SOB, cough, wheezing, tightness off & on for 74mo; took Avelox=> Augmentin, Depo, Pred, Mucinex, Pepcid, Hydromet, etc;  Then she had OV 76/14 w/ DrWert w/ barking cough, hoarseness, etc> he stopped her Advair & changed to Dulera100, gave her Depo120 & stressed Pepcid & antireflux regimen...  She ret for 1wk f/u after changes by  DrWert but she is no better, upset that the depo didn't help this time but true-to-form she has adjusted her own meds & we discussed regimen & need for regular dosing of all meds- Dulera100-2spBid, Spiriva daily, Mucinex-2Bid, fluids, along w/ her antireflux regimen, elev HOB, Pepcid Bid, etc...    COPD w/ revers component> on Dulera100 now but only using it once/d, Spiriva, Mucinex, & Proventil HFA; asked (again) to maximize her regimen w/ Dulera100-2spBid, Mucinex2Bid, Fluids, etc; last PFT w/ FEV1=1.08 stable; see below...    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 NOTE: element of lung restriction from surg & obesity.Marland KitchenMarland Kitchen  HBP> on Metoprolol25-1/2Bid, DiovanHCT160-12.5, Verapamil360, & Lasix20 prn; BP= 140/60 & similar at home; she has mult somatic complaints...    PAFib> She had recurrent PAF 9/13 while exercising; seen by [facility] & treated w/ Eliquis5Bid, Metoprolol added, along w/ her Verelan & DiovanHCT; maintaining NSR & denies CP, palpit, dizzy, etc; tolerating Rx well- no changes made; he did Myoview 10/13 which was neg- no ischemia, normal wall motion, EF=74%...     Ven Insuffic> she knows to elim sodium but she eats out often, elev legs, wear support hose; she has been taking the Lasix20mg  daily due to the swelling & improved...     Hyperlipid> off prev Fenoglide & on FishOil, CoQ10, diet; she states INTOL to all statins; FLP 3/14 shows TChol 196, TG 84, HDL 46, LDL 133    DM> on [id number]; labs 3/14 shows BS= not done, A1c=6.9 (stable); she is advised to continue same, get on diet & get weight down...    Obesity> wt=203# which is up 5# further; We reviewed low carb, low fat, wt reducing diet & exercise prescription...    Multinod Goiter> prev eval from [facility] DrGerkin; TFTs  showed TSH=0.98, and prev FreeT3/ FreeT4 were wnl...    GI> GERD, Polyps> on Prilosec/Pepcid but won't take them regularly & has hx episode of choking on dinner w/ resp exac- see prev speech path eval; followed by DrGessner for GI w/ EGD/Colon 6/13 showing mult hyperplastic polyps in stomach, & Colon w/ divertics, AVM, int hems, & numerous sm polyps (max34mm)- tub adenomas w/ repeat planned 64yr...    GU> Bladder Cancer> Papilllary TCCa resected 2001 & no recurrence; followed by DrPeterson w/ yearly cystos neg...    DJD/ LBP> on musc relaxer & Advil; prev rotator cuff surg 1991, left TKR 2002, now followed by DrRamos for LBP & plans shot in back (she reports it's improved spontaneously)...    Osteopenia> prev on Actonel (she stopped on her own), calcium, MVI, Vit D; ?when her last BMD was done?    Anxiety> as noted- she has Alpraz0.25mg  for prn use but doesn't take it often despite being asked to take it Tid regularly... We reviewed prob list, meds, xrays and labs> see below for updates >> prolonged OV requiring >67min review of data & discussion w/ pt... LABS 3/14:  FLP- improved but LDL=133 on diet alone, refuses meds;  Chems- not done as requested just LFTs=wnl;  TSH=0.98;  A1c=6.9   ~  May 07, 2012:  1wk ROV & add-on at pt request ("DrMohammed told me to come") stating no better w/ cough, congestion, SOB, and specifically notes sore mouth & tongue from the [facility] (she c/o this same complic in past when tried in 2012);  We reviewed her fixed obstructive disease & reactive airways dis component- and the need for regular dosing of all meds which address the mult components of her lung problem (ie- COPD, revers component, reflux, restriction, anxiety) but she is only concerned w/ not missing a trip to the beach this weekend...  We decided to check PFT, give her a NEB treatment, and recheck PFT (not signif reversibility found)...  We will stop the [facility], change to Symbicort160-2spBid via spacer & treat sore  mouth w/ MMW; she wants Medrol dosepak to take to the beach...  Finally I have recommended allergy/immunology eval from DrKozlow for his input ...    PFT pre-bronchodil>> FVC=1.83 (72%), FEV1=0.74 (39%), %1sec=41, mid-flows=16% predicted...    Given NEB Rx w/ Albuterol...    Repeat PFT post-bronchdil>>  FVC=1.68 (67%), FEV1=0.87 (46%), %1sec=51, mid-flows=19% predicted...    O2 sat= 93% on RA at rest, and only dropped to 92% on RA after 1lap in office w/ HR=100...  She had 46mo f/u appt w/ DrMohammed 05/06/12 for f/u of her lung cancers> CT Chest 05/04/12 showed s/p bilat upper lobe surg w/ post-op scarring, stable 4mm ground-glass nodule in ant left lung w/o change, atherosclerosis of Ao & coronaries, heterogeneous thyroid, no adenopathy... They plan another f/u in 46mo. 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 2) History of stage IA (T1, N0, MX) non-small cell lung cancer, adenocarcinoma with bronchoalveolar features diagnosed in November 2005 involving the right upper lobe. Prior Therapy: 1) Status post right upper lobectomy on January 03, 2004. 2) Status post wedge resection of the left upper lobe on February 06, 2009 under the care of Dr. Edwyna Shell. Current Therapy:  Observation  ~  July 14, 2012:  21mo ROV & Carnita reports that she is better- best she's been in several months, on NEBS per DrKozlow ("I really like him"), +QVar via Aerochamber, had ONO & started on Oxygen 1.5L Qhs (she checks her own oximetry now); despite her improvement- she still wants a Depo shot prior to her upcoming trip "very active & I don't want to miss a thing";  DrKozlow sent her to [facility] for ENT eval & this was neg per pt...    We reviewed prob list, meds, xrays and labs> see below for updates >>            Problem List:      ASTHMA (ICD-493.90) / COPD (ICD-496) - ex-smoker, quit 1997... Back on ADVAIR500 & SPIRIVA 1/d + MUCINEX 2Bid & PROVENTIL Prn... she was  participating in [facility] (last 3/09) & stopped on her own... may have a superimposed component of restriction due to obesity & prev lung surgeries... ~  11/05: s/p RULobectomy for lung ca- (adeno w/ bronchoalveolar cell features). ~  PFT 8/08 showed FVC=2.02 (77%), FEV1=1.07 (51%), %1sec=53, mid-flows=19%pred. ~  PFT 7/09 today= FVC=1.93 (72%), FEV1=1.04 (49%), %1sec=54, mid-flows=19%pred. ~  1/11:  s/p LUL resection by DrBurney> multicentric bronchoalveolar cell cancer. ~  PFT 8/12 showed FVC=2.25 (88%), FEV1=1.08 (56%), %1sec=48, mid-flows=30% pred. ~  10/12:  She reports Dulera caused sore mouth so she switched back to Advair500 but asked to do this Bid regularly. ~  2013:  She won't stick w/ any therapy regularly & continues to decr her dosing on her own despite freq exacerbations... ~  3/14:  Now on Dulera100 per DrWert (despite prev hx of sore mouth) but only using it once/d, Spiriva, Mucinex, & Proventil HFA; asked (again) to maximize her regimen w/ Dulera100-2spBid, Mucinex2Bid, Fluids, etc; last PFT w/ FEV1=1.08 stable... ~  4/14:  Add-on appt c/o no better & sore mouth from the [facility]; treat w/ MMW & change to Symbicort160-2spBid via spacer; she requests MedrolDosepak- ok; we discussed referral to DrKozlow for his input> she has fixed obstruction, revers component, restrictive component (surg & obese), reflux component, & anxiety... ~  PFTs 4/14 showed>  pre-bronchodil>> FVC=1.83 (72%), FEV1=0.74 (39%), %1sec=41, mid-flows=16% predicted...  post-bronchdil>> FVC=1.68 (67%), FEV1=0.87 (46%), %1sec=51, mid-flows=19% predicted.  Hx of LUNG CANCER (ICD-162.9) - Hx multicentric bronchoalveolar cell lung cancer >> followed by DrBurney for CVTS & DrMohammed for Oncology... ~  s/p right upper lobectomy by DrBurney 11/05 for a stage 1A non-small cell lung cancer (adenocarcinoma w/ bronchalveolar cell features); post-op observation only... ~  CT  Chest 11/08 showed no recurrence, mult bilat  nodules unchanged x3+ yrs, nodular thyroid w/o change... ~  CXR 7/09 showed stable post-op changes and scarring on the right, NAD.[name]. ~  CTAngio Chest 10/09 showed neg for PE, prom thyroid, atherosclerotic changes in Ao, no change in ground-glass nodules in LUL area... ~  CT Chest 11/10 by DrMohammed showed new LUL solid nodule, no change in ground-glass areas... lesion was PET pos... ~  1/11:  s/p LULobectomy & node dissection via minithoracotomy by DrBurney- path showed 3 foci of well diff bronchoalveolar cell ca & neg nodes... decision made at conference for no chemoRx, EGFR assay was neg... ~  she continues to have monitoring by DrBurney/ DrMohammed> on observation alone now; note from DrMohammed 9/12 indicated she was stable & he plans f/u CT Chest & OV in 58mo... ~  CT Chest 9/12 per DrMohammed showed stable ?9mm ground glass opac LUL w/o change from prev; +post op changes, COPD, coronary calcif, & hep steatosis... ~  CT Chest 9/13 per DrMohammed showed 4mm ground glass nodule ant LUL, postop bilat scarring, normal heart size w/ calcif Ao & coronaries, no adenopathy... ~  CT Chest 05/04/12 showed s/p bilat upper lobe surg w/ post-op scarring, stable 4mm ground-glass nodule in ant left lung w/o change, atherosclerosis of Ao & coronaries, heterogeneous thyroid, no adenopathy...  HYPERTENSION (ICD-401.9) - on METOPROLOL25-1/2Bid, DIOVAN/Hct 160-12.5 Daily, VERAPAMIL SR 360mg /d, & LASIX 20mg  "Prn"...  ~  Cardiac eval 9/09 by Walker Kehr was neg and 2DEcho showed DD w/ norm LVsys function, EF= 60-65%, no regional wall motion abn... ~  3/13:  BP= 122/72 & feeling OK, tolerating Rx (DiovHct 160/12.5 & Verelan240)... denies CP, palipit, dizziness, syncope, ch in dyspnea, edema, etc...  ~  9/13:  BP= 114/62 & meds adjusted in the interval w/ DiovHct 160/12.5 & Verelan360 (plus Metop25-1/2Bid now)... ~  3/14:  BP= 140/60 & similar at home; she has mult somatic complaints as noted... ~  6/14:  BP= 122/70 &  similar at home; no change in her chronic symptoms...  PAROXYSMAL ATRIAL FIBRILLATION (ICD-427.31) - hx PAF in the post op period... converted to NSR & holding... transiently on Amiodarone in past & she wanted off Rx.  ~  Developed AFib w/ rvr 9/13 & eval by DrAllred> treated w/ Eliquis5Bid, Metoprolol25-1/2Bid, along w/ her Verelan & DiovanHCT... ~  2DEcho 9/13 showed modLVH, norm LVF w/ EF=55-60%, no regional wall motion abn, mildMR, mildLAdil, no ASD/PFO, PAsys=31 ~  Myoview 10/13 was NEG- no ischemia, normal wall motion, EF=74% ~  3/14:  She had decr the Metop & Eliquis to once daily on her own & was asked by Cards to take both Bid as directed...  PERIPHERAL VASCULAR DISEASE (ICD-443.9) - she has atherosclerotic changes in her Ao (& coronaries) noted on her prev scans...  ~  11/10: pt had mult questions about this problem on the prob list- discussed "hardening of the arteries" in detail.  VENOUS INSUFFICIENCY (ICD-459.81) - she knows to follow a low sodium diet, elevate legs, wear support hose, etc; she insists on keeping Lasix 20mg  on hand for Prn use...  HYPERLIPIDEMIA (ICD-272.4) - prev on Livolo 2mg - 1/2 tab daily (stopped due to aching), +FENOGLIDE 120mg /d, FISH OIL & CoQ10  supplements... prev on Vytorin but INTOL ==> she states INTOL to all statins... she was not satis w/ the Lipid Clinic in the past. ~  labs 8/08 off Vytorin showed TChol 183, TG 207, HDL 46, LDL 112... try fenofibrate... ~  FLP 5/09  on Feno120 showed TChol 188, TG 111, HDL 28, LDL 137... cont same, better diet, get wt down! ~  FLP 4/10 on Feno120 showed TChol 240, TG 104, HDL 49, LDL 165... I rec f/u Lipid Clinic, she declined. ~  FLP 7/10 on Feno120 showed TChol 222, TG 152, HDL 36, LDL 172... rec> try LIVALO 2mg - 1/2 tab Qhs, she stopped. ~  FLP 11/10 on Feno120+FishOil showed TChol 253, TG 125, HDL 42, LDL 208... try LIVALO2mg - 1/2 tab/d & stay on it. ~  FLP 1/11 on Liv1mg +Feno120 showed TChol 170, TG 101, HDL 45,  LDL 105... continue same. ~  3/11: she reports aching all over & DrBurney stopped the Livolo> contin diet + other meds. ~  FLP 8/11 showed TChol 225, TG 238, HDL 41, LDL 156... INTOL all statins, she'll do the best she can w/ diet. ~  FLP 8/12 in hosp showed TChol 234, TG 276, HDL 52, LDL 127... Counseled on low chol, low fat, wt reducing diet. ~  FLP 3/13 on Feno120 showed TChol 221, TG 85, HDL 56, LDL 159... She refuses Statin Rx or Lipid Clinic referral... ~  FLP 9/13 on diet alone showed TChol 183, TG 126, HDL 37, LDL 121 ~  FLP 3/14 on diet alone showed TChol 196, TG 84, HDL 46, LDL 133  DIABETES MELLITUS (ICD-250.00) - started on METFORMIN 500mg Bid 4/10, but decr on her own to 1 daily 1/11... we had stressed the importance of diet- low carb/ low fat and weight reduction, along w/ her pulm rehab exercises... ~  labs 4/10 showed BS= 131, HgA1c= 7.0.Marland KitchenMarland Kitchen Metformin500Bid started. ~  labs 7/10 showed BS= 134, A1c= 6.0 ~  labs 11/10 showed BS= 148, A1c= 6.5 ~  1/11:  she cut the Metformin to 1/d due to nausea. ~  labs 8/11 showed BS= 137, A1c= 5.7.Marland KitchenMarland Kitchen continue same, get wt down. ~  Labs 8/12 in hosp on Metform500/d showed BS= 110-230, A1c=6.5 ~  Labs 10/12 on Metform500/d showed BS= 141, A1c=7.0.Marland KitchenMarland Kitchen Needs better diet, get wt down, or more meds. ~  1/13:  Ophthalmology check up by DrMcCuen> neg- no diabetic retinopathy... ~  Labs 3/13 on Metform500/d showed BS= 124, A1c= 6.7 ~  Labs 3/14 on Metform500/d showed BS= not done, A1c= 6.9  NONTOXIC MULTINODULAR GOITER (ICD-241.1) - eval and rx by DrBalan for Endocrinology & DrGerkin for CCS; she is asymptomatic; dominant nodule was needled and benign; surg consult from DrGerkin- elected observation & he checks her yearly... ~  seen 6/10 by DrGerkin- f/u sonar w/o change in any of the nodules... ~  labs 7/10 showed TSH= 0.89 ~  seen 6/11 by DrGerkin- f/u sonar w/o change in nodules. ~  Labs 8/12 showed TSH= 0.047... Not on Thyroid meds, needs f/u DrBalan  (she never went). ~  Labs 10/12 showed TSH= 0.97, FreeT3= 3.2 (2.3-4.2), FreeT4= 0.88 (0.60-1.60) ~  Labs 7/13 showed TSH= 0.68 ~  Labs 3/14 showed TSH= 0.98  OBESITY (ICD-278.00) - obese w/ abd panniculus & we discussed diet + exercise strategies.[name]. ~  weight up to 205# 11/09- we discussed diet, calorie restriction, exercise, & get the weight down... ~  weight 4/10 = 198# ~  weight 7/10 = 194# ~  weight 11/10 = 196# ~  weight 2/11 = 184# (post op) ~  weight 8/11 = 181# ~  weight 11/11 = 186#... she needs to do better! ~  Weight 8/12 = 186# ~  Weight 10/12 = 198#... What happened? ~  Weight 3/13 = 189#.Marland KitchenMarland Kitchen  Keep up the good work! ~  Weight 9/13 = 184# ~  Weight 3/14 = 203# ~  Weight 6/14 = 203#  GERD (ICD-530.81) - on PRILOSEC 20mg /d... ~  EDG 6/13 by DrGessner showed mult gastric polyps (hyperplastic), mod gastritis, & treated w/ Omep20mg /d (she subseq stopped on her own)...  COLON POLYPS, DIVERTICULOSIS, HEMORRHOIDS >> ~  colonoscopy 7/09 by DrGessner showed 4 sm polyps= tubular adenoma on bx... f/u planned 3 yrs... ~  Colonoscopy 6/13 by DrGessner showed mult sm polyps, largest 73mm= tub adenoma, plus mild diverticvs, int hems, etc... ~  CT Abd 9/13 revealed GB to be decompressed, ?wall thickening & ?chr inflamm, extensive atherosclerosis in abd vasculature- consider CTA, sm umbil hernia, severe pelvic organ prolapse;  => HIDA scan 9/13 was neg, wnl...  She has recurrent nausea for which she has seen DrGesnner & DrGerkin...  NEPHROLITHIASIS, HX OF (ICD-V13.01)  Hx of BLADDER CANCER (ICD-188.9) - had hematuria in 2001 & referred to DrPeterson... 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 all subseq cystoscopies have been neg- no recurrence. ~  she reports f/u w/ DrPeterson yearly & doing satis by her report.  DEGENERATIVE JOINT DISEASE (ICD-715.90) - s/p rotator cuff repair in 1991... s/p left TKR from DrGioffre in  2002...  OSTEOPENIA (ICD-733.90) - off prev ACTONEL 150/mo for a drug holiday, ca++, MVI, etc... ~  I cannot find baseline BMD in EPIC or cone system> ?done by GYN? ~  labs 8/11 showed Vit D level = 22... rec> start Vit D supplement extra 02-1998 u daily... ~  2013:  Actonel removed from her list by DrGessner for drug holiday...  ANXIETY (ICD-300.00) - on XANAX 0.25mg  Prn... she has signif hx of claustrophobia, but denies that she has any anxiety...   Past Surgical History  Procedure Laterality Date  . S/p right rotator cuff repair  1991    Dr. Meade Maw, right  . S/p turbt  10/01    Dr. Vonita Moss  . S/p left tkr  2002    Dr. Darrelyn Hillock  . 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 S/p d&c for removal of endometrial polyp (benign)  12/10    GYN  . Colonoscopy    . Esophagogastroduodenoscopy      Outpatient Encounter Prescriptions as of 07/14/2012  Medication Sig Dispense Refill  . albuterol (PROVENTIL HFA;VENTOLIN HFA) 108 (90 BASE) MCG/ACT inhaler Inhale 2 puffs into the lungs every 6 (six) hours as needed for wheezing.      [name] ALPRAZolam (XANAX) 0.25 MG tablet Take 1/2 to 1 tablet BID PRN  60 tablet  5  . apixaban (ELIQUIS) 5 MG TABS tablet Take 5 mg by mouth daily.      . beclomethasone (QVAR) 80 MCG/ACT inhaler Inhale 1 puff into the lungs 2 (two) times daily. With the spacer      . Biotin 5000 MCG TABS Take 1 tablet by mouth daily.      . Coenzyme Q10 (COQ10) 200 MG CAPS Take 1 capsule by mouth daily.      . Cyanocobalamin (VITAMIN B 12 PO) Take by mouth. 1000 mcg once a day       . dexlansoprazole (DEXILANT) 60 MG capsule Take 60 mg by mouth daily.      [name] dextromethorphan-guaiFENesin (  MUCINEX DM) 30-600 MG per 12 hr tablet Take 1 tablet by mouth every 12 (twelve) hours.      . famotidine (PEPCID) 20 MG tablet Take 20 mg by  mouth daily.      . furosemide (LASIX) 20 MG tablet take 1 tablet by mouth once daily if needed for SWELLING  30 tablet  PRN  . ipratropium-albuterol (DUONEB) 0.5-2.5 (3) MG/3ML SOLN Take 3 mLs by nebulization daily.      . metFORMIN (GLUCOPHAGE) 500 MG tablet take 1 tablet by mouth every morning  30 tablet  6  . Multiple Vitamin (MULTIVITAMIN PO) Take by mouth. Once a day       . Probiotic Product (ALIGN PO) Take 1 tablet by mouth daily.      [name] Spacer/Aero-Holding Chambers (AEROCHAMBER PLUS) inhaler Use as instructed  1 each  2  . valsartan-hydrochlorothiazide (DIOVAN-HCT) 160-12.5 MG per tablet TAKE 1 TABLET BY MOUTH ONCE DAILY  30 tablet  11  . verapamil (VERELAN PM) 360 MG 24 hr capsule Take 1 capsule (360 mg total) by mouth at bedtime.  30 capsule  6  . Vitamin D, Ergocalciferol, (DRISDOL) 50000 UNITS CAPS Take 1 capsule (50,000 Units total) by mouth every 7 (seven) days.  4 capsule  12  . [DISCONTINUED] apixaban (ELIQUIS) 5 MG TABS tablet Take 1 tablet (5 mg total) by mouth 2 (two) times daily.  60 tablet  3  . [DISCONTINUED] budesonide-formoterol (SYMBICORT) 160-4.5 MCG/ACT inhaler Inhale 2 puffs into the lungs 2 (two) times daily.  1 Inhaler  6  . [DISCONTINUED] methocarbamol (ROBAXIN) 500 MG tablet take 1 tablet by mouth three times a day if needed  90 tablet  3  . [DISCONTINUED] methylPREDNISolone (MEDROL, PAK,) 4 MG tablet follow package directions  21 tablet  0  . [DISCONTINUED] metoprolol tartrate (LOPRESSOR) 25 MG tablet Take 12.5 mg by mouth daily.       . [DISCONTINUED] SPIRIVA HANDIHALER 18 MCG inhalation capsule INHALE THE CONTENTS OF 1 CAPSULE VIA HANDIHALER ONCE DAILY.  30 each  11   No facility-administered encounter medications on file as of 07/14/2012.    Allergies                                          Pt states INTOL to ALL STATINS...  Allergen Reactions  . Cephalexin     REACTION: nausea and diarrhea  . Codeine     REACTION: nausea  . Epinephrine     REACTION:  nervous  . Erythromycin     REACTION: all mycins cause yeast infections  . Morphine     REACTION: nausea  . Sulfamethoxazole W/Trimethoprim     REACTION: nausea and diarrhea    Current Medications, Allergies, Past Medical History, Past Surgical History, Family History, and Social History were reviewed in [employer] record.    Review of Systems         See HPI - all other systems neg except as noted...  The patient complains of hoarseness, dyspnea on exertion, headaches, muscle weakness, and difficulty walking.  The patient denies anorexia, fever, weight loss, weight gain, vision loss, decreased hearing, chest pain, syncope, peripheral edema, prolonged cough, hemoptysis, abdominal pain, melena, hematochezia, severe indigestion/heartburn, hematuria, incontinence, suspicious skin lesions, transient blindness, depression, unusual weight change, abnormal bleeding, enlarged lymph nodes, and angioedema.     Objective:   Physical Exam  WD, WN, 78 y/o  WF in NAD... GENERAL:  Alert & oriented; pleasant & cooperative... HEENT:  [place]/AT, EOM-wnl, PERRLA, EACs-clear, TMs-wnl, NOSE-clear, THROAT- no thrush, sm aphthous ulcer noted... NECK:  Supple w/ fairROM; no JVD; normal carotid impulses w/o bruits; no thyromegaly or nodules palpated; no lymphadenopathy. CHEST:  Decr BS bilat; scat wheezing/ rhonchi bilat, no rales or consolidation; s/p VATS surg scar on right & mini thoracotomy scar on left... sl tender to palp left chest wall... HEART:  Regular Rhythm; without murmurs/ rubs/ or gallops heard... ABDOMEN:  Obese w/ panniculus, soft & nontender; normal bowel sounds; no organomegaly or masses detected. EXT:  S/P left TKR; mild arthritic changes; no varicose veins/ +venous insuffic/ tr edema. NEURO:  CN's intact;  no focal neuro deficits... sl tender over right max sinus. DERM:  No lesions noted; no rash etc...  RADIOLOGY DATA:  Reviewed in the EPIC EMR & discussed w/ the  patient...  LABORATORY DATA:  Reviewed in the EPIC EMR & discussed w/ the patient...   Assessment & Plan:    COPD/ AB/ restriction from 2 surgeries>  She has severe airflow obstruction; Eval & Rx adjusted by DrKozlow & she is stable; requests Depo/ Dosepak for her trip...   Hx Lung Cancer w/ 2 prev surgeries>  Followed regularly by DrBurney, DrMohammed; last CT Chest 3/14 was stable...  HBP>  Controlled on Diovan/HCT & Verapamil, & Metoprolol added for rate control by cards...  AFIB>  PAF episode treated by DrAllred w/ Eliquis & Metoprolol (12.5Bid), back in NSR...  HYPERLIPID>  She is INTOL to all statins, on diet/ exercise/ FishOil; recent FLP remains fair & she will attempt to improve diet & lose weight...  DM>  Control is barely adeq w/ Metformin 500mg /d & A1c= 6.9; we discussed better low carb diet & exercise program...  Multinod Thyroid>  She never did call DrBalan for f/u; repeat labs show that sh is still biochem euthyroid & she has seen DrGerkin...  Obesity>  Diet + exercise are the keys here...  GERD>  Prev MBS by speech path w/o asp or penetration; she needs to eat more slowly, chew well, swallow carefully; rec to continue Prilosec, elev HOB, & NPO after dinner... Nausea & GB eval/ EGD/ etc per DrGessner & his notes reviewed...  Mult Colon Polyps>  See 6/13 colonoscopy per DrGessner...  DJD/ Osteopenia>  OK to restart the Actonel weekly...  Anxiety>  Prev on Xanax as needed, & doesn't think she needs a nerve pill; we discussed trial of KLONOPIN 0.5mg  bid to help her breathing...   Patient's Medications  New Prescriptions   METHYLPREDNISOLONE (MEDROL, PAK,) 4 MG TABLET    follow package directions  Previous Medications   ALBUTEROL (PROVENTIL HFA;VENTOLIN HFA) 108 (90 BASE) MCG/ACT INHALER    Inhale 2 puffs into the lungs every 6 (six) hours as needed for wheezing.   BECLOMETHASONE (QVAR) 80 MCG/ACT INHALER    Inhale 1 puff into the lungs 2 (two) times daily. With the  spacer   BIOTIN 5000 MCG TABS    Take 1 tablet by mouth daily.   COENZYME Q10 (COQ10) 200 MG CAPS    Take 1 capsule by mouth daily.   CYANOCOBALAMIN (VITAMIN B 12 PO)    Take by mouth. 1000 mcg once a day    DEXLANSOPRAZOLE (DEXILANT) 60 MG CAPSULE    Take 60 mg by mouth daily.   DEXTROMETHORPHAN-GUAIFENESIN (MUCINEX DM) 30-600 MG PER 12 HR TABLET    Take 1 tablet by  mouth every 12 (twelve) hours.   FAMOTIDINE (PEPCID) 20 MG TABLET    Take 20 mg by mouth daily.   FUROSEMIDE (LASIX) 20 MG TABLET    take 1 tablet by mouth once daily if needed for SWELLING   IPRATROPIUM-ALBUTEROL (DUONEB) 0.5-2.5 (3) MG/3ML SOLN    Take 3 mLs by nebulization daily.   METFORMIN (GLUCOPHAGE) 500 MG TABLET    take 1 tablet by mouth every morning   MULTIPLE VITAMIN (MULTIVITAMIN PO)    Take by mouth. Once a day    PROBIOTIC PRODUCT (ALIGN PO)    Take 1 tablet by mouth daily.   SPACER/AERO-HOLDING CHAMBERS (AEROCHAMBER PLUS) INHALER    Use as instructed   VALSARTAN-HYDROCHLOROTHIAZIDE (DIOVAN-HCT) 160-12.5 MG PER TABLET    TAKE 1 TABLET BY MOUTH ONCE DAILY   VERAPAMIL (VERELAN PM) 360 MG 24 HR CAPSULE    Take 1 capsule (360 mg total) by mouth at bedtime.   VITAMIN D, ERGOCALCIFEROL, (DRISDOL) 50000 UNITS CAPS    Take 1 capsule (50,000 Units total) by mouth every 7 (seven) days.  Modified Medications   Modified Medication Previous Medication   ALPRAZOLAM (XANAX) 0.25 MG TABLET ALPRAZolam (XANAX) 0.25 MG tablet      Take 1/2 to 1 tablet BID PRN    Take 1/2 to 1 tablet BID PRN   APIXABAN (ELIQUIS) 5 MG TABS TABLET apixaban (ELIQUIS) 5 MG TABS tablet      Take 5 mg by mouth daily.    Take 1 tablet (5 mg total) by mouth 2 (two) times daily.  Discontinued Medications   BUDESONIDE-FORMOTEROL (SYMBICORT) 160-4.5 MCG/ACT INHALER    Inhale 2 puffs into the lungs 2 (two) times daily.   METHOCARBAMOL (ROBAXIN) 500 MG TABLET    take 1 tablet by mouth three times a day if needed   METHYLPREDNISOLONE (MEDROL, PAK,) 4 MG TABLET     follow package directions   METOPROLOL TARTRATE (LOPRESSOR) 25 MG TABLET    Take 12.5 mg by mouth daily.    SPIRIVA HANDIHALER 18 MCG INHALATION CAPSULE    INHALE THE CONTENTS OF 1 CAPSULE VIA HANDIHALER ONCE DAILY.

## 2012-07-16 ENCOUNTER — Other Ambulatory Visit: Payer: Self-pay | Admitting: Pulmonary Disease

## 2012-07-17 ENCOUNTER — Telehealth: Payer: Self-pay | Admitting: Pulmonary Disease

## 2012-07-17 MED ORDER — ALPRAZOLAM 0.25 MG PO TABS: ORAL_TABLET | ORAL | Status: DC

## 2012-07-17 NOTE — Telephone Encounter (Signed)
I have called RX in for pt.  I called pt and made her aware

## 2012-07-30 ENCOUNTER — Encounter: Payer: Self-pay | Admitting: Internal Medicine

## 2012-08-17 ENCOUNTER — Ambulatory Visit (INDEPENDENT_AMBULATORY_CARE_PROVIDER_SITE_OTHER): Payer: Medicare Other | Admitting: Internal Medicine

## 2012-08-17 ENCOUNTER — Encounter: Payer: Self-pay | Admitting: Internal Medicine

## 2012-08-17 VITALS — BP 130/72 | HR 78 | Ht 61.0 in | Wt 200.8 lb

## 2012-08-17 DIAGNOSIS — I4891 Unspecified atrial fibrillation: Secondary | ICD-10-CM

## 2012-08-17 DIAGNOSIS — I10 Essential (primary) hypertension: Secondary | ICD-10-CM

## 2012-08-17 DIAGNOSIS — R0602 Shortness of breath: Secondary | ICD-10-CM

## 2012-08-17 DIAGNOSIS — E669 Obesity, unspecified: Secondary | ICD-10-CM

## 2012-08-17 NOTE — Progress Notes (Signed)
PCP:  Michele Mcalpine, MD  The patient presents today for routine cardiology followup.  Since last being seen by Norma Fredrickson, the patient reports doing reasonably well. She has stable SOB/ dyspnea with moderate activity. She reports only moderate compliance with eliquis. Today, she denies symptoms of palpitations, chest pain,orthopnea, PND, lower extremity edema, dizziness, presyncope, syncope, or neurologic sequela.  The patient feels that she is tolerating medications without difficulties and is otherwise without complaint today.    Past Medical History  Diagnosis Date  . Asthma   . COPD (chronic obstructive pulmonary disease)   . History of lung cancer   . Hypertension   . Paroxysmal atrial fibrillation   . Peripheral vascular disease   . Venous insufficiency   . Hyperlipidemia   . Diabetes mellitus   . Nontoxic multinodular goiter   . Obesity   . GERD (gastroesophageal reflux disease)   . History of nephrolithiasis   . History of bladder cancer   . DJD (degenerative joint disease)   . Osteopenia   . Anxiety   . History of colon polyps 07/233/2009    Tubular Adenomas  . Diverticulosis of sigmoid colon     mild  . Gastritis     moderate  . Hemorrhoids, internal   . Claustrophobia   . Thyroid nodule   . Chronic anticoagulation     Eliquis   Past Surgical History  Procedure Laterality Date  . S/p right rotator cuff repair  1991    Dr. Meade Maw, right  . S/p turbt  10/01    Dr. Vonita Moss  . S/p left tkr  2002    Dr. Darrelyn Hillock  . 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 S/p d&c for removal of endometrial polyp (benign)  12/10    GYN  . Colonoscopy    . Esophagogastroduodenoscopy      Current Outpatient Prescriptions  Medication Sig Dispense Refill  . albuterol (PROVENTIL HFA;VENTOLIN HFA) 108 (90  BASE) MCG/ACT inhaler Inhale 2 puffs into the lungs every 6 (six) hours as needed for wheezing.      [name] ALPRAZolam (XANAX) 0.25 MG tablet Take 0.25 mg by mouth daily. Take 1/2 to 1 tablet qd PRN      . apixaban (ELIQUIS) 5 MG TABS tablet Take 5 mg by mouth daily.      . beclomethasone (QVAR) 80 MCG/ACT inhaler Inhale 1 puff into the lungs 2 (two) times daily. With the spacer      . Biotin 5000 MCG TABS Take 1 tablet by mouth daily.      . Coenzyme Q10 (COQ10) 200 MG CAPS Take 1 capsule by mouth daily.      . Cyanocobalamin (VITAMIN B 12 PO) Take by mouth. 1000 mcg once a day       . dexlansoprazole (DEXILANT) 60 MG capsule Take 60 mg by mouth daily.      [name] dextromethorphan-guaiFENesin (MUCINEX DM) 30-600 MG per 12 hr tablet Take 1 tablet by mouth every 12 (twelve) hours.      . famotidine (PEPCID) 20 MG tablet Take 20 mg by mouth daily.      . furosemide (LASIX) 20 MG tablet take 1 tablet by mouth once daily if needed for SWELLING  30 tablet  PRN  . ipratropium-albuterol (DUONEB) 0.5-2.5 (3) MG/3ML SOLN  Take 3 mLs by nebulization daily.      . metFORMIN (GLUCOPHAGE) 500 MG tablet take 1 tablet by mouth every morning  30 tablet  6  . Multiple Vitamin (MULTIVITAMIN PO) Take by mouth. Once a day       . Probiotic Product (ALIGN PO) Take 1 tablet by mouth daily.      [name] Spacer/Aero-Holding Chambers (AEROCHAMBER PLUS) inhaler Use as instructed  1 each  2  . valsartan-hydrochlorothiazide (DIOVAN-HCT) 160-12.5 MG per tablet TAKE 1 TABLET BY MOUTH ONCE DAILY  30 tablet  11  . verapamil (VERELAN PM) 360 MG 24 hr capsule Take 1 capsule (360 mg total) by mouth at bedtime.  30 capsule  6  . Vitamin D, Ergocalciferol, (DRISDOL) 50000 UNITS CAPS Take 1 capsule (50,000 Units total) by mouth every 7 (seven) days.  4 capsule  12   No current facility-administered medications for this visit.    Allergies  Allergen Reactions  . Cephalexin     REACTION: nausea and diarrhea  . Codeine     REACTION: nausea  .  Epinephrine     REACTION: nervous, convulsions  . Erythromycin     REACTION: all mycins cause yeast infections  . Morphine     REACTION: nausea  . Sulfamethoxazole W-Trimethoprim     REACTION: nausea and diarrhea    History   Social History  . Marital Status: Married    Spouse Name: N/A    Number of Children: 2  . Years of Education: N/A   Occupational History  . Retired     Social History Main Topics  . Smoking status: Former Smoker -- 0.50 packs/day for 39 years    Types: Cigarettes    Quit date: 02/04/1994  . Smokeless tobacco: Never Used  . Alcohol Use: Yes     Comment: Occasionally  . Drug Use: No  . Sexually Active: Not Currently   Other Topics Concern  . Not on file   Social History Narrative   Daily caffeine     Family History  Problem Relation Age of Onset  . Asthma Mother   . Heart failure Mother   . Heart attack Father   . Ulcers Father   . Colon cancer Neg Hx     ROS-  All systems are reviewed and are negative except as outlined in the HPI above   Physical Exam: Filed Vitals:   08/17/12 1120  BP: 130/72  Pulse: 78  Height: 5\' 1"  (1.549 m)  Weight: 200 lb 12.8 oz (91.082 kg)    GEN- The patient is elderly and obese  appearing, alert and oriented x 3 today.   Head- normocephalic, atraumatic Eyes-  Sclera clear, conjunctiva pink Ears- hearing intact Oropharynx- clear Neck- supple, no JVP Lymph- no cervical lymphadenopathy Lungs- few expiratory wheezes Heart- Regular rate and rhythm, no murmurs, rubs or gallops, PMI not laterally displaced GI- soft, NT, ND, + BS Extremities- no clubbing, cyanosis, or edema  ekg today reveals sinus rhythm 63 bpm, LVH, otherwise normal ekg 10/15/11 echo results reviewed   Assessment and Plan:  1. afib Stable, she says that she has had no afib since her last visit with me Importance of compliance with eliquis is encouraged  2. SOB Followed closely by Dr Kriste Basque, likely pulmonary in  etiology Previously echo and stress test were unrevealing.  3. HTN Stable No change required today  4. Obesity/ deconditioning Weight loss and regular activity was encouraged today

## 2012-08-17 NOTE — Patient Instructions (Addendum)
Your physician wants you to follow-up in: 6 months with Norma Fredrickson, NP and 12 months with Dr Jacquiline Doe will receive a reminder letter in the mail two months in advance. If you don't receive a letter, please call our office to schedule the follow-up appointment.  Make sure you take your Eliquis twice daily

## 2012-08-22 ENCOUNTER — Other Ambulatory Visit: Payer: Self-pay | Admitting: Pulmonary Disease

## 2012-09-07 ENCOUNTER — Other Ambulatory Visit (INDEPENDENT_AMBULATORY_CARE_PROVIDER_SITE_OTHER): Payer: Medicare Other

## 2012-09-07 ENCOUNTER — Encounter: Payer: Self-pay | Admitting: Adult Health

## 2012-09-07 ENCOUNTER — Ambulatory Visit (INDEPENDENT_AMBULATORY_CARE_PROVIDER_SITE_OTHER): Payer: Medicare Other | Admitting: Adult Health

## 2012-09-07 VITALS — BP 124/68 | HR 88 | Temp 99.1°F | Ht 61.0 in | Wt 202.2 lb

## 2012-09-07 DIAGNOSIS — I872 Venous insufficiency (chronic) (peripheral): Secondary | ICD-10-CM

## 2012-09-07 DIAGNOSIS — J449 Chronic obstructive pulmonary disease, unspecified: Secondary | ICD-10-CM

## 2012-09-07 DIAGNOSIS — J4489 Other specified chronic obstructive pulmonary disease: Secondary | ICD-10-CM

## 2012-09-07 DIAGNOSIS — J45909 Unspecified asthma, uncomplicated: Secondary | ICD-10-CM

## 2012-09-07 DIAGNOSIS — R0602 Shortness of breath: Secondary | ICD-10-CM

## 2012-09-07 MED ORDER — AZITHROMYCIN 250 MG PO TABS: ORAL_TABLET | ORAL | Status: AC

## 2012-09-07 NOTE — Assessment & Plan Note (Signed)
Bilateral venous insufficiency with edema. We'll check labs with BNP.  Plan  Keep legs elevated , low salt diet  Take Lasix 20mg  2 tabs daily for next 2 days then daily As needed  For leg swelling.  Venous Insufficiency with edema.  I will call with lab results.  Please contact office for sooner follow up if symptoms do not improve or worsen or seek emergency care  follow up Dr. Kriste Basque  As planned and As needed

## 2012-09-07 NOTE — Patient Instructions (Addendum)
Keep legs elevated , low salt diet  Take Lasix 20mg  2 tabs daily for next 2 days then daily As needed  For leg swelling.  Venous Insufficiency with edema.  Zpack to have on hold if symptoms worsen with discolored mucus . (If you take -eat yogurt) .  I will call with lab results.  Please contact office for sooner follow up if symptoms do not improve or worsen or seek emergency care  follow up Dr. Kriste Basque  As planned and As needed

## 2012-09-07 NOTE — Assessment & Plan Note (Signed)
Mild flare with URI   Plan  Zpack to have on hold if symptoms worsen with discolored mucus . (If you take -eat yogurt) .    Please contact office for sooner follow up if symptoms do not improve or worsen or seek emergency care  follow up Dr. Kriste Basque  As planned and As needed

## 2012-09-07 NOTE — Progress Notes (Signed)
Subjective:    Patient ID: [name], female    DOB: May 01, 1934, 78 y.o.   MRN: [id number]  HPI 78 y/o WF   ~  December 05, 2010:  84mo ROV & she notes incr SOB w/ activ, & states she never got back to her prev baseline after last hosp & upset because she missed 2 trips; she notes that she hurt her back & hasn't been exercising because of this- since then incr SOB w/ some ADLs and incr edema in her legs;  She notes that she "can't get a deep breath" & she appears more anxious than usual w/ mult somatic complaints- indigestion, cramps in hands & feet, etc...  We discussed taking the Alprazolam 0.25mg  Tid regularly for her chest wall musc spasm so she can get a good deep breath & see how this works (she is asking about Accolate because a friend is on it & it really helped them)...    COPD w/ revers component> on Advair500 but only using it once/d (she stopped [facility] stating it made her mouth sore), Spiriva, Mucinex, & Proventil HFA; asked (again) to maximize her regimen w/ AdvairBid, Mucinex2Bid, Fluids, etc; last PFT w/ FEV1=1.08 stable; see above...    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 NOTE: element of lung restriction from surg & obesity...    HBP> on DiovanHCT, Verapamil, & Lasix prn; BP= 144/80 & similar at home; she has mult somatic complaints...    Ven Insuffic> she knows to elim sodium but she eats out often, elev legs, wear support hose; she has been taking the Lasix20mg  daily due to the swelling & improved...     Hyperlipid> on Fenoglide120 & FishOil; she states INTOL to all statins; last FLP 8/12 in hosp as below; asked to ret for fasting blood work...    DM> on Metform500/d; BS=141, A1c=7.0; she will need more meds if she can't get on diet 7 get weight down...    Obesity>  wt=198# which is up 12# in the last 84mo!!! We reviewed low carb, low fat, wt reducing diet 7 exercise prescription...    Multinod Goiter> prev eval from [place] DrGerkin; TFTs showed TSH=0.97, and FreeT3/ FreeT4 are wnl...    GI> GERD, Polyps> on Prilosec & had episode of choking on dinner w/ resp exac- see speech path eval; followed by DrGessner for GI w/ 7yr f/u colonoscopy due soon...    GU> Bladder Cancer> Papilllary TCCa resected 2001 & no recurrence; followed by DrPeterson w/ yearly cystos neg...    DJD/ LBP> on musc relaxer & Advil; prev rotator cuff surg 1991, left TKR 2002, now followed by DrRamos for LBP & plans shot in back (she reports it's improved spontaneously)...    Osteopenia> on Actonel, calcium, MVI, Vit D; ?when her last BMD was done?    Anxiety> as noted- she has Alpraz for prn use but doesn't take it often...  ~  April 30, 2011:  39mo ROV & [name] has been stable w/ her severe lung disease as noted; unfortunately she still self-adjusts her meds despite my admonition to take meds regularly as prescribed for max benefit; she tells me she has "6 trips" coming up & she wants her "magic shot" meaning DepoMedrol; we have on numerous occas discussed the importance of minimizing the cortisone Rx & maximizing her  bronchdilators etc; plus we have implored her to decrease her aggressive travel schedule fearing that she will have a resp exacerbation (w/ acute resp failure) [place]... See prob list below>> LABS 3/13:  FLP- not at goal on Feno120;  Chems- ok x BS=124 A1c=6.7 on Metform monotherapy...  ~  October 31, 2011:  38mo ROV & [name] states she decided to "get healthy" & hired a Systems analyst but during her 1st session she developed an irreg tachyarrhythmia & was Dx w/ AFib & rvr> seen by [employer] & DrAllred & treated w/ Eliquis5Bid, Metoprolol25-1/2Bid, along w/ her Verelan & DiovanHCT;  2DEcho showed modLVH, norm LVF w/ EF=55-60%, no regional wall motion abn, mildMR,  mildLAdil, no ASD/PFO, PAsys=31;  Now improved & feeling better...    She was also being evaluated by DrGessner for nausea x50mo> she states nothing helps- on Zofran, Align, etc;  He did EGD/ Colon 6/13 w/ gastric polyps (hyperplastic), colon polyps (largest 53mm= tub adenoma), mild divertics, int hems;  He did AbdSonar- normal GB, no dilatation, +hepatic steatosis; Then CT Abd showed a decompressed GB, some wall thickening ?inflamm, and other findings like extensive atherosclerotic dis in abd, sm umbil hernia, & severe pelvic organ prolapse; finally HIDA scan normal; she tells me she has appt w/ DrGerkin to consider GB surg...    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 2) History of stage IA (T1, N0, MX) non-small cell lung cancer, adenocarcinoma with bronchoalveolar features diagnosed in November 2005 involving the right upper lobe. Prior Therapy: 1) Status post right upper lobectomy on January 03, 2004. 2) Status post wedge resection of the left upper lobe on February 06, 2009 under the care of Dr. Edwyna Shell. Current Therapy:  Observation    We reviewed prob list, meds, xrays and labs> see below for updates >> she will get the 2013 flu shot at [facility]...  ~  April 30, 2012:  38mo ROV & [name] has had a lot going on> saw TP 1/14 w/ resp exac c/o incr SOB, cough, wheezing, tightness off & on for 14mo; took Avelox=> Augmentin, Depo, Pred, Mucinex, Pepcid, Hydromet, etc;  Then she had OV 3/14 w/ DrWert w/ barking cough, hoarseness, etc> he stopped her Advair & changed to Dulera100, gave her Depo120 & stressed Pepcid & antireflux regimen...  She ret for 1wk f/u after changes by DrWert but she is no better, upset that the depo didn't help this time but true-to-form she has adjusted her own  meds & we discussed regimen & need for regular dosing of all meds- Dulera100-2spBid, Spiriva daily, Mucinex-2Bid, fluids, along w/ her antireflux regimen, elev HOB, Pepcid Bid, etc...    COPD w/ revers component> on Dulera100 now but only using it once/d, Spiriva, Mucinex, & Proventil HFA; asked (again) to maximize her regimen w/ Dulera100-2spBid, Mucinex2Bid, Fluids, etc; last PFT w/ FEV1=1.08 stable; see below...    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 NOTE: element of lung restriction from surg & obesity...    HBP> on Metoprolol25-1/2Bid, DiovanHCT160-12.5, Verapamil360, & Lasix20 prn; BP= 140/60 & similar at home; she has  mult somatic complaints...    PAFib> She had recurrent PAF 9/13 while exercising; seen by [facility] & treated w/ Eliquis5Bid, Metoprolol added, along w/ her Verelan & DiovanHCT; maintaining NSR & denies CP, palpit, dizzy, etc; tolerating Rx well- no changes made; he did Myoview 10/13 which was neg- no ischemia, normal wall motion, EF=74%...     Ven Insuffic> she knows to elim sodium but she eats out often, elev legs, wear support hose; she has been taking the Lasix20mg  daily due to the swelling & improved...     Hyperlipid> off prev Fenoglide & on FishOil, CoQ10, diet; she states INTOL to all statins; FLP 3/14 shows TChol 196, TG 84, HDL 46, LDL 133    DM> on [id number]; labs 3/14 shows BS= not done, A1c=6.9 (stable); she is advised to continue same, get on diet & get weight down...    Obesity> wt=203# which is up 5# further; We reviewed low carb, low fat, wt reducing diet & exercise prescription...    Multinod Goiter> prev eval from [place] DrGerkin; TFTs showed TSH=0.98, and prev FreeT3/ FreeT4 were wnl...    GI> GERD, Polyps> on Prilosec/Pepcid but won't take them  regularly & has hx episode of choking on dinner w/ resp exac- see prev speech path eval; followed by DrGessner for GI w/ EGD/Colon 6/13 showing mult hyperplastic polyps in stomach, & Colon w/ divertics, AVM, int hems, & numerous sm polyps (max26mm)- tub adenomas w/ repeat planned 15yr...    GU> Bladder Cancer> Papilllary TCCa resected 2001 & no recurrence; followed by DrPeterson w/ yearly cystos neg...    DJD/ LBP> on musc relaxer & Advil; prev rotator cuff surg 1991, left TKR 2002, now followed by DrRamos for LBP & plans shot in back (she reports it's improved spontaneously)...    Osteopenia> prev on Actonel (she stopped on her own), calcium, MVI, Vit D; ?when her last BMD was done?    Anxiety> as noted- she has Alpraz0.25mg  for prn use but doesn't take it often despite being asked to take it Tid regularly... We reviewed prob list, meds, xrays and labs> see below for updates >> prolonged OV requiring >39min review of data & discussion w/ pt... LABS 3/14:  FLP- improved but LDL=133 on diet alone, refuses meds;  Chems- not done as requested just LFTs=wnl;  TSH=0.98;  A1c=6.9   ~  May 07, 2012:  1wk ROV & add-on at pt request ("DrMohammed told me to come") stating no better w/ cough, congestion, SOB, and specifically notes sore mouth & tongue from the [facility] (she c/o this same complic in past when tried in 2012);  We reviewed her fixed obstructive disease & reactive airways dis component- and the need for regular dosing of all meds which address the mult components of her lung problem (ie- COPD, revers component, reflux, restriction, anxiety) but she is only concerned w/ not missing a trip to the beach this weekend...  We decided to check PFT, give her a NEB treatment, and recheck PFT (not signif reversibility found)...  We will stop the [facility], change to Symbicort160-2spBid via spacer & treat sore mouth w/ MMW; she wants Medrol dosepak to take to the beach...  Finally I have recommended allergy/immunology eval  from DrKozlow for his input ...    PFT pre-bronchodil>> FVC=1.83 (72%), FEV1=0.74 (39%), %1sec=41, mid-flows=16% predicted...    Given NEB Rx w/ Albuterol...    Repeat PFT post-bronchdil>> FVC=1.68 (67%), FEV1=0.87 (46%), %1sec=51, mid-flows=19% predicted...    O2 sat= 93% on RA at  rest, and only dropped to 92% on RA after 1lap in office w/ HR=100...  She had 454mo f/u appt w/ DrMohammed 05/06/12 for f/u of her lung cancers> CT Chest 05/04/12 showed s/p bilat upper lobe surg w/ post-op scarring, stable 4mm ground-glass nodule in ant left lung w/o change, atherosclerosis of Ao & coronaries, heterogeneous thyroid, no adenopathy... They plan another f/u in 454mo. 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 2) History of stage IA (T1, N0, MX) non-small cell lung cancer, adenocarcinoma with bronchoalveolar features diagnosed in November 2005 involving the right upper lobe. Prior Therapy: 1) Status post right upper lobectomy on January 03, 2004. 2) Status post wedge resection of the left upper lobe on February 06, 2009 under the care of Dr. Edwyna Shell. Current Therapy:  Observation  ~  July 14, 2012:  54mo ROV & [name] reports that she is better- best she's been in several months, on NEBS per DrKozlow ("I really like him"), +QVar via Aerochamber, had ONO & started on Oxygen 1.5L Qhs (she checks her own oximetry now); despite her improvement- she still wants a Depo shot prior to her upcoming trip "very active & I don't want to miss a thing";  DrKozlow sent her to [facility] for ENT eval & this was neg per pt...  We reviewed prob list, meds, xrays and labs> see below for updates >>   09/07/2012 Acute OV  Complains of pt reports increased SOB, wheezing x 2 weeks . Has had, dry cough over the last couple days. Patient denies a, discolored mucus, fever, chest pain, orthopnea. Has noticed the ankles,are  more puffy over the last several days. These are worse in the  afternoon hours after she is standing .  Patient uses Lasix 20 mg daily. She denies any new medications, chest pain, palpitations, fever, redness, or rash. No recent travel.she denies any calf pain CXR 06/01/12 with no acute changes .           Problem List:      ASTHMA (ICD-493.90) / COPD (ICD-496) - ex-smoker, quit 1997... Back on ADVAIR500 & SPIRIVA 1/d + MUCINEX 2Bid & PROVENTIL Prn... she was participating in [facility] (last 3/09) & stopped on her own... may have a superimposed component of restriction due to obesity & prev lung surgeries... ~  11/05: s/p RULobectomy for lung ca- (adeno w/ bronchoalveolar cell features). ~  PFT 8/08 showed FVC=2.02 (77%), FEV1=1.07 (51%), %1sec=53, mid-flows=19%pred. ~  PFT 7/09 today= FVC=1.93 (72%), FEV1=1.04 (49%), %1sec=54, mid-flows=19%pred. ~  1/11:  s/p LUL resection by DrBurney> multicentric bronchoalveolar cell cancer. ~  PFT 8/12 showed FVC=2.25 (88%), FEV1=1.08 (56%), %1sec=48, mid-flows=30% pred. ~  10/12:  She reports Dulera caused sore mouth so she switched back to Advair500 but asked to do this Bid regularly. ~  2013:  She won't stick w/ any therapy regularly & continues to decr her dosing on her own despite freq exacerbations... ~  3/14:  Now on Dulera100 per DrWert (despite prev hx of sore mouth) but only using it once/d, Spiriva, Mucinex, & Proventil HFA; asked (again) to maximize her regimen w/ Dulera100-2spBid, Mucinex2Bid, Fluids, etc; last PFT w/ FEV1=1.08 stable... ~  4/14:  Add-on appt c/o no better & sore mouth from the [facility]; treat w/ MMW & change to Symbicort160-2spBid via spacer; she requests MedrolDosepak- ok; we discussed referral to DrKozlow for his input> she has fixed obstruction, revers component, restrictive component (surg & obese), reflux component, & anxiety... ~  PFTs 4/14 showed>  pre-bronchodil>> FVC=1.83 (72%), FEV1=0.74 (39%), %1sec=41, mid-flows=16% predicted...  post-bronchdil>> FVC=1.68 (67%), FEV1=0.87  (46%), %1sec=51, mid-flows=19% predicted.  Hx of LUNG CANCER (ICD-162.9) - Hx multicentric bronchoalveolar cell lung cancer >> followed by DrBurney for CVTS & DrMohammed for Oncology... ~  s/p right upper lobectomy by DrBurney 11/05 for a stage 1A non-small cell lung cancer (adenocarcinoma w/ bronchalveolar cell features); post-op observation only... ~  CT Chest 11/08 showed no recurrence, mult bilat nodules unchanged x3+ yrs, nodular thyroid w/o change... ~  CXR 7/09 showed stable post-op changes and scarring on the right, NAD.[name]. ~  CTAngio Chest 10/09 showed neg for PE, prom thyroid, atherosclerotic changes in Ao, no change in ground-glass nodules in LUL area... ~  CT Chest 11/10 by DrMohammed showed new LUL solid nodule, no change in ground-glass areas... lesion was PET pos... ~  1/11:  s/p LULobectomy & node dissection via minithoracotomy by DrBurney- path showed 3 foci of well diff bronchoalveolar cell ca & neg nodes... decision made at conference for no chemoRx, EGFR assay was neg... ~  she continues to have monitoring by DrBurney/ DrMohammed> on observation alone now; note from DrMohammed 9/12 indicated she was stable & he plans f/u CT Chest & OV in 1mo... ~  CT Chest 9/12 per DrMohammed showed stable ?9mm ground glass opac LUL w/o change from prev; +post op changes, COPD, coronary calcif, & hep steatosis... ~  CT Chest 9/13 per DrMohammed showed 4mm ground glass nodule ant LUL, postop bilat scarring, normal heart size w/ calcif Ao & coronaries, no adenopathy... ~  CT Chest 05/04/12 showed s/p bilat upper lobe surg w/ post-op scarring, stable 4mm ground-glass nodule in ant left lung w/o change, atherosclerosis of Ao & coronaries, heterogeneous thyroid, no adenopathy...  HYPERTENSION (ICD-401.9) - on METOPROLOL25-1/2Bid, DIOVAN/Hct 160-12.5 Daily, VERAPAMIL SR 360mg /d, & LASIX 20mg  "Prn"...  ~  Cardiac eval 9/09 by Walker Kehr was neg and 2DEcho showed DD w/ norm LVsys function, EF= 60-65%, no  regional wall motion abn... ~  3/13:  BP= 122/72 & feeling OK, tolerating Rx (DiovHct 160/12.5 & Verelan240)... denies CP, palipit, dizziness, syncope, ch in dyspnea, edema, etc...  ~  9/13:  BP= 114/62 & meds adjusted in the interval w/ DiovHct 160/12.5 & Verelan360 (plus Metop25-1/2Bid now)... ~  3/14:  BP= 140/60 & similar at home; she has mult somatic complaints as noted... ~  6/14:  BP= 122/70 & similar at home; no change in her chronic symptoms...  PAROXYSMAL ATRIAL FIBRILLATION (ICD-427.31) - hx PAF in the post op period... converted to NSR & holding... transiently on Amiodarone in past & she wanted off Rx.  ~  Developed AFib w/ rvr 9/13 & eval by DrAllred> treated w/ Eliquis5Bid, Metoprolol25-1/2Bid, along w/ her Verelan & DiovanHCT... ~  2DEcho 9/13 showed modLVH, norm LVF w/ EF=55-60%, no regional wall motion abn, mildMR, mildLAdil, no ASD/PFO, PAsys=31 ~  Myoview 10/13 was NEG- no ischemia, normal wall motion, EF=74% ~  3/14:  She had decr the Metop & Eliquis to once daily on her own & was asked by Cards to take both Bid as directed...  PERIPHERAL VASCULAR DISEASE (ICD-443.9) - she has atherosclerotic changes in her Ao (& coronaries) noted on her prev scans...  ~  11/10: pt had mult questions about this problem on the prob list- discussed "hardening of the arteries" in detail.  VENOUS INSUFFICIENCY (ICD-459.81) - she knows to follow a low sodium diet, elevate legs, wear support hose, etc; she insists on keeping Lasix 20mg   on hand for Prn use...  HYPERLIPIDEMIA (ICD-272.4) - prev on Livolo 2mg - 1/2 tab daily (stopped due to aching), +FENOGLIDE 120mg /d, FISH OIL & CoQ10  supplements... prev on Vytorin but INTOL ==> she states INTOL to all statins... she was not satis w/ the Lipid Clinic in the past. ~  labs 8/08 off Vytorin showed TChol 183, TG 207, HDL 46, LDL 112... try fenofibrate... ~  FLP 5/09 on Feno120 showed TChol 188, TG 111, HDL 28, LDL 137... cont same, better diet, get wt  down! ~  FLP 4/10 on Feno120 showed TChol 240, TG 104, HDL 49, LDL 165... I rec f/u Lipid Clinic, she declined. ~  FLP 7/10 on Feno120 showed TChol 222, TG 152, HDL 36, LDL 172... rec> try LIVALO 2mg - 1/2 tab Qhs, she stopped. ~  FLP 11/10 on Feno120+FishOil showed TChol 253, TG 125, HDL 42, LDL 208... try LIVALO2mg - 1/2 tab/d & stay on it. ~  FLP 1/11 on Liv1mg +Feno120 showed TChol 170, TG 101, HDL 45, LDL 105... continue same. ~  3/11: she reports aching all over & DrBurney stopped the Livolo> contin diet + other meds. ~  FLP 8/11 showed TChol 225, TG 238, HDL 41, LDL 156... INTOL all statins, she'll do the best she can w/ diet. ~  FLP 8/12 in hosp showed TChol 234, TG 276, HDL 52, LDL 127... Counseled on low chol, low fat, wt reducing diet. ~  FLP 3/13 on Feno120 showed TChol 221, TG 85, HDL 56, LDL 159... She refuses Statin Rx or Lipid Clinic referral... ~  FLP 9/13 on diet alone showed TChol 183, TG 126, HDL 37, LDL 121 ~  FLP 3/14 on diet alone showed TChol 196, TG 84, HDL 46, LDL 133  DIABETES MELLITUS (ICD-250.00) - started on METFORMIN 500mg Bid 4/10, but decr on her own to 1 daily 1/11... we had stressed the importance of diet- low carb/ low fat and weight reduction, along w/ her pulm rehab exercises... ~  labs 4/10 showed BS= 131, HgA1c= 7.0.Marland KitchenMarland Kitchen Metformin500Bid started. ~  labs 7/10 showed BS= 134, A1c= 6.0 ~  labs 11/10 showed BS= 148, A1c= 6.5 ~  1/11:  she cut the Metformin to 1/d due to nausea. ~  labs 8/11 showed BS= 137, A1c= 5.7.Marland KitchenMarland Kitchen continue same, get wt down. ~  Labs 8/12 in hosp on Metform500/d showed BS= 110-230, A1c=6.5 ~  Labs 10/12 on Metform500/d showed BS= 141, A1c=7.0.Marland KitchenMarland Kitchen Needs better diet, get wt down, or more meds. ~  1/13:  Ophthalmology check up by DrMcCuen> neg- no diabetic retinopathy... ~  Labs 3/13 on Metform500/d showed BS= 124, A1c= 6.7 ~  Labs 3/14 on Metform500/d showed BS= not done, A1c= 6.9  NONTOXIC MULTINODULAR GOITER (ICD-241.1) - eval and rx by DrBalan  for Endocrinology & DrGerkin for CCS; she is asymptomatic; dominant nodule was needled and benign; surg consult from DrGerkin- elected observation & he checks her yearly... ~  seen 6/10 by DrGerkin- f/u sonar w/o change in any of the nodules... ~  labs 7/10 showed TSH= 0.89 ~  seen 6/11 by DrGerkin- f/u sonar w/o change in nodules. ~  Labs 8/12 showed TSH= 0.047... Not on Thyroid meds, needs f/u DrBalan (she never went). ~  Labs 10/12 showed TSH= 0.97, FreeT3= 3.2 (2.3-4.2), FreeT4= 0.88 (0.60-1.60) ~  Labs 7/13 showed TSH= 0.68 ~  Labs 3/14 showed TSH= 0.98  OBESITY (ICD-278.00) - obese w/ abd panniculus & we discussed diet + exercise strategies.[name]. ~  weight up to 205# 11/09- we discussed  diet, calorie restriction, exercise, & get the weight down... ~  weight 4/10 = 198# ~  weight 7/10 = 194# ~  weight 11/10 = 196# ~  weight 2/11 = 184# (post op) ~  weight 8/11 = 181# ~  weight 11/11 = 186#... she needs to do better! ~  Weight 8/12 = 186# ~  Weight 10/12 = 198#... What happened? ~  Weight 3/13 = 189#... Keep up the good work! ~  Weight 9/13 = 184# ~  Weight 3/14 = 203# ~  Weight 6/14 = 203#  GERD (ICD-530.81) - on PRILOSEC 20mg /d... ~  EDG 6/13 by DrGessner showed mult gastric polyps (hyperplastic), mod gastritis, & treated w/ Omep20mg /d (she subseq stopped on her own)...  COLON POLYPS, DIVERTICULOSIS, HEMORRHOIDS >> ~  colonoscopy 7/09 by DrGessner showed 4 sm polyps= tubular adenoma on bx... f/u planned 3 yrs... ~  Colonoscopy 6/13 by DrGessner showed mult sm polyps, largest 15mm= tub adenoma, plus mild diverticvs, int hems, etc... ~  CT Abd 9/13 revealed GB to be decompressed, ?wall thickening & ?chr inflamm, extensive atherosclerosis in abd vasculature- consider CTA, sm umbil hernia, severe pelvic organ prolapse;  => HIDA scan 9/13 was neg, wnl...  She has recurrent nausea for which she has seen DrGesnner & DrGerkin...  NEPHROLITHIASIS, HX OF (ICD-V13.01)  Hx of BLADDER CANCER  (ICD-188.9) - had hematuria in 2001 & referred to DrPeterson... 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 all subseq cystoscopies have been neg- no recurrence. ~  she reports f/u w/ DrPeterson yearly & doing satis by her report.  DEGENERATIVE JOINT DISEASE (ICD-715.90) - s/p rotator cuff repair in 1991... s/p left TKR from DrGioffre in 2002...  OSTEOPENIA (ICD-733.90) - off prev ACTONEL 150/mo for a drug holiday, ca++, MVI, etc... ~  I cannot find baseline BMD in EPIC or cone system> ?done by GYN? ~  labs 8/11 showed Vit D level = 22... rec> start Vit D supplement extra 02-1998 u daily... ~  2013:  Actonel removed from her list by DrGessner for drug holiday...  ANXIETY (ICD-300.00) - on XANAX 0.25mg  Prn... she has signif hx of claustrophobia, but denies that she has any anxiety...   Past Surgical History  Procedure Laterality Date  . S/p right rotator cuff repair  1991    Dr. Meade Maw, right  . S/p turbt  10/01    Dr. Vonita Moss  . S/p left tkr  2002    Dr. Darrelyn Hillock  . 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 S/p d&c for removal of endometrial polyp (benign)  12/10    GYN  . Colonoscopy    . Esophagogastroduodenoscopy      Outpatient Encounter Prescriptions as of 09/07/2012  Medication Sig Dispense Refill  . albuterol (PROVENTIL HFA;VENTOLIN HFA) 108 (90 BASE) MCG/ACT inhaler Inhale 2 puffs into the lungs every 6 (six) hours as needed for wheezing.      [name] ALPRAZolam (XANAX) 0.25 MG tablet Take 0.25 mg by mouth daily. Take 1/2 to 1 tablet qd PRN      . apixaban (ELIQUIS) 5 MG TABS tablet Take 5 mg by mouth daily.      . Biotin 5000 MCG TABS Take 1 tablet by mouth daily.      . budesonide (PULMICORT) 0.5  MG/2ML nebulizer solution Take 0.5 mg by nebulization 2 (two)  times daily.      . Coenzyme Q10 (COQ10) 200 MG CAPS Take 1 capsule by mouth daily.      . Cyanocobalamin (VITAMIN B 12 PO) Take by mouth. 1000 mcg once a day       . dexlansoprazole (DEXILANT) 60 MG capsule Take 60 mg by mouth daily.      [name] dextromethorphan-guaiFENesin (MUCINEX DM) 30-600 MG per 12 hr tablet Take 1 tablet by mouth every 12 (twelve) hours.      . famotidine (PEPCID) 20 MG tablet Take 20 mg by mouth daily.      . Fenofibrate (FENOGLIDE) 120 MG TABS Take 1 tablet by mouth daily.      . formoterol (PERFOROMIST) 20 MCG/2ML nebulizer solution Take 20 mcg by nebulization 2 (two) times daily.      . furosemide (LASIX) 20 MG tablet       . metFORMIN (GLUCOPHAGE) 500 MG tablet take 1 tablet by mouth every morning  30 tablet  6  . Multiple Vitamin (MULTIVITAMIN PO) Take by mouth. Once a day       . nystatin (MYCOSTATIN) 100000 UNIT/ML suspension Take 500,000 Units by mouth as needed.      . Probiotic Product (ALIGN PO) Take 1 tablet by mouth daily.      [name] tiotropium (SPIRIVA) 18 MCG inhalation capsule Place 18 mcg into inhaler and inhale daily.      . valsartan-hydrochlorothiazide (DIOVAN-HCT) 160-12.5 MG per tablet TAKE 1 TABLET BY MOUTH ONCE DAILY  30 tablet  11  . verapamil (VERELAN PM) 360 MG 24 hr capsule Take 1 capsule (360 mg total) by mouth at bedtime.  30 capsule  6  . Vitamin D, Ergocalciferol, (DRISDOL) 50000 UNITS CAPS Take 1 capsule (50,000 Units total) by mouth every 7 (seven) days.  4 capsule  12  . [DISCONTINUED] furosemide (LASIX) 20 MG tablet take 1 tablet by mouth once daily if needed for SWELLING  30 tablet  PRN  . [DISCONTINUED] beclomethasone (QVAR) 80 MCG/ACT inhaler Inhale 1 puff into the lungs 2 (two) times daily. With the spacer      . [DISCONTINUED] ipratropium-albuterol (DUONEB) 0.5-2.5 (3) MG/3ML SOLN Take 3 mLs by nebulization daily.      . [DISCONTINUED] Spacer/Aero-Holding Chambers (AEROCHAMBER PLUS) inhaler Use as instructed  1 each  2   No  facility-administered encounter medications on file as of 09/07/2012.    Allergies                                          Pt states INTOL to ALL STATINS...  Allergen Reactions  . Cephalexin     REACTION: nausea and diarrhea  . Codeine     REACTION: nausea  . Epinephrine     REACTION: nervous  . Erythromycin     REACTION: all mycins cause yeast infections  . Morphine     REACTION: nausea  . Sulfamethoxazole W/Trimethoprim     REACTION: nausea and diarrhea    Current Medications, Allergies, Past Medical History, Past Surgical History, Family History, and Social History were reviewed in [employer] record.    Review of Systems         See HPI - all other systems neg except as noted...  The patient complains of hoarseness, dyspnea on exertion, headaches, muscle weakness, and difficulty  walking.  The patient denies anorexia, fever, weight loss, weight gain, vision loss, decreased hearing, chest pain, syncope, , prolonged cough, hemoptysis, abdominal pain, melena, hematochezia, severe indigestion/heartburn, hematuria, incontinence, suspicious skin lesions, transient blindness, depression, unusual weight change, abnormal bleeding, enlarged lymph nodes, and angioedema.     Objective:   Physical Exam     WD, WN, 78 y/o  WF in NAD... GENERAL:  Alert & oriented; pleasant & cooperative... HEENT:  [place]/AT, EOM-wnl, PERRLA, EACs-clear, TMs-wnl, NOSE-clear, THROAT- no thrush, sm aphthous ulcer noted... NECK:  Supple w/ fairROM; no JVD; normal carotid impulses w/o bruits; no thyromegaly or nodules palpated; no lymphadenopathy. CHEST:  Decr BS bilat; no wheezing s/p VATS surg scar on right & mini thoracotomy scar on left.[name] HEART:  Regular Rhythm; without murmurs/ rubs/ or gallops heard... ABDOMEN:  Obese w/ panniculus, soft & nontender; normal bowel sounds; no organomegaly or masses detected. EXT:  S/P left TKR; mild arthritic changes; no varicose veins/ +venous insuffic/ tr  edema. /scattered varicosities, neg homans sign.  NEURO:  CN's intact;  no focal neuro deficits.[name] DERM:  No lesions noted; no rash etc...  Assessment & Plan:

## 2012-09-08 LAB — BASIC METABOLIC PANEL
Calcium: 9.6 mg/dL (ref 8.4–10.5)
GFR: 68.21 mL/min (ref >60.00)
Glucose, Bld: 162 mg/dL — ABNORMAL HIGH (ref 70–99)
Potassium: 4 mEq/L (ref 3.5–5.1)
Sodium: 142 mEq/L (ref 135–145)

## 2012-09-08 LAB — BRAIN NATRIURETIC PEPTIDE: Pro B Natriuretic peptide (BNP): 139 pg/mL — ABNORMAL HIGH (ref 0.0–100.0)

## 2012-09-22 ENCOUNTER — Telehealth: Payer: Self-pay | Admitting: Internal Medicine

## 2012-09-22 NOTE — Telephone Encounter (Signed)
New Problem   Needs a copy of previous echo for insurance purposes. Nurse needs this sent over today.[name]

## 2012-09-25 ENCOUNTER — Other Ambulatory Visit (HOSPITAL_COMMUNITY): Payer: Medicare Other

## 2012-09-25 ENCOUNTER — Ambulatory Visit: Payer: Medicare Other | Admitting: Internal Medicine

## 2012-09-28 ENCOUNTER — Encounter: Payer: Self-pay | Admitting: Internal Medicine

## 2012-09-28 ENCOUNTER — Other Ambulatory Visit (HOSPITAL_COMMUNITY): Payer: Self-pay | Admitting: Radiology

## 2012-09-28 ENCOUNTER — Ambulatory Visit (HOSPITAL_COMMUNITY): Payer: Medicare Other | Attending: Internal Medicine | Admitting: Radiology

## 2012-09-28 ENCOUNTER — Other Ambulatory Visit (HOSPITAL_COMMUNITY): Payer: Self-pay | Admitting: Allergy and Immunology

## 2012-09-28 ENCOUNTER — Ambulatory Visit (INDEPENDENT_AMBULATORY_CARE_PROVIDER_SITE_OTHER): Payer: Medicare Other | Admitting: Internal Medicine

## 2012-09-28 VITALS — BP 100/70 | HR 95 | Ht 62.0 in | Wt 200.4 lb

## 2012-09-28 DIAGNOSIS — J45909 Unspecified asthma, uncomplicated: Secondary | ICD-10-CM | POA: Insufficient documentation

## 2012-09-28 DIAGNOSIS — D131 Benign neoplasm of stomach: Secondary | ICD-10-CM

## 2012-09-28 DIAGNOSIS — I739 Peripheral vascular disease, unspecified: Secondary | ICD-10-CM | POA: Insufficient documentation

## 2012-09-28 DIAGNOSIS — J449 Chronic obstructive pulmonary disease, unspecified: Secondary | ICD-10-CM

## 2012-09-28 DIAGNOSIS — R0602 Shortness of breath: Secondary | ICD-10-CM

## 2012-09-28 DIAGNOSIS — J4489 Other specified chronic obstructive pulmonary disease: Secondary | ICD-10-CM | POA: Insufficient documentation

## 2012-09-28 DIAGNOSIS — I10 Essential (primary) hypertension: Secondary | ICD-10-CM | POA: Insufficient documentation

## 2012-09-28 DIAGNOSIS — Z860100 Personal history of colon polyps, unspecified: Secondary | ICD-10-CM

## 2012-09-28 DIAGNOSIS — E119 Type 2 diabetes mellitus without complications: Secondary | ICD-10-CM | POA: Insufficient documentation

## 2012-09-28 DIAGNOSIS — I4891 Unspecified atrial fibrillation: Secondary | ICD-10-CM

## 2012-09-28 DIAGNOSIS — C3490 Malignant neoplasm of unspecified part of unspecified bronchus or lung: Secondary | ICD-10-CM | POA: Insufficient documentation

## 2012-09-28 DIAGNOSIS — IMO0001 Reserved for inherently not codable concepts without codable children: Secondary | ICD-10-CM

## 2012-09-28 DIAGNOSIS — E785 Hyperlipidemia, unspecified: Secondary | ICD-10-CM | POA: Insufficient documentation

## 2012-09-28 DIAGNOSIS — K219 Gastro-esophageal reflux disease without esophagitis: Secondary | ICD-10-CM

## 2012-09-28 DIAGNOSIS — K317 Polyp of stomach and duodenum: Secondary | ICD-10-CM

## 2012-09-28 DIAGNOSIS — E669 Obesity, unspecified: Secondary | ICD-10-CM | POA: Insufficient documentation

## 2012-09-28 MED ORDER — NYSTATIN 100000 UNIT/ML MT SUSP: 500000.0000 [IU] | OROMUCOSAL | Status: DC | PRN

## 2012-09-28 NOTE — Progress Notes (Signed)
Echocardiogram performed.  

## 2012-09-28 NOTE — Patient Instructions (Addendum)
Please make an appointment with Dr. Kriste Basque or Silver Huguenin, N.P. Regarding your diabetes.   Come back and see [place] in 3 months.   I appreciate the opportunity to care for you.

## 2012-09-28 NOTE — Progress Notes (Signed)
Subjective:    Patient ID: [name], female    DOB: 1934/07/10, 78 y.o.   MRN: [id number]  HPI The patient is here because I had sent her a colonoscopy/EGD recall letter. She had numerous colon adenomas and gastric hyperplastic polyps last year.  She has been bothered by worsening repiratory and functional status since last years procedures 6/29013. Is seeing Dr. Lucie Leather. Saw Jeanmarie Plant, NP recently. Legs were swolloen. Took extra furosemide BS elevated and took 2 metformin for a while also  Now on home O2 dexilant helps heartburn + hoarse in AM  Allergies  Allergen Reactions  . Cephalexin     REACTION: nausea and diarrhea  . Codeine     REACTION: nausea  . Epinephrine     REACTION: nervous, convulsions  . Erythromycin     REACTION: all mycins cause yeast infections  . Morphine     REACTION: nausea  . Sulfamethoxazole W-Trimethoprim     REACTION: nausea and diarrhea   Outpatient Prescriptions Prior to Visit  Medication Sig Dispense Refill  . ALPRAZolam (XANAX) 0.25 MG tablet Take 0.25 mg by mouth daily. Take 1/2 to 1 tablet qd PRN      . apixaban (ELIQUIS) 5 MG TABS tablet Take 5 mg by mouth daily.      . Biotin 5000 MCG TABS Take 1 tablet by mouth daily.      . budesonide (PULMICORT) 0.5 MG/2ML nebulizer solution Take 0.5 mg by nebulization 2 (two) times daily.      . Coenzyme Q10 (COQ10) 200 MG CAPS Take 1 capsule by mouth daily.      . Cyanocobalamin (VITAMIN B 12 PO) Take by mouth. 1000 mcg once a day       . dexlansoprazole (DEXILANT) 60 MG capsule Take 60 mg by mouth daily.      [name] dextromethorphan-guaiFENesin (MUCINEX DM) 30-600 MG per 12 hr tablet Take 1 tablet by mouth every 12 (twelve) hours.      . famotidine (PEPCID) 20 MG tablet Take 20 mg by mouth daily.      . Fenofibrate (FENOGLIDE) 120 MG TABS Take 1 tablet by mouth daily.      . formoterol (PERFOROMIST) 20 MCG/2ML nebulizer solution Take 20 mcg by nebulization 2 (two) times daily.      . furosemide  (LASIX) 20 MG tablet       . metFORMIN (GLUCOPHAGE) 500 MG tablet take 1 tablet by mouth every morning  30 tablet  6  . Multiple Vitamin (MULTIVITAMIN PO) Take by mouth. Once a day       . Probiotic Product (ALIGN PO) Take 1 tablet by mouth daily.      . valsartan-hydrochlorothiazide (DIOVAN-HCT) 160-12.5 MG per tablet TAKE 1 TABLET BY MOUTH ONCE DAILY  30 tablet  11  . verapamil (VERELAN PM) 360 MG 24 hr capsule Take 1 capsule (360 mg total) by mouth at bedtime.  30 capsule  6  . Vitamin D, Ergocalciferol, (DRISDOL) 50000 UNITS CAPS Take 1 capsule (50,000 Units total) by mouth every 7 (seven) days.  4 capsule  12  . albuterol (PROVENTIL HFA;VENTOLIN HFA) 108 (90 BASE) MCG/ACT inhaler Inhale 2 puffs into the lungs every 6 (six) hours as needed for wheezing.      . nystatin (MYCOSTATIN) 100000 UNIT/ML suspension Take 500,000 Units by mouth as needed.      . tiotropium (SPIRIVA) 18 MCG inhalation capsule Place 18 mcg into inhaler and inhale daily.       No  facility-administered medications prior to visit.   Past Medical History  Diagnosis Date  . Asthma   . COPD (chronic obstructive pulmonary disease)   . History of lung cancer   . Hypertension   . Paroxysmal atrial fibrillation   . Peripheral vascular disease   . Venous insufficiency   . Hyperlipidemia   . Diabetes mellitus   . Nontoxic multinodular goiter   . Obesity   . GERD (gastroesophageal reflux disease)   . History of nephrolithiasis   . History of bladder cancer   . DJD (degenerative joint disease)   . Osteopenia   . Anxiety   . History of colon polyps 07/233/2009    Tubular Adenomas  . Diverticulosis of sigmoid colon     mild  . Gastritis     moderate  . Hemorrhoids, internal   . Claustrophobia   . Thyroid nodule   . Chronic anticoagulation     Eliquis   Past Surgical History  Procedure Laterality Date  . S/p right rotator cuff repair  1991    Dr. Meade Maw, right  . S/p turbt  10/01    Dr. Vonita Moss  . S/p left  tkr  2002    Dr. Darrelyn Hillock  . 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 S/p d&c for removal of endometrial polyp (benign)  12/10    GYN  . Colonoscopy    . Esophagogastroduodenoscopy     History   Social History  . Marital Status: Married    Spouse Name: N/A    Number of Children: 2  . Years of Education: N/A   Occupational History  . Retired     Social History Main Topics  . Smoking status: Former Smoker -- 0.50 packs/day for 39 years    Types: Cigarettes    Quit date: 02/04/1994  . Smokeless tobacco: Never Used  . Alcohol Use: Yes     Comment: Occasionally  . Drug Use: No  . Sexual Activity: Not Currently   Other Topics Concern  . None   Social History Narrative   Daily caffeine    Family History  Problem Relation Age of Onset  . Asthma Mother   . Heart failure Mother   . Heart attack Father   . Ulcers Father   . Colon cancer Neg Hx     Review of Systems Knee pain  , blood sugars up to 300 at times  Objective:   Physical Exam Obese Lungs clear w/ prolonged exp phase Heart S1 S2 no rmg    Assessment & Plan:  GERD (gastroesophageal reflux disease)  Gastric polyps  Personal history of colonic polyps  Type II or unspecified type diabetes mellitus without mention of complication, uncontrolled  COPD (chronic obstructive pulmonary disease)  1. We have decided it is not sensible to proceed with routine endoscopy procedures given worsening respiratory and functional status 2. She will return in 3 months to see if it makes sense at that time - these are elective screening/surveillance procedures 3. She is to return to Rubye Oaks, NP or Dr. Kriste Basque about DM-II not controlled (BS > 300 on multiple occasions)  [id number],YNWGN Judie Petit, MD, Laurette Schimke, MD and Rubye Oaks, NP

## 2012-09-30 ENCOUNTER — Other Ambulatory Visit (INDEPENDENT_AMBULATORY_CARE_PROVIDER_SITE_OTHER): Payer: Medicare Other

## 2012-09-30 ENCOUNTER — Encounter: Payer: Self-pay | Admitting: Pulmonary Disease

## 2012-09-30 ENCOUNTER — Ambulatory Visit (INDEPENDENT_AMBULATORY_CARE_PROVIDER_SITE_OTHER): Payer: Medicare Other | Admitting: Pulmonary Disease

## 2012-09-30 VITALS — BP 130/66 | HR 101 | Temp 98.5°F | Ht 61.0 in | Wt 200.4 lb

## 2012-09-30 DIAGNOSIS — M1990 Unspecified osteoarthritis, unspecified site: Secondary | ICD-10-CM

## 2012-09-30 DIAGNOSIS — I10 Essential (primary) hypertension: Secondary | ICD-10-CM

## 2012-09-30 DIAGNOSIS — I872 Venous insufficiency (chronic) (peripheral): Secondary | ICD-10-CM

## 2012-09-30 DIAGNOSIS — M545 Low back pain: Secondary | ICD-10-CM

## 2012-09-30 DIAGNOSIS — E042 Nontoxic multinodular goiter: Secondary | ICD-10-CM

## 2012-09-30 DIAGNOSIS — C3490 Malignant neoplasm of unspecified part of unspecified bronchus or lung: Secondary | ICD-10-CM

## 2012-09-30 DIAGNOSIS — J45909 Unspecified asthma, uncomplicated: Secondary | ICD-10-CM

## 2012-09-30 DIAGNOSIS — M899 Disorder of bone, unspecified: Secondary | ICD-10-CM

## 2012-09-30 DIAGNOSIS — E119 Type 2 diabetes mellitus without complications: Secondary | ICD-10-CM

## 2012-09-30 DIAGNOSIS — F411 Generalized anxiety disorder: Secondary | ICD-10-CM

## 2012-09-30 DIAGNOSIS — K219 Gastro-esophageal reflux disease without esophagitis: Secondary | ICD-10-CM

## 2012-09-30 DIAGNOSIS — J449 Chronic obstructive pulmonary disease, unspecified: Secondary | ICD-10-CM

## 2012-09-30 DIAGNOSIS — C679 Malignant neoplasm of bladder, unspecified: Secondary | ICD-10-CM

## 2012-09-30 DIAGNOSIS — E669 Obesity, unspecified: Secondary | ICD-10-CM

## 2012-09-30 DIAGNOSIS — I4891 Unspecified atrial fibrillation: Secondary | ICD-10-CM

## 2012-09-30 LAB — BASIC METABOLIC PANEL
CO2: 30 mEq/L (ref 19–32)
Chloride: 95 mEq/L — ABNORMAL LOW (ref 96–112)
Creatinine, Ser: 1.2 mg/dL (ref 0.4–1.2)
Sodium: 137 mEq/L (ref 135–145)

## 2012-09-30 MED ORDER — ALPRAZOLAM 0.25 MG PO TABS: ORAL_TABLET | ORAL | Status: DC

## 2012-09-30 MED ORDER — GLIMEPIRIDE 2 MG PO TABS: 2.0000 mg | ORAL_TABLET | Freq: Every day | ORAL | Status: DC

## 2012-09-30 MED ORDER — METFORMIN HCL 500 MG PO TABS: ORAL_TABLET | ORAL | Status: DC

## 2012-09-30 MED ORDER — VALSARTAN 160 MG PO TABS: 160.0000 mg | ORAL_TABLET | Freq: Every day | ORAL | Status: DC

## 2012-09-30 MED ORDER — FUROSEMIDE 20 MG PO TABS: 20.0000 mg | ORAL_TABLET | Freq: Every day | ORAL | Status: DC

## 2012-09-30 NOTE — Patient Instructions (Addendum)
Today we updated your med list in our EPIC system...    Continue your current medications the same...  Continue all your breathing meds w/ adjustments made by DrKozlow...  Today we rechecked your DIABETIC labs>>    We will contact you w/ the results when available...   We have decided to increase your DM meds & try to keep you off insulin if poss>>    Increase the METFORMIN 500mg  - one tab twice daily at breakfast 7 dinner...    Add the new GLIMEPIRIDE 2mg  one tab at breakfast...  Be sure to follow the world's BEST low carb diet- no sugars no sweets, no pastas, no breads...    Look up "THE GLYCEMIC INDEX" on google 7 avoid foods w/ the hi glycemic index (ok to eat the foods w/ low index)...  Remember to avoid sodium/ salt- see diet handout...    Take the Lasix 20mg  every AM...  Work on weight reduction...  Let's plan a follow up visit in 6 weeks to see how your sugar has responded.Marland KitchenMarland Kitchen

## 2012-10-01 NOTE — Progress Notes (Signed)
Subjective:    Patient ID: [name], female    DOB: Feb 11, 1934, 78 y.o.   MRN: [id number]  HPI 78 y/o WF here for a follow up visit... she has mult medical specialists who follow all of her medical problems (see below)...  ~  December 05, 2010:  54mo ROV & she notes incr SOB w/ activ, & states she never got back to her prev baseline after last hosp & upset because she missed 2 trips; she notes that she hurt her back & hasn't been exercising because of this- since then incr SOB w/ some ADLs and incr edema in her legs;  She notes that she "can't get a deep breath" & she appears more anxious than usual w/ mult somatic complaints- indigestion, cramps in hands & feet, etc...  We discussed taking the Alprazolam 0.25mg  Tid regularly for her chest wall musc spasm so she can get a good deep breath & see how this works (she is asking about Accolate because a friend is on it & it really helped them)...    COPD w/ revers component> on Advair500 but only using it once/d (she stopped [facility] stating it made her mouth sore), Spiriva, Mucinex, & Proventil HFA; asked (again) to maximize her regimen w/ AdvairBid, Mucinex2Bid, Fluids, etc; last PFT w/ FEV1=1.08 stable; see above...    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 NOTE: element of lung restriction from surg & obesity...    HBP> on DiovanHCT, Verapamil, & Lasix prn; BP= 144/80 & similar at home; she has mult somatic complaints...    Ven Insuffic> she knows to elim sodium but she eats out often, elev legs, wear support hose; she has been taking the Lasix20mg  daily due to the swelling & improved...     Hyperlipid> on Fenoglide120 & FishOil; she states INTOL to all statins; last FLP 8/12 in hosp as below; asked to ret for fasting blood work...    DM> on  Metform500/d; BS=141, A1c=7.0; she will need more meds if she can't get on diet 7 get weight down...    Obesity> wt=198# which is up 12# in the last 54mo!!! We reviewed low carb, low fat, wt reducing diet 7 exercise prescription...    Multinod Goiter> prev eval from [place] DrGerkin; TFTs showed TSH=0.97, and FreeT3/ FreeT4 are wnl...    GI> GERD, Polyps> on Prilosec & had episode of choking on dinner w/ resp exac- see speech path eval; followed by DrGessner for GI w/ 66yr f/u colonoscopy due soon...    GU> Bladder Cancer> Papilllary TCCa resected 2001 & no recurrence; followed by DrPeterson w/ yearly cystos neg...    DJD/ LBP> on musc relaxer & Advil; prev rotator cuff surg 1991, left TKR 2002, now followed by DrRamos for LBP & plans shot in back (she reports it's improved spontaneously)...    Osteopenia> on Actonel, calcium, MVI, Vit D; ?when her last BMD was done?    Anxiety> as noted- she has Alpraz for prn use but doesn't take it often...  ~  April 30, 2011:  19mo ROV & [name] has been stable w/ her severe lung disease as noted; unfortunately she still self-adjusts her meds despite my admonition to take meds regularly as prescribed for max benefit; she tells me she has "6 trips" coming up & she wants her "magic  shot" meaning DepoMedrol; we have on numerous occas discussed the importance of minimizing the cortisone Rx & maximizing her bronchdilators etc; plus we have implored her to decrease her aggressive travel schedule fearing that she will have a resp exacerbation (w/ acute resp failure) [place]... See prob list below>> LABS 3/13:  FLP- not at goal on Feno120;  Chems- ok x BS=124 A1c=6.7 on Metform monotherapy...  ~  October 31, 2011:  67mo ROV & [name] states she decided to "get healthy" & hired a Systems analyst but during her 1st session she developed an irreg tachyarrhythmia & was Dx w/ AFib & rvr> seen by [employer] & DrAllred & treated w/ Eliquis5Bid, Metoprolol25-1/2Bid,  along w/ her Verelan & DiovanHCT;  2DEcho showed modLVH, norm LVF w/ EF=55-60%, no regional wall motion abn, mildMR, mildLAdil, no ASD/PFO, PAsys=31;  Now improved & feeling better...    She was also being evaluated by DrGessner for nausea x57mo> she states nothing helps- on Zofran, Align, etc;  He did EGD/ Colon 6/13 w/ gastric polyps (hyperplastic), colon polyps (largest 94mm= tub adenoma), mild divertics, int hems;  He did AbdSonar- normal GB, no dilatation, +hepatic steatosis; Then CT Abd showed a decompressed GB, some wall thickening ?inflamm, and other findings like extensive atherosclerotic dis in abd, sm umbil hernia, & severe pelvic organ prolapse; finally HIDA scan normal; she tells me she has appt w/ DrGerkin to consider GB surg...    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 2) History of stage IA (T1, N0, MX) non-small cell lung cancer, adenocarcinoma with bronchoalveolar features diagnosed in November 2005 involving the right upper lobe. Prior Therapy: 1) Status post right upper lobectomy on January 03, 2004. 2) Status post wedge resection of the left upper lobe on February 06, 2009 under the care of Dr. Edwyna Shell. Current Therapy:  Observation    We reviewed prob list, meds, xrays and labs> see below for updates >> she will get the 2013 flu shot at [facility]...  ~  April 30, 2012:  67mo ROV & [name] has had a lot going on> saw TP 1/14 w/ resp exac c/o incr SOB, cough, wheezing, tightness off & on for 27mo; took Avelox=> Augmentin, Depo, Pred, Mucinex, Pepcid, Hydromet, etc;  Then she had OV 3/14 w/ DrWert w/ barking cough, hoarseness, etc> he stopped her Advair & changed to Dulera100, gave her Depo120 & stressed Pepcid & antireflux regimen...  She ret for 1wk f/u after changes by  DrWert but she is no better, upset that the depo didn't help this time but true-to-form she has adjusted her own meds & we discussed regimen & need for regular dosing of all meds- Dulera100-2spBid, Spiriva daily, Mucinex-2Bid, fluids, along w/ her antireflux regimen, elev HOB, Pepcid Bid, etc...    COPD w/ revers component> on Dulera100 now but only using it once/d, Spiriva, Mucinex, & Proventil HFA; asked (again) to maximize her regimen w/ Dulera100-2spBid, Mucinex2Bid, Fluids, etc; last PFT w/ FEV1=1.08 stable; see below...    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 NOTE: element of lung restriction from surg & obesity.Marland KitchenMarland Kitchen  HBP> on Metoprolol25-1/2Bid, DiovanHCT160-12.5, Verapamil360, & Lasix20 prn; BP= 140/60 & similar at home; she has mult somatic complaints...    PAFib> She had recurrent PAF 9/13 while exercising; seen by [facility] & treated w/ Eliquis5Bid, Metoprolol added, along w/ her Verelan & DiovanHCT; maintaining NSR & denies CP, palpit, dizzy, etc; tolerating Rx well- no changes made; he did Myoview 10/13 which was neg- no ischemia, normal wall motion, EF=74%...     Ven Insuffic> she knows to elim sodium but she eats out often, elev legs, wear support hose; she has been taking the Lasix20mg  daily due to the swelling & improved...     Hyperlipid> off prev Fenoglide & on FishOil, CoQ10, diet; she states INTOL to all statins; FLP 3/14 shows TChol 196, TG 84, HDL 46, LDL 133    DM> on [id number]; labs 3/14 shows BS= not done, A1c=6.9 (stable); she is advised to continue same, get on diet & get weight down...    Obesity> wt=203# which is up 5# further; We reviewed low carb, low fat, wt reducing diet & exercise prescription...    Multinod Goiter> prev eval from [place] DrGerkin; TFTs  showed TSH=0.98, and prev FreeT3/ FreeT4 were wnl...    GI> GERD, Polyps> on Prilosec/Pepcid but won't take them regularly & has hx episode of choking on dinner w/ resp exac- see prev speech path eval; followed by DrGessner for GI w/ EGD/Colon 6/13 showing mult hyperplastic polyps in stomach, & Colon w/ divertics, AVM, int hems, & numerous sm polyps (max60mm)- tub adenomas w/ repeat planned 64yr...    GU> Bladder Cancer> Papilllary TCCa resected 2001 & no recurrence; followed by DrPeterson w/ yearly cystos neg...    DJD/ LBP> on musc relaxer & Advil; prev rotator cuff surg 1991, left TKR 2002, now followed by DrRamos for LBP & plans shot in back (she reports it's improved spontaneously)...    Osteopenia> prev on Actonel (she stopped on her own), calcium, MVI, Vit D; ?when her last BMD was done?    Anxiety> as noted- she has Alpraz0.25mg  for prn use but doesn't take it often despite being asked to take it Tid regularly... We reviewed prob list, meds, xrays and labs> see below for updates >> prolonged OV requiring >57min review of data & discussion w/ pt... LABS 3/14:  FLP- improved but LDL=133 on diet alone, refuses meds;  Chems- not done as requested just LFTs=wnl;  TSH=0.98;  A1c=6.9   ~  May 07, 2012:  1wk ROV & add-on at pt request ("DrMohammed told me to come") stating no better w/ cough, congestion, SOB, and specifically notes sore mouth & tongue from the [facility] (she c/o this same complic in past when tried in 2012);  We reviewed her fixed obstructive disease & reactive airways dis component- and the need for regular dosing of all meds which address the mult components of her lung problem (ie- COPD, revers component, reflux, restriction, anxiety) but she is only concerned w/ not missing a trip to the beach this weekend...  We decided to check PFT, give her a NEB treatment, and recheck PFT (not signif reversibility found)...  We will stop the [facility], change to Symbicort160-2spBid via spacer & treat sore  mouth w/ MMW; she wants Medrol dosepak to take to the beach...  Finally I have recommended allergy/immunology eval from DrKozlow for his input ...    PFT pre-bronchodil>> FVC=1.83 (72%), FEV1=0.74 (39%), %1sec=41, mid-flows=16% predicted...    Given NEB Rx w/ Albuterol...    Repeat PFT post-bronchdil>>  FVC=1.68 (67%), FEV1=0.87 (46%), %1sec=51, mid-flows=19% predicted...    O2 sat= 93% on RA at rest, and only dropped to 92% on RA after 1lap in office w/ HR=100...  She had 70mo f/u appt w/ DrMohammed 05/06/12 for f/u of her lung cancers> CT Chest 05/04/12 showed s/p bilat upper lobe surg w/ post-op scarring, stable 4mm ground-glass nodule in ant left lung w/o change, atherosclerosis of Ao & coronaries, heterogeneous thyroid, no adenopathy... They plan another f/u in 70mo. 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 2) History of stage IA (T1, N0, MX) non-small cell lung cancer, adenocarcinoma with bronchoalveolar features diagnosed in November 2005 involving the right upper lobe. Prior Therapy: 1) Status post right upper lobectomy on January 03, 2004. 2) Status post wedge resection of the left upper lobe on February 06, 2009 under the care of Dr. Edwyna Shell. Current Therapy:  Observation  ~  July 14, 2012:  48mo ROV & [name] reports that she is better- best she's been in several months, on NEBS per DrKozlow ("I really like him"), +QVar via Aerochamber, had ONO & started on Oxygen 1.5L Qhs (she checks her own oximetry now); despite her improvement- she still wants a Depo shot prior to her upcoming trip "very active & I don't want to miss a thing";  DrKozlow sent her to [facility] for ENT eval & this was neg per pt...     We reviewed prob list, meds, xrays and labs> see below for updates >>    ~  September 30, 2012:  2-73mo ROV & add-on per DrGessner due to edema> pt states that when she saw DrGessner 8/25 she was very swollen & it has resolved over the past 2d  "it's a miracle" she says; GI decided to forgo any f/u colonoscopy at this time... She has also been experiencing elev BS that has not responded to her Metform500/d... We reviewed the following medical problems during today's office visit >>     COPD w/ revers component> very severe dis w/ FEV1<1L; DrKozlow has been attempting to maximize her bronchodil meds but frequent med changes have been difficult for pt & she is confused; last note 7/14 indicates she was doing OK but w/ signif DOE & he did ONO w/ desat<88% for 8.5% of the time=> started on noct O2 at 1.5L/min; he switched her to Perforomist/ Budesonide via NEBS Bid +Spiriva daily...    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 Note: element of superimposed restrictive dis due to 2 surgeries...    HBP> on DiovanHCT160-12.5, Verapamil360, & Lasix20; BP= 130/66 & similar at home; she has mult somatic complaints; recent incr edema lead to regular Lasix dosing- therefore we will elim the HCT & use plain Diovan160; reminded to elim salt/ sodium...     PAFib> She had recurrent PAF 9/13 while exercising; seen by [facility] & treated w/ Eliquis5Bid & meds adjusted; maintaining NSR & denies CP, palpit, dizzy, etc; he did Myoview 10/13 which was neg- no ischemia, normal wall motion, EF=74%; she had f/u DrAllred 7/14> she was not compliant w/ Eliquis Bid- encouraged to take it regularly but she has continued to take it just once daily!...    Ven Insuffic> she knows to  elim sodium (but she eats out often), elev legs, wear support hose; she has been taking the Lasix20mg  daily & improved...     Hyperlipid> on Feno120 & on FishOil, CoQ10, diet; she states  INTOL to all statins; FLP 3/14 shows TChol 196, TG 84, HDL 46, LDL 133    DM> on [id number]; she notes BS at home in the 200-300 range now; Labs 8/14 showed BS=227, A1c=7.4 & rec to increase Metform500Bid & add Glimep2mg Qam.    Obesity> wt=200# which is about the same; We reviewed low carb, low fat, wt reducing diet & exercise prescription...    Multinod Goiter> prev eval from [place] DrGerkin; TFTs 3/14 showed TSH=0.98, and prev FreeT3/ FreeT4 were wnl...    GI> GERD, Polyps> on Dexilant60/Pepcid (per DrKozlow) but won't take them regularly & has hx episode of choking on dinner w/ resp exac- see prev speech path eval; followed by DrGessner for GI w/ EGD/Colon 6/13 showing mult hyperplastic polyps in stomach, & Colon w/ divertics, AVM, int hems, & numerous sm polyps (max49mm)- tub adenomas w/ repeat planned 22yr but they decided to wait w/ her resp difficulties...    GU> Bladder Cancer> Papilllary TCCa resected 2001 & no recurrence; prev followed by DrPeterson w/ yearly cystos neg=> needs Urology f/u when able...    DJD/ LBP> on OTC meds prn; prev rotator cuff surg 1991, left TKR 2002, now followed by DrRamos for LBP & plans shot in back (she reports it's improved spontaneously)...    Osteopenia> prev on Actonel (she stopped on her own), calcium, MVI, Vit D; ?when her last BMD was done=> needs f/u when able...    Anxiety> as noted- she has Alpraz0.25mg  for prn use but doesn't take it often despite being asked to take it Tid regularly... We reviewed prob list, meds, xrays and labs> see below for updates >>  LABS 8/14:  Chems- ok x BS=227, A1c=7.4, Ca=10.9.Marland KitchenMarland Kitchen          Problem List:      ASTHMA (ICD-493.90) / COPD (ICD-496) - ex-smoker, quit 1997... she was participating in [facility] (last 3/09) & stopped on her own... may have a superimposed component of restriction due to obesity & prev lung surgeries... ~  11/05: s/p RULobectomy for lung ca- (adeno w/ bronchoalveolar cell features). ~   PFT 8/08 showed FVC=2.02 (77%), FEV1=1.07 (51%), %1sec=53, mid-flows=19%pred. ~  PFT 7/09 today= FVC=1.93 (72%), FEV1=1.04 (49%), %1sec=54, mid-flows=19%pred. ~  1/11:  s/p LUL resection by DrBurney> multicentric bronchoalveolar cell cancer. ~  PFT 8/12 showed FVC=2.25 (88%), FEV1=1.08 (56%), %1sec=48, mid-flows=30% pred. ~  10/12:  She reports Dulera caused sore mouth so she switched back to Advair500 but asked to do this Bid regularly. ~  2013:  She won't stick w/ any therapy regularly & continues to decr her dosing on her own despite freq exacerbations... ~  3/14:  Now on Dulera100 per DrWert (despite prev hx of sore mouth) but only using it once/d, Spiriva, Mucinex, & Proventil HFA; asked (again) to maximize her regimen w/ Dulera100-2spBid, Mucinex2Bid, Fluids, etc; last PFT w/ FEV1=1.08 stable... ~  4/14:  Add-on appt c/o no better & sore mouth from the [facility]; treat w/ MMW & change to Symbicort160-2spBid via spacer; she requests MedrolDosepak- ok; we discussed referral to DrKozlow for his input> she has fixed obstruction, revers component, restrictive component (surg & obese), reflux component, & anxiety... ~  PFTs 4/14 showed>  pre-bronchodil>> FVC=1.83 (72%), FEV1=0.74 (39%), %1sec=41, mid-flows=16% predicted...  post-bronchdil>> FVC=1.68 (67%), FEV1=0.87 (  46%), %1sec=51, mid-flows=19% predicted. ~  4/14:  She had eval by DrKozlow & he is following regularly w/ freq med changes that has her confused- she is encouraged to call his office for clarification... ~  8/14: very severe dis w/ FEV1<1L; DrKozlow has been attempting to maximize her bronchodil meds but frequent med changes have been difficult for pt & she is confused; last note 7/14 indicates she was doing OK but w/ signif DOE & he did ONO w/ desat<88% for 8.5% of the time=> started on noct O2 at 1.5L/min; he switched her to Perforomist/ Budesonide via NEBS Bid +Spiriva daily...  Hx of LUNG CANCER (ICD-162.9) - Hx multicentric  bronchoalveolar cell lung cancer >> followed by DrBurney for CVTS & DrMohammed for Oncology... ~  s/p right upper lobectomy by DrBurney 11/05 for a stage 1A non-small cell lung cancer (adenocarcinoma w/ bronchalveolar cell features); post-op observation only... ~  CT Chest 11/08 showed no recurrence, mult bilat nodules unchanged x3+ yrs, nodular thyroid w/o change... ~  CXR 7/09 showed stable post-op changes and scarring on the right, NAD.[name]. ~  CTAngio Chest 10/09 showed neg for PE, prom thyroid, atherosclerotic changes in Ao, no change in ground-glass nodules in LUL area... ~  CT Chest 11/10 by DrMohammed showed new LUL solid nodule, no change in ground-glass areas... lesion was PET pos... ~  1/11:  s/p LULobectomy & node dissection via minithoracotomy by DrBurney- path showed 3 foci of well diff bronchoalveolar cell ca & neg nodes... decision made at conference for no chemoRx, EGFR assay was neg... ~  she continues to have monitoring by DrBurney/ DrMohammed> on observation alone now; note from DrMohammed 9/12 indicated she was stable & he plans f/u CT Chest & OV in 72mo... ~  CT Chest 9/12 per DrMohammed showed stable ?9mm ground glass opac LUL w/o change from prev; +post op changes, COPD, coronary calcif, & hep steatosis... ~  CT Chest 9/13 per DrMohammed showed 4mm ground glass nodule ant LUL, postop bilat scarring, normal heart size w/ calcif Ao & coronaries, no adenopathy... ~  CT Chest 05/04/12 showed s/p bilat upper lobe surg w/ post-op scarring, stable 4mm ground-glass nodule in ant left lung w/o change, atherosclerosis of Ao & coronaries, heterogeneous thyroid, no adenopathy... ~  She has appt for f/u CT Chest and visit w/ DrMohammed 10/14...  HYPERTENSION (ICD-401.9) >>  ~  Cardiac eval 9/09 by Walker Kehr was neg and 2DEcho showed DD w/ norm LVsys function, EF= 60-65%, no regional wall motion abn... ~  3/13:  BP= 122/72 & feeling OK, tolerating Rx (DiovHct 160/12.5 & Verelan240)... denies CP,  palipit, dizziness, syncope, ch in dyspnea, edema, etc...  ~  9/13:  BP= 114/62 & meds adjusted in the interval w/ DiovHct 160/12.5 & Verelan360 (plus Metop25-1/2Bid now)... ~  3/14:  BP= 140/60 & similar at home; she has mult somatic complaints as noted... ~  6/14:  BP= 122/70 & similar at home; no change in her chronic symptoms; Metoprolol removed by Cards... ~  8/14:  on DiovanHCT160-12.5, Verapamil360, & Lasix20; BP= 130/66 & similar at home; she has mult somatic complaints; recent incr edema lead to regular Lasix dosing- therefore we will elim the HCT & use plain Diovan160; reminded to elim salt/ sodium.   PAROXYSMAL ATRIAL FIBRILLATION (ICD-427.31) - hx PAF in the post op period... converted to NSR & holding... transiently on Amiodarone in past & she wanted off Rx.  ~  Developed AFib w/ rvr 9/13 & eval by DrAllred> treated w/ Eliquis5Bid,  Metoprolol25-1/2Bid, along w/ her Verelan & DiovanHCT... ~  2DEcho 9/13 showed modLVH, norm LVF w/ EF=55-60%, no regional wall motion abn, mildMR, mildLAdil, no ASD/PFO, PAsys=31 ~  Myoview 10/13 was NEG- no ischemia, normal wall motion, EF=74% ~  3/14:  She decreased the Metop & Eliquis to once daily on her own & was asked by Cards to take both Bid as directed... ~  8/14:  She had recurrent PAF 9/13 while exercising; seen by [facility] & treated w/ Eliquis5Bid & meds adjusted; maintaining NSR & denies CP, palpit, dizzy, etc; he did Myoview 10/13 which was neg- no ischemia, normal wall motion, EF=74%; she had f/u DrAllred 7/14> she was not compliant w/ Eliquis Bid- encouraged to take it regularly but she has continued to take it just once daily![name]..   PERIPHERAL VASCULAR DISEASE (ICD-443.9) - she has atherosclerotic changes in her Ao (& coronaries) noted on her prev scans...  ~  11/10: pt had mult questions about this problem on the prob list- discussed "hardening of the arteries" in detail.  VENOUS INSUFFICIENCY (ICD-459.81) - she knows to follow a low  sodium diet, elevate legs, wear support hose, etc; she insists on keeping Lasix 20mg  on hand for Prn use...  HYPERLIPIDEMIA (ICD-272.4) - prev on Livolo 2mg - 1/2 tab daily (stopped due to aching), +FENOGLIDE 120mg /d, FISH OIL & CoQ10  supplements... prev on Vytorin but INTOL ==> she states INTOL to all statins... she was not satis w/ the Lipid Clinic in the past. ~  labs 8/08 off Vytorin showed TChol 183, TG 207, HDL 46, LDL 112... try fenofibrate... ~  FLP 5/09 on Feno120 showed TChol 188, TG 111, HDL 28, LDL 137... cont same, better diet, get wt down! ~  FLP 4/10 on Feno120 showed TChol 240, TG 104, HDL 49, LDL 165... I rec f/u Lipid Clinic, she declined. ~  FLP 7/10 on Feno120 showed TChol 222, TG 152, HDL 36, LDL 172... rec> try LIVALO 2mg - 1/2 tab Qhs, she stopped. ~  FLP 11/10 on Feno120+FishOil showed TChol 253, TG 125, HDL 42, LDL 208... try LIVALO2mg - 1/2 tab/d & stay on it. ~  FLP 1/11 on Liv1mg +Feno120 showed TChol 170, TG 101, HDL 45, LDL 105... continue same. ~  3/11: she reports aching all over & DrBurney stopped the Livolo> contin diet + other meds. ~  FLP 8/11 showed TChol 225, TG 238, HDL 41, LDL 156... INTOL all statins, she'll do the best she can w/ diet. ~  FLP 8/12 in hosp showed TChol 234, TG 276, HDL 52, LDL 127... Counseled on low chol, low fat, wt reducing diet. ~  FLP 3/13 on Feno120 showed TChol 221, TG 85, HDL 56, LDL 159... She refuses Statin Rx or Lipid Clinic referral... ~  FLP 9/13 on Feno120+diet showed TChol 183, TG 126, HDL 37, LDL 121 ~  FLP 3/14 on Feno120+diet showed TChol 196, TG 84, HDL 46, LDL 133  DIABETES MELLITUS (ICD-250.00) - started on METFORMIN 500mg Bid 4/10, but decr on her own to 1 daily 1/11... we had stressed the importance of diet- low carb/ low fat and weight reduction, along w/ her pulm rehab exercises... ~  labs 4/10 showed BS= 131, HgA1c= 7.0.Marland KitchenMarland Kitchen Metformin500Bid started. ~  labs 7/10 showed BS= 134, A1c= 6.0 ~  labs 11/10 showed BS= 148, A1c=  6.5 ~  1/11:  she cut the Metformin to 1/d due to nausea. ~  labs 8/11 showed BS= 137, A1c= 5.7.Marland KitchenMarland Kitchen continue same, get wt down. ~  Labs 8/12 in  hosp on Metform500/d showed BS= 110-230, A1c=6.5 ~  Labs 10/12 on Metform500/d showed BS= 141, A1c=7.0.Marland KitchenMarland Kitchen Needs better diet, get wt down, or more meds. ~  1/13:  Ophthalmology check up by DrMcCuen> neg- no diabetic retinopathy... ~  Labs 3/13 on Metform500/d showed BS= 124, A1c= 6.7 ~  Labs 3/14 on Metform500/d showed BS= not done, A1c= 6.9 ~  8/14:  on Metform500Qam; she notes BS at home in the 200-300 range now; Labs 8/14 showed BS=227, A1c=7.4 & rec to increase Metform500Bid & add Glimep2mg Qam.  NONTOXIC MULTINODULAR GOITER (ICD-241.1) - eval and rx by DrBalan for Endocrinology & DrGerkin for CCS; she is asymptomatic; dominant nodule was needled and benign; surg consult from DrGerkin- elected observation & he checks her yearly... ~  seen 6/10 by DrGerkin- f/u sonar w/o change in any of the nodules... ~  labs 7/10 showed TSH= 0.89 ~  seen 6/11 by DrGerkin- f/u sonar w/o change in nodules. ~  Labs 8/12 showed TSH= 0.047... Not on Thyroid meds, needs f/u DrBalan (she never went). ~  Labs 10/12 showed TSH= 0.97, FreeT3= 3.2 (2.3-4.2), FreeT4= 0.88 (0.60-1.60) ~  Labs 7/13 showed TSH= 0.68 ~  Labs 3/14 showed TSH= 0.98  OBESITY (ICD-278.00) - obese w/ abd panniculus & we discussed diet + exercise strategies.[name]. ~  weight up to 205# 11/09- we discussed diet, calorie restriction, exercise, & get the weight down... ~  weight 4/10 = 198# ~  weight 7/10 = 194# ~  weight 11/10 = 196# ~  weight 2/11 = 184# (post op) ~  weight 8/11 = 181# ~  weight 11/11 = 186#... she needs to do better! ~  Weight 8/12 = 186# ~  Weight 10/12 = 198#... What happened? ~  Weight 3/13 = 189#... Keep up the good work! ~  Weight 9/13 = 184# ~  Weight 3/14 = 203# ~  Weight 6/14 = 203# ~  Weight 8/14 = 200#  GERD (ICD-530.81) >>  ~  EDG 6/13 by DrGessner showed mult gastric  polyps (hyperplastic), mod gastritis, & treated w/ Omep20mg /d (she subseq stopped on her own)... ~  4/14:  DrKozlow has tried to maximize her antireflux regimen to help her breathing as well=> on Dexilant60 & Pepcid20...  COLON POLYPS, DIVERTICULOSIS, HEMORRHOIDS >> ~  colonoscopy 7/09 by DrGessner showed 4 sm polyps= tubular adenoma on bx... f/u planned 3 yrs... ~  Colonoscopy 6/13 by DrGessner showed mult sm polyps, largest 78mm= tub adenoma, plus mild diverticvs, int hems, etc... ~  CT Abd 9/13 revealed GB to be decompressed, ?wall thickening & ?chr inflamm, extensive atherosclerosis in abd vasculature- consider CTA, sm umbil hernia, severe pelvic organ prolapse;  => HIDA scan 9/13 was neg, wnl...  She has recurrent nausea for which she has seen DrGesnner & DrGerkin...  NEPHROLITHIASIS, HX OF (ICD-V13.01)  Hx of BLADDER CANCER (ICD-188.9) - had hematuria in 2001 & referred to DrPeterson... 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 all subseq cystoscopies have been neg- no recurrence. ~  she reports f/u w/ DrPeterson yearly & doing satis by her report. ~  She hasn't seen Urology since DrPeterson retired; she denies urinary symptoms & she is encouraged to sched f/u appt w/ DrWoodruff et al...  DEGENERATIVE JOINT DISEASE (ICD-715.90) - s/p rotator cuff repair in 1991... s/p left TKR from DrGioffre in 2002...  OSTEOPENIA (ICD-733.90) - off prev ACTONEL 150/mo for a drug holiday, ca++, MVI, etc... ~  I cannot find baseline BMD  in EPIC or cone system> ?done by GYN? ~  labs 8/11 showed Vit D level = 22... rec> start Vit D supplement extra 02-1998 u daily... ~  2013:  Actonel removed from her list by DrGessner for drug holiday...  ANXIETY (ICD-300.00) - on XANAX 0.25mg  Prn... she has signif hx of claustrophobia, but denies that she has any anxiety...   Past Surgical History  Procedure Laterality Date  . S/p right rotator cuff repair   1991    Dr. Meade Maw, right  . S/p turbt  10/01    Dr. Vonita Moss  . S/p left tkr  2002    Dr. Darrelyn Hillock  . 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 S/p d&c for removal of endometrial polyp (benign)  12/10    GYN  . Colonoscopy    . Esophagogastroduodenoscopy      Outpatient Encounter Prescriptions as of 09/30/2012  Medication Sig Dispense Refill  . ALPRAZolam (XANAX) 0.25 MG tablet Take 1/2 to 1 tablet TID  90 tablet  5  . apixaban (ELIQUIS) 5 MG TABS tablet Take 5 mg by mouth daily.      [name] azithromycin (ZITHROMAX) 1 G powder Take 1 packet by mouth once. 3 more weeks      . Biotin 5000 MCG TABS Take 1 tablet by mouth daily.      . budesonide (PULMICORT) 0.5 MG/2ML nebulizer solution Take 0.5 mg by nebulization 2 (two) times daily.      . Coenzyme Q10 (COQ10) 200 MG CAPS Take 1 capsule by mouth daily.      . Cyanocobalamin (VITAMIN B 12 PO) Take by mouth. 1000 mcg once a day       . dexlansoprazole (DEXILANT) 60 MG capsule Take 60 mg by mouth daily.      [name] dextromethorphan-guaiFENesin (MUCINEX DM) 30-600 MG per 12 hr tablet Take 1 tablet by mouth every 12 (twelve) hours.      . famotidine (PEPCID) 20 MG tablet Take 20 mg by mouth daily.      . Fenofibrate (FENOGLIDE) 120 MG TABS Take 1 tablet by mouth daily.      . furosemide (LASIX) 20 MG tablet Take 1 tablet (20 mg total) by mouth daily.  30 tablet  6  . ipratropium (ATROVENT) 0.02 % nebulizer solution Take 500 mcg by nebulization 2 (two) times daily. 0.5 mg by nebulizaiton 2 times daily albuterol sulfate 3 mg take 20 mcg by neb 2 times daily      . metFORMIN (GLUCOPHAGE) 500 MG tablet take 1 tablet by mouth two times daily  60 tablet  6  . Multiple Vitamin (MULTIVITAMIN PO) Take by mouth. Once a day       . nystatin (MYCOSTATIN) 100000 UNIT/ML suspension Take 5 mLs (500,000  Units total) by mouth as needed.  60 mL    . Probiotic Product (ALIGN PO) Take 1 tablet by mouth daily.      . verapamil (VERELAN PM) 360 MG 24 hr capsule Take 1 capsule (360 mg total) by mouth at bedtime.  30 capsule  6  . Vitamin D, Ergocalciferol, (DRISDOL) 50000 UNITS CAPS Take 1 capsule (50,000 Units total) by mouth every 7 (seven) days.  4 capsule  12  . [DISCONTINUED] ALPRAZolam (XANAX) 0.25 MG tablet Take 0.25 mg by mouth daily. Take 1/2  to 1 tablet qd PRN      . [DISCONTINUED] furosemide (LASIX) 20 MG tablet Take 20 mg by mouth daily.       . [DISCONTINUED] metFORMIN (GLUCOPHAGE) 500 MG tablet take 1 tablet by mouth every morning  30 tablet  6  . [DISCONTINUED] valsartan-hydrochlorothiazide (DIOVAN-HCT) 160-12.5 MG per tablet TAKE 1 TABLET BY MOUTH ONCE DAILY  30 tablet  11  . glimepiride (AMARYL) 2 MG tablet Take 1 tablet (2 mg total) by mouth daily before breakfast.  30 tablet  6  . valsartan (DIOVAN) 160 MG tablet Take 1 tablet (160 mg total) by mouth daily.  30 tablet  6  . [DISCONTINUED] formoterol (PERFOROMIST) 20 MCG/2ML nebulizer solution Take 20 mcg by nebulization 2 (two) times daily.       No facility-administered encounter medications on file as of 09/30/2012.    Allergies                                          Pt states INTOL to ALL STATINS...  Allergen Reactions  . Cephalexin     REACTION: nausea and diarrhea  . Codeine     REACTION: nausea  . Epinephrine     REACTION: nervous  . Erythromycin     REACTION: all mycins cause yeast infections  . Morphine     REACTION: nausea  . Sulfamethoxazole W/Trimethoprim     REACTION: nausea and diarrhea    Current Medications, Allergies, Past Medical History, Past Surgical History, Family History, and Social History were reviewed in [employer] record.    Review of Systems         See HPI - all other systems neg except as noted...  The patient complains of hoarseness, dyspnea on exertion,  headaches, muscle weakness, and difficulty walking.  The patient denies anorexia, fever, weight loss, weight gain, vision loss, decreased hearing, chest pain, syncope, peripheral edema, prolonged cough, hemoptysis, abdominal pain, melena, hematochezia, severe indigestion/heartburn, hematuria, incontinence, suspicious skin lesions, transient blindness, depression, unusual weight change, abnormal bleeding, enlarged lymph nodes, and angioedema.     Objective:   Physical Exam     WD, WN, 78 y/o  WF in NAD... GENERAL:  Alert & oriented; pleasant & cooperative... HEENT:  [place]/AT, EOM-wnl, PERRLA, EACs-clear, TMs-wnl, NOSE-clear, THROAT- no thrush, sm aphthous ulcer noted... NECK:  Supple w/ fairROM; no JVD; normal carotid impulses w/o bruits; no thyromegaly or nodules palpated; no lymphadenopathy. CHEST:  Decr BS bilat; scat wheezing/ rhonchi bilat, no rales or consolidation; s/p VATS surg scar on right & mini thoracotomy scar on left... sl tender to palp left chest wall... HEART:  Regular Rhythm; without murmurs/ rubs/ or gallops heard... ABDOMEN:  Obese w/ panniculus, soft & nontender; normal bowel sounds; no organomegaly or masses detected. EXT:  S/P left TKR; mild arthritic changes; no varicose veins/ +venous insuffic/ tr edema. NEURO:  CN's intact;  no focal neuro deficits... sl tender over right max sinus. DERM:  No lesions noted; no rash etc...  RADIOLOGY DATA:  Reviewed in the EPIC EMR & discussed w/ the patient...  LABORATORY DATA:  Reviewed in the EPIC EMR & discussed w/ the patient...   Assessment & Plan:    COPD/ AB/ restriction from 2 surgeries>  She has severe airflow obstruction & superimposed restriction from her prev surg + obesity; Eval & Rx adjusted by DrKozlow but she has deteriorated;  she is confused about his recent changes but we don't have the recent note yet- rec to contact his office for clarification...   Hx Lung Cancer w/ 2 prev surgeries>  Followed regularly by  DrBurney (now retired), DrMohammed; last CT Chest 3/14 was stable...  HBP>  Controlled on Diovan/HCT, Verapamil, & Lasix;  We will adjust the Diovan & remove the HCT in light of the need to contnue the Lasix...  AFIB>  PAF episode treated by DrAllred w/ Eliquis & above meds, back in NSR...  HYPERLIPID>  She is INTOL to all statins, on diet/ exercise/ FishOil; recent FLP remains fair & she will attempt to improve diet & lose weight...  DM>  Control is worse w/ Metformin monotherapy & BS=227 w/ A1c up to 7.4 now;  Worsening numbers partly due to her consumption of Pred & her weight etc;  Advised to minimize steroid rx & adjust DM meds- Metform500Bid + Glimep2mg /d...  Multinod Thyroid>  She never did call DrBalan for f/u; repeat labs show that sh is still biochem euthyroid & she has seen DrGerkin...  Obesity>  Diet + exercise are the keys here...  GERD>  Prev MBS by speech path w/o asp or penetration; she needs to eat more slowly, chew well, swallow carefully; rec to continue Prilosec, elev HOB, & NPO after dinner... Nausea & GB eval/ EGD/ etc per DrGessner & his notes reviewed...  Mult Colon Polyps>  See 6/13 colonoscopy per DrGessner...  DJD/ Osteopenia>  On calcium, MVI, VitD; off Actonel & on drug holiday=> she will need f/u BMD...  Anxiety>  Prev on Xanax as needed, & doesn't think she needs a nerve pill; we discussed trial of KLONOPIN 0.5mg  bid to help her breathing...   Patient's Medications  New Prescriptions   GLIMEPIRIDE (AMARYL) 2 MG TABLET    Take 1 tablet (2 mg total) by mouth daily before breakfast.   VALSARTAN (DIOVAN) 160 MG TABLET    Take 1 tablet (160 mg total) by mouth daily.  Previous Medications   APIXABAN (ELIQUIS) 5 MG TABS TABLET    Take 5 mg by mouth daily.   AZITHROMYCIN (ZITHROMAX) 1 G POWDER    Take 1 packet by mouth once. 3 more weeks   BIOTIN 5000 MCG TABS    Take 1 tablet by mouth daily.   BUDESONIDE (PULMICORT) 0.5 MG/2ML NEBULIZER SOLUTION    Take 0.5 mg  by nebulization 2 (two) times daily.   COENZYME Q10 (COQ10) 200 MG CAPS    Take 1 capsule by mouth daily.   CYANOCOBALAMIN (VITAMIN B 12 PO)    Take by mouth. 1000 mcg once a day    DEXLANSOPRAZOLE (DEXILANT) 60 MG CAPSULE    Take 60 mg by mouth daily.   DEXTROMETHORPHAN-GUAIFENESIN (MUCINEX DM) 30-600 MG PER 12 HR TABLET    Take 1 tablet by mouth every 12 (twelve) hours.   FAMOTIDINE (PEPCID) 20 MG TABLET    Take 20 mg by mouth daily.   FENOFIBRATE (FENOGLIDE) 120 MG TABS    Take 1 tablet by mouth daily.   IPRATROPIUM (ATROVENT) 0.02 % NEBULIZER SOLUTION    Take 500 mcg by nebulization 2 (two) times daily. 0.5 mg by nebulizaiton 2 times daily albuterol sulfate 3 mg take 20 mcg by neb 2 times daily   MULTIPLE VITAMIN (MULTIVITAMIN PO)    Take by mouth. Once a day    NYSTATIN (MYCOSTATIN) 100000 UNIT/ML SUSPENSION    Take 5 mLs (500,000 Units total) by mouth as needed.  PROBIOTIC PRODUCT (ALIGN PO)    Take 1 tablet by mouth daily.   VERAPAMIL (VERELAN PM) 360 MG 24 HR CAPSULE    Take 1 capsule (360 mg total) by mouth at bedtime.   VITAMIN D, ERGOCALCIFEROL, (DRISDOL) 50000 UNITS CAPS    Take 1 capsule (50,000 Units total) by mouth every 7 (seven) days.  Modified Medications   Modified Medication Previous Medication   ALPRAZOLAM (XANAX) 0.25 MG TABLET ALPRAZolam (XANAX) 0.25 MG tablet      Take 1/2 to 1 tablet TID    Take 0.25 mg by mouth daily. Take 1/2 to 1 tablet qd PRN   FUROSEMIDE (LASIX) 20 MG TABLET furosemide (LASIX) 20 MG tablet      Take 1 tablet (20 mg total) by mouth daily.    Take 20 mg by mouth daily.    METFORMIN (GLUCOPHAGE) 500 MG TABLET metFORMIN (GLUCOPHAGE) 500 MG tablet      take 1 tablet by mouth two times daily    take 1 tablet by mouth every morning  Discontinued Medications   FORMOTEROL (PERFOROMIST) 20 MCG/2ML NEBULIZER SOLUTION    Take 20 mcg by nebulization 2 (two) times daily.   VALSARTAN-HYDROCHLOROTHIAZIDE (DIOVAN-HCT) 160-12.5 MG PER TABLET    TAKE 1 TABLET BY  MOUTH ONCE DAILY

## 2012-10-08 ENCOUNTER — Telehealth: Payer: Self-pay | Admitting: Pulmonary Disease

## 2012-10-08 MED ORDER — GLUCOSE BLOOD VI STRP: ORAL_STRIP | Status: DC

## 2012-10-08 MED ORDER — BAYER MICROLET LANCETS MISC: Status: DC

## 2012-10-08 NOTE — Telephone Encounter (Signed)
Per SN---  Non insulin dependant can only test once per day.   So #30 for the month.     rx has been sent to the pharmacy and this only comes in a package of #100.  Called and spoke with pt and she is aware.

## 2012-10-08 NOTE — Telephone Encounter (Signed)
I spoke with pt. She stated she has contour machine. She is needing lancets and test strips for this sent. She states she test BID. She stated this was giving ot her by a surgeon years ago. Please advise SN if okay to send for pt.  Last OV 09/30/12 Pending 11/04/12

## 2012-10-30 ENCOUNTER — Other Ambulatory Visit: Payer: Self-pay | Admitting: *Deleted

## 2012-11-04 ENCOUNTER — Ambulatory Visit: Payer: Medicare Other | Admitting: Pulmonary Disease

## 2012-11-05 ENCOUNTER — Ambulatory Visit (HOSPITAL_COMMUNITY): Payer: Medicare Other

## 2012-11-09 ENCOUNTER — Ambulatory Visit: Payer: Medicare Other | Admitting: Internal Medicine

## 2012-11-11 ENCOUNTER — Other Ambulatory Visit: Payer: Self-pay | Admitting: Internal Medicine

## 2012-11-12 ENCOUNTER — Encounter (HOSPITAL_COMMUNITY): Payer: Self-pay

## 2012-11-12 ENCOUNTER — Other Ambulatory Visit (HOSPITAL_COMMUNITY): Payer: Medicare Other

## 2012-11-12 ENCOUNTER — Ambulatory Visit (HOSPITAL_COMMUNITY)
Admission: RE | Admit: 2012-11-12 | Discharge: 2012-11-12 | Disposition: A | Discharge status: Home / Self Care | Payer: Medicare Other | Source: Ambulatory Visit | Attending: Internal Medicine | Admitting: Internal Medicine

## 2012-11-12 DIAGNOSIS — I2510 Atherosclerotic heart disease of native coronary artery without angina pectoris: Secondary | ICD-10-CM | POA: Insufficient documentation

## 2012-11-12 DIAGNOSIS — I771 Stricture of artery: Secondary | ICD-10-CM | POA: Insufficient documentation

## 2012-11-12 DIAGNOSIS — Z902 Acquired absence of lung [part of]: Secondary | ICD-10-CM | POA: Insufficient documentation

## 2012-11-12 DIAGNOSIS — I700 Atherosclerosis of aorta: Secondary | ICD-10-CM | POA: Insufficient documentation

## 2012-11-12 DIAGNOSIS — C3490 Malignant neoplasm of unspecified part of unspecified bronchus or lung: Secondary | ICD-10-CM | POA: Insufficient documentation

## 2012-11-12 DIAGNOSIS — Z8551 Personal history of malignant neoplasm of bladder: Secondary | ICD-10-CM | POA: Insufficient documentation

## 2012-11-12 DIAGNOSIS — K7689 Other specified diseases of liver: Secondary | ICD-10-CM | POA: Insufficient documentation

## 2012-11-12 LAB — POCT I-STAT, CHEM 8
BUN: 26 mg/dL — ABNORMAL HIGH (ref 6–23)
Calcium, Ion: 1.27 mmol/L (ref 1.13–1.30)
TCO2: 24 mmol/L (ref 0–100)

## 2012-11-12 MED ORDER — IOHEXOL 300 MG/ML  SOLN
80.0000 mL | Freq: Once | INTRAMUSCULAR | Status: AC | PRN
  Administered 2012-11-12: 80 mL via INTRAVENOUS

## 2012-11-17 ENCOUNTER — Ambulatory Visit (HOSPITAL_BASED_OUTPATIENT_CLINIC_OR_DEPARTMENT_OTHER): Payer: Medicare Other | Admitting: Internal Medicine

## 2012-11-17 ENCOUNTER — Ambulatory Visit: Payer: Medicare Other

## 2012-11-17 ENCOUNTER — Other Ambulatory Visit (HOSPITAL_BASED_OUTPATIENT_CLINIC_OR_DEPARTMENT_OTHER): Payer: Medicare Other | Admitting: Lab

## 2012-11-17 ENCOUNTER — Encounter: Payer: Self-pay | Admitting: Internal Medicine

## 2012-11-17 VITALS — BP 141/54 | HR 78 | Temp 98.4°F | Resp 20 | Ht 61.0 in | Wt 194.7 lb

## 2012-11-17 DIAGNOSIS — C3410 Malignant neoplasm of upper lobe, unspecified bronchus or lung: Secondary | ICD-10-CM

## 2012-11-17 DIAGNOSIS — C3490 Malignant neoplasm of unspecified part of unspecified bronchus or lung: Secondary | ICD-10-CM

## 2012-11-17 DIAGNOSIS — Z85118 Personal history of other malignant neoplasm of bronchus and lung: Secondary | ICD-10-CM

## 2012-11-17 LAB — CBC WITH DIFFERENTIAL/PLATELET
BASO%: 0.8 % (ref 0.0–2.0)
Eosinophils Absolute: 0.1 10*3/uL (ref 0.0–0.5)
HGB: 13.7 g/dL (ref 11.6–15.9)
LYMPH%: 20.4 % (ref 14.0–49.7)
MCHC: 33.2 g/dL (ref 31.5–36.0)
MONO#: 0.4 10*3/uL (ref 0.1–0.9)
MONO%: 4.6 % (ref 0.0–14.0)
Platelets: 293 10*3/uL (ref 145–400)
RBC: 4.2 10*6/uL (ref 3.70–5.45)
RDW: 13.1 % (ref 11.2–14.5)
WBC: 8.7 10*3/uL (ref 3.9–10.3)

## 2012-11-17 NOTE — Progress Notes (Signed)
[facility] [phone]   [phone]  OFFICE PROGRESS NOTE  [name] Mcalpine, MD [address] [place] [address]  PRINCIPAL DIAGNOSES:  1. 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 2. History of stage IA (T1, N0, MX) non-small cell lung cancer, adenocarcinoma with bronchoalveolar features diagnosed in November 2005 involving the right upper lobe.  PRIOR THERAPY:  1. Status post right upper lobectomy on January 03, 2004. 2. Status post wedge resection of the left upper lobe on February 06, 2009 under the care of Dr. Edwyna Shell.  CURRENT THERAPY: Observation.  INTERVAL HISTORY: [name] 78 y.o. female returns to the clinic today for routine six-month follow up visit. The patient continues to have increasing weakness and shortness of breath. Her oxygen saturation in the office today was 96% but she has a lot of expiratory wheezes. She is currently seen by Dr. Lucie Leather for evaluation of her dyspnea. She is on several inhalers. She also complains of fatigue. She denied having any significant weight loss or night sweats. She had repeat CT scan of the chest performed recently and she is here for evaluation and discussion of her scan results.  MEDICAL HISTORY: Past Medical History  Diagnosis Date  . Asthma   . COPD (chronic obstructive pulmonary disease)   . History of lung cancer   . Hypertension   . Paroxysmal atrial fibrillation   . Peripheral vascular disease   . Venous insufficiency   . Hyperlipidemia   . Diabetes mellitus   . Nontoxic multinodular goiter   . Obesity   . GERD (gastroesophageal reflux disease)   . History of nephrolithiasis   . History of bladder cancer   . DJD (degenerative joint disease)   . Osteopenia   . Anxiety   . History of colon polyps 07/233/2009    Tubular Adenomas  . Diverticulosis of sigmoid colon     mild  . Gastritis     moderate  . Hemorrhoids, internal   .  Claustrophobia   . Thyroid nodule   . Chronic anticoagulation     Eliquis    ALLERGIES:  is allergic to cephalexin; codeine; epinephrine; erythromycin; morphine; and sulfamethoxazole-trimethoprim.  MEDICATIONS:  Current Outpatient Prescriptions  Medication Sig Dispense Refill  . albuterol (PROVENTIL HFA;VENTOLIN HFA) 108 (90 BASE) MCG/ACT inhaler Inhale 2 puffs into the lungs every 6 (six) hours as needed for wheezing.      [name] ALPRAZolam (XANAX) 0.25 MG tablet Take 1/2 to 1 tablet TID  90 tablet  5  . BAYER MICROLET LANCETS lancets Use as instructed once daily  100 each  5  . Biotin 5000 MCG TABS Take 1 tablet by mouth daily.      . Coenzyme Q10 (COQ10) 200 MG CAPS Take 1 capsule by mouth daily.      . Cyanocobalamin (VITAMIN B 12 PO) Take by mouth. 1000 mcg once a day       . dexlansoprazole (DEXILANT) 60 MG capsule Take 60 mg by mouth daily.      [name] dextromethorphan-guaiFENesin (MUCINEX DM) 30-600 MG per 12 hr tablet Take 1 tablet by mouth every 12 (twelve) hours.      [name] ELIQUIS 5 MG TABS tablet take 1 tablet by mouth twice a day  60 tablet  6  . famotidine (PEPCID) 20 MG tablet Take 20 mg by mouth daily.      . Fenofibrate (FENOGLIDE) 120 MG TABS Take 1 tablet by  mouth daily.      . furosemide (LASIX) 20 MG tablet Take 1 tablet (20 mg total) by mouth daily.  30 tablet  6  . glimepiride (AMARYL) 2 MG tablet Take 1 tablet (2 mg total) by mouth daily before breakfast.  30 tablet  6  . glucose blood (BAYER CONTOUR TEST) test strip Use as instructed once daily  32 each  6  . metFORMIN (GLUCOPHAGE) 500 MG tablet take 1 tablet by mouth two times daily  60 tablet  6  . Multiple Vitamin (MULTIVITAMIN PO) Take by mouth. Once a day       . nystatin (MYCOSTATIN) 100000 UNIT/ML suspension Take 5 mLs (500,000 Units total) by mouth as needed.  60 mL    . Probiotic Product (ALIGN PO) Take 1 tablet by mouth daily.      [name] UNABLE TO FIND Oxygen 1.5 L nightly      . valsartan (DIOVAN) 160 MG tablet Take 1  tablet (160 mg total) by mouth daily.  30 tablet  6  . verapamil (VERELAN PM) 360 MG 24 hr capsule Take 1 capsule (360 mg total) by mouth at bedtime.  30 capsule  6  . Vitamin D, Ergocalciferol, (DRISDOL) 50000 UNITS CAPS Take 1 capsule (50,000 Units total) by mouth every 7 (seven) days.  4 capsule  12   No current facility-administered medications for this visit.    SURGICAL HISTORY:  Past Surgical History  Procedure Laterality Date  . S/p right rotator cuff repair  1991    Dr. Meade Maw, right  . S/p turbt  10/01    Dr. Vonita Moss  . S/p left tkr  2002    Dr. Darrelyn Hillock  . 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 S/p d&c for removal of endometrial polyp (benign)  12/10    GYN  . Colonoscopy    . Esophagogastroduodenoscopy      REVIEW OF SYSTEMS:  Constitutional: positive for fatigue Eyes: negative Ears, nose, mouth, throat, and face: negative Respiratory: positive for dyspnea on exertion and wheezing Cardiovascular: negative Gastrointestinal: negative Genitourinary:negative Integument/breast: negative Hematologic/lymphatic: negative Musculoskeletal:negative Neurological: negative Behavioral/Psych: negative Endocrine: negative Allergic/Immunologic: negative   PHYSICAL EXAMINATION: General appearance: alert, cooperative, fatigued and no distress Head: Normocephalic, without obvious abnormality, atraumatic Neck: no adenopathy, no JVD, supple, symmetrical, trachea midline and thyroid not enlarged, symmetric, no tenderness/mass/nodules Lymph nodes: Cervical, supraclavicular, and axillary nodes normal. Resp: wheezes bilaterally Back: symmetric, no curvature. ROM normal. No CVA tenderness. Cardio: regular rate and rhythm, S1, S2 normal, no murmur, click, rub or gallop GI: soft, non-tender; bowel sounds normal; no masses,   no organomegaly Extremities: extremities normal, atraumatic, no cyanosis or edema  ECOG PERFORMANCE STATUS: 1 - Symptomatic but completely ambulatory  Blood pressure 141/54, pulse 78, temperature 98.4 F (36.9 C), temperature source Oral, resp. rate 20, height 5\' 1"  (1.549 m), weight 194 lb 11.2 oz (88.315 kg), SpO2 97.00%.  LABORATORY DATA: Lab Results  Component Value Date   WBC 8.7 11/17/2012   HGB 13.7 11/17/2012   HCT 41.4 11/17/2012   MCV 98.5 11/17/2012   PLT 293 11/17/2012      Chemistry      Component Value Date/Time   NA 143 11/12/2012 1206   NA 142 05/04/2012 1042   K 4.3 11/12/2012 1206   K  4.5 05/04/2012 1042   CL 105 11/12/2012 1206   CL 105 05/04/2012 1042   CO2 30 09/30/2012 1237   CO2 27 05/04/2012 1042   BUN 26* 11/12/2012 1206   BUN 22.8 05/04/2012 1042   CREATININE 1.50* 11/12/2012 1206   CREATININE 0.9 05/04/2012 1042      Component Value Date/Time   CALCIUM 10.9* 09/30/2012 1237   CALCIUM 10.3 05/04/2012 1042   ALKPHOS 44 05/04/2012 1042   ALKPHOS 35* 04/29/2012 0820   AST 25 05/04/2012 1042   AST 19 04/29/2012 0820   ALT 32 05/04/2012 1042   ALT 28 04/29/2012 0820   BILITOT 0.51 05/04/2012 1042   BILITOT 0.6 04/29/2012 0820       RADIOGRAPHIC STUDIES: Ct Chest W Contrast  11/12/2012   CLINICAL DATA:  History of lung cancer diagnosed in 2005 and again in 2010. Status post bilateral upper lobectomies. History of bladder cancer diagnosed in 2001.  EXAM: CT CHEST WITH CONTRAST  TECHNIQUE: Multidetector CT imaging of the chest was performed during intravenous contrast administration.  CONTRAST:  80mL OMNIPAQUE IOHEXOL 300 MG/ML  SOLN  COMPARISON:  Chest CT 05/04/2012.  FINDINGS: Mediastinum: Heart size is normal. There is no significant pericardial fluid, thickening or pericardial calcification. There is atherosclerosis of the thoracic aorta, the great vessels of the mediastinum and the coronary arteries, including calcified atherosclerotic plaque in the left anterior  descending, left circumflex and right coronary arteries. In addition, there is severe stenosis at the origin of the left subclavian artery. No pathologically enlarged mediastinal or hilar lymph nodes. Esophagus is unremarkable in appearance. Heterogeneity in the thyroid gland, likely reflective of a mild goiter.  Lungs/Pleura: Status post bilateral upper lobectomies. Compensatory hyperexpansion of the remaining portions of the lungs. No suspicious appearing pulmonary nodules or masses are identified. No acute consolidative airspace disease. No pleural effusions. Mild pleural parenchymal scarring in the periphery of the lungs bilaterally, similar to prior studies.  Upper Abdomen: Diffuse low attenuation throughout the hepatic parenchyma, suggestive of hepatic steatosis. Extensive atherosclerosis.  Musculoskeletal: There are no aggressive appearing lytic or blastic lesions noted in the visualized portions of the skeleton.  IMPRESSION: 1. Status post bilateral upper lobectomies without evidence to suggest local recurrence of disease or new metastatic disease in the thorax on today's examination. 2. Atherosclerosis, including three-vessel coronary artery disease. In addition, there is severe stenosis of the origin of the left remain artery. 3. Hepatic steatosis. 4. Additional incidental findings, as above.   Electronically Signed   By: Trudie Reed M.D.   On: 11/12/2012 14:58    ASSESSMENT AND PLAN: this is a very pleasant 78 years old white female with history of multifocal adenocarcinoma status post resection of the right upper lobe as well as wedge resection of the left upper lobe and has been observation with no evidence for disease recurrence on the recent scan. I discussed the scan results with the patient today. I recommended for her to continue on observation with repeat CT scan of the chest in 6 months. For the dyspnea and expiratory wheezes, I offered the patient albuterol nebulizer in the clinic  today but she was able to get an appointment today with Dr. Lucie Leather immediately after her visit with me and she would like him to see her with her current symptoms. She declined the nebulizer in the clinic and went to see Dr. Lucie Leather.  I would see the patient back for follow up visit in 6 months. She was advised to call immediately  if she has any other concerning symptoms in the interval.  The patient voices understanding of current disease status and treatment options and is in agreement with the current care plan.  All questions were answered. The patient knows to call the clinic with any problems, questions or concerns. We can certainly see the patient much sooner if necessary.  I spent 15 minutes counseling the patient face to face. The total time spent in the appointment was 25 minutes.

## 2012-11-17 NOTE — Patient Instructions (Signed)
Followup visit in 6 months with repeat CT scan of the chest. 

## 2012-11-18 ENCOUNTER — Telehealth: Payer: Self-pay | Admitting: *Deleted

## 2012-11-18 NOTE — Telephone Encounter (Signed)
sw pt gv appt for 05/24/13 w/labs@10 :45am and ov@ 11:15am. Pt is aware that cs will call with CT appt...td

## 2012-12-28 ENCOUNTER — Other Ambulatory Visit: Payer: Self-pay | Admitting: Pulmonary Disease

## 2013-01-06 ENCOUNTER — Telehealth: Payer: Self-pay | Admitting: Pulmonary Disease

## 2013-01-06 ENCOUNTER — Telehealth: Payer: Self-pay | Admitting: Medical Oncology

## 2013-01-06 NOTE — Telephone Encounter (Signed)
Spoke with the pt  She wants to let SN know that Dr. Lucie Leather is referring her to [facility] and she will be seeing Dr. Robb Matar Will forward to [facility] as [facility]

## 2013-01-06 NOTE — Telephone Encounter (Signed)
Returned call . Gave pt information and transferred call to radiology .

## 2013-01-07 NOTE — Telephone Encounter (Signed)
SN is aware. 

## 2013-01-14 ENCOUNTER — Encounter: Payer: Self-pay | Admitting: Nurse Practitioner

## 2013-01-14 ENCOUNTER — Telehealth: Payer: Self-pay | Admitting: Internal Medicine

## 2013-01-14 NOTE — Telephone Encounter (Signed)
Pt calls today b/c she was at [facility] for pulmonary workup. An ekg was performed & pt found to be in atrial fib with HR ?110. States BP was "fine".  States doctor told her she needed to see her cardiologist. Pt did not know she was out of rhythm & asymptomatic.  Pt states she is taking her Eliquis & Verapamil as directed. Appt. Made for pt with Norma Fredrickson tomorrow. Mylo Red RN

## 2013-01-14 NOTE — Telephone Encounter (Signed)
New problem   Pt stated she may be in afib and need to speak to nurse concerning this matter.

## 2013-01-15 ENCOUNTER — Ambulatory Visit (INDEPENDENT_AMBULATORY_CARE_PROVIDER_SITE_OTHER): Payer: Medicare Other | Admitting: Nurse Practitioner

## 2013-01-15 ENCOUNTER — Encounter (HOSPITAL_COMMUNITY): Payer: Self-pay | Admitting: Cardiology

## 2013-01-15 ENCOUNTER — Inpatient Hospital Stay (HOSPITAL_COMMUNITY)
Admission: AD | Admit: 2013-01-15 | Discharge: 2013-01-21 | DRG: 309 | Disposition: A | Discharge status: Home / Self Care | Payer: Medicare Other | Source: Ambulatory Visit | Attending: Internal Medicine | Admitting: Internal Medicine

## 2013-01-15 ENCOUNTER — Observation Stay (HOSPITAL_COMMUNITY): Payer: Medicare Other

## 2013-01-15 ENCOUNTER — Encounter: Payer: Self-pay | Admitting: Nurse Practitioner

## 2013-01-15 VITALS — BP 132/76 | HR 102 | Ht 60.0 in | Wt 196.0 lb

## 2013-01-15 DIAGNOSIS — C679 Malignant neoplasm of bladder, unspecified: Secondary | ICD-10-CM

## 2013-01-15 DIAGNOSIS — J449 Chronic obstructive pulmonary disease, unspecified: Secondary | ICD-10-CM

## 2013-01-15 DIAGNOSIS — Z79899 Other long term (current) drug therapy: Secondary | ICD-10-CM

## 2013-01-15 DIAGNOSIS — C3490 Malignant neoplasm of unspecified part of unspecified bronchus or lung: Secondary | ICD-10-CM

## 2013-01-15 DIAGNOSIS — M545 Low back pain, unspecified: Secondary | ICD-10-CM

## 2013-01-15 DIAGNOSIS — R110 Nausea: Secondary | ICD-10-CM

## 2013-01-15 DIAGNOSIS — Z87891 Personal history of nicotine dependence: Secondary | ICD-10-CM

## 2013-01-15 DIAGNOSIS — K317 Polyp of stomach and duodenum: Secondary | ICD-10-CM

## 2013-01-15 DIAGNOSIS — Z96659 Presence of unspecified artificial knee joint: Secondary | ICD-10-CM

## 2013-01-15 DIAGNOSIS — K219 Gastro-esophageal reflux disease without esophagitis: Secondary | ICD-10-CM

## 2013-01-15 DIAGNOSIS — J4489 Other specified chronic obstructive pulmonary disease: Secondary | ICD-10-CM

## 2013-01-15 DIAGNOSIS — I4891 Unspecified atrial fibrillation: Secondary | ICD-10-CM

## 2013-01-15 DIAGNOSIS — F411 Generalized anxiety disorder: Secondary | ICD-10-CM

## 2013-01-15 DIAGNOSIS — Z87442 Personal history of urinary calculi: Secondary | ICD-10-CM

## 2013-01-15 DIAGNOSIS — J45909 Unspecified asthma, uncomplicated: Secondary | ICD-10-CM

## 2013-01-15 DIAGNOSIS — Z85118 Personal history of other malignant neoplasm of bronchus and lung: Secondary | ICD-10-CM

## 2013-01-15 DIAGNOSIS — E785 Hyperlipidemia, unspecified: Secondary | ICD-10-CM

## 2013-01-15 DIAGNOSIS — E042 Nontoxic multinodular goiter: Secondary | ICD-10-CM

## 2013-01-15 DIAGNOSIS — I739 Peripheral vascular disease, unspecified: Secondary | ICD-10-CM

## 2013-01-15 DIAGNOSIS — J984 Other disorders of lung: Secondary | ICD-10-CM | POA: Diagnosis present

## 2013-01-15 DIAGNOSIS — Z9981 Dependence on supplemental oxygen: Secondary | ICD-10-CM

## 2013-01-15 DIAGNOSIS — Z6841 Body Mass Index (BMI) 40.0 and over, adult: Secondary | ICD-10-CM

## 2013-01-15 DIAGNOSIS — K7689 Other specified diseases of liver: Secondary | ICD-10-CM | POA: Diagnosis present

## 2013-01-15 DIAGNOSIS — I872 Venous insufficiency (chronic) (peripheral): Secondary | ICD-10-CM

## 2013-01-15 DIAGNOSIS — M1990 Unspecified osteoarthritis, unspecified site: Secondary | ICD-10-CM

## 2013-01-15 DIAGNOSIS — I10 Essential (primary) hypertension: Secondary | ICD-10-CM

## 2013-01-15 DIAGNOSIS — M899 Disorder of bone, unspecified: Secondary | ICD-10-CM

## 2013-01-15 DIAGNOSIS — E119 Type 2 diabetes mellitus without complications: Secondary | ICD-10-CM

## 2013-01-15 DIAGNOSIS — Z8551 Personal history of malignant neoplasm of bladder: Secondary | ICD-10-CM

## 2013-01-15 DIAGNOSIS — Z860101 Personal history of adenomatous and serrated colon polyps: Secondary | ICD-10-CM

## 2013-01-15 DIAGNOSIS — Z7901 Long term (current) use of anticoagulants: Secondary | ICD-10-CM

## 2013-01-15 DIAGNOSIS — E669 Obesity, unspecified: Secondary | ICD-10-CM

## 2013-01-15 DIAGNOSIS — Z86010 Personal history of colonic polyps: Secondary | ICD-10-CM

## 2013-01-15 DIAGNOSIS — K5520 Angiodysplasia of colon without hemorrhage: Secondary | ICD-10-CM

## 2013-01-15 DIAGNOSIS — R0602 Shortness of breath: Secondary | ICD-10-CM

## 2013-01-15 LAB — CBC WITH DIFFERENTIAL/PLATELET
Basophils Absolute: 0 10*3/uL (ref 0.0–0.1)
Basophils Relative: 0 % (ref 0–1)
Eosinophils Absolute: 0 10*3/uL (ref 0.0–0.7)
Eosinophils Relative: 0 % (ref 0–5)
HCT: 40.4 % (ref 36.0–46.0)
Hemoglobin: 13 g/dL (ref 12.0–15.0)
Lymphocytes Relative: 4 % — ABNORMAL LOW (ref 12–46)
Lymphs Abs: 0.6 10*3/uL — ABNORMAL LOW (ref 0.7–4.0)
MCH: 32.2 pg (ref 26.0–34.0)
MCHC: 32.2 g/dL (ref 30.0–36.0)
MCV: 100 fL (ref 78.0–100.0)
Monocytes Absolute: 0.5 10*3/uL (ref 0.1–1.0)
Monocytes Relative: 4 % (ref 3–12)
Neutro Abs: 12.1 10*3/uL — ABNORMAL HIGH (ref 1.7–7.7)
Neutrophils Relative %: 92 % — ABNORMAL HIGH (ref 43–77)
Platelets: 261 10*3/uL (ref 150–400)
RBC: 4.04 MIL/uL (ref 3.87–5.11)
RDW: 14.8 % (ref 11.5–15.5)
WBC: 13.2 10*3/uL — ABNORMAL HIGH (ref 4.0–10.5)

## 2013-01-15 LAB — BASIC METABOLIC PANEL
BUN: 34 mg/dL — ABNORMAL HIGH (ref 6–23)
CO2: 25 mEq/L (ref 19–32)
Calcium: 9.9 mg/dL (ref 8.4–10.5)
Chloride: 102 mEq/L (ref 96–112)
Creatinine, Ser: 0.82 mg/dL (ref 0.50–1.10)
GFR calc Af Amer: 79 mL/min — ABNORMAL LOW (ref >90)
GFR calc non Af Amer: 68 mL/min — ABNORMAL LOW (ref >90)
Glucose, Bld: 158 mg/dL — ABNORMAL HIGH (ref 70–99)
Potassium: 5.7 mEq/L — ABNORMAL HIGH (ref 3.5–5.1)
Sodium: 139 mEq/L (ref 135–145)

## 2013-01-15 LAB — APTT: aPTT: 32 seconds (ref 24–37)

## 2013-01-15 LAB — PRO B NATRIURETIC PEPTIDE: Pro B Natriuretic peptide (BNP): 253.6 pg/mL (ref 0–450)

## 2013-01-15 LAB — TROPONIN I: Troponin I: <0.3 ng/mL (ref <0.30)

## 2013-01-15 LAB — PROTIME-INR
INR: 1.36 (ref 0.00–1.49)
Prothrombin Time: 16.4 seconds — ABNORMAL HIGH (ref 11.6–15.2)

## 2013-01-15 LAB — MAGNESIUM: Magnesium: 2 mg/dL (ref 1.5–2.5)

## 2013-01-15 MED ORDER — APIXABAN 5 MG PO TABS
5.0000 mg | ORAL_TABLET | Freq: Two times a day (BID) | ORAL | Status: DC
  Administered 2013-01-15 – 2013-01-21 (×13): 5 mg via ORAL
  Filled 2013-01-15 (×15): qty 1

## 2013-01-15 MED ORDER — FAMOTIDINE 40 MG/5ML PO SUSR
20.0000 mg | Freq: Every day | ORAL | Status: DC
  Administered 2013-01-15 – 2013-01-21 (×7): 20 mg via ORAL
  Filled 2013-01-15 (×7): qty 2.5

## 2013-01-15 MED ORDER — PANTOPRAZOLE SODIUM 40 MG PO TBEC
40.0000 mg | DELAYED_RELEASE_TABLET | Freq: Every day | ORAL | Status: DC
  Administered 2013-01-15 – 2013-01-21 (×7): 40 mg via ORAL
  Filled 2013-01-15 (×5): qty 1

## 2013-01-15 MED ORDER — BUDESONIDE 0.5 MG/2ML IN SUSP
0.5000 mg | Freq: Two times a day (BID) | RESPIRATORY_TRACT | Status: DC
  Administered 2013-01-15 – 2013-01-21 (×12): 0.5 mg via RESPIRATORY_TRACT
  Filled 2013-01-15 (×14): qty 2

## 2013-01-15 MED ORDER — SODIUM CHLORIDE 0.9 % IV SOLN
INTRAVENOUS | Status: DC
  Administered 2013-01-15: 10 mL/h via INTRAVENOUS
  Administered 2013-01-18: 10 mL via INTRAVENOUS

## 2013-01-15 MED ORDER — METFORMIN HCL 500 MG PO TABS
500.0000 mg | ORAL_TABLET | Freq: Two times a day (BID) | ORAL | Status: DC
  Administered 2013-01-15 – 2013-01-21 (×12): 500 mg via ORAL
  Filled 2013-01-15 (×15): qty 1

## 2013-01-15 MED ORDER — DILTIAZEM LOAD VIA INFUSION
10.0000 mg | Freq: Once | INTRAVENOUS | Status: AC
  Administered 2013-01-15: 10 mg via INTRAVENOUS
  Filled 2013-01-15: qty 10

## 2013-01-15 MED ORDER — ONDANSETRON HCL 4 MG/2ML IJ SOLN: 4.0000 mg | Freq: Four times a day (QID) | INTRAMUSCULAR | Status: DC | PRN

## 2013-01-15 MED ORDER — ALBUTEROL SULFATE HFA 108 (90 BASE) MCG/ACT IN AERS
2.0000 | INHALATION_SPRAY | Freq: Four times a day (QID) | RESPIRATORY_TRACT | Status: DC | PRN
  Filled 2013-01-15: qty 6.7

## 2013-01-15 MED ORDER — ACETAMINOPHEN 325 MG PO TABS: 650.0000 mg | ORAL_TABLET | ORAL | Status: DC | PRN

## 2013-01-15 MED ORDER — DILTIAZEM HCL 100 MG IV SOLR
5.0000 mg/h | INTRAVENOUS | Status: DC
  Administered 2013-01-15: 5 mg/h via INTRAVENOUS
  Administered 2013-01-16 (×2): 10 mg/h via INTRAVENOUS
  Filled 2013-01-15 (×2): qty 100

## 2013-01-15 MED ORDER — GLIMEPIRIDE 2 MG PO TABS
2.0000 mg | ORAL_TABLET | Freq: Every day | ORAL | Status: DC
  Administered 2013-01-16 – 2013-01-21 (×6): 2 mg via ORAL
  Filled 2013-01-15 (×8): qty 1

## 2013-01-15 MED ORDER — FUROSEMIDE 40 MG PO TABS
40.0000 mg | ORAL_TABLET | Freq: Every day | ORAL | Status: DC
  Administered 2013-01-15 – 2013-01-21 (×7): 40 mg via ORAL
  Filled 2013-01-15 (×7): qty 1

## 2013-01-15 MED ORDER — ALPRAZOLAM 0.25 MG PO TABS
0.2500 mg | ORAL_TABLET | Freq: Three times a day (TID) | ORAL | Status: DC | PRN
  Administered 2013-01-16: 0.25 mg via ORAL
  Filled 2013-01-15: qty 1

## 2013-01-15 MED ORDER — IRBESARTAN 300 MG PO TABS
300.0000 mg | ORAL_TABLET | Freq: Every day | ORAL | Status: DC
  Administered 2013-01-15 – 2013-01-21 (×7): 300 mg via ORAL
  Filled 2013-01-15 (×7): qty 1

## 2013-01-15 NOTE — H&P (Signed)
[name]  01/15/2013 2:30 PM   Office Visit  MRN:  [id number]   Description: 78 year old female  Provider: Rosalio Macadamia, NP  Department: [facility]        Referring Provider    Michele Mcalpine, MD      Diagnoses    Atrial fibrillation    -  Primary    427.31      Reason for Visit    Atrial Fibrillation    Work in visit. Seen for Dr. Johney Frame.         Progress Notes    Rosalio Macadamia, NP at 01/15/2013  2:20 PM    Status: Signed               [name] Date of Birth: 05-Mar-1934 Medical Record #[id number]   History of Present Illness: [name] is seen back today for a work in visit. Seen for Dr. Johney Frame. She has PAF with a CHADs of 3 (age, HTN, DM). She has had good LV function. Other issues include moderate hypertrophy, mild LAE and MR. Has had issues with chronic nausea with negative evaluation as well as chronic DOB with prior lung surgery (bilateral lobectomies) and known lung disease/cancer. She has had extensive atherosclerosis noted on past CT scans. Normal Myoview in October of 2013 and last echo was in August of 2014.    Last seen here in July by Dr. Johney Frame. Was doing ok.   Was at [facility] for a pulmonary workup yesterday - EKG showed atrial fib with a rate reported at 110 - she was told to be seen here. Thus added to my schedule today.   Comes in today. Here alone. Husband drove her here today. She is so much more short of breath. Has had recent flare up - has already had 2 rounds of steroids. Seen by pulmonary yesterday and started another round of medrol and given an injection. Noted to be in atrial fib - she is totally unaware. Coughing. No syncope. No chest pain.    Current Outpatient Prescriptions   Medication  Sig  Dispense  Refill   .  albuterol (PROVENTIL HFA;VENTOLIN HFA) 108 (90 BASE) MCG/ACT inhaler  Inhale 2 puffs into the lungs every 6 (six) hours as needed for wheezing.         [name]  ALPRAZolam (XANAX) 0.25 MG tablet  Take 1/2  to 1 tablet TID   90 tablet   5   .  BAYER MICROLET LANCETS lancets  Use as instructed once daily   100 each   5   .  Biotin 5000 MCG TABS  Take 1 tablet by mouth daily.         .  budesonide (PULMICORT) 0.5 MG/2ML nebulizer solution  Take 0.5 mg by nebulization 2 (two) times daily. Takes along with pulmo mist         .  Coenzyme Q10 (COQ10) 200 MG CAPS  Take 1 capsule by mouth daily.         .  Cyanocobalamin (VITAMIN B 12 PO)  Take by mouth. 1000 mcg once a day          .  dexlansoprazole (DEXILANT) 60 MG capsule  Take 60 mg by mouth daily.         [name]  dextromethorphan-guaiFENesin (MUCINEX DM) 30-600 MG per 12 hr tablet  Take 1 tablet by mouth every 12 (twelve) hours.         [name]  ELIQUIS 5 MG TABS tablet  take 1 tablet by mouth twice a day   60 tablet   6   .  famotidine (PEPCID) 20 MG tablet  Take 20 mg by mouth daily.         .  Fenofibrate (FENOGLIDE) 120 MG TABS  Take 1 tablet by mouth daily.         [name]  FENOGLIDE 120 MG TABS  take 1 tablet by mouth once daily   30 tablet   6   .  furosemide (LASIX) 20 MG tablet  Take 1 tablet (20 mg total) by mouth daily.   30 tablet   6   .  glimepiride (AMARYL) 2 MG tablet  Take 1 tablet (2 mg total) by mouth daily before breakfast.   30 tablet   6   .  glucose blood (BAYER CONTOUR TEST) test strip  Use as instructed once daily   32 each   6   .  metFORMIN (GLUCOPHAGE) 500 MG tablet  take 1 tablet by mouth two times daily   60 tablet   6   .  Multiple Vitamin (MULTIVITAMIN PO)  Take by mouth. Once a day          .  nystatin (MYCOSTATIN) 100000 UNIT/ML suspension  Take 5 mLs (500,000 Units total) by mouth as needed.   60 mL      .  Probiotic Product (ALIGN PO)  Take 1 tablet by mouth daily.         [name]  UNABLE TO FIND  Oxygen 1.5 L nightly         .  valsartan (DIOVAN) 160 MG tablet  Take 1 tablet (160 mg total) by mouth daily.   30 tablet   6   .  verapamil (VERELAN PM) 360 MG 24 hr capsule  Take 1 capsule (360 mg total) by mouth at bedtime.   30 capsule   6    .  Vitamin D, Ergocalciferol, (DRISDOL) 50000 UNITS CAPS  Take 1 capsule (50,000 Units total) by mouth every 7 (seven) days.   4 capsule   12       No current facility-administered medications for this visit.         Allergies   Allergen  Reactions   .  Cephalexin         REACTION: nausea and diarrhea   .  Codeine         REACTION: nausea   .  Epinephrine         REACTION: nervous, convulsions   .  Erythromycin         REACTION: all mycins cause yeast infections   .  Morphine         REACTION: nausea   .  Sulfamethoxazole-Trimethoprim         REACTION: nausea and diarrhea         Past Medical History   Diagnosis  Date   .  Asthma     .  COPD (chronic obstructive pulmonary disease)     .  History of lung cancer     .  Hypertension     .  Paroxysmal atrial fibrillation     .  Peripheral vascular disease     .  Venous insufficiency     .  Hyperlipidemia     .  Diabetes mellitus     .  Nontoxic multinodular goiter     .  Obesity     .  GERD (gastroesophageal reflux disease)     .  History of nephrolithiasis     .  History of bladder cancer     .  DJD (degenerative joint disease)     .  Osteopenia     .  Anxiety     .  History of colon polyps  07/233/2009       Tubular Adenomas   .  Diverticulosis of sigmoid colon         mild   .  Gastritis         moderate   .  Hemorrhoids, internal     .  Claustrophobia     .  Thyroid nodule     .  Chronic anticoagulation         Eliquis         Past Surgical History   Procedure  Laterality  Date   .  S/p right rotator cuff repair    1991       Dr. Meade Maw, right   .  S/p turbt    10/01       Dr. Vonita Moss   .  S/p left tkr    2002       Dr. Darrelyn Hillock   .  S/p right upper lobectomy for lung cancer    11/05       Dr. Edwyna Shell (bronchoalveolar cell ca)   .  S/p left upper lobectomy and node dissection 1/11    1/11       Dr. Edwyna Shell (multicentric bronchoalveolar cell ca)   .  S/p d&c for removal of endometrial polyp  (benign)    12/10       GYN   .  Colonoscopy       .  Esophagogastroduodenoscopy             History   Smoking status   .  Former Smoker -- 0.50 packs/day for 39 years   .  Types:  Cigarettes   .  Quit date:  02/04/1994   Smokeless tobacco   .  Never Used         History   Alcohol Use   .  Yes       Comment: Occasionally         Family History   Problem  Relation  Age of Onset   .  Asthma  Mother     .  Heart failure  Mother     .  Heart attack  Father     .  Ulcers  Father     .  Colon cancer  Neg Hx          Review of Systems: The review of systems is per the HPI.  All other systems were reviewed and are negative.   Physical Exam: BP 132/76  Pulse 102  Ht 5' (1.524 m)  Wt 196 lb (88.905 kg)  BMI 38.28 kg/m2 Oxygen sat is 90 to 95%. Patient is very pleasant and in mild to moderate respiratory distress. She is obese. Weight is up 2 pounds. Skin is warm and dry. Color is normal.  HEENT is unremarkable. Normocephalic/atraumatic. PERRL. Sclera are nonicteric. Neck is supple. No masses. No JVD. Lungs are extremely tight with diffuse wheezing. She has audible wheezing in the exam room - almost having stridor.  Cardiac exam shows an irregular rate and rhythm. Rate is about 120 by my exam. Abdomen is obese but soft. Extremities are without significant edema. Gait and  ROM are intact. No gross neurologic deficits noted.    Wt Readings from Last 3 Encounters:   01/15/13  196 lb (88.905 kg)   11/17/12  194 lb 11.2 oz (88.315 kg)   09/30/12  200 lb 6.4 oz (90.901 kg)      LABORATORY DATA: EKG from yesterday with atrial fib with RVR    Lab Results   Component  Value  Date     WBC  8.7  11/17/2012     HGB  13.7  11/17/2012     HCT  41.4  11/17/2012     PLT  293  11/17/2012     GLUCOSE  170*  11/12/2012     CHOL  196  04/29/2012     TRIG  84.0  04/29/2012     HDL  46.10  04/29/2012     LDLDIRECT  158.9  04/24/2011     LDLCALC  133*  04/29/2012     ALT  32   05/04/2012     AST  25  05/04/2012     NA  143  11/12/2012     K  4.3  11/12/2012     CL  105  11/12/2012     CREATININE  1.50*  11/12/2012     BUN  26*  11/12/2012     CO2  30  09/30/2012     TSH  0.98  04/29/2012     INR  0.94  10/02/2010     HGBA1C  7.4*  09/30/2012      Echo Study Conclusions from August 2014  Left ventricle: The cavity size was normal. Wall thickness was increased in a pattern of moderate LVH. Systolic function was normal. The estimated ejection fraction was in the range of 55% to 60%. Doppler parameters are consistent with abnormal left ventricular relaxation (grade 1 diastolic dysfunction).   Myoview Impression from October 2013 Exercise Capacity: Lexiscan with no exercise.   BP Response: Normal blood pressure response.   Clinical Symptoms: There is dyspnea.   ECG Impression: No significant ST segment change suggestive of ischemia.   Comparison with Prior Nuclear Study: No images to compare   Overall Impression: Normal stress nuclear study.   LV Ejection Fraction: 74%. LV Wall Motion: NL LV Function; NL Wall Motion   Cassell Clement    CT CHEST FINDINGS FROM 11/2012: Mediastinum: Heart size is normal. There is no significant pericardial fluid, thickening or pericardial calcification. There is atherosclerosis of the thoracic aorta, the great vessels of the mediastinum and the coronary arteries, including calcified atherosclerotic plaque in the left anterior descending, left circumflex and right coronary arteries. In addition, there is severe stenosis at the origin of the left subclavian artery. No pathologically enlarged mediastinal or hilar lymph nodes. Esophagus is unremarkable in appearance. Heterogeneity in the thyroid gland, likely reflective of a mild goiter.  Lungs/Pleura: Status post bilateral upper lobectomies. Compensatory hyperexpansion of the remaining portions of the lungs. No suspicious appearing pulmonary nodules or masses are identified. No  acute consolidative airspace disease. No pleural effusions. Mild pleural parenchymal scarring in the periphery of the lungs bilaterally, similar to prior studies.  Upper Abdomen: Diffuse low attenuation throughout the hepatic parenchyma, suggestive of hepatic steatosis. Extensive atherosclerosis.  Musculoskeletal: There are no aggressive appearing lytic or blastic lesions noted in the visualized portions of the skeleton.  IMPRESSION: 1. Status post bilateral upper lobectomies without evidence to suggest local recurrence of disease or new metastatic disease in the thorax on today's  examination. 2. Atherosclerosis, including three-vessel coronary artery disease. In addition, there is severe stenosis of the origin of the left remain artery. 3. Hepatic steatosis. 4. Additional incidental findings, as above.   Electronically Signed By: Trudie Reed M.D. On: 11/12/2012 14:58     Assessment / Plan:   1. PAF - back in atrial fib with RVR - with worsening dyspnea/respiratory failure - exacerbated by her underlying lung issues. Need to slow her rate down - will place on IV diltiazem. She has been on her Eliquis and says she has had no missed doses. Have discussed her care with Dr. Myrtis Ser (DOD) - we are both in agreement that she needs to be admitted for rate control and stabilization. Do not feel it is safe to send her back home - husband is in agreement as well. May need cardioversion as well. Has had fairly recent echo - I have not reordered. Step down bed requested. Will increase her Lasix. Check labs and start IV diltiazem.    2. Chronic SOB - has had prior lung cancer, surgery, etc. - her breathing quiets down with resting but HR remains quite elevated.    3. HTN    4. Chronic anticoagulation   5. Extensive atherosclerosis noted on last CT scan - negative Myoview in the past.    Patient is agreeable to this plan and will call if any problems develop in the interim.     Rosalio Macadamia, RN, ANP-C [facility] [address] [place], [place]  [address]     I agree with the assessment and plan as detailed above. The patient has marked shortness of breath.  We need to control the patient's atrial fibrillation rate. She was in sinus rhythm when seen in the office by Dr. Johney Frame in July, 2014. After her x-ray and lab results are back, decision can be made if it seems prudent to proceed with cardioversion at some point.   Willa Rough, MD, [facility] 01/15/2013 5:12 PM

## 2013-01-15 NOTE — Progress Notes (Signed)
[name] Date of Birth: 1934-11-10 Medical Record #[id number]  History of Present Illness: Ms. Wittmeyer is seen back today for a work in visit. Seen for Dr. Johney Frame. She has PAF with a CHADs of 3 (age, HTN, DM). She has had good LV function. Other issues include moderate hypertrophy, mild LAE and MR. Has had issues with chronic nausea with negative evaluation as well as chronic DOB with prior lung surgery (bilateral lobectomies) and known lung disease/cancer. She has had extensive atherosclerosis noted on past CT scans. Normal Myoview in October of 2013 and last echo was in August of 2014.   Last seen here in July by Dr. Johney Frame. Was doing ok.  Was at [facility] for a pulmonary workup yesterday - EKG showed atrial fib with a rate reported at 110 - she was told to be seen here. Thus added to my schedule today.  Comes in today. Here alone. Husband drove her here today. She is so much more short of breath. Has had recent flare up - has already had 2 rounds of steroids. Seen by pulmonary yesterday and started another round of medrol and given an injection. Noted to be in atrial fib - she is totally unaware. Coughing. No syncope. No chest pain.  Current Outpatient Prescriptions  Medication Sig Dispense Refill  . albuterol (PROVENTIL HFA;VENTOLIN HFA) 108 (90 BASE) MCG/ACT inhaler Inhale 2 puffs into the lungs every 6 (six) hours as needed for wheezing.      [name] ALPRAZolam (XANAX) 0.25 MG tablet Take 1/2 to 1 tablet TID  90 tablet  5  . BAYER MICROLET LANCETS lancets Use as instructed once daily  100 each  5  . Biotin 5000 MCG TABS Take 1 tablet by mouth daily.      . budesonide (PULMICORT) 0.5 MG/2ML nebulizer solution Take 0.5 mg by nebulization 2 (two) times daily. Takes along with pulmo mist      . Coenzyme Q10 (COQ10) 200 MG CAPS Take 1 capsule by mouth daily.      . Cyanocobalamin (VITAMIN B 12 PO) Take by mouth. 1000 mcg once a day       . dexlansoprazole (DEXILANT) 60 MG capsule Take 60 mg by  mouth daily.      [name] dextromethorphan-guaiFENesin (MUCINEX DM) 30-600 MG per 12 hr tablet Take 1 tablet by mouth every 12 (twelve) hours.      [name] ELIQUIS 5 MG TABS tablet take 1 tablet by mouth twice a day  60 tablet  6  . famotidine (PEPCID) 20 MG tablet Take 20 mg by mouth daily.      . Fenofibrate (FENOGLIDE) 120 MG TABS Take 1 tablet by mouth daily.      [name] FENOGLIDE 120 MG TABS take 1 tablet by mouth once daily  30 tablet  6  . furosemide (LASIX) 20 MG tablet Take 1 tablet (20 mg total) by mouth daily.  30 tablet  6  . glimepiride (AMARYL) 2 MG tablet Take 1 tablet (2 mg total) by mouth daily before breakfast.  30 tablet  6  . glucose blood (BAYER CONTOUR TEST) test strip Use as instructed once daily  32 each  6  . metFORMIN (GLUCOPHAGE) 500 MG tablet take 1 tablet by mouth two times daily  60 tablet  6  . Multiple Vitamin (MULTIVITAMIN PO) Take by mouth. Once a day       . nystatin (MYCOSTATIN) 100000 UNIT/ML suspension Take 5 mLs (500,000 Units total) by mouth as needed.  60  mL    . Probiotic Product (ALIGN PO) Take 1 tablet by mouth daily.      [name] UNABLE TO FIND Oxygen 1.5 L nightly      . valsartan (DIOVAN) 160 MG tablet Take 1 tablet (160 mg total) by mouth daily.  30 tablet  6  . verapamil (VERELAN PM) 360 MG 24 hr capsule Take 1 capsule (360 mg total) by mouth at bedtime.  30 capsule  6  . Vitamin D, Ergocalciferol, (DRISDOL) 50000 UNITS CAPS Take 1 capsule (50,000 Units total) by mouth every 7 (seven) days.  4 capsule  12   No current facility-administered medications for this visit.    Allergies  Allergen Reactions  . Cephalexin     REACTION: nausea and diarrhea  . Codeine     REACTION: nausea  . Epinephrine     REACTION: nervous, convulsions  . Erythromycin     REACTION: all mycins cause yeast infections  . Morphine     REACTION: nausea  . Sulfamethoxazole-Trimethoprim     REACTION: nausea and diarrhea    Past Medical History  Diagnosis Date  . Asthma   . COPD  (chronic obstructive pulmonary disease)   . History of lung cancer   . Hypertension   . Paroxysmal atrial fibrillation   . Peripheral vascular disease   . Venous insufficiency   . Hyperlipidemia   . Diabetes mellitus   . Nontoxic multinodular goiter   . Obesity   . GERD (gastroesophageal reflux disease)   . History of nephrolithiasis   . History of bladder cancer   . DJD (degenerative joint disease)   . Osteopenia   . Anxiety   . History of colon polyps 07/233/2009    Tubular Adenomas  . Diverticulosis of sigmoid colon     mild  . Gastritis     moderate  . Hemorrhoids, internal   . Claustrophobia   . Thyroid nodule   . Chronic anticoagulation     Eliquis    Past Surgical History  Procedure Laterality Date  . S/p right rotator cuff repair  1991    Dr. Meade Maw, right  . S/p turbt  10/01    Dr. Vonita Moss  . S/p left tkr  2002    Dr. Darrelyn Hillock  . 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 S/p d&c for removal of endometrial polyp (benign)  12/10    GYN  . Colonoscopy    . Esophagogastroduodenoscopy      History  Smoking status  . Former Smoker -- 0.50 packs/day for 39 years  . Types: Cigarettes  . Quit date: 02/04/1994  Smokeless tobacco  . Never Used    History  Alcohol Use  . Yes    Comment: Occasionally    Family History  Problem Relation Age of Onset  . Asthma Mother   . Heart failure Mother   . Heart attack Father   . Ulcers Father   . Colon cancer Neg Hx     Review of Systems: The review of systems is per the HPI.  All other systems were reviewed and are negative.  Physical Exam: BP 132/76  Pulse 102  Ht 5' (1.524 m)  Wt 196 lb (88.905 kg)  BMI 38.28 kg/m2 Oxygen sat is 90 to 95%. Patient is very pleasant and  in mild acute distress. She is obese. Weight is up 2 pounds. Skin is  warm and dry. Color is normal.  HEENT is unremarkable. Normocephalic/atraumatic. PERRL. Sclera are nonicteric. Neck is supple. No masses. No JVD. Lungs are extremely tight with diffuse wheezing. She has audible wheezing in the exam room - almost having stridor.  Cardiac exam shows an irregular rate and rhythm. Rate is about 120 by my exam. Abdomen is obese but soft. Extremities are without significant edema. Gait and ROM are intact. No gross neurologic deficits noted.  Wt Readings from Last 3 Encounters:  01/15/13 196 lb (88.905 kg)  11/17/12 194 lb 11.2 oz (88.315 kg)  09/30/12 200 lb 6.4 oz (90.901 kg)   LABORATORY DATA: EKG from yesterday with atrial fib with RVR  Lab Results  Component Value Date   WBC 8.7 11/17/2012   HGB 13.7 11/17/2012   HCT 41.4 11/17/2012   PLT 293 11/17/2012   GLUCOSE 170* 11/12/2012   CHOL 196 04/29/2012   TRIG 84.0 04/29/2012   HDL 46.10 04/29/2012   LDLDIRECT 158.9 04/24/2011   LDLCALC 133* 04/29/2012   ALT 32 05/04/2012   AST 25 05/04/2012   NA 143 11/12/2012   K 4.3 11/12/2012   CL 105 11/12/2012   CREATININE 1.50* 11/12/2012   BUN 26* 11/12/2012   CO2 30 09/30/2012   TSH 0.98 04/29/2012   INR 0.94 10/02/2010   HGBA1C 7.4* 09/30/2012   Echo Study Conclusions from August 2014  Left ventricle: The cavity size was normal. Wall thickness was increased in a pattern of moderate LVH. Systolic function was normal. The estimated ejection fraction was in the range of 55% to 60%. Doppler parameters are consistent with abnormal left ventricular relaxation (grade 1 diastolic dysfunction).  Myoview Impression from October 2013 Exercise Capacity: Lexiscan with no exercise.  BP Response: Normal blood pressure response.  Clinical Symptoms: There is dyspnea.  ECG Impression: No significant ST segment change suggestive of ischemia.  Comparison with Prior Nuclear Study: No images to compare  Overall Impression: Normal stress nuclear study.  LV Ejection Fraction: 74%. LV  Wall Motion: NL LV Function; NL Wall Motion  Cassell Clement   CT CHEST FINDINGS FROM 11/2012: Mediastinum: Heart size is normal. There is no significant pericardial fluid, thickening or pericardial calcification. There is atherosclerosis of the thoracic aorta, the great vessels of the mediastinum and the coronary arteries, including calcified atherosclerotic plaque in the left anterior descending, left circumflex and right coronary arteries. In addition, there is severe stenosis at the origin of the left subclavian artery. No pathologically enlarged mediastinal or hilar lymph nodes. Esophagus is unremarkable in appearance. Heterogeneity in the thyroid gland, likely reflective of a mild goiter.  Lungs/Pleura: Status post bilateral upper lobectomies. Compensatory hyperexpansion of the remaining portions of the lungs. No suspicious appearing pulmonary nodules or masses are identified. No acute consolidative airspace disease. No pleural effusions. Mild pleural parenchymal scarring in the periphery of the lungs bilaterally, similar to prior studies.  Upper Abdomen: Diffuse low attenuation throughout the hepatic parenchyma, suggestive of hepatic steatosis. Extensive atherosclerosis.  Musculoskeletal: There are no aggressive appearing lytic or blastic lesions noted in the visualized portions of the skeleton.  IMPRESSION: 1. Status post bilateral upper lobectomies without evidence to suggest local recurrence of disease or new metastatic disease in the thorax on today's examination. 2. Atherosclerosis, including three-vessel coronary artery disease. In addition, there is severe stenosis of the origin of the left remain artery. 3. Hepatic steatosis. 4. Additional incidental findings,  as above.   Electronically Signed By: Trudie Reed M.D. On: 11/12/2012 14:58   Assessment / Plan:  1. PAF - back in atrial fib with RVR - with worsening dyspnea/respiratory failure - exacerbated  by her underlying lung issues. Need to slow her rate down - will place on IV diltiazem. She has been on her Eliquis and says she has had no missed doses. Have discussed her care with Dr. Myrtis Ser (DOD) - we are both in agreement that she needs to be admitted for rate control and stabilization. May need cardioversion as well. Has had fairly recent echo. Step down bed requested.   2. Chronic SOB - has had prior lung cancer, surgery, etc.  3. HTN   4. Chronic anticoagulation  5. Extensive atherosclerosis noted on last CT scan  Patient is agreeable to this plan and will call if any problems develop in the interim.   Rosalio Macadamia, RN, ANP-C [facility] [address] [place], [place]  [address]

## 2013-01-16 DIAGNOSIS — I4891 Unspecified atrial fibrillation: Secondary | ICD-10-CM | POA: Diagnosis present

## 2013-01-16 DIAGNOSIS — J449 Chronic obstructive pulmonary disease, unspecified: Secondary | ICD-10-CM

## 2013-01-16 DIAGNOSIS — E669 Obesity, unspecified: Secondary | ICD-10-CM

## 2013-01-16 LAB — GLUCOSE, CAPILLARY
Glucose-Capillary: 121 mg/dL — ABNORMAL HIGH (ref 70–99)
Glucose-Capillary: 124 mg/dL — ABNORMAL HIGH (ref 70–99)

## 2013-01-16 LAB — TSH: TSH: 0.277 u[IU]/mL — ABNORMAL LOW (ref 0.350–4.500)

## 2013-01-16 LAB — BASIC METABOLIC PANEL
BUN: 27 mg/dL — ABNORMAL HIGH (ref 6–23)
CO2: 29 mEq/L (ref 19–32)
Calcium: 9.7 mg/dL (ref 8.4–10.5)
Chloride: 101 mEq/L (ref 96–112)
Creatinine, Ser: 0.85 mg/dL (ref 0.50–1.10)
GFR calc Af Amer: 75 mL/min — ABNORMAL LOW (ref >90)
GFR calc non Af Amer: 65 mL/min — ABNORMAL LOW (ref >90)
Glucose, Bld: 183 mg/dL — ABNORMAL HIGH (ref 70–99)
Potassium: 4.5 mEq/L (ref 3.5–5.1)
Sodium: 142 mEq/L (ref 135–145)

## 2013-01-16 LAB — TROPONIN I
Troponin I: <0.3 ng/mL (ref <0.30)
Troponin I: <0.3 ng/mL (ref <0.30)

## 2013-01-16 LAB — T4, FREE: Free T4: 1.53 ng/dL (ref 0.80–1.80)

## 2013-01-16 MED ORDER — FLECAINIDE ACETATE 100 MG PO TABS
300.0000 mg | ORAL_TABLET | Freq: Once | ORAL | Status: AC
  Administered 2013-01-16: 300 mg via ORAL
  Filled 2013-01-16: qty 3

## 2013-01-16 MED ORDER — DILTIAZEM HCL ER COATED BEADS 240 MG PO CP24
240.0000 mg | ORAL_CAPSULE | Freq: Every day | ORAL | Status: DC
  Administered 2013-01-16 – 2013-01-17 (×2): 240 mg via ORAL
  Filled 2013-01-16 (×2): qty 1

## 2013-01-16 MED ORDER — FLECAINIDE ACETATE 100 MG PO TABS
100.0000 mg | ORAL_TABLET | Freq: Two times a day (BID) | ORAL | Status: DC
  Administered 2013-01-17: 100 mg via ORAL
  Filled 2013-01-16 (×2): qty 1

## 2013-01-16 NOTE — Progress Notes (Signed)
SUBJECTIVE: The patient is doing well today. She reports SOB with minimal exertion.  Denies CP.  [name] apixaban  5 mg Oral BID  . budesonide (PULMICORT) nebulizer solution  0.5 mg Nebulization BID  . diltiazem  240 mg Oral Daily  . famotidine  20 mg Oral Daily  . [START ON 01/17/2013] flecainide  100 mg Oral Q12H  . flecainide  300 mg Oral Once  . furosemide  40 mg Oral Daily  . glimepiride  2 mg Oral Q breakfast  . irbesartan  300 mg Oral Daily  . metFORMIN  500 mg Oral BID WC  . pantoprazole  40 mg Oral Daily   . sodium chloride 10 mL/hr (01/15/13 1738)    OBJECTIVE: Physical Exam: Filed Vitals:   01/16/13 0400 01/16/13 0725 01/16/13 0853 01/16/13 1133  BP: 128/73 143/63  128/70  Pulse: 88 77  94  Temp:  97.6 F (36.4 C)  98 F (36.7 C)  TempSrc:  Oral  Oral  Resp:  20  20  Height:      Weight:      SpO2: 99% 100% 98% 98%    Intake/Output Summary (Last 24 hours) at 01/16/13 1320 Last data filed at 01/16/13 1000  Gross per 24 hour  Intake 473.84 ml  Output   2425 ml  Net -1951.16 ml    Telemetry reveals afib with some sinus rhythm overnight  GEN- The patient is overweight appearing, alert and oriented x 3 today.   Head- normocephalic, atraumatic Eyes-  Sclera clear, conjunctiva pink Ears- hearing intact Oropharynx- clear Neck- supple,   Lungs- Clear to ausculation bilaterally, normal work of breathing Heart- irregular rate and rhythm  GI- soft, NT, ND, + BS Extremities- no clubbing, cyanosis, or edema Skin- no rash or lesion Psych- euthymic mood, full affect Neuro- strength and sensation are intact  LABS: Basic Metabolic Panel:  Recent Labs  40/98/11 1750 01/16/13 0310  NA 139 142  K 5.7* 4.5  CL 102 101  CO2 25 29  GLUCOSE 158* 183*  BUN 34* 27*  CREATININE 0.82 0.85  CALCIUM 9.9 9.7  MG 2.0  --    Liver Function Tests: No results found for this basename: AST, ALT, ALKPHOS, BILITOT, PROT, ALBUMIN,  in the last 72 hours No results found  for this basename: LIPASE, AMYLASE,  in the last 72 hours CBC:  Recent Labs  01/15/13 1750  WBC 13.2*  NEUTROABS 12.1*  HGB 13.0  HCT 40.4  MCV 100.0  PLT 261   Cardiac Enzymes:  Recent Labs  01/15/13 1750 01/15/13 2347 01/16/13 0310  TROPONINI <0.30 <0.30 <0.30   BNP: No components found with this basename: POCBNP,  D-Dimer: No results found for this basename: DDIMER,  in the last 72 hours Hemoglobin A1C: No results found for this basename: HGBA1C,  in the last 72 hours Fasting Lipid Panel: No results found for this basename: CHOL, HDL, LDLCALC, TRIG, CHOLHDL, LDLDIRECT,  in the last 72 hours Thyroid Function Tests:  Recent Labs  01/15/13 1750  TSH 0.277*   Anemia Panel: No results found for this basename: VITAMINB12, FOLATE, FERRITIN, TIBC, IRON, RETICCTPCT,  in the last 72 hours  RADIOLOGY: X-ray Chest Pa And Lateral  01/16/2013   CLINICAL DATA:  Admission chest radiograph; shortness of breath and atrial fibrillation.  EXAM: CHEST  2 VIEW  COMPARISON:  Chest radiograph performed 06/01/2012, and CT of the chest performed 11/12/2012  FINDINGS: The lungs are well-aerated. Mild left basilar opacity likely reflects  atelectasis. There is no evidence of pleural effusion or pneumothorax.  The heart is borderline enlarged. Right hilar prominence appears to reflect normal vasculature on correlation with prior studies, with adjacent clips. No acute osseous abnormalities are seen.  IMPRESSION: Mild left basilar airspace opacity likely reflects atelectasis. Lungs otherwise clear. Borderline cardiomegaly.   Electronically Signed   By: Roanna Raider M.D.   On: 01/16/2013 00:31   Echo 8/14 reviewed  ASSESSMENT AND PLAN:  Active Problems:   PAROXYSMAL ATRIAL FIBRILLATION   A-fib 1. afib Will start flecainide (no h/o CAD, myoview 2013 normal) Transition cardizem to PO Continue eliquis Consider transfer to telemetry later today or tomorrow  2. obesity Weight loss  advised  3. Chronic lung disease/ SOB Will reassess in sinus   To telemetry later today or tomorrow  Hillis Range, MD 01/16/2013 1:20 PM

## 2013-01-16 NOTE — Progress Notes (Signed)
Utilization Review completed.  

## 2013-01-17 ENCOUNTER — Encounter (HOSPITAL_COMMUNITY)
Admission: AD | Disposition: A | Discharge status: Home / Self Care | Payer: Self-pay | Source: Ambulatory Visit | Attending: Internal Medicine

## 2013-01-17 ENCOUNTER — Inpatient Hospital Stay (HOSPITAL_COMMUNITY): Payer: Medicare Other | Admitting: Certified Registered Nurse Anesthetist

## 2013-01-17 ENCOUNTER — Encounter (HOSPITAL_COMMUNITY): Payer: Medicare Other | Admitting: Certified Registered Nurse Anesthetist

## 2013-01-17 HISTORY — PX: CARDIOVERSION: SHX1299

## 2013-01-17 LAB — GLUCOSE, CAPILLARY
Glucose-Capillary: 108 mg/dL — ABNORMAL HIGH (ref 70–99)
Glucose-Capillary: 107 mg/dL — ABNORMAL HIGH (ref 70–99)
Glucose-Capillary: 56 mg/dL — ABNORMAL LOW (ref 70–99)

## 2013-01-17 SURGERY — CARDIOVERSION
Anesthesia: General

## 2013-01-17 MED ORDER — PROPOFOL 10 MG/ML IV BOLUS
INTRAVENOUS | Status: DC | PRN
  Administered 2013-01-17: 70 mg via INTRAVENOUS

## 2013-01-17 MED ORDER — DILTIAZEM HCL 100 MG IV SOLR
5.0000 mg/h | INTRAVENOUS | Status: DC
  Administered 2013-01-17 – 2013-01-18 (×2): 5 mg/h via INTRAVENOUS
  Filled 2013-01-17: qty 100

## 2013-01-17 MED ORDER — DEXTROSE 50 % IV SOLN
INTRAVENOUS | Status: AC
  Administered 2013-01-17: 25 mL
  Filled 2013-01-17: qty 50

## 2013-01-17 MED ORDER — METOPROLOL TARTRATE 1 MG/ML IV SOLN
INTRAVENOUS | Status: AC
  Filled 2013-01-17: qty 5

## 2013-01-17 NOTE — Anesthesia Postprocedure Evaluation (Signed)
  Anesthesia Post-op Note  Patient: [name]  Procedure(s) Performed: Procedure(s): CARDIOVERSION/BEDSIDE [phone]) (N/A)  Patient Location: Nursing Unit  Anesthesia Type:General  Level of Consciousness: awake and oriented  Airway and Oxygen Therapy: Patient Spontanous Breathing and Patient connected to nasal cannula oxygen  Post-op Pain: none  Post-op Assessment: Post-op Vital signs reviewed, Patient's Cardiovascular Status Stable and Respiratory Function Stable  Post-op Vital Signs: Reviewed and stable  Complications: No apparent anesthesia complications

## 2013-01-17 NOTE — Transfer of Care (Signed)
Immediate Anesthesia Transfer of Care Note  Patient: [name]  Procedure(s) Performed: Procedure(s): CARDIOVERSION/BEDSIDE [phone]) (N/A)  Patient Location: Nursing Unit  Anesthesia Type:General  Level of Consciousness: awake and alert   Airway & Oxygen Therapy: Patient Spontanous Breathing and Patient connected to nasal cannula oxygen  Post-op Assessment: Report given to PACU RN and Post -op Vital signs reviewed and stable  Post vital signs: Reviewed and stable  Complications: No apparent anesthesia complications

## 2013-01-17 NOTE — Progress Notes (Signed)
PT. provided with cardioversion handout, abled to read it, education given.

## 2013-01-17 NOTE — Progress Notes (Addendum)
Cbg-56 mg/dl, asymptomatic,  Z61% 25 ml  Given since pt is npo for cardioversion. Repeat cbg after 15 min is 111mg /dl, MD aware.

## 2013-01-17 NOTE — CV Procedure (Signed)
DC Cardioversion Note  Procedure: DC CV  Indication: atrial fib  Findings: After informed consent was obtained, the patient was prepped in the usual manner and the electrodispersive pad was place in the AP position. After IV propofol was given under the direction of Dr. Sampson Goon, 200 J of synchronized DC energy was applied, restoring sinus rhythm. The patient maintained NSR for 1 minute and then returned to atrial fib (ERAF). She was cardioverted again and maintained NSR for 2 beats before immediately going into atrial fib. She was allowed to awaken and had no hemodynamic sequelae.  Conclusion: unsuccessful DCCV with the patient developing early return of atrial fib after initially going back to NSR.  [name].D.

## 2013-01-17 NOTE — Interval H&P Note (Signed)
History and Physical Interval Note:  01/17/2013 2:45 PM  [name]  has presented today for surgery, with the diagnosis of /  The various methods of treatment have been discussed with the patient and family. After consideration of risks, benefits and other options for treatment, the patient has consented to  Procedure(s): CARDIOVERSION/BEDSIDE (1O10) (N/A) as a surgical intervention .  The patient's history has been reviewed, patient examined, no change in status, stable for surgery.  I have reviewed the patient's chart and labs.  Questions were answered to the patient's satisfaction.     Lewayne Bunting

## 2013-01-17 NOTE — Progress Notes (Signed)
As per Dr. Ladona Ridgel, pt. has to stay in stepdown.

## 2013-01-17 NOTE — Progress Notes (Addendum)
SUBJECTIVE: The patient is doing well today. She reports SOB with minimal exertion.  Denies CP.  [name] apixaban  5 mg Oral BID  . budesonide (PULMICORT) nebulizer solution  0.5 mg Nebulization BID  . diltiazem  240 mg Oral Daily  . famotidine  20 mg Oral Daily  . flecainide  100 mg Oral Q12H  . furosemide  40 mg Oral Daily  . glimepiride  2 mg Oral Q breakfast  . irbesartan  300 mg Oral Daily  . metFORMIN  500 mg Oral BID WC  . pantoprazole  40 mg Oral Daily   . sodium chloride Stopped (01/16/13 2000)    OBJECTIVE: Physical Exam: Filed Vitals:   01/17/13 0600 01/17/13 0800 01/17/13 1000 01/17/13 1013  BP: 133/71 117/52 136/77   Pulse: 102 93    Temp:  98.5 F (36.9 C)    TempSrc:  Oral    Resp: 20 20    Height:      Weight:      SpO2: 98% 98%  98%    Intake/Output Summary (Last 24 hours) at 01/17/13 1024 Last data filed at 01/17/13 0900  Gross per 24 hour  Intake    340 ml  Output      0 ml  Net    340 ml    Telemetry reveals afib   GEN- The patient is overweight appearing, alert and oriented x 3 today.   Head- normocephalic, atraumatic Eyes-  Sclera clear, conjunctiva pink Ears- hearing intact Oropharynx- clear Neck- supple,   Lungs- Clear to ausculation bilaterally, normal work of breathing Heart- irregular rate and rhythm  GI- soft, NT, ND, + BS Extremities- no clubbing, cyanosis, or edema Skin- no rash or lesion Psych- euthymic mood, full affect Neuro- strength and sensation are intact  LABS: Basic Metabolic Panel:  Recent Labs  16/10/96 1750 01/16/13 0310  NA 139 142  K 5.7* 4.5  CL 102 101  CO2 25 29  GLUCOSE 158* 183*  BUN 34* 27*  CREATININE 0.82 0.85  CALCIUM 9.9 9.7  MG 2.0  --    Liver Function Tests: No results found for this basename: AST, ALT, ALKPHOS, BILITOT, PROT, ALBUMIN,  in the last 72 hours No results found for this basename: LIPASE, AMYLASE,  in the last 72 hours CBC:  Recent Labs  01/15/13 1750  WBC 13.2*    NEUTROABS 12.1*  HGB 13.0  HCT 40.4  MCV 100.0  PLT 261   Cardiac Enzymes:  Recent Labs  01/15/13 1750 01/15/13 2347 01/16/13 0310  TROPONINI <0.30 <0.30 <0.30   BNP: No components found with this basename: POCBNP,  D-Dimer: No results found for this basename: DDIMER,  in the last 72 hours Hemoglobin A1C: No results found for this basename: HGBA1C,  in the last 72 hours Fasting Lipid Panel: No results found for this basename: CHOL, HDL, LDLCALC, TRIG, CHOLHDL, LDLDIRECT,  in the last 72 hours Thyroid Function Tests:  Recent Labs  01/15/13 1750  TSH 0.277*   Anemia Panel: No results found for this basename: VITAMINB12, FOLATE, FERRITIN, TIBC, IRON, RETICCTPCT,  in the last 72 hours  RADIOLOGY: X-ray Chest Pa And Lateral  01/16/2013   CLINICAL DATA:  Admission chest radiograph; shortness of breath and atrial fibrillation.  EXAM: CHEST  2 VIEW  COMPARISON:  Chest radiograph performed 06/01/2012, and CT of the chest performed 11/12/2012  FINDINGS: The lungs are well-aerated. Mild left basilar opacity likely reflects atelectasis. There is no evidence of pleural effusion or  pneumothorax.  The heart is borderline enlarged. Right hilar prominence appears to reflect normal vasculature on correlation with prior studies, with adjacent clips. No acute osseous abnormalities are seen.  IMPRESSION: Mild left basilar airspace opacity likely reflects atelectasis. Lungs otherwise clear. Borderline cardiomegaly.   Electronically Signed   By: Roanna Raider M.D.   On: 01/16/2013 00:31   Echo 8/14 reviewed  ASSESSMENT AND PLAN:  Active Problems:   PAROXYSMAL ATRIAL FIBRILLATION   A-fib 1. afib Now on flecainide (no h/o CAD, myoview 2013 normal) and PO cardizem Continue eliquis Will cardiovert later today (2pm) Risks, benefits, and alternatives to cardioversion were discussed with the patient and her spouse who wish to proceed.  If she does not maintain sinus with flecainide then we will  consider tikosyn  2. obesity Weight loss advised  3. Chronic lung disease/ SOB Will reassess in sinus   To telemetry later today once in sinus  Hillis Range, MD 01/17/2013 10:24 AM

## 2013-01-17 NOTE — H&P (View-Only) (Signed)
 SUBJECTIVE: The patient is doing well today. She reports SOB with minimal exertion.  Denies CP.  . apixaban  5 mg Oral BID  . budesonide (PULMICORT) nebulizer solution  0.5 mg Nebulization BID  . diltiazem  240 mg Oral Daily  . famotidine  20 mg Oral Daily  . flecainide  100 mg Oral Q12H  . furosemide  40 mg Oral Daily  . glimepiride  2 mg Oral Q breakfast  . irbesartan  300 mg Oral Daily  . metFORMIN  500 mg Oral BID WC  . pantoprazole  40 mg Oral Daily   . sodium chloride Stopped (01/16/13 2000)    OBJECTIVE: Physical Exam: Filed Vitals:   01/17/13 0600 01/17/13 0800 01/17/13 1000 01/17/13 1013  BP: 133/71 117/52 136/77   Pulse: 102 93    Temp:  98.5 F (36.9 C)    TempSrc:  Oral    Resp: 20 20    Height:      Weight:      SpO2: 98% 98%  98%    Intake/Output Summary (Last 24 hours) at 01/17/13 1024 Last data filed at 01/17/13 0900  Gross per 24 hour  Intake    340 ml  Output      0 ml  Net    340 ml    Telemetry reveals afib   GEN- The patient is overweight appearing, alert and oriented x 3 today.   Head- normocephalic, atraumatic Eyes-  Sclera clear, conjunctiva pink Ears- hearing intact Oropharynx- clear Neck- supple,   Lungs- Clear to ausculation bilaterally, normal work of breathing Heart- irregular rate and rhythm  GI- soft, NT, ND, + BS Extremities- no clubbing, cyanosis, or edema Skin- no rash or lesion Psych- euthymic mood, full affect Neuro- strength and sensation are intact  LABS: Basic Metabolic Panel:  Recent Labs  01/15/13 1750 01/16/13 0310  NA 139 142  K 5.7* 4.5  CL 102 101  CO2 25 29  GLUCOSE 158* 183*  BUN 34* 27*  CREATININE 0.82 0.85  CALCIUM 9.9 9.7  MG 2.0  --    Liver Function Tests: No results found for this basename: AST, ALT, ALKPHOS, BILITOT, PROT, ALBUMIN,  in the last 72 hours No results found for this basename: LIPASE, AMYLASE,  in the last 72 hours CBC:  Recent Labs  01/15/13 1750  WBC 13.2*    NEUTROABS 12.1*  HGB 13.0  HCT 40.4  MCV 100.0  PLT 261   Cardiac Enzymes:  Recent Labs  01/15/13 1750 01/15/13 2347 01/16/13 0310  TROPONINI <0.30 <0.30 <0.30   BNP: No components found with this basename: POCBNP,  D-Dimer: No results found for this basename: DDIMER,  in the last 72 hours Hemoglobin A1C: No results found for this basename: HGBA1C,  in the last 72 hours Fasting Lipid Panel: No results found for this basename: CHOL, HDL, LDLCALC, TRIG, CHOLHDL, LDLDIRECT,  in the last 72 hours Thyroid Function Tests:  Recent Labs  01/15/13 1750  TSH 0.277*   Anemia Panel: No results found for this basename: VITAMINB12, FOLATE, FERRITIN, TIBC, IRON, RETICCTPCT,  in the last 72 hours  RADIOLOGY: X-ray Chest Pa And Lateral  01/16/2013   CLINICAL DATA:  Admission chest radiograph; shortness of breath and atrial fibrillation.  EXAM: CHEST  2 VIEW  COMPARISON:  Chest radiograph performed 06/01/2012, and CT of the chest performed 11/12/2012  FINDINGS: The lungs are well-aerated. Mild left basilar opacity likely reflects atelectasis. There is no evidence of pleural effusion or   pneumothorax.  The heart is borderline enlarged. Right hilar prominence appears to reflect normal vasculature on correlation with prior studies, with adjacent clips. No acute osseous abnormalities are seen.  IMPRESSION: Mild left basilar airspace opacity likely reflects atelectasis. Lungs otherwise clear. Borderline cardiomegaly.   Electronically Signed   By: Jeffery  Chang M.D.   On: 01/16/2013 00:31   Echo 8/14 reviewed  ASSESSMENT AND PLAN:  Active Problems:   PAROXYSMAL ATRIAL FIBRILLATION   A-fib 1. afib Now on flecainide (no h/o CAD, myoview 2013 normal) and PO cardizem Continue eliquis Will cardiovert later today (2pm) Risks, benefits, and alternatives to cardioversion were discussed with the patient and her spouse who wish to proceed.  If she does not maintain sinus with flecainide then we will  consider tikosyn  2. obesity Weight loss advised  3. Chronic lung disease/ SOB Will reassess in sinus   To telemetry later today once in sinus  Crislyn Willbanks, MD 01/17/2013 10:24 AM  

## 2013-01-17 NOTE — Preoperative (Signed)
Beta Blockers   Reason not to administer Beta Blockers:Not Applicable 

## 2013-01-17 NOTE — Progress Notes (Signed)
Cardioversion done by  Dr Ladona Ridgel at bedside  under anesthesia,  With 200 jouls x2. Converted to SR for a min. And back to a-fib. Fully awake after few min with no complaints presented. Continue to monitor.

## 2013-01-17 NOTE — Anesthesia Preprocedure Evaluation (Addendum)
Anesthesia Evaluation  Patient identified by MRN, date of birth, ID band Patient awake    Reviewed: Allergy & Precautions, H&P , NPO status , Patient's Chart, lab work & pertinent test results  Airway Mallampati: III TM Distance: >3 FB Neck ROM: Full    Dental no notable dental hx. (+) Teeth Intact   Pulmonary asthma , COPD COPD inhaler and oxygen dependent, former smoker,  breath sounds clear to auscultation  Pulmonary exam normal       Cardiovascular hypertension, Pt. on medications + Peripheral Vascular Disease + dysrhythmias Atrial Fibrillation Rhythm:Irregular Rate:Tachycardia     Neuro/Psych Anxiety negative neurological ROS  negative psych ROS   GI/Hepatic Neg liver ROS, GERD-  Medicated and Controlled,  Endo/Other  diabetes, Type 2, Oral Hypoglycemic AgentsMorbid obesity  Renal/GU negative Renal ROS  negative genitourinary   Musculoskeletal   Abdominal   Peds  Hematology negative hematology ROS (+)   Anesthesia Other Findings   Reproductive/Obstetrics negative OB ROS                         Anesthesia Physical Anesthesia Plan  ASA: III  Anesthesia Plan: General   Post-op Pain Management:    Induction: Intravenous  Airway Management Planned: Mask  Additional Equipment:   Intra-op Plan:   Post-operative Plan:   Informed Consent: I have reviewed the patients History and Physical, chart, labs and discussed the procedure including the risks, benefits and alternatives for the proposed anesthesia with the patient or authorized representative who has indicated his/her understanding and acceptance.   Dental advisory given  Plan Discussed with: CRNA, Anesthesiologist and Surgeon  Anesthesia Plan Comments:        Anesthesia Quick Evaluation

## 2013-01-18 LAB — GLUCOSE, CAPILLARY
Glucose-Capillary: 121 mg/dL — ABNORMAL HIGH (ref 70–99)
Glucose-Capillary: 102 mg/dL — ABNORMAL HIGH (ref 70–99)
Glucose-Capillary: 97 mg/dL (ref 70–99)
Glucose-Capillary: 168 mg/dL — ABNORMAL HIGH (ref 70–99)

## 2013-01-18 MED ORDER — DILTIAZEM HCL ER COATED BEADS 240 MG PO CP24
240.0000 mg | ORAL_CAPSULE | Freq: Every day | ORAL | Status: DC
  Administered 2013-01-18 – 2013-01-21 (×4): 240 mg via ORAL
  Filled 2013-01-18 (×4): qty 1

## 2013-01-18 NOTE — Progress Notes (Signed)
Patient: [name] Date of Encounter: 01/18/2013, 7:45 AM Admit date: 01/15/2013     Subjective  Ms. Meharg has no new complaints.    Objective  Physical Exam: Vitals: BP 142/76  Pulse 99  Temp(Src) 97.9 F (36.6 C) (Oral)  Resp 16  Ht 5' (1.524 m)  Wt 205 lb 14.6 oz (93.4 kg)  BMI 40.21 kg/m2  SpO2 97% General: Well developed, well appearing 78 year old female in no acute distress. Neck: Supple. JVD not elevated. Lungs: Clear bilaterally to auscultation without wheezes, rales, or rhonchi. Breathing is unlabored. Heart: Irregular S1 S2 without murmurs, rubs, or gallops.  Abdomen: Soft, non-distended. Extremities: No clubbing or cyanosis. No edema.  Distal pedal pulses are 2+ and equal bilaterally. Neuro: Alert and oriented X 3. Moves all extremities spontaneously. No focal deficits.  Intake/Output:  Intake/Output Summary (Last 24 hours) at 01/18/13 0745 Last data filed at 01/18/13 0600  Gross per 24 hour  Intake    865 ml  Output    850 ml  Net     15 ml    Inpatient Medications:  . apixaban  5 mg Oral BID  . budesonide (PULMICORT) nebulizer solution  0.5 mg Nebulization BID  . famotidine  20 mg Oral Daily  . furosemide  40 mg Oral Daily  . glimepiride  2 mg Oral Q breakfast  . irbesartan  300 mg Oral Daily  . metFORMIN  500 mg Oral BID WC  . pantoprazole  40 mg Oral Daily   . sodium chloride 10 mL (01/18/13 0408)  . diltiazem (CARDIZEM) infusion 5 mg/hr (01/18/13 0407)    Labs:  Recent Labs  01/15/13 1750 01/16/13 0310  NA 139 142  K 5.7* 4.5  CL 102 101  CO2 25 29  GLUCOSE 158* 183*  BUN 34* 27*  CREATININE 0.82 0.85  CALCIUM 9.9 9.7  MG 2.0  --     Recent Labs  01/15/13 1750  WBC 13.2*  NEUTROABS 12.1*  HGB 13.0  HCT 40.4  MCV 100.0  PLT 261    Recent Labs  01/15/13 1750 01/15/13 2347 01/16/13 0310  TROPONINI <0.30 <0.30 <0.30    Recent Labs  01/15/13 1750  TSH 0.277*    Recent Labs  01/15/13 1750  INR 1.36     Radiology/Studies: X-ray Chest Pa And Lateral  01/16/2013   CLINICAL DATA:  Admission chest radiograph; shortness of breath and atrial fibrillation.  EXAM: CHEST  2 VIEW  COMPARISON:  Chest radiograph performed 06/01/2012, and CT of the chest performed 11/12/2012  FINDINGS: The lungs are well-aerated. Mild left basilar opacity likely reflects atelectasis. There is no evidence of pleural effusion or pneumothorax.  The heart is borderline enlarged. Right hilar prominence appears to reflect normal vasculature on correlation with prior studies, with adjacent clips. No acute osseous abnormalities are seen.  IMPRESSION: Mild left basilar airspace opacity likely reflects atelectasis. Lungs otherwise clear. Borderline cardiomegaly.   Electronically Signed   By: Roanna Raider M.D.   On: 01/16/2013 00:31   Echocardiogram: normal LVEF by echo August 2014 Telemetry: AFib, rate in 80s   Assessment and Plan  1. Persistent AFib, ERAF s/p DCCV yesterday 2. COPD 3. HTN 4. Negative Myoview 2013 5. Normal LVEF  Transfer to telemetry. Dr. Johney Frame to see. Signed, EDMISTEN, BROOKE PA-C  I have seen, examined the patient, and reviewed the above assessment and plan.  Changes to above are made where necessary.  She has failed cardioversion on flecainide.  Will let flecainide washout and then begin tikosyn load tomorrow. OK for telemetry.  Co Sign: Hillis Range, MD 01/18/2013 8:33 AM

## 2013-01-18 NOTE — Progress Notes (Signed)
Transported via w/c to 3W01 w/O2.  Walked to bedside and connected to Telemetry monitor by NT.  Patient awake, alert and oriented at time of transfer w/husband at bedside.

## 2013-01-18 NOTE — Progress Notes (Signed)
Inpatient Diabetes Program Recommendations  AACE/ADA: New Consensus Statement on Inpatient Glycemic Control (2013)  Target Ranges:  Prepandial:   less than 140 mg/dL      Peak postprandial:   less than 180 mg/dL (1-2 hours)      Critically ill patients:  140 - 180 mg/dL  Results for KAMEKO, HUKILL (MRN [id number]) as of 01/18/2013 11:38  Ref. Range 01/16/2013 21:55 01/17/2013 08:25 01/17/2013 12:16 01/17/2013 16:52 01/17/2013 21:32  Glucose-Capillary Latest Range: 70-99 mg/dL 409 (H) 811 (H) 56 (L) 161 (H) 106 (H)    Inpatient Diabetes Program Recommendations Correction (SSI): use Novolog sensitive scale if needed Oral Agents: Discontinue Amaryl while hospitalized Thank you  Piedad Climes BSN, RN,CDE Inpatient Diabetes Coordinator [phone] (team pager)

## 2013-01-19 ENCOUNTER — Encounter (HOSPITAL_COMMUNITY): Payer: Self-pay | Admitting: Internal Medicine

## 2013-01-19 DIAGNOSIS — I10 Essential (primary) hypertension: Secondary | ICD-10-CM

## 2013-01-19 LAB — GLUCOSE, CAPILLARY
Glucose-Capillary: 98 mg/dL (ref 70–99)
Glucose-Capillary: 127 mg/dL — ABNORMAL HIGH (ref 70–99)
Glucose-Capillary: 91 mg/dL (ref 70–99)
Glucose-Capillary: 95 mg/dL (ref 70–99)

## 2013-01-19 LAB — MAGNESIUM: Magnesium: 1.8 mg/dL (ref 1.5–2.5)

## 2013-01-19 LAB — BASIC METABOLIC PANEL
Chloride: 103 mEq/L (ref 96–112)
GFR calc Af Amer: >90 mL/min (ref >90)
GFR calc non Af Amer: 79 mL/min — ABNORMAL LOW (ref >90)
Potassium: 4.3 mEq/L (ref 3.5–5.1)
Sodium: 142 mEq/L (ref 135–145)

## 2013-01-19 MED ORDER — SODIUM CHLORIDE 0.9 % IJ SOLN
3.0000 mL | Freq: Two times a day (BID) | INTRAMUSCULAR | Status: DC
  Administered 2013-01-19 – 2013-01-20 (×4): 3 mL via INTRAVENOUS

## 2013-01-19 MED ORDER — MAGIC MOUTHWASH W/LIDOCAINE
5.0000 mL | Freq: Three times a day (TID) | ORAL | Status: DC | PRN
  Administered 2013-01-19 (×2): 5 mL via ORAL
  Filled 2013-01-19: qty 5

## 2013-01-19 MED ORDER — SODIUM CHLORIDE 0.9 % IJ SOLN: 3.0000 mL | INTRAMUSCULAR | Status: DC | PRN

## 2013-01-19 MED ORDER — DOFETILIDE 500 MCG PO CAPS
500.0000 ug | ORAL_CAPSULE | Freq: Two times a day (BID) | ORAL | Status: DC
  Administered 2013-01-19 – 2013-01-20 (×4): 500 ug via ORAL
  Filled 2013-01-19 (×7): qty 1

## 2013-01-19 MED ORDER — SODIUM CHLORIDE 0.9 % IV SOLN: 250.0000 mL | INTRAVENOUS | Status: DC | PRN

## 2013-01-19 MED ORDER — MAGNESIUM SULFATE 40 MG/ML IJ SOLN
2.0000 g | Freq: Once | INTRAMUSCULAR | Status: AC
  Administered 2013-01-19: 2 g via INTRAVENOUS
  Filled 2013-01-19: qty 50

## 2013-01-19 NOTE — Care Management Note (Addendum)
    Page 1 of 2   01/21/2013     11:26:10 AM   CARE MANAGEMENT NOTE 01/21/2013  Patient:  [name], [name]   Account Number:  [id number]  Date Initiated:  01/19/2013  Documentation initiated by:  GRAVES-BIGELOW,BRENDA  Subjective/Objective Assessment:   Pt admitted for afib RVR. S/p cardioversion 01-17-13.  Pt failed cardioversion on flecainide.  Flecainide washout and then begin tikosyn load per MD notes.     Action/Plan:   Benefits check in process for tikosyn. Will make pt aware once completed. MD please write for Rx for 7 day supply no refills to be filled via main Pharmacy & original Rx with refills.   Anticipated DC Date:  01/22/2013   Anticipated DC Plan:  HOME/SELF CARE      DC Planning Services  CM consult  Medication Assistance      Choice offered to / List presented to:             Status of service:  Completed, signed off Medicare Important Message given?   (If response is "NO", the following Medicare IM given date fields will be blank) Date Medicare IM given:   Date Additional Medicare IM given:    Discharge Disposition:  HOME/SELF CARE  Per UR Regulation:  Reviewed for med. necessity/level of care/duration of stay  If discussed at Long Length of Stay Meetings, dates discussed:   01/21/2013    Comments:  01/21/13- 1100- Donn Pierini RN, BSN [phone] Pt went back into afib- Tikosyn discontinued- and pt started on po amio- no further needs for d/c- pt discharged home today.   01-20-13 0950 Tikosyn- per rep at [facility]: tier 3 medication; no auth required; co-pay at retail $45.00/ mail service $135.00. Will make pt aware of cost. Medication is available at the [address] Pt will need Rx for 7 day supply to be filled in main Pharmacy. CM will assist with medication. Thanks Gala Lewandowsky, RN,BSN [phone]

## 2013-01-19 NOTE — Progress Notes (Signed)
Alinda Money, Pa notified that patient converted to NSR. EKG done and in computer. This info relayed to him. Pt aware. Will cont to monitor

## 2013-01-19 NOTE — Progress Notes (Signed)
   SUBJECTIVE: The patient is doing well today.  At this time, she denies chest pain, shortness of breath, or any new concerns.  Her tongue is sore.  She attributes this to her pulmicort.  Failed cardioversion 01-17-2013 on Flecainide - plan to start Tikosyn today.  BMET/Mg pending this morning.  CURRENT MEDICATIONS: . apixaban  5 mg Oral BID  . budesonide (PULMICORT) nebulizer solution  0.5 mg Nebulization BID  . diltiazem  240 mg Oral Daily  . famotidine  20 mg Oral Daily  . furosemide  40 mg Oral Daily  . glimepiride  2 mg Oral Q breakfast  . irbesartan  300 mg Oral Daily  . metFORMIN  500 mg Oral BID WC  . pantoprazole  40 mg Oral Daily   . sodium chloride 10 mL (01/18/13 0408)    OBJECTIVE: Physical Exam: Filed Vitals:   01/18/13 1256 01/18/13 1947 01/18/13 2045 01/19/13 0553  BP: 143/73  129/61 109/71  Pulse: 101  97 103  Temp: 97.2 F (36.2 C)  98 F (36.7 C) 98.4 F (36.9 C)  TempSrc: Oral  Oral Oral  Resp: 16  20 20   Height:      Weight:    204 lb 5.9 oz (92.7 kg)  SpO2: 97% 97% 96% 100%    Intake/Output Summary (Last 24 hours) at 01/19/13 0630 Last data filed at 01/18/13 1449  Gross per 24 hour  Intake    510 ml  Output      0 ml  Net    510 ml    Telemetry reveals atrial fibrillation, ventricular rates 90-110  GEN- The patient is well appearing, alert and oriented x 3 today.   Head- normocephalic, atraumatic Eyes-  Sclera clear, conjunctiva pink Ears- hearing intact Oropharynx- clear, no thrush Neck- supple, no JVP Lymph- no cervical lymphadenopathy Lungs- Clear to ausculation bilaterally, normal work of breathing Heart- irregular rate and rhythm, no murmurs, rubs or gallops, PMI not laterally displaced GI- soft, NT, ND, + BS Extremities- no clubbing, cyanosis, or edema Skin- no rash or lesion Psych- euthymic mood, full affect Neuro- strength and sensation are intact  RADIOLOGY: X-ray Chest Pa And Lateral 01/16/2013   CLINICAL DATA:  Admission  chest radiograph; shortness of breath and atrial fibrillation.  EXAM: CHEST  2 VIEW  COMPARISON:  Chest radiograph performed 06/01/2012, and CT of the chest performed 11/12/2012  FINDINGS: The lungs are well-aerated. Mild left basilar opacity likely reflects atelectasis. There is no evidence of pleural effusion or pneumothorax.  The heart is borderline enlarged. Right hilar prominence appears to reflect normal vasculature on correlation with prior studies, with adjacent clips. No acute osseous abnormalities are seen.  IMPRESSION: Mild left basilar airspace opacity likely reflects atelectasis. Lungs otherwise clear. Borderline cardiomegaly.   Electronically Signed   By: Roanna Raider M.D.   On: 01/16/2013 00:31    ASSESSMENT AND PLAN:  Active Problems:   PAROXYSMAL ATRIAL FIBRILLATION   A-fib  1. Persistent afib Rate controlled Start tikosyn today- risks, benefits, and alternatives to this medicine were discussed at length with the patient who wishes to proceed. Continue eliquis  2. COPD Stable Will try magic mouth wash for her irritated tongue.  I do not see thrush  3. HTN Stable No change required today

## 2013-01-20 LAB — BASIC METABOLIC PANEL
BUN: 26 mg/dL — ABNORMAL HIGH (ref 6–23)
CO2: 34 mEq/L — ABNORMAL HIGH (ref 19–32)
Chloride: 104 mEq/L (ref 96–112)
Creatinine, Ser: 0.88 mg/dL (ref 0.50–1.10)
GFR calc Af Amer: 72 mL/min — ABNORMAL LOW (ref >90)
GFR calc non Af Amer: 62 mL/min — ABNORMAL LOW (ref >90)
Glucose, Bld: 114 mg/dL — ABNORMAL HIGH (ref 70–99)
Potassium: 4.3 mEq/L (ref 3.5–5.1)
Sodium: 143 mEq/L (ref 135–145)

## 2013-01-20 LAB — MAGNESIUM: Magnesium: 1.8 mg/dL (ref 1.5–2.5)

## 2013-01-20 LAB — GLUCOSE, CAPILLARY: Glucose-Capillary: 106 mg/dL — ABNORMAL HIGH (ref 70–99)

## 2013-01-20 NOTE — Progress Notes (Signed)
Pt expressed concern with who her follow up appointments will be with, who her primary care doctor will be, and which doctors she will need to continue seeing.  Social Work/Case Management consult offered and refused by pt.  Pt will call doctors tomorrow and will discuss with Dr. Johney Frame.  Will inform day shift RN to ensure that the pt is comfortable with the plan when discharged.

## 2013-01-20 NOTE — Progress Notes (Signed)
   SUBJECTIVE: The patient is doing well today.  At this time, she denies chest pain, shortness of breath, or any new concerns.    Converted to SR yesterday after second dose of Tikosyn  CURRENT MEDICATIONS: . apixaban  5 mg Oral BID  . budesonide (PULMICORT) nebulizer solution  0.5 mg Nebulization BID  . diltiazem  240 mg Oral Daily  . dofetilide  500 mcg Oral Q12H  . famotidine  20 mg Oral Daily  . furosemide  40 mg Oral Daily  . glimepiride  2 mg Oral Q breakfast  . irbesartan  300 mg Oral Daily  . metFORMIN  500 mg Oral BID WC  . pantoprazole  40 mg Oral Daily  . sodium chloride  3 mL Intravenous Q12H   . sodium chloride 10 mL (01/18/13 0408)    OBJECTIVE: Physical Exam: Filed Vitals:   01/19/13 1947 01/19/13 2133 01/20/13 0149 01/20/13 0500  BP:  104/81  137/54  Pulse:  86  83  Temp:  97.8 F (36.6 C)  98.4 F (36.9 C)  TempSrc:  Oral  Oral  Resp:  20  18  Height:      Weight:   202 lb 6.1 oz (91.8 kg)   SpO2: 95% 96%  100%    Intake/Output Summary (Last 24 hours) at 01/20/13 8657 Last data filed at 01/19/13 2006  Gross per 24 hour  Intake    240 ml  Output      0 ml  Net    240 ml    Telemetry reveals atrial fibrillation with conversion to SR last night   GEN- The patient is well appearing, alert and oriented x 3 today.   Head- normocephalic, atraumatic Eyes-  Sclera clear, conjunctiva pink Ears- hearing intact Oropharynx- clear, no thrush Neck- supple, no JVP Lymph- no cervical lymphadenopathy Lungs- Clear to ausculation bilaterally, normal work of breathing Heart- regular rate and rhythm, no murmurs, rubs or gallops, PMI not laterally displaced GI- soft, NT, ND, + BS Extremities- no clubbing, cyanosis, or edema Neuro- strength and sensation are intact  RADIOLOGY: X-ray Chest Pa And Lateral 01/16/2013   CLINICAL DATA:  Admission chest radiograph; shortness of breath and atrial fibrillation.  EXAM: CHEST  2 VIEW  COMPARISON:  Chest radiograph  performed 06/01/2012, and CT of the chest performed 11/12/2012  FINDINGS: The lungs are well-aerated. Mild left basilar opacity likely reflects atelectasis. There is no evidence of pleural effusion or pneumothorax.  The heart is borderline enlarged. Right hilar prominence appears to reflect normal vasculature on correlation with prior studies, with adjacent clips. No acute osseous abnormalities are seen.  IMPRESSION: Mild left basilar airspace opacity likely reflects atelectasis. Lungs otherwise clear. Borderline cardiomegaly.   Electronically Signed   By: Roanna Raider M.D.   On: 01/16/2013 00:31   EKG - sinus rhythm rate 76, QTc 465  ASSESSMENT AND PLAN:  Active Problems:   PAROXYSMAL ATRIAL FIBRILLATION   A-fib  1. Persistent afib Rate controlled Doing well with tikosyn, QT ist stable Continue eliquis  2. COPD Stable  3. HTN Stable No change required today

## 2013-01-21 LAB — MAGNESIUM: Magnesium: 1.8 mg/dL (ref 1.5–2.5)

## 2013-01-21 LAB — BASIC METABOLIC PANEL
BUN: 25 mg/dL — ABNORMAL HIGH (ref 6–23)
CO2: 29 mEq/L (ref 19–32)
Calcium: 9.6 mg/dL (ref 8.4–10.5)
Chloride: 104 mEq/L (ref 96–112)
Creatinine, Ser: 0.76 mg/dL (ref 0.50–1.10)
GFR calc Af Amer: >90 mL/min (ref >90)
GFR calc non Af Amer: 80 mL/min — ABNORMAL LOW (ref >90)
Glucose, Bld: 96 mg/dL (ref 70–99)
Potassium: 3.9 mEq/L (ref 3.5–5.1)
Sodium: 141 mEq/L (ref 135–145)

## 2013-01-21 LAB — GLUCOSE, CAPILLARY: Glucose-Capillary: 87 mg/dL (ref 70–99)

## 2013-01-21 MED ORDER — IRBESARTAN 300 MG PO TABS: 300.0000 mg | ORAL_TABLET | Freq: Every day | ORAL | Status: DC

## 2013-01-21 MED ORDER — FUROSEMIDE 20 MG PO TABS: 40.0000 mg | ORAL_TABLET | Freq: Every day | ORAL | Status: DC

## 2013-01-21 MED ORDER — DILTIAZEM HCL ER COATED BEADS 240 MG PO CP24: 240.0000 mg | ORAL_CAPSULE | Freq: Every day | ORAL | Status: DC

## 2013-01-21 NOTE — Progress Notes (Signed)
Pt discharged to home per MD order. Pt and husband received and reviewed all discharge instructions and medication information including follow-up appointments and prescription information. Pt and husband verbalized understanding. Pt alert and oriented at discharge with no complaints of pain. Pt escorted to private vehicle via wheelchair by guest services volunteer. [name]

## 2013-01-21 NOTE — Progress Notes (Signed)
Pt went into afib between 117 and 136 bpm at around 2230.  EKG done, MD notified.  When MD returned call, Pt had converted back to NSR between 80-90 bpm at 2330.  No new orders at this time.  Will continue to monitor.

## 2013-01-21 NOTE — Discharge Summary (Signed)
ELECTROPHYSIOLOGY DISCHARGE SUMMARY    Patient ID: [name],  MRN: [id number], DOB/AGE: 1935/01/12 78 y.o.  Admit date: 01/15/2013 Discharge date: 01/21/2013  Primary Care Physician: Alroy Dust, MD Primary Cardiologist: Hillis Range, MD  Primary Discharge Diagnosis:  1. Persistent atrial fibrillation  Secondary Discharge Diagnoses:  1. COPD 2. Lung CA s/p wedge resection of LUL Jan 2011 and RUL lobectomy Nov 2005 3. HTN 4. DM 5. Dyslipidemia 6. PVD 7. Thyroid nodules 8. GERD  Procedures This Admission:  1. Direct current cardioversion 01/17/2013 Findings: After informed consent was obtained, the patient was prepped in the usual manner and the electrodispersive pad was place in the AP position. After IV propofol was given under the direction of Dr. Sampson Goon, 200 J of synchronized DC energy was applied, restoring sinus rhythm. The patient maintained NSR for 1 minute and then returned to atrial fib (ERAF). She was cardioverted again and maintained NSR for 2 beats before immediately going into atrial fib. She was allowed to awaken and had no hemodynamic sequelae.  Conclusion: unsuccessful DCCV with the patient developing early return of atrial fib after initially going back to NSR.  History and Hospital Course:  [name] is a 78 year old woman with the above problem list who was admitted on 01/15/2013 with symptomatic atrial fibrillation. She was started on IV diltiazem for rate control. Eliquis was continued. On 01/16/2013, she was given flecainide which was unsuccessful for chemical cardioversion to SR. She remained in AF. Diltiazem was transitioned to PO. On 01/17/2013, she underwent DCCV which was unsuccessful as she experienced ERAF after initially going back to SR. Flecainide was discontinued. Verapamil and HCTZ were discontinued. After 24 hour washout, on 01/19/2013, she was started on Tikosyn per protocol. On 01/20/2013 she converted to SR after her second dose of  Tikosyn. Her QT remained stable. Unfortunately, she reverted back to AF. Tikosyn was discontinued. Today she remains in AF and rate controlled. She has been seen, examined and deemed stable for discharge by Dr. Hillis Range. She will see Dr. Kriste Basque in the next 1-2 weeks to discuss whether or not she is a candidate for AAD therapy with amiodarone. Following this, she will see Norma Fredrickson, NP to discuss treatment options and possibly start amiodarone if okay with Pulmonary.     Discharge Vitals: Blood pressure 159/84, pulse 82, temperature 97.7 F (36.5 C), temperature source Oral, resp. rate 16, height 5' (1.524 m), weight 202 lb 13.2 oz (92 kg), SpO2 98.00%.   Labs: Lab Results  Component Value Date   WBC 13.2* 01/15/2013   HGB 13.0 01/15/2013   HCT 40.4 01/15/2013   MCV 100.0 01/15/2013   PLT 261 01/15/2013     Recent Labs Lab 01/21/13 0541  NA 141  K 3.9  CL 104  CO2 29  BUN 25*  CREATININE 0.76  CALCIUM 9.6  GLUCOSE 96   Lab Results  Component Value Date   CKTOTAL 64 09/28/2010   CKMB 3.7 09/28/2010   TROPONINI <0.30 01/16/2013   Physical Exam: Filed Vitals:   01/20/13 2024 01/21/13 0517 01/21/13 0954 01/21/13 1035  BP: 134/78 159/84  146/80  Pulse: 75 82  84  Temp: 97.3 F (36.3 C) 97.7 F (36.5 C)    TempSrc: Oral Oral    Resp: 20 16    Height:      Weight:  202 lb 13.2 oz (92 kg)    SpO2: 97% 98% 98%     GEN- The patient is well appearing,  alert and oriented x 3 today.   Head- normocephalic, atraumatic Eyes-  Sclera clear, conjunctiva pink Ears- hearing intact Oropharynx- clear Neck- supple,  Lungs- Clear to ausculation bilaterally, normal work of breathing Heart- irregular rate and rhythm,   GI- soft, NT, ND, + BS Extremities- no clubbing, cyanosis, or edema   Disposition:  The patient is being discharged in stable condition.  Follow-up:     Follow-up Information   Follow up with Michele Mcalpine, MD On 02/01/2013. (At 2:30 PM)    Specialty:   Pulmonary Disease   Contact information:   [address] [place] [address] [phone]       Follow up with Norma Fredrickson, NP On 02/10/2013. (At 11:00 AM )    Specialty:  Nurse Practitioner   Contact information:   [address] [place] [address] [phone]      Discharge Medications:    Medication List    STOP taking these medications       valsartan-hydrochlorothiazide 160-12.5 MG per tablet  Commonly known as:  DIOVAN-HCT     verapamil 360 MG 24 hr capsule  Commonly known as:  VERELAN PM      TAKE these medications       albuterol 108 (90 BASE) MCG/ACT inhaler  Commonly known as:  PROVENTIL HFA;VENTOLIN HFA  Inhale 2 puffs into the lungs every 6 (six) hours as needed for wheezing.     albuterol (2.5 MG/3ML) 0.083% nebulizer solution  Commonly known as:  PROVENTIL  Take 2.5 mg by nebulization every 6 (six) hours as needed for wheezing or shortness of breath.     ALIGN PO  Take 1 tablet by mouth daily.     ALPRAZolam 0.25 MG tablet  Commonly known as:  XANAX  Take 0.25 mg by mouth 3 (three) times daily as needed for anxiety.     ANORO ELLIPTA 62.5-25 MCG/INH Aepb  Generic drug:  Umeclidinium-Vilanterol  Inhale 1 puff into the lungs daily.     BAYER MICROLET LANCETS lancets  Use as instructed once daily     Biotin 5000 MCG Tabs  Take 1 tablet by mouth daily.     budesonide 0.5 MG/2ML nebulizer solution  Commonly known as:  PULMICORT  Take 0.5 mg by nebulization daily. Takes along with pulmo mist     co-enzyme Q-10 30 MG capsule  Take 30 mg by mouth 3 (three) times daily.     DEXILANT 60 MG capsule  Generic drug:  dexlansoprazole  Take 60 mg by mouth daily.     dextromethorphan-guaiFENesin 30-600 MG per 12 hr tablet  Commonly known as:  MUCINEX DM  Take 1 tablet by mouth every 12 (twelve) hours.     diltiazem 240 MG 24 hr capsule  Commonly known as:  CARDIZEM CD  Take 1 capsule (240 mg total) by mouth daily.     ELIQUIS  5 MG Tabs tablet  Generic drug:  apixaban  take 1 tablet by mouth twice a day     famotidine 20 MG tablet  Commonly known as:  PEPCID  Take 20 mg by mouth 2 (two) times daily.     FENOGLIDE 120 MG Tabs  Generic drug:  Fenofibrate  Take 1 tablet by mouth daily.     furosemide 20 MG tablet  Commonly known as:  LASIX  Take 2 tablets (40 mg total) by mouth daily.     glucose blood test strip  Commonly known as:  BAYER CONTOUR TEST  Use as instructed once daily     irbesartan 300 MG tablet  Commonly known as:  AVAPRO  Take 1 tablet (300 mg total) by mouth daily.     metFORMIN 500 MG tablet  Commonly known as:  GLUCOPHAGE  take 1 tablet by mouth two times daily     methylPREDNISolone 4 MG tablet  Commonly known as:  MEDROL DOSEPAK  Take 4 mg by mouth. follow package directions     mometasone 50 MCG/ACT nasal spray  Commonly known as:  NASONEX  Place 2 sprays into the nose daily.     MULTIVITAMIN PO  Take by mouth. Once a day     nystatin 100000 UNIT/ML suspension  Commonly known as:  MYCOSTATIN  Take 5 mLs (500,000 Units total) by mouth as needed.     UNABLE TO FIND  Oxygen 1.5 L nightly     VITAMIN B 12 PO  Take by mouth. 1000 mcg once a day     Vitamin D (Ergocalciferol) 50000 UNITS Caps capsule  Commonly known as:  DRISDOL  Take 1 capsule (50,000 Units total) by mouth every 7 (seven) days.       Duration of Discharge Encounter: Greater than 30 minutes including physician time.  Signed, Rick Duff, PA-C 01/21/2013, 8:58 AM   Pt presents with symptomatic afib.  She has failed medical therapy this admission with flecainide and tikosyn.  Her only other AAD option is really amiodarone.  I am reluctant to use amiodarone in the setting of her lung disease without discussing with Dr Kriste Basque first.  I will therefore rate control at this point and have her follow-up with Dr Kriste Basque in 2 weeks. If Dr Kriste Basque feels that amiodarone is reasonable then this could be  started when she is seen by Norma Fredrickson in the next few weeks.  She would be a poor candidate for ablation.  If she is not a candidate for amiodarone then rate control may be our only real option.  Hillis Range MD

## 2013-02-01 ENCOUNTER — Encounter: Payer: Self-pay | Admitting: Pulmonary Disease

## 2013-02-01 ENCOUNTER — Ambulatory Visit (INDEPENDENT_AMBULATORY_CARE_PROVIDER_SITE_OTHER): Payer: Medicare Other | Admitting: Pulmonary Disease

## 2013-02-01 VITALS — BP 136/84 | HR 92 | Temp 97.8°F | Ht 60.0 in | Wt 202.0 lb

## 2013-02-01 DIAGNOSIS — M899 Disorder of bone, unspecified: Secondary | ICD-10-CM

## 2013-02-01 DIAGNOSIS — C3490 Malignant neoplasm of unspecified part of unspecified bronchus or lung: Secondary | ICD-10-CM

## 2013-02-01 DIAGNOSIS — M545 Low back pain, unspecified: Secondary | ICD-10-CM

## 2013-02-01 DIAGNOSIS — J449 Chronic obstructive pulmonary disease, unspecified: Secondary | ICD-10-CM

## 2013-02-01 DIAGNOSIS — I10 Essential (primary) hypertension: Secondary | ICD-10-CM

## 2013-02-01 DIAGNOSIS — J4489 Other specified chronic obstructive pulmonary disease: Secondary | ICD-10-CM

## 2013-02-01 DIAGNOSIS — E785 Hyperlipidemia, unspecified: Secondary | ICD-10-CM

## 2013-02-01 DIAGNOSIS — K219 Gastro-esophageal reflux disease without esophagitis: Secondary | ICD-10-CM

## 2013-02-01 DIAGNOSIS — C679 Malignant neoplasm of bladder, unspecified: Secondary | ICD-10-CM

## 2013-02-01 DIAGNOSIS — E042 Nontoxic multinodular goiter: Secondary | ICD-10-CM

## 2013-02-01 DIAGNOSIS — I4891 Unspecified atrial fibrillation: Secondary | ICD-10-CM

## 2013-02-01 DIAGNOSIS — F411 Generalized anxiety disorder: Secondary | ICD-10-CM

## 2013-02-01 DIAGNOSIS — E119 Type 2 diabetes mellitus without complications: Secondary | ICD-10-CM

## 2013-02-01 DIAGNOSIS — M1990 Unspecified osteoarthritis, unspecified site: Secondary | ICD-10-CM

## 2013-02-01 DIAGNOSIS — E669 Obesity, unspecified: Secondary | ICD-10-CM

## 2013-02-01 DIAGNOSIS — J45909 Unspecified asthma, uncomplicated: Secondary | ICD-10-CM

## 2013-02-01 DIAGNOSIS — I872 Venous insufficiency (chronic) (peripheral): Secondary | ICD-10-CM

## 2013-02-01 NOTE — Progress Notes (Signed)
Subjective:    Patient ID: [name], female    DOB: [birth year], 78 y.o.   MRN: [id number]  HPI 78 y/o WF here for a follow up visit... she has mult medical specialists who follow all of her medical problems (see below)...  ~  October 31, 2011:  92mo ROV & [name] states she decided to "get healthy" & hired a Systems analyst but during her 1st session she developed an irreg tachyarrhythmia & was Dx w/ AFib & rvr> seen by [employer] & DrAllred & treated w/ Eliquis5Bid, Metoprolol25-1/2Bid, along w/ her Verelan & DiovanHCT;  2DEcho showed modLVH, norm LVF w/ EF=55-60%, no regional wall motion abn, mildMR, mildLAdil, no ASD/PFO, PAsys=31;  Now improved & feeling better...    She was also being evaluated by DrGessner for nausea x18mo> she states nothing helps- on Zofran, Align, etc;  He did EGD/ Colon 6/13 w/ gastric polyps (hyperplastic), colon polyps (largest 45mm= tub adenoma), mild divertics, int hems;  He did AbdSonar- normal GB, no dilatation, +hepatic steatosis; Then CT Abd showed a decompressed GB, some wall thickening ?inflamm, and other findings like extensive atherosclerotic dis in abd, sm umbil hernia, & severe pelvic organ prolapse; finally HIDA scan normal; she tells me she has appt w/ DrGerkin to consider GB surg...    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 2) History of stage IA (T1, N0, MX) non-small cell lung cancer, adenocarcinoma with bronchoalveolar features diagnosed in November 2005 involving the right upper lobe. Prior Therapy: 1) Status post right upper lobectomy on January 03, 2004. 2) Status post wedge resection of the left upper lobe on February 06, 2009 under the care of Dr. Edwyna Shell. Current Therapy:  Observation    We reviewed prob list, meds, xrays  and labs> see below for updates >> she will get the 2013 flu shot at [facility]...  ~  April 30, 2012:  92mo ROV & [name] has had a lot going on> saw TP 1/14 w/ resp exac c/o incr SOB, cough, wheezing, tightness off & on for 24mo; took Avelox=> Augmentin, Depo, Pred, Mucinex, Pepcid, Hydromet, etc;  Then she had OV 3/14 w/ DrWert w/ barking cough, hoarseness, etc> he stopped her Advair & changed to Dulera100, gave her Depo120 & stressed Pepcid & antireflux regimen...  She ret for 1wk f/u after changes by DrWert but she is no better, upset that the depo didn't help this time but true-to-form she has adjusted her own meds & we discussed regimen & need for regular dosing of all meds- Dulera100-2spBid, Spiriva daily, Mucinex-2Bid, fluids, along w/ her antireflux regimen, elev HOB, Pepcid Bid, etc...    COPD w/ revers component> on Dulera100 now but only using it once/d, Spiriva, Mucinex, & Proventil HFA; asked (again) to maximize her regimen w/ Dulera100-2spBid, Mucinex2Bid, Fluids, etc; last PFT w/ FEV1=1.08 stable; see below...    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 NOTE: element of lung restriction from surg & obesity...    HBP> on Metoprolol25-1/2Bid, DiovanHCT160-12.5, Verapamil360, & Lasix20 prn; BP= 140/60 & similar at home; she has mult somatic complaints...    PAFib> She  had recurrent PAF 9/13 while exercising; seen by [facility] & treated w/ Eliquis5Bid, Metoprolol added, along w/ her Verelan & DiovanHCT; maintaining NSR & denies CP, palpit, dizzy, etc; tolerating Rx well- no changes made; he did Myoview 10/13 which was neg- no ischemia, normal wall motion, EF=74%...     Ven Insuffic> she knows to elim sodium but she eats out often, elev legs, wear support hose; she has been taking the Lasix20mg   daily due to the swelling & improved...     Hyperlipid> off prev Fenoglide & on FishOil, CoQ10, diet; she states INTOL to all statins; FLP 3/14 shows TChol 196, TG 84, HDL 46, LDL 133    DM> on [id number]; labs 3/14 shows BS= not done, A1c=6.9 (stable); she is advised to continue same, get on diet & get weight down...    Obesity> wt=203# which is up 5# further; We reviewed low carb, low fat, wt reducing diet & exercise prescription...    Multinod Goiter> prev eval from [facility] DrGerkin; TFTs showed TSH=0.98, and prev FreeT3/ FreeT4 were wnl...    GI> GERD, Polyps> on Prilosec/Pepcid but won't take them regularly & has hx episode of choking on dinner w/ resp exac- see prev speech path eval; followed by DrGessner for GI w/ EGD/Colon 6/13 showing mult hyperplastic polyps in stomach, & Colon w/ divertics, AVM, int hems, & numerous sm polyps (max85mm)- tub adenomas w/ repeat planned 37yr...    GU> Bladder Cancer> Papilllary TCCa resected 2001 & no recurrence; followed by DrPeterson w/ yearly cystos neg...    DJD/ LBP> on musc relaxer & Advil; prev rotator cuff surg 1991, left TKR 2002, now followed by DrRamos for LBP & plans shot in back (she reports it's improved spontaneously)...    Osteopenia> prev on Actonel (she stopped on her own), calcium, MVI, Vit D; ?when her last BMD was done?    Anxiety> as noted- she has Alpraz0.25mg  for prn use but doesn't take it often despite being asked to take it Tid regularly... We reviewed prob list, meds, xrays and labs> see below for updates >> prolonged OV requiring >28min review of data & discussion w/ pt... LABS 3/14:  FLP- improved but LDL=133 on diet alone, refuses meds;  Chems- not done as requested just LFTs=wnl;  TSH=0.98;  A1c=6.9   ~  May 07, 2012:  1wk ROV & add-on at pt request ("DrMohammed told me to come") stating no better w/ cough, congestion, SOB, and specifically notes sore mouth & tongue from the [facility] (she c/o this same complic in past when tried  in 2012);  We reviewed her fixed obstructive disease & reactive airways dis component- and the need for regular dosing of all meds which address the mult components of her lung problem (ie- COPD, revers component, reflux, restriction, anxiety) but she is only concerned w/ not missing a trip to the beach this weekend...  We decided to check PFT, give her a NEB treatment, and recheck PFT (not signif reversibility found)...  We will stop the [facility], change to Symbicort160-2spBid via spacer & treat sore mouth w/ MMW; she wants Medrol dosepak to take to the beach...  Finally I have recommended allergy/immunology eval from DrKozlow for his input ...    PFT pre-bronchodil>> FVC=1.83 (72%), FEV1=0.74 (39%), %1sec=41, mid-flows=16% predicted...    Given NEB Rx w/ Albuterol...    Repeat PFT post-bronchdil>> FVC=1.68 (67%), FEV1=0.87 (46%), %1sec=51, mid-flows=19% predicted...    O2 sat= 93% on RA at rest, and only dropped to 92% on RA  after 1lap in office w/ HR=100... She had 18mo f/u appt w/ DrMohammed 05/06/12 for f/u of her lung cancers> CT Chest 05/04/12 showed s/p bilat upper lobe surg w/ post-op scarring, stable 4mm ground-glass nodule in ant left lung w/o change, atherosclerosis of Ao & coronaries, heterogeneous thyroid, no adenopathy... They plan another f/u in 18mo. 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 2) History of stage IA (T1, N0, MX) non-small cell lung cancer, adenocarcinoma with bronchoalveolar features diagnosed in November 2005 involving the right upper lobe. Prior Therapy: 1) Status post right upper lobectomy on January 03, 2004. 2) Status post wedge resection of the left upper lobe on February 06, 2009 under the care of Dr. Edwyna Shell. Current Therapy:  Observation  ~  July 14, 2012:  92mo ROV & [name] reports that she is better- best she's been in several months, on NEBS per DrKozlow ("I really like him"), +QVar via Aerochamber, had ONO &  started on Oxygen 1.5L Qhs (she checks her own oximetry now); despite her improvement- she still wants a Depo shot prior to her upcoming trip "very active & I don't want to miss a thing";  DrKozlow sent her to [facility] for ENT eval & this was neg per pt...     We reviewed prob list, meds, xrays and labs> see below for updates >>    ~  September 30, 2012:  2-218mo ROV & add-on per DrGessner due to edema> pt states that when she saw DrGessner 8/25 she was very swollen & it has resolved over the past 2d "it's a miracle" she says; GI decided to forgo any f/u colonoscopy at this time... She has also been experiencing elev BS that has not responded to her Metform500/d... We reviewed the following medical problems during today's office visit >>     COPD w/ revers component> very severe dis w/ FEV1<1L; DrKozlow has been attempting to maximize her bronchodil meds but frequent med changes have been difficult for pt & she is confused; last note 7/14 indicates she was doing OK but w/ signif DOE & he did ONO w/ desat<88% for 8.5% of the time=> started on noct O2 at 1.5L/min; he switched her to Perforomist/ Budesonide via NEBS Bid +Spiriva daily...    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 Note: element of superimposed restrictive dis due to 2 surgeries...    HBP> on DiovanHCT160-12.5, Verapamil360, & Lasix20; BP= 130/66 & similar at home; she has mult somatic complaints; recent incr edema lead to regular Lasix dosing- therefore we will elim the HCT & use plain Diovan160; reminded to elim salt/ sodium...     PAFib> She had recurrent PAF 9/13 while exercising; seen by [facility] & treated w/ Eliquis5Bid & meds  adjusted; maintaining NSR & denies CP, palpit, dizzy, etc; he did Myoview 10/13 which was neg- no ischemia, normal wall motion, EF=74%; she had f/u DrAllred 7/14> she was not compliant w/ Eliquis Bid- encouraged to take it regularly but she has continued to take it just once daily!...    Ven Insuffic> she knows to elim sodium (but she eats out often), elev legs, wear support hose; she has been taking the Lasix20mg  daily & improved.Marland KitchenMarland Kitchen  Hyperlipid> on Feno120 & on FishOil, CoQ10, diet; she states INTOL to all statins; FLP 3/14 shows TChol 196, TG 84, HDL 46, LDL 133    DM> on [id number]; she notes BS at home in the 200-300 range now; Labs 8/14 showed BS=227, A1c=7.4 & rec to increase Metform500Bid & add Glimep2mg Qam.    Obesity> wt=200# which is about the same; We reviewed low carb, low fat, wt reducing diet & exercise prescription...    Multinod Goiter> prev eval from [facility] DrGerkin; TFTs 3/14 showed TSH=0.98, and prev FreeT3/ FreeT4 were wnl...    GI> GERD, Polyps> on Dexilant60/Pepcid (per DrKozlow) but won't take them regularly & has hx episode of choking on dinner w/ resp exac- see prev speech path eval; followed by DrGessner for GI w/ EGD/Colon 6/13 showing mult hyperplastic polyps in stomach, & Colon w/ divertics, AVM, int hems, & numerous sm polyps (max99mm)- tub adenomas w/ repeat planned 70yr but they decided to wait w/ her resp difficulties...    GU> Bladder Cancer> Papilllary TCCa resected 2001 & no recurrence; prev followed by DrPeterson w/ yearly cystos neg=> needs Urology f/u when able...    DJD/ LBP> on OTC meds prn; prev rotator cuff surg 1991, left TKR 2002, now followed by DrRamos for LBP & plans shot in back (she reports it's improved spontaneously)...    Osteopenia> prev on Actonel (she stopped on her own), calcium, MVI, Vit D; ?when her last BMD was done=> needs f/u when able...    Anxiety> as noted- she has Alpraz0.25mg  for prn use but doesn't take it often despite being asked  to take it Tid regularly... We reviewed prob list, meds, xrays and labs> see below for updates >>  LABS 8/14:  Chems- ok x BS=227, A1c=7.4, Ca=10.9.[name].  ~  February 01, 2013:  72mo ROV & [name] is feeling sl better at present; she continues her regular f/u w/ all her specialists as below & note that DrKozlow sent her to [facility] to see DrPascual- she brought a letter he sent to her indicating that her IgE level was 1259 and Allergy Rast panel pending but she would clearly be a candidate for Xolair & I have asked her to show this to DrKozlow to get started on the med at their upcoming visit...    She has seen DrBates for ENT, sinus eval, etc...    She sees DrKozlow for Allergies & Asthma second opinion w/ regular f/u visits and med adjustments... Note that she was allergy tested by drLeBauer yrs ago & took allergy shots x yrs but stopped on her own...    Current meds are difficult to follow due to freq changes by DrKozlow & the fact that she NEVER brings an up to date med list- eg. NEBS w/ Perforamist/ Budesonide Bid, Albuterol prn, changed to Onoro, then ?back; Oxygen 1.5L/min flow Qhs; Nasonex, Mucinex, etc...     She had Cards eval by DrTaylor & Allred w/ AFib & records reviewed> s/p DCCV, didn't hold and meds adjusted...     Current meds in Epic include: CardizemCD240, Avapro300, Lasix20-2/d, and Eliquis 5Bid...     DM meds include Metform500Bid w/ BS 100-180 in dec...     GI meds include Dexilant60, Pepcid20Bid, Align daily...  We reviewed prob list, meds, xrays and labs> see below for updates >> she had the 2014 Flu vaccine in October...          Problem List:      ASTHMA (ICD-493.90) / COPD (ICD-496) - ex-smoker, quit  1997... she was participating in [facility] (last 3/09) & stopped on her own... may have a superimposed component of restriction due to obesity & prev lung surgeries... ~  11/05: s/p RULobectomy for lung ca- (adeno w/ bronchoalveolar cell features). ~  PFT 8/08 showed FVC=2.02  (77%), FEV1=1.07 (51%), %1sec=53, mid-flows=19%pred. ~  PFT 7/09 today= FVC=1.93 (72%), FEV1=1.04 (49%), %1sec=54, mid-flows=19%pred. ~  1/11:  s/p LUL resection by DrBurney> multicentric bronchoalveolar cell cancer. ~  PFT 8/12 showed FVC=2.25 (88%), FEV1=1.08 (56%), %1sec=48, mid-flows=30% pred. ~  10/12:  She reports Dulera caused sore mouth so she switched back to Advair500 but asked to do this Bid regularly. ~  2013:  She won't stick w/ any therapy regularly & continues to decr her dosing on her own despite freq exacerbations... ~  3/14:  Now on Dulera100 per DrWert (despite prev hx of sore mouth) but only using it once/d, Spiriva, Mucinex, & Proventil HFA; asked (again) to maximize her regimen w/ Dulera100-2spBid, Mucinex2Bid, Fluids, etc; last PFT w/ FEV1=1.08 stable... ~  4/14:  Add-on appt c/o no better & sore mouth from the [facility]; treat w/ MMW & change to Symbicort160-2spBid via spacer; she requests MedrolDosepak- ok; we discussed referral to DrKozlow for his input> she has fixed obstruction, revers component, restrictive component (surg & obese), reflux component, & anxiety... ~  PFTs 4/14 showed>  pre-bronchodil>> FVC=1.83 (72%), FEV1=0.74 (39%), %1sec=41, mid-flows=16% predicted...  post-bronchdil>> FVC=1.68 (67%), FEV1=0.87 (46%), %1sec=51, mid-flows=19% predicted. ~  4/14:  She had eval by DrKozlow & he is following regularly w/ freq med changes that has her confused- she is encouraged to call his office for clarification... ~  8/14: very severe dis w/ FEV1<1L; DrKozlow has been attempting to maximize her bronchodil meds but frequent med changes have been difficult for pt & she is confused; last note 7/14 indicates she was doing OK but w/ signif DOE & he did ONO w/ desat<88% for 8.5% of the time=> started on noct O2 at 1.5L/min; he switched her to Perforomist/ Budesonide via NEBS Bid +Spiriva daily... ~  She tells me that drKozlow sent her to DrPascual at [facility] we do not have his results  but she has a letter indicating that IgE=1259 & RAST panel pending; she will discuss poss Xolaire w/ DrKozlow in up coming appt...  Hx of LUNG CANCER (ICD-162.9) - Hx multicentric bronchoalveolar cell lung cancer >> followed by DrBurney for CVTS & DrMohammed for Oncology... ~  s/p right upper lobectomy by DrBurney 11/05 for a stage 1A non-small cell lung cancer (adenocarcinoma w/ bronchalveolar cell features); post-op observation only... ~  CT Chest 11/08 showed no recurrence, mult bilat nodules unchanged x3+ yrs, nodular thyroid w/o change... ~  CXR 7/09 showed stable post-op changes and scarring on the right, NAD.[name]. ~  CTAngio Chest 10/09 showed neg for PE, prom thyroid, atherosclerotic changes in Ao, no change in ground-glass nodules in LUL area... ~  CT Chest 11/10 by DrMohammed showed new LUL solid nodule, no change in ground-glass areas... lesion was PET pos... ~  1/11:  s/p LULobectomy & node dissection via minithoracotomy by DrBurney- path showed 3 foci of well diff bronchoalveolar cell ca & neg nodes... decision made at conference for no chemoRx, EGFR assay was neg... ~  she continues to have monitoring by DrBurney/ DrMohammed> on observation alone now; note from DrMohammed 9/12 indicated she was stable & he plans f/u CT Chest & OV in 103mo... ~  CT Chest 9/12 per DrMohammed showed stable ?9mm ground  glass opac LUL w/o change from prev; +post op changes, COPD, coronary calcif, & hep steatosis... ~  CT Chest 9/13 per DrMohammed showed 4mm ground glass nodule ant LUL, postop bilat scarring, normal heart size w/ calcif Ao & coronaries, no adenopathy... ~  CT Chest 05/04/12 showed s/p bilat upper lobe surg w/ post-op scarring, stable 4mm ground-glass nodule in ant left lung w/o change, atherosclerosis of Ao & coronaries, heterogeneous thyroid, no adenopathy... ~  She has appt for f/u CT Chest and visit w/ DrMohammed 10/14...  HYPERTENSION (ICD-401.9) >>  ~  Cardiac eval 9/09 by Walker Kehr was neg and  2DEcho showed DD w/ norm LVsys function, EF= 60-65%, no regional wall motion abn... ~  3/13:  BP= 122/72 & feeling OK, tolerating Rx (DiovHct 160/12.5 & Verelan240)... denies CP, palipit, dizziness, syncope, ch in dyspnea, edema, etc...  ~  9/13:  BP= 114/62 & meds adjusted in the interval w/ DiovHct 160/12.5 & Verelan360 (plus Metop25-1/2Bid now)... ~  3/14:  BP= 140/60 & similar at home; she has mult somatic complaints as noted... ~  6/14:  BP= 122/70 & similar at home; no change in her chronic symptoms; Metoprolol removed by Cards... ~  8/14:  on DiovanHCT160-12.5, Verapamil360, & Lasix20; BP= 130/66 & similar at home; she has mult somatic complaints; recent incr edema lead to regular Lasix dosing- therefore we will elim the HCT & use plain Diovan160; reminded to elim salt/ sodium.  ~  12/14:  meds all changed by Cards due to recurrent AFib, brief hosp for DCCV that didn't hold, now taking CardizemCD240, Avapro300, Lasix20-2/d, and Eliquis 5Bid.   PAROXYSMAL ATRIAL FIBRILLATION (ICD-427.31) - hx PAF in the post op period... converted to NSR & holding... transiently on Amiodarone in past & she wanted off Rx.  ~  Developed AFib w/ rvr 9/13 & eval by DrAllred> treated w/ Eliquis5Bid, Metoprolol25-1/2Bid, along w/ her Verelan & DiovanHCT... ~  2DEcho 9/13 showed modLVH, norm LVF w/ EF=55-60%, no regional wall motion abn, mildMR, mildLAdil, no ASD/PFO, PAsys=31 ~  Myoview 10/13 was NEG- no ischemia, normal wall motion, EF=74% ~  3/14:  She decreased the Metop & Eliquis to once daily on her own & was asked by Cards to take both Bid as directed... ~  8/14:  She had recurrent PAF 9/13 while exercising; seen by [facility] & treated w/ Eliquis5Bid & meds adjusted; maintaining NSR & denies CP, palpit, dizzy, etc; he did Myoview 10/13 which was neg- no ischemia, normal wall motion, EF=74%; she had f/u DrAllred 7/14> she was not compliant w/ Eliquis Bid- encouraged to take it regularly but she has continued  to take it just once daily![name]..  ~  12/14:  meds all changed by Cards due to recurrent AFib, brief hosp for DCCV that didn't hold, now taking CardizemCD240, Avapro300, Lasix20-2/d, and Eliquis 5Bid.   PERIPHERAL VASCULAR DISEASE (ICD-443.9) - she has atherosclerotic changes in her Ao (& coronaries) noted on her prev scans...  ~  11/10: pt had mult questions about this problem on the prob list- discussed "hardening of the arteries" in detail.  VENOUS INSUFFICIENCY (ICD-459.81) - she knows to follow a low sodium diet, elevate legs, wear support hose, etc; she insists on keeping Lasix 20mg  on hand for Prn use...  HYPERLIPIDEMIA (ICD-272.4) - prev on Livolo 2mg - 1/2 tab daily (stopped due to aching), +FENOGLIDE 120mg /d, FISH OIL & CoQ10  supplements... prev on Vytorin but INTOL ==> she states INTOL to all statins... she was not satis w/ the Lipid Clinic  in the past. ~  labs 8/08 off Vytorin showed TChol 183, TG 207, HDL 46, LDL 112... try fenofibrate... ~  FLP 5/09 on Feno120 showed TChol 188, TG 111, HDL 28, LDL 137... cont same, better diet, get wt down! ~  FLP 4/10 on Feno120 showed TChol 240, TG 104, HDL 49, LDL 165... I rec f/u Lipid Clinic, she declined. ~  FLP 7/10 on Feno120 showed TChol 222, TG 152, HDL 36, LDL 172... rec> try LIVALO 2mg - 1/2 tab Qhs, she stopped. ~  FLP 11/10 on Feno120+FishOil showed TChol 253, TG 125, HDL 42, LDL 208... try LIVALO2mg - 1/2 tab/d & stay on it. ~  FLP 1/11 on Liv1mg +Feno120 showed TChol 170, TG 101, HDL 45, LDL 105... continue same. ~  3/11: she reports aching all over & DrBurney stopped the Livolo> contin diet + other meds. ~  FLP 8/11 showed TChol 225, TG 238, HDL 41, LDL 156... INTOL all statins, she'll do the best she can w/ diet. ~  FLP 8/12 in hosp showed TChol 234, TG 276, HDL 52, LDL 127... Counseled on low chol, low fat, wt reducing diet. ~  FLP 3/13 on Feno120 showed TChol 221, TG 85, HDL 56, LDL 159... She refuses Statin Rx or Lipid Clinic  referral... ~  FLP 9/13 on Feno120+diet showed TChol 183, TG 126, HDL 37, LDL 121 ~  FLP 3/14 on Feno120+diet showed TChol 196, TG 84, HDL 46, LDL 133  DIABETES MELLITUS (ICD-250.00) - started on METFORMIN 500mg Bid 4/10, but decr on her own to 1 daily 1/11... we had stressed the importance of diet- low carb/ low fat and weight reduction, along w/ her pulm rehab exercises... ~  labs 4/10 showed BS= 131, HgA1c= 7.0.Marland KitchenMarland Kitchen Metformin500Bid started. ~  labs 7/10 showed BS= 134, A1c= 6.0 ~  labs 11/10 showed BS= 148, A1c= 6.5 ~  1/11:  she cut the Metformin to 1/d due to nausea. ~  labs 8/11 showed BS= 137, A1c= 5.7.Marland KitchenMarland Kitchen continue same, get wt down. ~  Labs 8/12 in hosp on Metform500/d showed BS= 110-230, A1c=6.5 ~  Labs 10/12 on Metform500/d showed BS= 141, A1c=7.0.Marland KitchenMarland Kitchen Needs better diet, get wt down, or more meds. ~  1/13:  Ophthalmology check up by DrMcCuen> neg- no diabetic retinopathy... ~  Labs 3/13 on Metform500/d showed BS= 124, A1c= 6.7 ~  Labs 3/14 on Metform500/d showed BS= not done, A1c= 6.9 ~  8/14:  on Metform500Qam; she notes BS at home in the 200-300 range now; Labs 8/14 showed BS=227, A1c=7.4 & rec to increase Metform500Bid & add Glimep2mg Qam.  NONTOXIC MULTINODULAR GOITER (ICD-241.1) - eval and rx by DrBalan for Endocrinology & DrGerkin for CCS; she is asymptomatic; dominant nodule was needled and benign; surg consult from DrGerkin- elected observation & he checks her yearly... ~  seen 6/10 by DrGerkin- f/u sonar w/o change in any of the nodules... ~  labs 7/10 showed TSH= 0.89 ~  seen 6/11 by DrGerkin- f/u sonar w/o change in nodules. ~  Labs 8/12 showed TSH= 0.047... Not on Thyroid meds, needs f/u DrBalan (she never went). ~  Labs 10/12 showed TSH= 0.97, FreeT3= 3.2 (2.3-4.2), FreeT4= 0.88 (0.60-1.60) ~  Labs 7/13 showed TSH= 0.68 ~  Labs 3/14 showed TSH= 0.98  OBESITY (ICD-278.00) - obese w/ abd panniculus & we discussed diet + exercise strategies.[name]. ~  weight up to 205# 11/09- we  discussed diet, calorie restriction, exercise, & get the weight down... ~  weight 4/10 = 198# ~  weight 7/10 = 194# ~  weight 11/10 = 196# ~  weight 2/11 = 184# (post op) ~  weight 8/11 = 181# ~  weight 11/11 = 186#... she needs to do better! ~  Weight 8/12 = 186# ~  Weight 10/12 = 198#... What happened? ~  Weight 3/13 = 189#... Keep up the good work! ~  Weight 9/13 = 184# ~  Weight 3/14 = 203# ~  Weight 6/14 = 203# ~  Weight 8/14 = 200#  GERD (ICD-530.81) >>  ~  EDG 6/13 by DrGessner showed mult gastric polyps (hyperplastic), mod gastritis, & treated w/ Omep20mg /d (she subseq stopped on her own)... ~  4/14:  DrKozlow has tried to maximize her antireflux regimen to help her breathing as well=> on Dexilant60 & Pepcid20...  COLON POLYPS, DIVERTICULOSIS, HEMORRHOIDS >> ~  colonoscopy 7/09 by DrGessner showed 4 sm polyps= tubular adenoma on bx... f/u planned 3 yrs... ~  Colonoscopy 6/13 by DrGessner showed mult sm polyps, largest 65mm= tub adenoma, plus mild diverticvs, int hems, etc... ~  CT Abd 9/13 revealed GB to be decompressed, ?wall thickening & ?chr inflamm, extensive atherosclerosis in abd vasculature- consider CTA, sm umbil hernia, severe pelvic organ prolapse;  => HIDA scan 9/13 was neg, wnl...  She has recurrent nausea for which she has seen DrGesnner & DrGerkin...  NEPHROLITHIASIS, HX OF (ICD-V13.01)  Hx of BLADDER CANCER (ICD-188.9) - had hematuria in 2001 & referred to DrPeterson... 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 all subseq cystoscopies have been neg- no recurrence. ~  she reports f/u w/ DrPeterson yearly & doing satis by her report. ~  She hasn't seen Urology since DrPeterson retired; she denies urinary symptoms & she is encouraged to sched f/u appt w/ DrWoodruff et al...  DEGENERATIVE JOINT DISEASE (ICD-715.90) - s/p rotator cuff repair in 1991... s/p left TKR from DrGioffre in 2002...  OSTEOPENIA  (ICD-733.90) - off prev ACTONEL 150/mo for a drug holiday, ca++, MVI, etc... ~  I cannot find baseline BMD in EPIC or cone system> ?done by GYN? ~  labs 8/11 showed Vit D level = 22... rec> start Vit D supplement extra 02-1998 u daily... ~  2013:  Actonel removed from her list by DrGessner for drug holiday...  ANXIETY (ICD-300.00) - on XANAX 0.25mg  Prn... she has signif hx of claustrophobia, but denies that she has any anxiety...   Past Surgical History  Procedure Laterality Date  . S/p right rotator cuff repair  1991    Dr. Meade Maw, right  . S/p turbt  10/01    Dr. Vonita Moss  . S/p left tkr  2002    Dr. Darrelyn Hillock  . 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 S/p d&c for removal of endometrial polyp (benign)  12/10    GYN  . Colonoscopy    . Esophagogastroduodenoscopy    . Cardioversion N/A 01/17/2013    Procedure: CARDIOVERSION/BEDSIDE (4O96);  Surgeon: Marinus Maw, MD;  Location: [facility] OR;  Service: Cardiovascular;  Laterality: N/A;    Outpatient Encounter Prescriptions as of 02/01/2013  Medication Sig  . albuterol (PROVENTIL HFA;VENTOLIN HFA) 108 (90 BASE) MCG/ACT inhaler Inhale 2 puffs into the lungs every 6 (six) hours as needed for wheezing.  [name] albuterol (PROVENTIL) (2.5 MG/3ML) 0.083% nebulizer solution Take 2.5 mg by nebulization  every 6 (six) hours as needed for wheezing or shortness of breath.  . ALPRAZolam (XANAX) 0.25 MG tablet Take 0.25 mg by mouth 3 (three) times daily as needed for anxiety.  [name] BAYER MICROLET LANCETS lancets Use as instructed once daily  . Biotin 5000 MCG TABS Take 1 tablet by mouth daily.  . budesonide (PULMICORT) 0.5 MG/2ML nebulizer solution Take 0.5 mg by nebulization daily. Takes along with pulmo mist  . co-enzyme Q-10 30 MG capsule Take 30 mg by mouth 3 (three) times daily.  .  Cyanocobalamin (VITAMIN B 12 PO) Take by mouth. 1000 mcg once a day   . dexlansoprazole (DEXILANT) 60 MG capsule Take 60 mg by mouth daily.  [name] dextromethorphan-guaiFENesin (MUCINEX DM) 30-600 MG per 12 hr tablet Take 1 tablet by mouth every 12 (twelve) hours.  [name] diltiazem (CARDIZEM CD) 240 MG 24 hr capsule Take 1 capsule (240 mg total) by mouth daily.  [name] ELIQUIS 5 MG TABS tablet take 1 tablet by mouth twice a day  . famotidine (PEPCID) 20 MG tablet Take 20 mg by mouth 2 (two) times daily.   . Fenofibrate (FENOGLIDE) 120 MG TABS Take 1 tablet by mouth daily.  . furosemide (LASIX) 20 MG tablet Take 2 tablets (40 mg total) by mouth daily.  [name] glucose blood (BAYER CONTOUR TEST) test strip Use as instructed once daily  . irbesartan (AVAPRO) 300 MG tablet Take 1 tablet (300 mg total) by mouth daily.  . metFORMIN (GLUCOPHAGE) 500 MG tablet take 1 tablet by mouth two times daily  . methylPREDNISolone (MEDROL DOSEPAK) 4 MG tablet Take 4 mg by mouth. follow package directions  . mometasone (NASONEX) 50 MCG/ACT nasal spray Place 2 sprays into the nose daily.  . Multiple Vitamin (MULTIVITAMIN PO) Take by mouth. Once a day   . nystatin (MYCOSTATIN) 100000 UNIT/ML suspension Take 5 mLs (500,000 Units total) by mouth as needed.  . Probiotic Product (ALIGN PO) Take 1 tablet by mouth daily.  [name] Umeclidinium-Vilanterol (ANORO ELLIPTA) 62.5-25 MCG/INH AEPB Inhale 1 puff into the lungs daily.  [name] UNABLE TO FIND Oxygen 1.5 L nightly  . Vitamin D, Ergocalciferol, (DRISDOL) 50000 UNITS CAPS Take 1 capsule (50,000 Units total) by mouth every 7 (seven) days.    Allergies                                          Pt states INTOL to ALL STATINS...  Allergen Reactions  . Cephalexin     REACTION: nausea and diarrhea  . Codeine     REACTION: nausea  . Epinephrine     REACTION: nervous  . Erythromycin     REACTION: all mycins cause yeast infections  . Morphine     REACTION: nausea  . Sulfamethoxazole W/Trimethoprim      REACTION: nausea and diarrhea    Current Medications, Allergies, Past Medical History, Past Surgical History, Family History, and Social History were reviewed in [employer] record.    Review of Systems         See HPI - all other systems neg except as noted...  The patient complains of hoarseness, dyspnea on exertion, headaches, muscle weakness, and difficulty walking.  The patient denies anorexia, fever, weight loss, weight gain, vision loss, decreased hearing, chest pain, syncope, peripheral edema, prolonged cough, hemoptysis, abdominal pain, melena, hematochezia, severe indigestion/heartburn, hematuria, incontinence, suspicious skin lesions, transient blindness,  depression, unusual weight change, abnormal bleeding, enlarged lymph nodes, and angioedema.     Objective:   Physical Exam     WD, WN, 78 y/o  WF in NAD... GENERAL:  Alert & oriented; pleasant & cooperative... HEENT:  [place]/AT, EOM-wnl, PERRLA, EACs-clear, TMs-wnl, NOSE-clear, THROAT- no thrush, sm aphthous ulcer noted... NECK:  Supple w/ fairROM; no JVD; normal carotid impulses w/o bruits; no thyromegaly or nodules palpated; no lymphadenopathy. CHEST:  Decr BS bilat; scat wheezing/ rhonchi bilat, no rales or consolidation; s/p VATS surg scar on right & mini thoracotomy scar on left... sl tender to palp left chest wall... HEART:  Regular Rhythm; without murmurs/ rubs/ or gallops heard... ABDOMEN:  Obese w/ panniculus, soft & nontender; normal bowel sounds; no organomegaly or masses detected. EXT:  S/P left TKR; mild arthritic changes; no varicose veins/ +venous insuffic/ tr edema. NEURO:  CN's intact;  no focal neuro deficits... sl tender over right max sinus. DERM:  No lesions noted; no rash etc...  RADIOLOGY DATA:  Reviewed in the EPIC EMR & discussed w/ the patient...  LABORATORY DATA:  Reviewed in the EPIC EMR & discussed w/ the patient...   Assessment & Plan:    COPD/ AB/ restriction from 2  surgeries>  She has severe airflow obstruction & superimposed restriction from her prev surg + obesity; Eval & Rx adjusted by DrKozlow but she has deteriorated; she is confused about his recent changes but we don't have the recent note yet- rec to contact his office for clarification... ~  12/14: [facility] showed IgE level = 1259 7 RAST tests pending; she will f/u w/ drKozlow regarding Xolair shots...   Hx Lung Cancer w/ 2 prev surgeries>  Followed regularly by DrBurney (now retired), DrMohammed; last CT Chest 3/14 was stable...  HBP>  Controlled on Diovan/HCT, Verapamil, & Lasix;  We will adjust the Diovan & remove the HCT in light of the need to contnue the Lasix...  AFIB>  PAF episode treated by DrAllred w/ Eliquis & above meds, this was recurrent 12/14 & they attempted DCCV...  HYPERLIPID>  She is INTOL to all statins, on diet/ exercise/ FishOil; recent FLP remains fair & she will attempt to improve diet & lose weight...  DM>  Control is worse w/ Metformin monotherapy & BS=227 w/ A1c up to 7.4 now;  Worsening numbers partly due to her consumption of Pred & her weight etc;  Advised to minimize steroid rx & adjust DM meds- Metform500Bid + Glimep2mg /d...  Multinod Thyroid>  She never did call DrBalan for f/u; repeat labs show that sh is still biochem euthyroid & she has seen DrGerkin...  Obesity>  Diet + exercise are the keys here...  GERD>  Prev MBS by speech path w/o asp or penetration; she needs to eat more slowly, chew well, swallow carefully; rec to continue Prilosec, elev HOB, & NPO after dinner... Nausea & GB eval/ EGD/ etc per DrGessner & his notes reviewed...  Mult Colon Polyps>  See 6/13 colonoscopy per DrGessner...  DJD/ Osteopenia>  On calcium, MVI, VitD; off Actonel & on drug holiday=> she will need f/u BMD...  Anxiety>  Prev on Xanax as needed, & doesn't think she needs a nerve pill; we discussed trial of KLONOPIN 0.5mg  bid to help her breathing...   Patient's Medications   New Prescriptions   No medications on file  Previous Medications   ALBUTEROL (PROVENTIL HFA;VENTOLIN HFA) 108 (90 BASE) MCG/ACT INHALER    Inhale 2 puffs into the lungs every 6 (  six) hours as needed for wheezing.   ALBUTEROL (PROVENTIL) (2.5 MG/3ML) 0.083% NEBULIZER SOLUTION    Take 2.5 mg by nebulization every 6 (six) hours as needed for wheezing or shortness of breath.   ALPRAZOLAM (XANAX) 0.25 MG TABLET    Take 0.25 mg by mouth 3 (three) times daily as needed for anxiety.   BAYER MICROLET LANCETS LANCETS    Use as instructed once daily   BIOTIN 5000 MCG TABS    Take 1 tablet by mouth daily.   BUDESONIDE (PULMICORT) 0.5 MG/2ML NEBULIZER SOLUTION    Take 0.5 mg by nebulization daily. Takes along with pulmo mist   CO-ENZYME Q-10 30 MG CAPSULE    Take 30 mg by mouth 3 (three) times daily.   CYANOCOBALAMIN (VITAMIN B 12 PO)    Take by mouth. 1000 mcg once a day    DEXLANSOPRAZOLE (DEXILANT) 60 MG CAPSULE    Take 60 mg by mouth daily.   DEXTROMETHORPHAN-GUAIFENESIN (MUCINEX DM) 30-600 MG PER 12 HR TABLET    Take 1 tablet by mouth every 12 (twelve) hours.   DILTIAZEM (CARDIZEM CD) 240 MG 24 HR CAPSULE    Take 1 capsule (240 mg total) by mouth daily.   ELIQUIS 5 MG TABS TABLET    take 1 tablet by mouth twice a day   FAMOTIDINE (PEPCID) 20 MG TABLET    Take 20 mg by mouth 2 (two) times daily.    FENOFIBRATE (FENOGLIDE) 120 MG TABS    Take 1 tablet by mouth daily.   FUROSEMIDE (LASIX) 20 MG TABLET    Take 2 tablets (40 mg total) by mouth daily.   GLUCOSE BLOOD (BAYER CONTOUR TEST) TEST STRIP    Use as instructed once daily   IRBESARTAN (AVAPRO) 300 MG TABLET    Take 1 tablet (300 mg total) by mouth daily.   METFORMIN (GLUCOPHAGE) 500 MG TABLET    take 1 tablet by mouth two times daily   METHYLPREDNISOLONE (MEDROL DOSEPAK) 4 MG TABLET    Take 4 mg by mouth. follow package directions   MOMETASONE (NASONEX) 50 MCG/ACT NASAL SPRAY    Place 2 sprays into the nose daily.   MULTIPLE VITAMIN (MULTIVITAMIN  PO)    Take by mouth. Once a day    NYSTATIN (MYCOSTATIN) 100000 UNIT/ML SUSPENSION    Take 5 mLs (500,000 Units total) by mouth as needed.   PROBIOTIC PRODUCT (ALIGN PO)    Take 1 tablet by mouth daily.   UMECLIDINIUM-VILANTEROL (ANORO ELLIPTA) 62.5-25 MCG/INH AEPB    Inhale 1 puff into the lungs daily.   UNABLE TO FIND    Oxygen 1.5 L nightly   VITAMIN D, ERGOCALCIFEROL, (DRISDOL) 50000 UNITS CAPS    Take 1 capsule (50,000 Units total) by mouth every 7 (seven) days.  Modified Medications   Modified Medication Previous Medication   CLOTRIMAZOLE (MYCELEX) 10 MG TROCHE clotrimazole (MYCELEX) 10 MG troche      DISSOLVE 1 TABLET IN MOUTH 5 TIMES A DAY FOR 7 DAYS.    1 troche 5 times a day x 7 days   PROMETHAZINE (PHENERGAN) 12.5 MG TABLET promethazine (PHENERGAN) 12.5 MG tablet      take 2 tablets by mouth every 6 hours if needed for nausea    Take 2 tablets (25 mg total) by mouth every 6 (six) hours as needed for nausea.   TRAMADOL (ULTRAM) 50 MG TABLET traMADol (ULTRAM) 50 MG tablet      take 1 tablet by mouth three times  a day if needed    Take 1 tablet (50 mg total) by mouth 3 (three) times daily as needed.  Discontinued Medications   No medications on file

## 2013-02-01 NOTE — Patient Instructions (Signed)
Today we updated your med list in our EPIC system...    Continue your current medications the same...  You need a follow up appt w/ drKozlow regarding the elev IgE level and possible allergy testing & consideration of Xolair treatment...  You need a follow up visit w/ Cardiology regarding your Atrial fib as well...  Let's plan a follow up here early in April w/ FASTING blood work prior to that visit...  Call for any questions.Marland KitchenMarland Kitchen

## 2013-02-02 ENCOUNTER — Other Ambulatory Visit: Payer: Self-pay | Admitting: Ophthalmology

## 2013-02-03 ENCOUNTER — Other Ambulatory Visit: Payer: Self-pay | Admitting: Pulmonary Disease

## 2013-02-03 ENCOUNTER — Other Ambulatory Visit: Payer: Self-pay | Admitting: Adult Health

## 2013-02-03 ENCOUNTER — Telehealth: Payer: Self-pay | Admitting: Pulmonary Disease

## 2013-02-03 NOTE — Telephone Encounter (Signed)
lmtcb  Need to know why the pt is needing this rx refilled.

## 2013-02-03 NOTE — Telephone Encounter (Signed)
LMOMTCB x 1 

## 2013-02-04 ENCOUNTER — Other Ambulatory Visit (INDEPENDENT_AMBULATORY_CARE_PROVIDER_SITE_OTHER): Payer: Self-pay | Admitting: Pulmonary Disease

## 2013-02-05 NOTE — Telephone Encounter (Signed)
Called, spoke with pt.  Reports she was trying to get the meds refilled that she uses on a regular basis prior to the first of the year bc they were $2.55.  Reports she believes she has received all the meds she needs right now.  Pt reports if anything further is needed she will call back.

## 2013-02-10 ENCOUNTER — Ambulatory Visit (INDEPENDENT_AMBULATORY_CARE_PROVIDER_SITE_OTHER): Payer: Medicare Other | Admitting: Nurse Practitioner

## 2013-02-10 ENCOUNTER — Encounter: Payer: Self-pay | Admitting: Nurse Practitioner

## 2013-02-10 VITALS — BP 160/70 | HR 101 | Ht 60.0 in | Wt 201.4 lb

## 2013-02-10 DIAGNOSIS — I4891 Unspecified atrial fibrillation: Secondary | ICD-10-CM

## 2013-02-10 NOTE — Patient Instructions (Signed)
Make sure you go by this list of medicines - clarify when you get home and let us know if it is not correct  I will see you in about 2 months  Call the [place] office at [phone] if you have any questions, problems or concerns.

## 2013-02-10 NOTE — Progress Notes (Signed)
[name] Date of Birth: [birth year] Medical Record #[id number]  History of Present Illness: [name] is seen back today for a post hospital visit. She is seen for Dr. Rayann Heman. She has COPD, prior lung cancer and has had prior lung surgery x 2, HTN, DM, HLD, PVD, thyroid nodules and GERD.  I saw her in the office last month - horribly short of breath and in atrial fib. She was admitted. Started on IV diltiazem for rate control. Given flecainide but failed to convert. Transitioned to Tikosyn and converted after her 2nd dose but then reverted back to AF - Tikosyn was stopped. Plan was for her to see her pulmonary doctor for discussion about amiodarone. Looks like she has been seen by Dr. Lenna Gilford but not clear to me as to what his plan of care was. His note does mention that she had been on amiodarone in the past and wanted off of it (no real details noted)  Comes back today. Here alone. Says her breathing is better as "long as I do not do anything". Still quite limited by her breathing. Going in and out of AF. Did not discuss possible amiodarone with Dr. Lenna Gilford. Seeing the pulmonologist in [place] later this week. Not real happy with her care overall in regards to her lungs. Not sure about her medicines.   Current Outpatient Prescriptions  Medication Sig Dispense Refill  . albuterol (PROVENTIL HFA;VENTOLIN HFA) 108 (90 BASE) MCG/ACT inhaler Inhale 2 puffs into the lungs every 6 (six) hours as needed for wheezing.      [name] albuterol (PROVENTIL) (2.5 MG/3ML) 0.083% nebulizer solution Take 2.5 mg by nebulization every 6 (six) hours as needed for wheezing or shortness of breath.      . ALPRAZolam (XANAX) 0.25 MG tablet Take 0.25 mg by mouth 3 (three) times daily as needed for anxiety.      [name] BAYER MICROLET LANCETS lancets Use as instructed once daily  100 each  5  . Biotin 5000 MCG TABS Take 1 tablet by mouth daily.      . budesonide (PULMICORT) 0.5 MG/2ML nebulizer solution Take 0.5 mg by nebulization  daily. Takes along with pulmo mist      . clotrimazole (MYCELEX) 10 MG troche DISSOLVE 1 TABLET IN MOUTH 5 TIMES A DAY FOR 7 DAYS.  35 tablet  0  . co-enzyme Q-10 30 MG capsule Take 30 mg by mouth 3 (three) times daily.      . Cyanocobalamin (VITAMIN B 12 PO) Take by mouth. 1000 mcg once a day       . dexlansoprazole (DEXILANT) 60 MG capsule Take 60 mg by mouth daily.      [name] dextromethorphan-guaiFENesin (MUCINEX DM) 30-600 MG per 12 hr tablet Take 1 tablet by mouth every 12 (twelve) hours.      [name] diltiazem (CARDIZEM CD) 240 MG 24 hr capsule Take 1 capsule (240 mg total) by mouth daily.  30 capsule  6  . ELIQUIS 5 MG TABS tablet take 1 tablet by mouth twice a day  60 tablet  6  . famotidine (PEPCID) 20 MG tablet Take 20 mg by mouth 2 (two) times daily.       . Fenofibrate (FENOGLIDE) 120 MG TABS Take 1 tablet by mouth daily.      . furosemide (LASIX) 20 MG tablet Take 2 tablets (40 mg total) by mouth daily.  30 tablet  6  . glucose blood (BAYER CONTOUR TEST) test strip Use as instructed once daily  32 each  6  . irbesartan (AVAPRO) 300 MG tablet Take 1 tablet (300 mg total) by mouth daily.  30 tablet  6  . metFORMIN (GLUCOPHAGE) 500 MG tablet take 1 tablet by mouth two times daily  60 tablet  6  . methylPREDNISolone (MEDROL DOSEPAK) 4 MG tablet Take 4 mg by mouth. follow package directions      . mometasone (NASONEX) 50 MCG/ACT nasal spray Place 2 sprays into the nose daily.      . Multiple Vitamin (MULTIVITAMIN PO) Take by mouth. Once a day       . nystatin (MYCOSTATIN) 100000 UNIT/ML suspension Take 5 mLs (500,000 Units total) by mouth as needed.  60 mL    . Probiotic Product (ALIGN PO) Take 1 tablet by mouth daily.      . promethazine (PHENERGAN) 12.5 MG tablet take 2 tablets by mouth every 6 hours if needed for nausea  30 tablet  0  . traMADol (ULTRAM) 50 MG tablet take 1 tablet by mouth three times a day if needed  90 tablet  5  . Umeclidinium-Vilanterol (ANORO ELLIPTA) 62.5-25 MCG/INH AEPB  Inhale 1 puff into the lungs daily.      [name] UNABLE TO FIND Oxygen 1.5 L nightly      . Vitamin D, Ergocalciferol, (DRISDOL) 50000 UNITS CAPS Take 1 capsule (50,000 Units total) by mouth every 7 (seven) days.  4 capsule  12   No current facility-administered medications for this visit.    Allergies  Allergen Reactions  . Cephalexin     REACTION: nausea and diarrhea  . Codeine     REACTION: nausea  . Epinephrine     REACTION: nervous, convulsions  . Erythromycin     REACTION: all mycins cause yeast infections  . Morphine     REACTION: nausea  . Sulfamethoxazole-Trimethoprim     REACTION: nausea and diarrhea    Past Medical History  Diagnosis Date  . Asthma   . COPD (chronic obstructive pulmonary disease)   . History of lung cancer   . Hypertension   . Paroxysmal atrial fibrillation   . Peripheral vascular disease   . Venous insufficiency   . Hyperlipidemia   . Diabetes mellitus   . Nontoxic multinodular goiter   . Obesity   . GERD (gastroesophageal reflux disease)   . History of nephrolithiasis   . History of bladder cancer   . DJD (degenerative joint disease)   . Osteopenia   . Anxiety   . History of colon polyps 07/233/2009    Tubular Adenomas  . Diverticulosis of sigmoid colon     mild  . Gastritis     moderate  . Hemorrhoids, internal   . Claustrophobia   . Thyroid nodule   . Chronic anticoagulation     Eliquis    Past Surgical History  Procedure Laterality Date  . S/p right rotator cuff repair  1991    Dr. Wonda Olds, right  . S/p turbt  10/01    Dr. Terance Hart  . S/p left tkr  2002    Dr. Gladstone Lighter  . 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 S/p d&c for removal of endometrial polyp (benign)  12/10    GYN  . Colonoscopy    . Esophagogastroduodenoscopy    . Cardioversion N/A 01/17/2013  Procedure: CARDIOVERSION/BEDSIDE (8L38);  Surgeon: Evans Lance, MD;  Location: [facility] OR;  Service: Cardiovascular;  Laterality: N/A;    History  Smoking status  . Former Smoker -- 0.50 packs/day for 39 years  . Types: Cigarettes  . Quit date: 02/04/1994  Smokeless tobacco  . Never Used    History  Alcohol Use  . Yes    Comment: Occasionally    Family History  Problem Relation Age of Onset  . Asthma Mother   . Heart failure Mother   . Heart attack Father   . Ulcers Father   . Colon cancer Neg Hx     Review of Systems: The review of systems is per the HPI.  All other systems were reviewed and are negative.  Physical Exam: BP 160/70  Pulse 101  Ht 5' (1.524 m)  Wt 201 lb 6.4 oz (91.354 kg)  BMI 39.33 kg/m2 Patient is very pleasant and in no acute distress. Skin is warm and dry. Color is normal.  HEENT is unremarkable. Normocephalic/atraumatic. PERRL. Sclera are nonicteric. Neck is supple. No masses. No JVD. Lungs are pretty clear today. Cardiac exam shows a regular rate and rhythm. Abdomen is soft. Extremities are without edema. Gait and ROM are intact. No gross neurologic deficits noted.  Wt Readings from Last 3 Encounters:  02/10/13 201 lb 6.4 oz (91.354 kg)  02/01/13 202 lb (91.627 kg)  01/21/13 202 lb 13.2 oz (92 kg)     LABORATORY DATA: EKG today shows sinus tach - tracing is reviewed with Dr. Rayann Heman.   Lab Results  Component Value Date   WBC 13.2* 01/15/2013   HGB 13.0 01/15/2013   HCT 40.4 01/15/2013   PLT 261 01/15/2013   GLUCOSE 96 01/21/2013   CHOL 196 04/29/2012   TRIG 84.0 04/29/2012   HDL 46.10 04/29/2012   LDLDIRECT 158.9 04/24/2011   LDLCALC 133* 04/29/2012   ALT 32 05/04/2012   AST 25 05/04/2012   NA 141 01/21/2013   K 3.9 01/21/2013   CL 104 01/21/2013   CREATININE 0.76 01/21/2013   BUN 25* 01/21/2013   CO2 29 01/21/2013   TSH 0.277* 01/15/2013   INR 1.36 01/15/2013   HGBA1C 7.4* 09/30/2012   Echo Study Conclusions from August 2014  Left  ventricle: The cavity size was normal. Wall thickness was increased in a pattern of moderate LVH. Systolic function was normal. The estimated ejection fraction was in the range of 55% to 60%. Doppler parameters are consistent with abnormal left ventricular relaxation (grade 1 diastolic dysfunction).    Assessment / Plan: 1. PAF - failed on Flecainide and Tikosyn - considering amiodarone - has known severe airflow obstruction & superimposed restriction from her prev surg + obesity; I have discussed her care with Dr. Rayann Heman in the office this morning - her primary limitation is her lungs and will always be - we will NOT be starting amiodarone but manage her with rate control and continued anticoagulation. Her success rate for an ablation is felt to be only about 50 to 60% with ERAF.   2. HTN - needs to clarify her medicines by the list given today  3. Obesity  4. Chronic dyspnea   I will see her back in 2 months. No change in medicines today. She will clarify her list when she gets home.  Patient is agreeable to this plan and will call if any problems develop in the interim.   Burtis Junes, RN, [place]  [address] [place], [place]  [address] [phone]

## 2013-02-15 ENCOUNTER — Telehealth: Payer: Self-pay | Admitting: Nurse Practitioner

## 2013-02-15 NOTE — Telephone Encounter (Signed)
S/w pt wanted a copy of AVS sent to pt's address in the mail today

## 2013-02-15 NOTE — Telephone Encounter (Signed)
New Problem:  PT is asking if she can get a copy of her medication list from her last visit along with her list of upcoming appts.. Pt states she left those two documents on the counter here and she is hoping the nurse can send them to her.

## 2013-03-04 ENCOUNTER — Telehealth: Payer: Self-pay | Admitting: Internal Medicine

## 2013-03-04 MED ORDER — DILTIAZEM HCL ER COATED BEADS 120 MG PO CP24
120.0000 mg | ORAL_CAPSULE | Freq: Every evening | ORAL | Status: DC
Start: 2013-03-04 — End: 2013-03-12

## 2013-03-04 NOTE — Telephone Encounter (Signed)
Pt states she was seen by her pulmonologist Dr Laurance Flatten in [place]/ per Dr Laurance Flatten, she feels her SOB is due to rate control. Today bp 150/95 hr 150 bpm. Pt's speech is regular/ no sob noted. I asked pt to sit for 5 minutes and get a new bp/p.  Current 128/95 HR149 bpm Spoke with DOD Dr Lovena Le. He advised to increase diltiazem 240 mg am 120 mg pm // f/u with Dr Rayann Heman in 1 week. Pt was told to expect a call from our office/ Melissa to call to give her an app. Pt was told to take it easy and call with any questions or concerns. Pt agreed to plan.

## 2013-03-04 NOTE — Telephone Encounter (Signed)
New problem    Pt's heart is racing and was told by the Doctor she saw today to call as soon as she got home. The Doctor told her they would send a message to this office.   Please give her a call.

## 2013-03-05 ENCOUNTER — Telehealth: Payer: Self-pay | Admitting: Pulmonary Disease

## 2013-03-05 DIAGNOSIS — Z87898 Personal history of other specified conditions: Secondary | ICD-10-CM

## 2013-03-05 NOTE — Telephone Encounter (Signed)
Spoke with the pt  She states that she is due for mammo Had one last Oct and ended up needing bx which was benign  They already set her up for 03/09/13, but need the order Order sent to [facility]

## 2013-03-08 ENCOUNTER — Encounter: Payer: Self-pay | Admitting: Nurse Practitioner

## 2013-03-08 ENCOUNTER — Ambulatory Visit (INDEPENDENT_AMBULATORY_CARE_PROVIDER_SITE_OTHER): Payer: Medicare Other | Admitting: Nurse Practitioner

## 2013-03-08 ENCOUNTER — Encounter (INDEPENDENT_AMBULATORY_CARE_PROVIDER_SITE_OTHER): Payer: Medicare Other

## 2013-03-08 VITALS — BP 110/70 | HR 76 | Ht 60.0 in | Wt 203.1 lb

## 2013-03-08 DIAGNOSIS — R0602 Shortness of breath: Secondary | ICD-10-CM

## 2013-03-08 DIAGNOSIS — I4891 Unspecified atrial fibrillation: Secondary | ICD-10-CM

## 2013-03-08 NOTE — Patient Instructions (Addendum)
Stay on your current medicines for now.  We will place an event monitor today  I will see you in 5 weeks (with Dr. Rayann Heman)  Call the [place] office at [phone] if you have any questions, problems or concerns.

## 2013-03-08 NOTE — Progress Notes (Signed)
Sharyne Peach Date of Birth: April 11, 1934 Medical Record #[id number]  History of Present Illness: Ms. Trusty is seen back today for an early/follow upvisit. She is seen for Dr. Rayann Heman. She has COPD, prior lung cancer and has had prior lung surgery x 2, HTN, DM, HLD, PVD, thyroid nodules and GERD.   I saw her in the office in December - horribly short of breath and in atrial fib. She was admitted. Started on IV diltiazem for rate control. Given flecainide but failed to convert. Transitioned to Tikosyn and converted after her 2nd dose but then reverted back to AF - Tikosyn was stopped. Plan was for her to see her pulmonary doctor for discussion about amiodarone. Looks like she has been seen by Dr. Lenna Gilford but not clear to me as to what his plan of care was. His note does mention that she had been on amiodarone in the past and wanted off of it (no real details noted)   Seen a month ago her breathing was better as "long as I do not do anything". Still quite limited by her breathing. Going in and out of AF. Did not discuss possible amiodarone with Dr. Lenna Gilford. Seeing the pulmonologist in [place] as well and not real happy with her care overall in regards to her lungs. She was not sure about her medicines. Discussed her care with Dr. Rayann Heman and with her known severe airflow obstruction and superimposed restriction from her previous surgeries plus her obesity - it is felt that her primary limitation will always be her lungs and that we were NOT going to start amiodarone but would manage her AF with rate control and continued anticoagulation. Her success rate for an ablation was felt to be only about 50 to 60% with ERAF per Dr. Rayann Heman.   Comes back today. Here by herself today.  This is an early visit. Says she "feels like she is on a merry go round" in light of multiple providers that she has seen. She tells me that Dr. Lenna Gilford has sent her to Dr. Carmelina Peal, who sent her to another lung doctor at [facility] who has sent  her to another physician at [facility] a Dr. Laurance Flatten. She does not who to believe or who to listen to. Apparently she is "too old to have asthma". With her visit with Dr. Laurance Flatten she was apparently in atrial fib with a rate of 160 to 170 - was told to call here - Dr. Laurance Flatten also thought that all of her issues were heart related and not her lungs. . Dr. Lovena Le (here) increased her diltiazem. She says this "was a miracle". Started feeling better. But now awful today. Now believes that the increased dose of her pulmicort is making her feel worse with worsening shortness of breath. Does not sound like she knows when she is out of rhythm. She just does not know what to do about anything. She first tells me how much better she felt (and almost cancelled this visit) but in the next breath tells me she is awful.    Current Outpatient Prescriptions  Medication Sig Dispense Refill  . albuterol (PROVENTIL HFA) 108 (90 BASE) MCG/ACT inhaler Inhale 2 puffs into the lungs every 6 (six) hours as needed for wheezing or shortness of breath.      . ALPRAZolam (XANAX) 0.25 MG tablet Take 0.25 mg by mouth 3 (three) times daily as needed for anxiety.      [name] BAYER MICROLET LANCETS lancets Use as instructed once daily  100 each  5  . Biotin 5000 MCG TABS Take 1 tablet by mouth daily.      [name] co-enzyme Q-10 30 MG capsule Take 30 mg by mouth 3 (three) times daily.      . Cyanocobalamin (VITAMIN B 12 PO) Take by mouth. 1000 mcg once a day       . dexlansoprazole (DEXILANT) 60 MG capsule Take 60 mg by mouth daily.      [name] dextromethorphan-guaiFENesin (MUCINEX DM) 30-600 MG per 12 hr tablet Take 1 tablet by mouth every 12 (twelve) hours.      [name] diltiazem (CARDIZEM CD) 120 MG 24 hr capsule Take 1 capsule (120 mg total) by mouth every evening.  90 capsule  3  . diltiazem (CARDIZEM CD) 240 MG 24 hr capsule Take 1 capsule (240 mg total) by mouth daily.  30 capsule  6  . ELIQUIS 5 MG TABS tablet take 1 tablet by mouth twice a day  60 tablet  6   . famotidine (PEPCID) 20 MG tablet Take 20 mg by mouth 2 (two) times daily.       . Fenofibrate (FENOGLIDE) 120 MG TABS Take 1 tablet by mouth daily.      . furosemide (LASIX) 20 MG tablet Take 20 mg by mouth daily.      [name] glucose blood (BAYER CONTOUR TEST) test strip Use as instructed once daily  32 each  6  . irbesartan (AVAPRO) 300 MG tablet Take 1 tablet (300 mg total) by mouth daily.  30 tablet  6  . metFORMIN (GLUCOPHAGE) 500 MG tablet take 1 tablet by mouth two times daily  60 tablet  6  . methylPREDNISolone (MEDROL) 2 MG tablet Take 2 mg by mouth daily. Take for 30 days after 4 mg's  Runs out      . methylPREDNISolone (MEDROL) 4 MG tablet Take 4 mg by mouth daily. Ends 2/9 when patient runs out      . mometasone (NASONEX) 50 MCG/ACT nasal spray Place 2 sprays into the nose daily.      . Multiple Vitamin (MULTIVITAMIN PO) Take by mouth. Once a day       . Probiotic Product (ALIGN PO) Take 1 tablet by mouth daily.      . promethazine (PHENERGAN) 12.5 MG tablet take 2 tablets by mouth every 6 hours if needed for nausea  30 tablet  0  . traMADol (ULTRAM) 50 MG tablet take 1 tablet by mouth three times a day if needed  90 tablet  5  . Umeclidinium-Vilanterol (ANORO ELLIPTA) 62.5-25 MCG/INH AEPB Inhale 1 puff into the lungs daily.      [name] UNABLE TO FIND Oxygen 1.5 L nightly      . Vitamin D, Ergocalciferol, (DRISDOL) 50000 UNITS CAPS Take 1 capsule (50,000 Units total) by mouth every 7 (seven) days.  4 capsule  12   No current facility-administered medications for this visit.    Allergies  Allergen Reactions  . Cephalexin     REACTION: nausea and diarrhea  . Codeine     REACTION: nausea  . Epinephrine     REACTION: nervous, convulsions  . Erythromycin     REACTION: all mycins cause yeast infections  . Morphine     REACTION: nausea  . Sulfamethoxazole-Trimethoprim     REACTION: nausea and diarrhea    Past Medical History  Diagnosis Date  . Asthma   . COPD (chronic obstructive  pulmonary disease)   . History of lung cancer   .  Hypertension   . Paroxysmal atrial fibrillation   . Peripheral vascular disease   . Venous insufficiency   . Hyperlipidemia   . Diabetes mellitus   . Nontoxic multinodular goiter   . Obesity   . GERD (gastroesophageal reflux disease)   . History of nephrolithiasis   . History of bladder cancer   . DJD (degenerative joint disease)   . Osteopenia   . Anxiety   . History of colon polyps 07/233/2009    Tubular Adenomas  . Diverticulosis of sigmoid colon     mild  . Gastritis     moderate  . Hemorrhoids, internal   . Claustrophobia   . Thyroid nodule   . Chronic anticoagulation     Eliquis    Past Surgical History  Procedure Laterality Date  . S/p right rotator cuff repair  1991    Dr. Wonda Olds, right  . S/p turbt  10/01    Dr. Terance Hart  . S/p left tkr  2002    Dr. Gladstone Lighter  . 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 S/p d&c for removal of endometrial polyp (benign)  12/10    GYN  . Colonoscopy    . Esophagogastroduodenoscopy    . Cardioversion N/A 01/17/2013    Procedure: CARDIOVERSION/BEDSIDE (4E31);  Surgeon: Evans Lance, MD;  Location: [facility] OR;  Service: Cardiovascular;  Laterality: N/A;    History  Smoking status  . Former Smoker -- 0.50 packs/day for 39 years  . Types: Cigarettes  . Quit date: 02/04/1994  Smokeless tobacco  . Never Used    History  Alcohol Use  . Yes    Comment: Occasionally    Family History  Problem Relation Age of Onset  . Asthma Mother   . Heart failure Mother   . Heart attack Father   . Ulcers Father   . Colon cancer Neg Hx     Review of Systems: The review of systems is per the HPI.  All other systems were reviewed and are negative.  Physical Exam: BP 110/70  Pulse 76  Ht 5' (1.524 m)  Wt 203 lb 1.9 oz  (92.135 kg)  BMI 39.67 kg/m2  SpO2 89% Patient is very pleasant and in no acute distress. She is anxious. Short of breath with activity. Skin is warm and dry. Color is normal.  HEENT is unremarkable. Normocephalic/atraumatic. PERRL. Sclera are nonicteric. Neck is supple. No masses. No JVD. Lungs are clear. Cardiac exam shows a fairly regular rate and rhythm today. Abdomen is obese but soft. Extremities are without edema. Gait and ROM are intact. No gross neurologic deficits noted.  Wt Readings from Last 3 Encounters:  03/08/13 203 lb 1.9 oz (92.135 kg)  02/10/13 201 lb 6.4 oz (91.354 kg)  02/01/13 202 lb (91.627 kg)    LABORATORY DATA:  EKG today shows that she is clearly in sinus rhythm today.    Lab Results  Component Value Date   WBC 13.2* 01/15/2013   HGB 13.0 01/15/2013   HCT 40.4 01/15/2013   PLT 261 01/15/2013   GLUCOSE 96 01/21/2013   CHOL 196 04/29/2012   TRIG 84.0 04/29/2012   HDL 46.10 04/29/2012   LDLDIRECT 158.9 04/24/2011   LDLCALC 133* 04/29/2012   ALT 32 05/04/2012   AST 25 05/04/2012   NA 141  01/21/2013   K 3.9 01/21/2013   CL 104 01/21/2013   CREATININE 0.76 01/21/2013   BUN 25* 01/21/2013   CO2 29 01/21/2013   TSH 0.277* 01/15/2013   INR 1.36 01/15/2013   HGBA1C 7.4* 09/30/2012     Echo Study Conclusions from August 2014  Left ventricle: The cavity size was normal. Wall thickness was increased in a pattern of moderate LVH. Systolic function was normal. The estimated ejection fraction was in the range of 55% to 60%. Doppler parameters are consistent with abnormal left ventricular relaxation (grade 1 diastolic dysfunction).  Assessment / Plan:  1. PAF - failed on Flecainide and Tikosyn - considering amiodarone - has known severe airflow obstruction & superimposed restriction from her prev surg + obesity; already discussed her care with Dr. Rayann Heman in the office in January of 2015 - her primary limitation from our standpoint is felt to be her lungs and will  always be - we have already decided to NOT be start amiodarone but continue to manage her with rate control and continued anticoagulation. Her success rate for an ablation is felt to be only about 50 to 60% with ERAF. She is clearly in sinus today and I do not think her "feeling awful today" correlates with her rhythm. Her symptoms are quite difficult to sort out  - I have talked with Dr. Rayann Heman again for his input.  We will place an event monitor to get a better since of her AF burden/rate control, ?consider AV nodal ablation with possible PPM.   2. HTN   3. Obesity   4. Chronic dyspnea - felt to be multifactorial and indefinite.   Patient is agreeable to this plan and will call if any problems develop in the interim.   Burtis Junes, RN, [place]  [address]  [place], [place] [address]  [phone]

## 2013-03-09 ENCOUNTER — Telehealth: Payer: Self-pay | Admitting: Nurse Practitioner

## 2013-03-09 NOTE — Telephone Encounter (Signed)
S/w pt was concerned monitor kept going off stated that was normal if pt was having episodes, pt asked about the avs clarified what was on avs, pt stated couldn't keep appointment going to the beach didn't know when pt was coming back, I changed appointment to the day the monitor comes off the pt is leaving two days later to go to the beach,  will check with Cecille Rubin to make sure appt is ok and Dr. Rayann Heman will be here that day

## 2013-03-09 NOTE — Telephone Encounter (Signed)
New Problem:  Pt states she is returning a call to [place]. Pt would like a call back.

## 2013-03-12 ENCOUNTER — Telehealth: Payer: Self-pay | Admitting: Pulmonary Disease

## 2013-03-12 ENCOUNTER — Telehealth: Payer: Self-pay | Admitting: *Deleted

## 2013-03-12 MED ORDER — DILTIAZEM HCL ER COATED BEADS 240 MG PO CP24: 240.0000 mg | ORAL_CAPSULE | Freq: Two times a day (BID) | ORAL | Status: DC

## 2013-03-12 NOTE — Telephone Encounter (Signed)
Called and spoke with pt and she stated that she was sent to a pulmonary specialist at [facility] and an asthma specialist Dr. Soledad Gerlach.  She stated that these doctors have changed and increased her medications.  Started and doubled her pulmicort but stopped her budesonide.   She stated that she has been in afib and her BP has been running about 160's.  She has been seen by cardiology Thursday and they have increased her heart/bp meds.   She stated that she has been feeling very tired.  Pt c/o of her BS have been elevated to about 180-325----pt stated that she has been on metformin 1 po BID and pt wanted to see if she will need to increase to 3 per day?  SN please advise. Thanks  Allergies  Allergen Reactions  . Cephalexin     REACTION: nausea and diarrhea  . Codeine     REACTION: nausea  . Epinephrine     REACTION: nervous, convulsions  . Erythromycin     REACTION: all mycins cause yeast infections  . Morphine     REACTION: nausea  . Sulfamethoxazole-Trimethoprim     REACTION: nausea and diarrhea    Current Outpatient Prescriptions on File Prior to Visit  Medication Sig Dispense Refill  . albuterol (PROVENTIL HFA) 108 (90 BASE) MCG/ACT inhaler Inhale 2 puffs into the lungs every 6 (six) hours as needed for wheezing or shortness of breath.      . ALPRAZolam (XANAX) 0.25 MG tablet Take 0.25 mg by mouth 3 (three) times daily as needed for anxiety.      [name] BAYER MICROLET LANCETS lancets Use as instructed once daily  100 each  5  . Biotin 5000 MCG TABS Take 1 tablet by mouth daily.      [name] co-enzyme Q-10 30 MG capsule Take 30 mg by mouth 3 (three) times daily.      . Cyanocobalamin (VITAMIN B 12 PO) Take by mouth. 1000 mcg once a day       . dexlansoprazole (DEXILANT) 60 MG capsule Take 60 mg by mouth daily.      [name] dextromethorphan-guaiFENesin (MUCINEX DM) 30-600 MG per 12 hr tablet Take 1 tablet by mouth every 12 (twelve) hours.      [name] diltiazem (CARDIZEM CD) 240 MG 24 hr capsule Take 1  capsule (240 mg total) by mouth 2 (two) times daily.  30 capsule  6  . ELIQUIS 5 MG TABS tablet take 1 tablet by mouth twice a day  60 tablet  6  . famotidine (PEPCID) 20 MG tablet Take 20 mg by mouth 2 (two) times daily.       . Fenofibrate (FENOGLIDE) 120 MG TABS Take 1 tablet by mouth daily.      . furosemide (LASIX) 20 MG tablet Take 20 mg by mouth daily.      [name] glucose blood (BAYER CONTOUR TEST) test strip Use as instructed once daily  32 each  6  . irbesartan (AVAPRO) 300 MG tablet Take 1 tablet (300 mg total) by mouth daily.  30 tablet  6  . metFORMIN (GLUCOPHAGE) 500 MG tablet take 1 tablet by mouth two times daily  60 tablet  6  . methylPREDNISolone (MEDROL) 2 MG tablet Take 2 mg by mouth daily. Take for 30 days after 4 mg's  Runs out      . methylPREDNISolone (MEDROL) 4 MG tablet Take 4 mg by mouth daily. Ends 2/9 when patient runs out      .  mometasone (NASONEX) 50 MCG/ACT nasal spray Place 2 sprays into the nose daily.      . Multiple Vitamin (MULTIVITAMIN PO) Take by mouth. Once a day       . Probiotic Product (ALIGN PO) Take 1 tablet by mouth daily.      . promethazine (PHENERGAN) 12.5 MG tablet take 2 tablets by mouth every 6 hours if needed for nausea  30 tablet  0  . traMADol (ULTRAM) 50 MG tablet take 1 tablet by mouth three times a day if needed  90 tablet  5  . Umeclidinium-Vilanterol (ANORO ELLIPTA) 62.5-25 MCG/INH AEPB Inhale 1 puff into the lungs daily.      [name] UNABLE TO FIND Oxygen 1.5 L nightly      . Vitamin D, Ergocalciferol, (DRISDOL) 50000 UNITS CAPS Take 1 capsule (50,000 Units total) by mouth every 7 (seven) days.  4 capsule  12   No current facility-administered medications on file prior to visit.

## 2013-03-12 NOTE — Telephone Encounter (Signed)
Received fax from [facility] showing rate 170 w/Afib.  Report was reviewed by Kathrene Alu who advises pt increase Cardiazem to 240 mg BID.  Spoke with pt who states a Roetta Sessions called her last PM as well as [facility].  Aaron Edelman had told her to take her dose of 120 mg of Cardiazem last pm.  She states her HR last night was up to 120.  This AM was 118, some SOB.  She will increase her Cardiazem to 240 mg BID.  Will advise her if we receive any other reports from [facility].

## 2013-03-15 ENCOUNTER — Telehealth: Payer: Self-pay | Admitting: *Deleted

## 2013-03-15 NOTE — Telephone Encounter (Signed)
Received recording from [facility], Afib with RVR rate 140-150 from 03/13/13. Reviewed with Tera Helper NP, no changes. Did confirm with patient she was taking her Diltiazem twice a day and Eliquis twice a day. Will give to Arcola Jansky RN tomorrow for Dr Rayann Heman to review

## 2013-03-16 NOTE — Telephone Encounter (Signed)
Per SN---  Rather than increase the metformin, SN would rec adding glimepiride 2 mg 1 every morning.    Give the new regimen a try and some time to kick in and work.   Did Dr. Neldon Mc start xolair?

## 2013-03-16 NOTE — Telephone Encounter (Signed)
I called and spoke with pt. She was very rude since we are just now calling 4 days later from her original message. She reports she takes Glimepiride 2 every morning already.  Also Dr. Neldon Mc has not started [facility] and is not in plan to do so either. She would like to speak with SN personally if possible bc she has a lot things to discuss regarding Dr. Lyndon Code visit. Please advise SN thanks  Allergies  Allergen Reactions  . Cephalexin     REACTION: nausea and diarrhea  . Codeine     REACTION: nausea  . Epinephrine     REACTION: nervous, convulsions  . Erythromycin     REACTION: all mycins cause yeast infections  . Morphine     REACTION: nausea  . Sulfamethoxazole-Trimethoprim     REACTION: nausea and diarrhea

## 2013-03-17 ENCOUNTER — Ambulatory Visit: Payer: Medicare Other | Admitting: Internal Medicine

## 2013-03-17 NOTE — Telephone Encounter (Signed)
SN spoke with pt.  Nothing further is needed.

## 2013-03-18 ENCOUNTER — Telehealth: Payer: Self-pay | Admitting: Pulmonary Disease

## 2013-03-18 NOTE — Telephone Encounter (Signed)
Spoke with pt. Her breathing is very labored and shallow while trying to speak. Advised pt right away that she needed to call 911. She got upset and said that she wasn't going to do that, she wants to see SN. I spoke with Leigh and she advised that the pt needs to call 911. Advised the pt for the second time that she needed to call 911. States that she hasn't had her morning breathing treatment, she will do that and see if that helps. I pleaded with the pt to call 911, she couldn't even form a whole sentence without gasping for air. Again she declined calling 911. States she will call back after taking her breathing treatment if she isn't better.

## 2013-03-22 ENCOUNTER — Other Ambulatory Visit: Payer: Self-pay | Admitting: Pulmonary Disease

## 2013-03-25 ENCOUNTER — Other Ambulatory Visit: Payer: Self-pay | Admitting: Pulmonary Disease

## 2013-03-29 ENCOUNTER — Telehealth: Payer: Self-pay | Admitting: Pulmonary Disease

## 2013-03-29 NOTE — Telephone Encounter (Signed)
I called pt. She wants to speak with personally. Wants to discuss something private with her and about a referral. Would not give me any further details. Please advise TP thanks

## 2013-03-29 NOTE — Telephone Encounter (Signed)
done

## 2013-04-05 ENCOUNTER — Ambulatory Visit: Payer: Medicare Other | Admitting: Nurse Practitioner

## 2013-04-05 ENCOUNTER — Telehealth: Payer: Self-pay | Admitting: Internal Medicine

## 2013-04-05 NOTE — Telephone Encounter (Signed)
New message     Have dental appt on thurs for a cleaning.  She is on eliquis. Can she have cleaning and should she be off medication.  Pt did not take medication for today in case she needs to be off for several days

## 2013-04-05 NOTE — Telephone Encounter (Signed)
Do not have to stop Eliquis for cleaning of her teeth.  Patient aware

## 2013-04-06 ENCOUNTER — Other Ambulatory Visit: Payer: Self-pay | Admitting: Pulmonary Disease

## 2013-04-07 ENCOUNTER — Ambulatory Visit (INDEPENDENT_AMBULATORY_CARE_PROVIDER_SITE_OTHER): Payer: Medicare Other | Admitting: Nurse Practitioner

## 2013-04-07 ENCOUNTER — Encounter: Payer: Self-pay | Admitting: Nurse Practitioner

## 2013-04-07 VITALS — BP 140/62 | HR 135 | Ht 60.0 in | Wt 209.4 lb

## 2013-04-07 DIAGNOSIS — I4891 Unspecified atrial fibrillation: Secondary | ICD-10-CM

## 2013-04-07 NOTE — Patient Instructions (Addendum)
We will refer you to EP at [facility] - with DR. MOUNCEY  We need you to sign a release for records to be sent to [facility]  See Dr. Rayann Heman back in 3 months  Stay on your current medicines - make sure this list you are given today is accurate  Call the [place] office at [phone] if you have any questions, problems or concerns.

## 2013-04-07 NOTE — Progress Notes (Addendum)
[name] Date of Birth: 1934/09/10 Medical Record #[id number]  History of Present Illness: [name] is seen back today for a follow upvisit. She is seen for [name]. She has COPD, prior lung cancer and has had prior lung surgery x 2, HTN, DM, HLD, PVD, thyroid nodules and GERD.   I saw her in the office in December - horribly short of breath and in atrial fib. She was admitted. Started on IV diltiazem for rate control. Given flecainide but failed to convert. Transitioned to Tikosyn and converted after her 2nd dose but then reverted back to AF - Tikosyn was stopped. Plan was for her to see her pulmonary doctor for discussion about amiodarone. Looks like she has been seen by [name] but not clear to me as to what his plan of care was. His note does mention that she had been on amiodarone in the past and wanted off of it (no real details noted)   Seen several times since that admission - remained quite limited by her breathing. Going in and out of AF. Did not discuss possible amiodarone with [name]. Seeing the pulmonologist in [place] as well and not real happy with her care overall in regards to her lungs. She remains unsure about her medicines. Discussed her care with [name] and with her known severe airflow obstruction and superimposed restriction from her previous surgeries plus her obesity - it is felt that her primary limitation will always be her lungs and that we were NOT going to start amiodarone but would manage her AF with rate control and continued anticoagulation. Her success rate for an ablation was felt to be only about 50 to 60% with ERAF per [name].   Seen last month - she felt "like she was on a merry go round" in light of multiple providers that she has seen. She is seeing multiple physicians. Multiple medicine changes. We placed an event monitor - multiple bouts of AF with RVR noted.   Comes back today. Here alone. Seen with [name]. Heart is racing today.  Feels terrible still. Still seeing lots of doctors. Medicines changed again. Keeps being told by everyone else that her heart is the big issue. When she was here last month she felt bad but was clearly in sinus rhythm. Remains short of breath. She has stopped seeing the pulmonologist at [place] with [name].   Current Outpatient Prescriptions  Medication Sig Dispense Refill  . ALPRAZolam (XANAX) 0.25 MG tablet take 1/2 to 1 tablet by mouth three times a day  90 tablet  1  . BAYER MICROLET LANCETS lancets Use as instructed once daily  100 each  5  . Biotin 5000 MCG TABS Take 1 tablet by mouth daily.      [name] co-enzyme Q-10 30 MG capsule Take 30 mg by mouth 3 (three) times daily.      . Cyanocobalamin (VITAMIN B 12 PO) Take by mouth. 1000 mcg once a day       . dexlansoprazole (DEXILANT) 60 MG capsule Take 60 mg by mouth daily.      [name] dextromethorphan-guaiFENesin (MUCINEX DM) 30-600 MG per 12 hr tablet Take 1 tablet by mouth every 12 (twelve) hours.      [name] diltiazem (CARDIZEM CD) 240 MG 24 hr capsule Take 1 capsule (240 mg total) by mouth 2 (two) times daily.  30 capsule  6  . ELIQUIS 5 MG TABS tablet take 1 tablet by mouth twice a  day  60 tablet  6  . famotidine (PEPCID) 20 MG tablet Take 20 mg by mouth 2 (two) times daily.       . Fenofibrate (FENOGLIDE) 120 MG TABS Take 1 tablet by mouth daily.      . furosemide (LASIX) 20 MG tablet Take 20 mg by mouth daily.      [name] glucose blood (BAYER CONTOUR TEST) test strip Use as instructed once daily  32 each  6  . irbesartan (AVAPRO) 300 MG tablet Take 1 tablet (300 mg total) by mouth daily.  30 tablet  6  . metFORMIN (GLUCOPHAGE) 500 MG tablet take 1 tablet by mouth two times daily  60 tablet  6  . methylPREDNISolone (MEDROL) 2 MG tablet Take 2 mg by mouth daily. Take for 30 days after 4 mg's  Runs out      . methylPREDNISolone (MEDROL) 4 MG tablet Take 4 mg by mouth daily. Ends 2/9 when patient runs out      . mometasone (NASONEX) 50 MCG/ACT  nasal spray Place 2 sprays into the nose daily.      . Multiple Vitamin (MULTIVITAMIN PO) Take by mouth. Once a day       . PROAIR HFA 108 (90 BASE) MCG/ACT inhaler inhale 1 to 2 puffs every 4 hours if needed for wheezing  8.5 g  11  . PROAIR HFA 108 (90 BASE) MCG/ACT inhaler inhale 1 to 2 puffs every 4 hours if needed for wheezing  8.5 g  11  . Probiotic Product (ALIGN PO) Take 1 tablet by mouth daily.      . promethazine (PHENERGAN) 12.5 MG tablet take 2 tablets by mouth every 6 hours if needed for nausea  30 tablet  0  . traMADol (ULTRAM) 50 MG tablet take 1 tablet by mouth three times a day if needed  90 tablet  5  . Umeclidinium-Vilanterol (ANORO ELLIPTA) 62.5-25 MCG/INH AEPB Inhale 1 puff into the lungs daily.      [name] UNABLE TO FIND Oxygen 1.5 L nightly      . Vitamin D, Ergocalciferol, (DRISDOL) 50000 UNITS CAPS Take 1 capsule (50,000 Units total) by mouth every 7 (seven) days.  4 capsule  12   No current facility-administered medications for this visit.    Allergies  Allergen Reactions  . Cephalexin     REACTION: nausea and diarrhea  . Codeine     REACTION: nausea  . Epinephrine     REACTION: nervous, convulsions  . Erythromycin     REACTION: all mycins cause yeast infections  . Morphine     REACTION: nausea  . Sulfamethoxazole-Trimethoprim     REACTION: nausea and diarrhea    Past Medical History  Diagnosis Date  . Asthma   . COPD (chronic obstructive pulmonary disease)   . History of lung cancer   . Hypertension   . Paroxysmal atrial fibrillation   . Peripheral vascular disease   . Venous insufficiency   . Hyperlipidemia   . Diabetes mellitus   . Nontoxic multinodular goiter   . Obesity   . GERD (gastroesophageal reflux disease)   . History of nephrolithiasis   . History of bladder cancer   . DJD (degenerative joint disease)   . Osteopenia   . Anxiety   . History of colon polyps 07/233/2009    Tubular Adenomas  . Diverticulosis of sigmoid colon     mild    . Gastritis     moderate  . Hemorrhoids, internal   .  Claustrophobia   . Thyroid nodule   . Chronic anticoagulation     Eliquis    Past Surgical History  Procedure Laterality Date  . S/p right rotator cuff repair  1991    Dr. Wonda Olds, right  . S/p turbt  10/01    Dr. Terance Hart  . S/p left tkr  2002    Dr. Gladstone Lighter  . 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 S/p d&c for removal of endometrial polyp (benign)  12/10    GYN  . Colonoscopy    . Esophagogastroduodenoscopy    . Cardioversion N/A 01/17/2013    Procedure: CARDIOVERSION/BEDSIDE (0Z60);  Surgeon: Evans Lance, MD;  Location: [facility] OR;  Service: Cardiovascular;  Laterality: N/A;    History  Smoking status  . Former Smoker -- 0.50 packs/day for 39 years  . Types: Cigarettes  . Quit date: 02/04/1994  Smokeless tobacco  . Never Used    History  Alcohol Use  . Yes    Comment: Occasionally    Family History  Problem Relation Age of Onset  . Asthma Mother   . Heart failure Mother   . Heart attack Father   . Ulcers Father   . Colon cancer Neg Hx     Review of Systems: The review of systems is per the HPI.  All other systems were reviewed and are negative.  Physical Exam: BP 140/62  Pulse 135  Ht 5' (1.524 m)  Wt 209 lb 6.4 oz (94.983 kg)  BMI 40.90 kg/m2  SpO2 95% Patient is very pleasant and in no acute distress. She remains short of breath. She is obese. R is back up today. Skin is warm and dry. Color is normal.  HEENT is unremarkable. Normocephalic/atraumatic. PERRL. Sclera are nonicteric. Neck is supple. No masses. No JVD. Lungs are surprisingly clear. Cardiac exam shows an irregular rate and rhythm. Rate is fast.  Abdomen is soft. Extremities are withedema. Gait not tested. No gross neurologic deficits noted.  LABORATORY DATA:  Lab  Results  Component Value Date   WBC 13.2* 01/15/2013   HGB 13.0 01/15/2013   HCT 40.4 01/15/2013   PLT 261 01/15/2013   GLUCOSE 96 01/21/2013   CHOL 196 04/29/2012   TRIG 84.0 04/29/2012   HDL 46.10 04/29/2012   LDLDIRECT 158.9 04/24/2011   LDLCALC 133* 04/29/2012   ALT 32 05/04/2012   AST 25 05/04/2012   NA 141 01/21/2013   K 3.9 01/21/2013   CL 104 01/21/2013   CREATININE 0.76 01/21/2013   BUN 25* 01/21/2013   CO2 29 01/21/2013   TSH 0.277* 01/15/2013   INR 1.36 01/15/2013   HGBA1C 7.4* 09/30/2012   Echo Study Conclusions  Left ventricle: The cavity size was normal. Wall thickness was increased in a pattern of moderate LVH. Systolic function was normal. The estimated ejection fraction was in the range of 55% to 60%. Doppler parameters are consistent with abnormal left ventricular relaxation (grade 1 diastolic dysfunction).   Assessment / Plan: 1. PAF - failed on Flecainide and Tikosyn - not planning to start amiodarone - has known severe airflow obstruction & superimposed restriction from her prev surg + obesity; already discussed her care with [name] in the office in January of 2015 - her primary limitation from our standpoint is felt to be her  lungs and will always be - we have already decided to NOT  start amiodarone but continue to manage her with rate control and continued anticoagulation. Her success rate for an ablation was felt to be only about 50 to 60% with ERAF. Her symptoms are quite difficult to sort out - she was here at her last visit and felt bad but was clearly in sinus. She is seen with [name] today - he feels that we can refer for 2nd opinion at [facility] or proceed with AVN ablation with PPM implant. We have opted to send to [facility] - will need to sign release for records to be sent.  2. HTN   3. Obesity   4. Chronic dyspnea - felt to be multifactorial and will be indefinite. She asks repeatedly what she should be doing to get her lungs better,  what she should expect long term from her breathing issues, prognosis, and who she should be seeing, etc - I have repeatedly asked her to discuss this with [name].   Patient is agreeable to this plan and will call if any problems develop in the interim.   Burtis Junes, RN, [place]  [address] [place]  [place], [place] [address]  [phone]

## 2013-04-12 ENCOUNTER — Ambulatory Visit: Payer: Medicare Other | Admitting: Nurse Practitioner

## 2013-04-14 ENCOUNTER — Telehealth: Payer: Self-pay | Admitting: Pulmonary Disease

## 2013-04-14 NOTE — Telephone Encounter (Signed)
Don't see anything in epic. [name] has been calling some of SN's pt.s please advise leigh thanks

## 2013-04-15 NOTE — Telephone Encounter (Signed)
Called and spoke with pt and she will keep her appt with SN on 4-6 for her follow up of COPD and she will discuss with SN about a new primary care doctor at [place].

## 2013-04-28 ENCOUNTER — Encounter (HOSPITAL_COMMUNITY): Payer: Self-pay | Admitting: Emergency Medicine

## 2013-04-28 ENCOUNTER — Inpatient Hospital Stay (HOSPITAL_COMMUNITY)
Admission: EM | Admit: 2013-04-28 | Discharge: 2013-05-12 | DRG: 193 | Disposition: A | Discharge status: Skilled Nursing Facility | Payer: Medicare Other | Attending: Internal Medicine | Admitting: Internal Medicine

## 2013-04-28 ENCOUNTER — Emergency Department (HOSPITAL_COMMUNITY): Payer: Medicare Other

## 2013-04-28 DIAGNOSIS — J4489 Other specified chronic obstructive pulmonary disease: Secondary | ICD-10-CM | POA: Diagnosis present

## 2013-04-28 DIAGNOSIS — Z8249 Family history of ischemic heart disease and other diseases of the circulatory system: Secondary | ICD-10-CM

## 2013-04-28 DIAGNOSIS — I872 Venous insufficiency (chronic) (peripheral): Secondary | ICD-10-CM | POA: Diagnosis present

## 2013-04-28 DIAGNOSIS — D649 Anemia, unspecified: Secondary | ICD-10-CM | POA: Diagnosis present

## 2013-04-28 DIAGNOSIS — J9601 Acute respiratory failure with hypoxia: Secondary | ICD-10-CM | POA: Diagnosis present

## 2013-04-28 DIAGNOSIS — I739 Peripheral vascular disease, unspecified: Secondary | ICD-10-CM | POA: Diagnosis present

## 2013-04-28 DIAGNOSIS — E872 Acidosis, unspecified: Secondary | ICD-10-CM | POA: Diagnosis present

## 2013-04-28 DIAGNOSIS — M1990 Unspecified osteoarthritis, unspecified site: Secondary | ICD-10-CM

## 2013-04-28 DIAGNOSIS — I5033 Acute on chronic diastolic (congestive) heart failure: Secondary | ICD-10-CM | POA: Diagnosis present

## 2013-04-28 DIAGNOSIS — Z794 Long term (current) use of insulin: Secondary | ICD-10-CM

## 2013-04-28 DIAGNOSIS — M545 Low back pain, unspecified: Secondary | ICD-10-CM

## 2013-04-28 DIAGNOSIS — E875 Hyperkalemia: Secondary | ICD-10-CM | POA: Diagnosis present

## 2013-04-28 DIAGNOSIS — B009 Herpesviral infection, unspecified: Secondary | ICD-10-CM | POA: Diagnosis present

## 2013-04-28 DIAGNOSIS — N179 Acute kidney failure, unspecified: Secondary | ICD-10-CM | POA: Diagnosis present

## 2013-04-28 DIAGNOSIS — Z885 Allergy status to narcotic agent status: Secondary | ICD-10-CM

## 2013-04-28 DIAGNOSIS — J441 Chronic obstructive pulmonary disease with (acute) exacerbation: Secondary | ICD-10-CM | POA: Diagnosis present

## 2013-04-28 DIAGNOSIS — E785 Hyperlipidemia, unspecified: Secondary | ICD-10-CM | POA: Diagnosis present

## 2013-04-28 DIAGNOSIS — K317 Polyp of stomach and duodenum: Secondary | ICD-10-CM

## 2013-04-28 DIAGNOSIS — E669 Obesity, unspecified: Secondary | ICD-10-CM

## 2013-04-28 DIAGNOSIS — F40298 Other specified phobia: Secondary | ICD-10-CM | POA: Diagnosis present

## 2013-04-28 DIAGNOSIS — J449 Chronic obstructive pulmonary disease, unspecified: Secondary | ICD-10-CM | POA: Diagnosis present

## 2013-04-28 DIAGNOSIS — E042 Nontoxic multinodular goiter: Secondary | ICD-10-CM | POA: Diagnosis present

## 2013-04-28 DIAGNOSIS — C679 Malignant neoplasm of bladder, unspecified: Secondary | ICD-10-CM

## 2013-04-28 DIAGNOSIS — M949 Disorder of cartilage, unspecified: Secondary | ICD-10-CM

## 2013-04-28 DIAGNOSIS — E1149 Type 2 diabetes mellitus with other diabetic neurological complication: Secondary | ICD-10-CM | POA: Diagnosis present

## 2013-04-28 DIAGNOSIS — J45909 Unspecified asthma, uncomplicated: Secondary | ICD-10-CM

## 2013-04-28 DIAGNOSIS — K219 Gastro-esophageal reflux disease without esophagitis: Secondary | ICD-10-CM | POA: Diagnosis present

## 2013-04-28 DIAGNOSIS — I10 Essential (primary) hypertension: Secondary | ICD-10-CM | POA: Diagnosis present

## 2013-04-28 DIAGNOSIS — Z9981 Dependence on supplemental oxygen: Secondary | ICD-10-CM

## 2013-04-28 DIAGNOSIS — C3490 Malignant neoplasm of unspecified part of unspecified bronchus or lung: Secondary | ICD-10-CM | POA: Diagnosis present

## 2013-04-28 DIAGNOSIS — K5730 Diverticulosis of large intestine without perforation or abscess without bleeding: Secondary | ICD-10-CM | POA: Diagnosis present

## 2013-04-28 DIAGNOSIS — Z8551 Personal history of malignant neoplasm of bladder: Secondary | ICD-10-CM

## 2013-04-28 DIAGNOSIS — J9600 Acute respiratory failure, unspecified whether with hypoxia or hypercapnia: Secondary | ICD-10-CM

## 2013-04-28 DIAGNOSIS — Z87891 Personal history of nicotine dependence: Secondary | ICD-10-CM

## 2013-04-28 DIAGNOSIS — Z860100 Personal history of colon polyps, unspecified: Secondary | ICD-10-CM

## 2013-04-28 DIAGNOSIS — Z85118 Personal history of other malignant neoplasm of bronchus and lung: Secondary | ICD-10-CM

## 2013-04-28 DIAGNOSIS — Z79899 Other long term (current) drug therapy: Secondary | ICD-10-CM

## 2013-04-28 DIAGNOSIS — M899 Disorder of bone, unspecified: Secondary | ICD-10-CM

## 2013-04-28 DIAGNOSIS — Z6841 Body Mass Index (BMI) 40.0 and over, adult: Secondary | ICD-10-CM

## 2013-04-28 DIAGNOSIS — K5520 Angiodysplasia of colon without hemorrhage: Secondary | ICD-10-CM

## 2013-04-28 DIAGNOSIS — I959 Hypotension, unspecified: Secondary | ICD-10-CM | POA: Diagnosis present

## 2013-04-28 DIAGNOSIS — Z825 Family history of asthma and other chronic lower respiratory diseases: Secondary | ICD-10-CM

## 2013-04-28 DIAGNOSIS — J45901 Unspecified asthma with (acute) exacerbation: Secondary | ICD-10-CM

## 2013-04-28 DIAGNOSIS — Z888 Allergy status to other drugs, medicaments and biological substances status: Secondary | ICD-10-CM

## 2013-04-28 DIAGNOSIS — J9620 Acute and chronic respiratory failure, unspecified whether with hypoxia or hypercapnia: Secondary | ICD-10-CM | POA: Diagnosis present

## 2013-04-28 DIAGNOSIS — Z7901 Long term (current) use of anticoagulants: Secondary | ICD-10-CM

## 2013-04-28 DIAGNOSIS — E119 Type 2 diabetes mellitus without complications: Secondary | ICD-10-CM

## 2013-04-28 DIAGNOSIS — J189 Pneumonia, unspecified organism: Principal | ICD-10-CM | POA: Diagnosis present

## 2013-04-28 DIAGNOSIS — I4891 Unspecified atrial fibrillation: Secondary | ICD-10-CM | POA: Diagnosis present

## 2013-04-28 DIAGNOSIS — I509 Heart failure, unspecified: Secondary | ICD-10-CM | POA: Diagnosis present

## 2013-04-28 DIAGNOSIS — F411 Generalized anxiety disorder: Secondary | ICD-10-CM | POA: Diagnosis present

## 2013-04-28 DIAGNOSIS — E1142 Type 2 diabetes mellitus with diabetic polyneuropathy: Secondary | ICD-10-CM | POA: Diagnosis present

## 2013-04-28 DIAGNOSIS — Z87442 Personal history of urinary calculi: Secondary | ICD-10-CM

## 2013-04-28 HISTORY — DX: Permanent atrial fibrillation: I48.21

## 2013-04-28 LAB — COMPREHENSIVE METABOLIC PANEL
ALBUMIN: 3.2 g/dL — AB (ref 3.5–5.2)
ALT: 31 U/L (ref 0–35)
AST: 22 U/L (ref 0–37)
Alkaline Phosphatase: 28 U/L — ABNORMAL LOW (ref 39–117)
BUN: 38 mg/dL — ABNORMAL HIGH (ref 6–23)
CALCIUM: 9.6 mg/dL (ref 8.4–10.5)
CO2: 21 mEq/L (ref 19–32)
CREATININE: 1.32 mg/dL — AB (ref 0.50–1.10)
Chloride: 96 mEq/L (ref 96–112)
GFR calc Af Amer: 44 mL/min — ABNORMAL LOW (ref >90)
GFR, EST NON AFRICAN AMERICAN: 38 mL/min — AB (ref >90)
Glucose, Bld: 191 mg/dL — ABNORMAL HIGH (ref 70–99)
Potassium: 4.5 mEq/L (ref 3.7–5.3)
SODIUM: 138 meq/L (ref 137–147)
TOTAL PROTEIN: 6.1 g/dL (ref 6.0–8.3)
Total Bilirubin: 0.7 mg/dL (ref 0.3–1.2)

## 2013-04-28 LAB — CBC WITH DIFFERENTIAL/PLATELET
BASOS PCT: 0 % (ref 0–1)
Basophils Absolute: 0 10*3/uL (ref 0.0–0.1)
EOS ABS: 0 10*3/uL (ref 0.0–0.7)
EOS PCT: 0 % (ref 0–5)
HEMATOCRIT: 42.7 % (ref 36.0–46.0)
Hemoglobin: 14.3 g/dL (ref 12.0–15.0)
Lymphocytes Relative: 13 % (ref 12–46)
Lymphs Abs: 1 10*3/uL (ref 0.7–4.0)
MCH: 32.8 pg (ref 26.0–34.0)
MCHC: 33.5 g/dL (ref 30.0–36.0)
MCV: 97.9 fL (ref 78.0–100.0)
MONO ABS: 0.4 10*3/uL (ref 0.1–1.0)
Monocytes Relative: 5 % (ref 3–12)
Neutro Abs: 6.6 10*3/uL (ref 1.7–7.7)
Neutrophils Relative %: 82 % — ABNORMAL HIGH (ref 43–77)
Platelets: 240 10*3/uL (ref 150–400)
RBC: 4.36 MIL/uL (ref 3.87–5.11)
RDW: 14.9 % (ref 11.5–15.5)
WBC: 8.1 10*3/uL (ref 4.0–10.5)

## 2013-04-28 LAB — PRO B NATRIURETIC PEPTIDE: PRO B NATRI PEPTIDE: 951.9 pg/mL — AB (ref 0–450)

## 2013-04-28 LAB — GLUCOSE, CAPILLARY: GLUCOSE-CAPILLARY: 266 mg/dL — AB (ref 70–99)

## 2013-04-28 LAB — TROPONIN I: Troponin I: <0.3 ng/mL (ref <0.30)

## 2013-04-28 MED ORDER — SODIUM CHLORIDE 0.9 % IV BOLUS (SEPSIS): 1000.0000 mL | INTRAVENOUS | Status: DC

## 2013-04-28 MED ORDER — DM-GUAIFENESIN ER 30-600 MG PO TB12
1.0000 | ORAL_TABLET | Freq: Two times a day (BID) | ORAL | Status: DC
  Administered 2013-04-29 – 2013-05-12 (×28): 1 via ORAL
  Filled 2013-04-28 (×30): qty 1

## 2013-04-28 MED ORDER — RISAQUAD PO CAPS
1.0000 | ORAL_CAPSULE | Freq: Every day | ORAL | Status: DC
  Administered 2013-04-29 – 2013-05-12 (×14): 1 via ORAL
  Filled 2013-04-28 (×15): qty 1

## 2013-04-28 MED ORDER — INSULIN ASPART 100 UNIT/ML ~~LOC~~ SOLN
0.0000 [IU] | SUBCUTANEOUS | Status: DC
  Administered 2013-04-29: 3 [IU] via SUBCUTANEOUS
  Administered 2013-04-29: 11 [IU] via SUBCUTANEOUS
  Administered 2013-04-29 (×2): 4 [IU] via SUBCUTANEOUS
  Administered 2013-04-29: 15 [IU] via SUBCUTANEOUS
  Administered 2013-04-29: 4 [IU] via SUBCUTANEOUS
  Administered 2013-04-30: 3 [IU] via SUBCUTANEOUS
  Administered 2013-04-30 (×4): 4 [IU] via SUBCUTANEOUS
  Administered 2013-04-30 – 2013-05-01 (×3): 7 [IU] via SUBCUTANEOUS
  Administered 2013-05-01: 4 [IU] via SUBCUTANEOUS
  Administered 2013-05-01: 7 [IU] via SUBCUTANEOUS
  Administered 2013-05-01 – 2013-05-02 (×4): 4 [IU] via SUBCUTANEOUS
  Administered 2013-05-02 (×2): 7 [IU] via SUBCUTANEOUS
  Administered 2013-05-02: 11 [IU] via SUBCUTANEOUS
  Administered 2013-05-03 (×2): 4 [IU] via SUBCUTANEOUS
  Administered 2013-05-03 (×4): 7 [IU] via SUBCUTANEOUS
  Administered 2013-05-04 (×2): 4 [IU] via SUBCUTANEOUS
  Administered 2013-05-04: 7 [IU] via SUBCUTANEOUS

## 2013-04-28 MED ORDER — FLUTICASONE PROPIONATE 50 MCG/ACT NA SUSP
1.0000 | Freq: Every day | NASAL | Status: DC
  Administered 2013-04-29 – 2013-05-12 (×14): 1 via NASAL
  Filled 2013-04-28: qty 16

## 2013-04-28 MED ORDER — DILTIAZEM LOAD VIA INFUSION
20.0000 mg | Freq: Once | INTRAVENOUS | Status: AC
  Administered 2013-04-28: 20 mg via INTRAVENOUS
  Filled 2013-04-28: qty 20

## 2013-04-28 MED ORDER — ALPRAZOLAM 0.25 MG PO TABS
0.2500 mg | ORAL_TABLET | Freq: Three times a day (TID) | ORAL | Status: DC | PRN
  Administered 2013-04-29 – 2013-05-12 (×13): 0.25 mg via ORAL
  Filled 2013-04-28 (×13): qty 1

## 2013-04-28 MED ORDER — BUDESONIDE 0.5 MG/2ML IN SUSP
0.5000 mg | Freq: Two times a day (BID) | RESPIRATORY_TRACT | Status: DC
Start: 2013-04-28 — End: 2013-05-12
  Administered 2013-04-29 – 2013-05-12 (×24): 0.5 mg via RESPIRATORY_TRACT
  Filled 2013-04-28 (×30): qty 2

## 2013-04-28 MED ORDER — LEVALBUTEROL HCL 0.63 MG/3ML IN NEBU
0.6300 mg | INHALATION_SOLUTION | RESPIRATORY_TRACT | Status: DC | PRN
  Administered 2013-05-02 – 2013-05-11 (×5): 0.63 mg via RESPIRATORY_TRACT
  Filled 2013-04-28 (×3): qty 3

## 2013-04-28 MED ORDER — DEXTROSE 5 % IV SOLN
500.0000 mg | INTRAVENOUS | Status: DC
  Administered 2013-04-29 – 2013-04-30 (×2): 500 mg via INTRAVENOUS
  Filled 2013-04-28 (×3): qty 500

## 2013-04-28 MED ORDER — DEXTROSE 5 % IV SOLN
500.0000 mg | Freq: Once | INTRAVENOUS | Status: AC
  Administered 2013-04-28: 500 mg via INTRAVENOUS

## 2013-04-28 MED ORDER — ALBUTEROL SULFATE (2.5 MG/3ML) 0.083% IN NEBU
5.0000 mg | INHALATION_SOLUTION | Freq: Once | RESPIRATORY_TRACT | Status: AC
  Administered 2013-04-28: 5 mg via RESPIRATORY_TRACT
  Filled 2013-04-28: qty 6

## 2013-04-28 MED ORDER — ONDANSETRON HCL 4 MG/2ML IJ SOLN
4.0000 mg | Freq: Once | INTRAMUSCULAR | Status: AC
  Administered 2013-04-28: 4 mg via INTRAVENOUS
  Filled 2013-04-28: qty 2

## 2013-04-28 MED ORDER — PANTOPRAZOLE SODIUM 40 MG PO TBEC
40.0000 mg | DELAYED_RELEASE_TABLET | Freq: Every day | ORAL | Status: DC
  Administered 2013-04-29 – 2013-05-05 (×8): 40 mg via ORAL
  Filled 2013-04-28 (×8): qty 1

## 2013-04-28 MED ORDER — ASPIRIN 81 MG PO CHEW
324.0000 mg | CHEWABLE_TABLET | ORAL | Status: AC
  Administered 2013-04-29: 324 mg via ORAL
  Filled 2013-04-28: qty 4

## 2013-04-28 MED ORDER — IPRATROPIUM BROMIDE 0.02 % IN SOLN
0.5000 mg | Freq: Four times a day (QID) | RESPIRATORY_TRACT | Status: DC
  Administered 2013-04-28 – 2013-05-05 (×27): 0.5 mg via RESPIRATORY_TRACT
  Filled 2013-04-28 (×27): qty 2.5

## 2013-04-28 MED ORDER — SODIUM CHLORIDE 0.9 % IV BOLUS (SEPSIS)
500.0000 mL | INTRAVENOUS | Status: AC
  Administered 2013-04-28: 500 mL via INTRAVENOUS

## 2013-04-28 MED ORDER — HEPARIN SODIUM (PORCINE) 5000 UNIT/ML IJ SOLN: 5000.0000 [IU] | Freq: Three times a day (TID) | INTRAMUSCULAR | Status: DC

## 2013-04-28 MED ORDER — APIXABAN 5 MG PO TABS
5.0000 mg | ORAL_TABLET | Freq: Two times a day (BID) | ORAL | Status: DC
  Administered 2013-04-29 – 2013-05-08 (×21): 5 mg via ORAL
  Filled 2013-04-28 (×23): qty 1

## 2013-04-28 MED ORDER — ASPIRIN 300 MG RE SUPP
300.0000 mg | RECTAL | Status: AC
Start: 2013-04-28 — End: 2013-04-29

## 2013-04-28 MED ORDER — DEXTROSE 5 % IV SOLN
1.0000 g | Freq: Once | INTRAVENOUS | Status: AC
  Administered 2013-04-28: 1 g via INTRAVENOUS
  Filled 2013-04-28: qty 10

## 2013-04-28 MED ORDER — DILTIAZEM HCL 100 MG IV SOLR
5.0000 mg/h | INTRAVENOUS | Status: DC
  Administered 2013-04-28 – 2013-04-29 (×3): 10 mg/h via INTRAVENOUS
  Administered 2013-04-29: 7.5 mg/h via INTRAVENOUS
  Administered 2013-04-30: 15 mg/h via INTRAVENOUS
  Administered 2013-04-30: 20 mg/h via INTRAVENOUS
  Administered 2013-04-30 – 2013-05-01 (×4): 15 mg/h via INTRAVENOUS
  Filled 2013-04-28 (×3): qty 100

## 2013-04-28 MED ORDER — FAMOTIDINE 20 MG PO TABS
20.0000 mg | ORAL_TABLET | Freq: Two times a day (BID) | ORAL | Status: DC
  Administered 2013-04-29 – 2013-05-12 (×26): 20 mg via ORAL
  Filled 2013-04-28 (×31): qty 1

## 2013-04-28 MED ORDER — ALBUTEROL SULFATE (2.5 MG/3ML) 0.083% IN NEBU: 5.0000 mg | INHALATION_SOLUTION | Freq: Once | RESPIRATORY_TRACT | Status: DC

## 2013-04-28 MED ORDER — DEXTROSE 5 % IV SOLN
1.0000 g | INTRAVENOUS | Status: AC
  Administered 2013-04-29 – 2013-05-04 (×6): 1 g via INTRAVENOUS
  Filled 2013-04-28 (×6): qty 10

## 2013-04-28 MED ORDER — SODIUM CHLORIDE 0.9 % IV SOLN: 250.0000 mL | INTRAVENOUS | Status: DC | PRN

## 2013-04-28 MED ORDER — ALIGN PO CAPS: 1.0000 | ORAL_CAPSULE | Freq: Every day | ORAL | Status: DC

## 2013-04-28 MED ORDER — SODIUM CHLORIDE 0.9 % IV SOLN: INTRAVENOUS | Status: DC

## 2013-04-28 MED ORDER — SODIUM CHLORIDE 0.9 % IV BOLUS (SEPSIS): 500.0000 mL | INTRAVENOUS | Status: DC

## 2013-04-28 MED ORDER — ALBUTEROL (5 MG/ML) CONTINUOUS INHALATION SOLN
10.0000 mg/h | INHALATION_SOLUTION | Freq: Once | RESPIRATORY_TRACT | Status: AC
  Administered 2013-04-28: 10 mg/h via RESPIRATORY_TRACT
  Filled 2013-04-28: qty 20

## 2013-04-28 MED ORDER — LEVALBUTEROL HCL 0.63 MG/3ML IN NEBU
0.6300 mg | INHALATION_SOLUTION | Freq: Four times a day (QID) | RESPIRATORY_TRACT | Status: DC
  Administered 2013-04-29 – 2013-05-05 (×27): 0.63 mg via RESPIRATORY_TRACT
  Filled 2013-04-28 (×50): qty 3

## 2013-04-28 MED ORDER — SODIUM CHLORIDE 0.9 % IV BOLUS (SEPSIS)
500.0000 mL | Freq: Once | INTRAVENOUS | Status: AC
  Administered 2013-04-28: 500 mL via INTRAVENOUS

## 2013-04-28 NOTE — ED Notes (Signed)
Assisted patient onto bedpan.

## 2013-04-28 NOTE — ED Notes (Signed)
Cardiology MD at bedside.

## 2013-04-28 NOTE — Consult Note (Signed)
[name] is an 79 y.o. female.    Primary Cardiologist: Dr. Rayann Heman PCP: Noralee Space, MD  Chief Complaint:  HPI: 79 year old female has PAF with a CHADs of 3 (age, HTN, DM). She has had good LV function. Other issues include moderate hypertrophy, mild LAE and MR. Has had issues with chronic nausea with negative evaluation as well as chronic DOB with prior lung surgery (bilateral lobectomies) and known lung disease/cancer. She has had extensive atherosclerosis noted on past CT scans. Normal Myoview in October of 2013 and last echo was in August of 2014.  Was in rapid a fib on last office visit 04/07/13, recent event monitor with freq  Atrial fib episodes.   She has failed on Flecainide and Tikosyn - not planning to start amiodarone - has known severe airflow obstruction & superimposed restriction from her prev surg + obesity; already discussed her care with Dr. Rayann Heman in the office in January of 2015 - her primary limitation from our standpoint is felt to be her lungs and will always be - we have already decided to NOT start amiodarone but continue to manage her with rate control and continued anticoagulation. Her success rate for an ablation was felt to be only about 50 to 60% with ERAF.  feels that we can refer for 2nd opinion at [facility] or proceed with AVN ablation with PPM implant. We have opted to send to [facility].  She saw Dr. Clyda Hurdle and if anesthesia agrees he plans an ablation.      Today she presents with SOB by EMS after increased work of breathing since this AM.  She was hypoxic by EMS.  She has rec'd multiple breathing treatments and Solumedrol 125 mg IV.  Pt was hypotensive as well.  She was also in A fib with rate of 150 but with more albuterol nebs her HR increased to 180-190. IV cardizem is being started now.  She has also rec'd IV zithromax and rocephin.   Pro BNP elevated at 951.9. Negative troponin. EKG with atrial fib with RVR.  Past Medical History    Diagnosis Date  . Asthma   . COPD (chronic obstructive pulmonary disease)   . History of lung cancer   . Hypertension   . Paroxysmal atrial fibrillation   . Peripheral vascular disease   . Venous insufficiency   . Hyperlipidemia   . Diabetes mellitus   . Nontoxic multinodular goiter   . Obesity   . GERD (gastroesophageal reflux disease)   . History of nephrolithiasis   . History of bladder cancer   . DJD (degenerative joint disease)   . Osteopenia   . Anxiety   . History of colon polyps 07/233/2009    Tubular Adenomas  . Diverticulosis of sigmoid colon     mild  . Gastritis     moderate  . Hemorrhoids, internal   . Claustrophobia   . Thyroid nodule   . Chronic anticoagulation     Eliquis    Past Surgical History  Procedure Laterality Date  . S/p right rotator cuff repair  1991    Dr. Wonda Olds, right  . S/p turbt  10/01    Dr. Terance Hart  . S/p left tkr  2002    Dr. Gladstone Lighter  . 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 S/p d&c for removal of endometrial polyp (benign)  12/10    GYN  . Colonoscopy    . Esophagogastroduodenoscopy    . Cardioversion N/A 01/17/2013    Procedure: CARDIOVERSION/BEDSIDE (4S56);  Surgeon: Evans Lance, MD;  Location: [facility] OR;  Service: Cardiovascular;  Laterality: N/A;    Family History  Problem Relation Age of Onset  . Asthma Mother   . Heart failure Mother   . Heart attack Father   . Ulcers Father   . Colon cancer Neg Hx    Social History:  reports that she quit smoking about 19 years ago. Her smoking use included Cigarettes. She has a 19.5 pack-year smoking history. She has never used smokeless tobacco. She reports that she drinks alcohol. She reports that she does not use illicit drugs.  Allergies:  Allergies  Allergen Reactions  . Epinephrine Other (See Comments)     REACTION: nervous, convulsions  . Macrolides And Ketolides Other (See Comments)    All mycins  Cause yeast infections  . Cephalexin Diarrhea and Nausea Only  . Codeine Nausea Only  . Morphine Nausea Only  . Sulfamethoxazole-Trimethoprim Diarrhea and Nausea Only   OUTPATIENT MEDICATIONS: ALPRAZolam (XANAX) 0.25 MG tablet  take 1/2 to 1 tablet by mouth three times a day  90 tablet  1   .  BAYER MICROLET LANCETS lancets  Use as instructed once daily  100 each  5   .  Biotin 5000 MCG TABS  Take 1 tablet by mouth daily.     [name]  co-enzyme Q-10 30 MG capsule  Take 30 mg by mouth 3 (three) times daily.     .  Cyanocobalamin (VITAMIN B 12 PO)  Take by mouth. 1000 mcg once a day     .  dexlansoprazole (DEXILANT) 60 MG capsule  Take 60 mg by mouth daily.     [name]  dextromethorphan-guaiFENesin (MUCINEX DM) 30-600 MG per 12 hr tablet  Take 1 tablet by mouth every 12 (twelve) hours.     [name]  diltiazem (CARDIZEM CD) 240 MG 24 hr capsule  Take 1 capsule (240 mg total) by mouth 2 (two) times daily.  30 capsule  6   .  ELIQUIS 5 MG TABS tablet  take 1 tablet by mouth twice a day  60 tablet  6   .  famotidine (PEPCID) 20 MG tablet  Take 20 mg by mouth 2 (two) times daily.     .  Fenofibrate (FENOGLIDE) 120 MG TABS  Take 1 tablet by mouth daily.     .  furosemide (LASIX) 20 MG tablet  Take 20 mg by mouth daily.     [name]  glucose blood (BAYER CONTOUR TEST) test strip  Use as instructed once daily  32 each  6   .  irbesartan (AVAPRO) 300 MG tablet  Take 1 tablet (300 mg total) by mouth daily.  30 tablet  6   .  metFORMIN (GLUCOPHAGE) 500 MG tablet  take 1 tablet by mouth two times daily  60 tablet  6   .  methylPREDNISolone (MEDROL) 2 MG tablet  Take 2 mg by mouth daily. Take for 30 days after 4 mg's Runs out     .  methylPREDNISolone (MEDROL) 4 MG tablet  Take 4 mg by mouth daily. Ends 2/9 when patient runs out     .  mometasone (NASONEX) 50 MCG/ACT nasal spray  Place 2 sprays into the nose  daily.     .  Multiple  Vitamin (MULTIVITAMIN PO)  Take by mouth. Once a day     .  PROAIR HFA 108 (90 BASE) MCG/ACT inhaler  inhale 1 to 2 puffs every 4 hours if needed for wheezing  8.5 g  11   .  PROAIR HFA 108 (90 BASE) MCG/ACT inhaler  inhale 1 to 2 puffs every 4 hours if needed for wheezing  8.5 g  11   .  Probiotic Product (ALIGN PO)  Take 1 tablet by mouth daily.     .  promethazine (PHENERGAN) 12.5 MG tablet  take 2 tablets by mouth every 6 hours if needed for nausea  30 tablet  0   .  traMADol (ULTRAM) 50 MG tablet  take 1 tablet by mouth three times a day if needed  90 tablet  5   .  Umeclidinium-Vilanterol (ANORO ELLIPTA) 62.5-25 MCG/INH AEPB  Inhale 1 puff into the lungs daily.     [name]  UNABLE TO FIND  Oxygen 1.5 L nightly     .  Vitamin D, Ergocalciferol, (DRISDOL) 50000 UNITS CAPS  Take 1 capsule (50,000 Units total) by mouth every 7 (seven) days.  4 capsule  12       Results for orders placed during the hospital encounter of 04/28/13 (from the past 48 hour(s))  CBC WITH DIFFERENTIAL     Status: Abnormal   Collection Time    04/28/13  6:06 PM      Result Value Ref Range   WBC 8.1  4.0 - 10.5 K/uL   RBC 4.36  3.87 - 5.11 MIL/uL   Hemoglobin 14.3  12.0 - 15.0 g/dL   HCT 42.7  36.0 - 46.0 %   MCV 97.9  78.0 - 100.0 fL   MCH 32.8  26.0 - 34.0 pg   MCHC 33.5  30.0 - 36.0 g/dL   RDW 14.9  11.5 - 15.5 %   Platelets 240  150 - 400 K/uL   Neutrophils Relative % 82 (*) 43 - 77 %   Neutro Abs 6.6  1.7 - 7.7 K/uL   Lymphocytes Relative 13  12 - 46 %   Lymphs Abs 1.0  0.7 - 4.0 K/uL   Monocytes Relative 5  3 - 12 %   Monocytes Absolute 0.4  0.1 - 1.0 K/uL   Eosinophils Relative 0  0 - 5 %   Eosinophils Absolute 0.0  0.0 - 0.7 K/uL   Basophils Relative 0  0 - 1 %   Basophils Absolute 0.0  0.0 - 0.1 K/uL  COMPREHENSIVE METABOLIC PANEL     Status: Abnormal   Collection Time    04/28/13  6:06 PM      Result Value Ref Range   Sodium 138  137 - 147 mEq/L   Potassium 4.5  3.7 - 5.3 mEq/L   Chloride 96   96 - 112 mEq/L   CO2 21  19 - 32 mEq/L   Glucose, Bld 191 (*) 70 - 99 mg/dL   BUN 38 (*) 6 - 23 mg/dL   Creatinine, Ser 1.32 (*) 0.50 - 1.10 mg/dL   Calcium 9.6  8.4 - 10.5 mg/dL   Total Protein 6.1  6.0 - 8.3 g/dL   Albumin 3.2 (*) 3.5 - 5.2 g/dL   AST 22  0 - 37 U/L   Comment: HEMOLYSIS AT THIS LEVEL MAY AFFECT RESULT   ALT 31  0 - 35 U/L   Alkaline  Phosphatase 28 (*) 39 - 117 U/L   Total Bilirubin 0.7  0.3 - 1.2 mg/dL   GFR calc non Af Amer 38 (*) >90 mL/min   GFR calc Af Amer 44 (*) >90 mL/min   Comment: (NOTE)     The eGFR has been calculated using the CKD EPI equation.     This calculation has not been validated in all clinical situations.     eGFR's persistently <90 mL/min signify possible Chronic Kidney     Disease.  TROPONIN I     Status: None   Collection Time    04/28/13  6:06 PM      Result Value Ref Range   Troponin I <0.30  <0.30 ng/mL   Comment:            Due to the release kinetics of cTnI,     a negative result within the first hours     of the onset of symptoms does not rule out     myocardial infarction with certainty.     If myocardial infarction is still suspected,     repeat the test at appropriate intervals.  PRO B NATRIURETIC PEPTIDE     Status: Abnormal   Collection Time    04/28/13  6:06 PM      Result Value Ref Range   Pro B Natriuretic peptide (BNP) 951.9 (*) 0 - 450 pg/mL   Dg Chest Port 1 View  04/28/2013   CLINICAL DATA:  Shortness of breath  EXAM: PORTABLE CHEST - 1 VIEW  COMPARISON:  01/15/2013  FINDINGS: Cardiac shadow is mildly enlarged. A small hiatal hernia is noted. The lungs are well aerated with evidence of right basilar infiltrate. This is new from the prior exam. No pneumothorax is seen. Postsurgical changes are noted in the right hilum.  IMPRESSION: Right basilar infiltrate.   Electronically Signed   By: Inez Catalina M.D.   On: 04/28/2013 18:16    ROS: pt unable to talk with her respiratory failure   Blood pressure 90/58, pulse  140, temperature 97.7 F (36.5 C), temperature source Oral, resp. rate 32, height _0  (1.6 m), weight 208 lb (94.348 kg), SpO2 90.00%. PE:  Per Dr. Debara Pickett  Assessment/Plan Principal Problem:   Acute respiratory failure Active Problems:   LUNG CANCER   DIABETES MELLITUS   ASTHMA   COPD   A-fib with RVR after multiple albuterol treatments   Atrial fibrillation with RVR  PLAN:  Per Dr. Everitt Amber R Nurse Practitioner Certified [facility] Pager [phone] or after 5pm or weekends call [phone] 04/28/2013, 8:34 PM

## 2013-04-28 NOTE — H&P (Signed)
PULMONARY / CRITICAL CARE MEDICINE   Name: [name] MRN: [id number] DOB: 1934-04-05    ADMISSION DATE:  04/28/2013  REFERRING MD :  Dr. Aline Brochure  PRIMARY SERVICE: PCCM   CHIEF COMPLAINT:  SOB, PNA  BRIEF PATIENT DESCRIPTION: 79 y/o F admitted 3/25 with Afib with RVR & concern for RLL CAP  SIGNIFICANT EVENTS / STUDIES:  3/25 - Admit with Afib with RVR & concern for RLL CAP  LINES / TUBES:   CULTURES: BCx2 3/25>>> Sputum 3/25>>> U. Strep 3/25>>> U. Legionella 3/25>>.  ANTIBIOTICS: Rocephin 3/25>>> Azithro 3/25>>>  HISTORY OF PRESENT ILLNESS:  79 y/o F, former smoker, with PMH of HTN, HLD, PVD, DM, Obesity, GERD, DJD, Afb on Eliquis (refractory to multiple medications, in process of work up for ablation), Asthma, COPD on nocturnal oxygen and hx of lung cancer s/p bilateral upper lobectomies who presented to [facility] ER on 3/25 with complaints of shortness of breath & weakness.  Patient reports feeling in her usual state of health as of 3/24, woke am of 3/25 and did not feel bad until she attempted to exert herself for am activities.  Her husband arrived home from work around 2pm and gave her a breathing treatment and applied oxygen without relief.  Patient reports that over the past several weeks she has noted an increase in swelling in her lower extremities but the last few days has actually been better.  At baseline, she sleeps on 2 pillows and has not needed an increase.  She denies body aches, fevers, chills, n/v/d, changes in bowel or bladder.  She reports R sided pleuritic chest pain.  On arrival to ER, she was found to have fib with RVR - rate as high as 180's, tachypnea, sats in low 90's and hypotension.  CXR concerning for RLL infiltrate.  Patient was evaluated by Cardiology for afib, PCCM consulted for admission.    PCP:  Dr. Lenna Gilford Allergy: Dr. Neldon Mc Cards: Dr. Rayann Heman   PAST MEDICAL HISTORY :  Past Medical History  Diagnosis Date  . Asthma   . COPD (chronic obstructive  pulmonary disease)   . History of lung cancer   . Hypertension   . Paroxysmal atrial fibrillation   . Peripheral vascular disease   . Venous insufficiency   . Hyperlipidemia   . Diabetes mellitus   . Nontoxic multinodular goiter   . Obesity   . GERD (gastroesophageal reflux disease)   . History of nephrolithiasis   . History of bladder cancer   . DJD (degenerative joint disease)   . Osteopenia   . Anxiety   . History of colon polyps 07/233/2009    Tubular Adenomas  . Diverticulosis of sigmoid colon     mild  . Gastritis     moderate  . Hemorrhoids, internal   . Claustrophobia   . Thyroid nodule   . Chronic anticoagulation     Eliquis   Past Surgical History  Procedure Laterality Date  . S/p right rotator cuff repair  1991    Dr. Wonda Olds, right  . S/p turbt  10/01    Dr. Terance Hart  . S/p left tkr  2002    Dr. Gladstone Lighter  . 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 S/p d&c for removal of endometrial polyp (benign)  12/10    GYN  .  Colonoscopy    . Esophagogastroduodenoscopy    . Cardioversion N/A 01/17/2013    Procedure: CARDIOVERSION/BEDSIDE ([id number]);  Surgeon: Evans Lance, MD;  Location: [place];  Service: Cardiovascular;  Laterality: N/A;   Prior to Admission medications   Medication Sig Start Date End Date Taking? Authorizing Provider  ALPRAZolam Duanne Moron) 0.25 MG tablet take 1/2 to 1 tablet by mouth three times a day 04/06/13  Yes Noralee Space, MD  apixaban (ELIQUIS) 5 MG TABS tablet Take 5 mg by mouth 2 (two) times daily.   Yes Historical Provider, MD  Biotin 5000 MCG TABS Take 1 tablet by mouth daily.   Yes Historical Provider, MD  budesonide (PULMICORT) 0.5 MG/2ML nebulizer solution Take 0.5 mg by nebulization 2 (two) times daily.   Yes Historical Provider, MD  Coenzyme Q10 (COQ10) 100 MG CAPS Take 300 mg  by mouth daily.   Yes Historical Provider, MD  dexlansoprazole (DEXILANT) 60 MG capsule Take 60 mg by mouth daily.   Yes Historical Provider, MD  dextromethorphan-guaiFENesin (MUCINEX DM) 30-600 MG per 12 hr tablet Take 1 tablet by mouth every 12 (twelve) hours.   Yes Historical Provider, MD  diltiazem (CARDIZEM CD) 240 MG 24 hr capsule Take 240 mg by mouth 2 (two) times daily.  03/12/13  Yes Burtis Junes, NP  famotidine (PEPCID) 20 MG tablet Take 20 mg by mouth 2 (two) times daily.    Yes Historical Provider, MD  Fenofibrate (FENOGLIDE) 120 MG TABS Take 1 tablet by mouth daily.   Yes Historical Provider, MD  furosemide (LASIX) 20 MG tablet Take 20 mg by mouth daily. 01/21/13  Yes Brooke O Edmisten, PA-C  glimepiride (AMARYL) 2 MG tablet Take 2 mg by mouth 2 (two) times daily.   Yes Historical Provider, MD  irbesartan (AVAPRO) 300 MG tablet Take 1 tablet (300 mg total) by mouth daily. 01/21/13  Yes Brooke O Edmisten, PA-C  levalbuterol (XOPENEX) 0.63 MG/3ML nebulizer solution Take 0.63 mg by nebulization every 4 (four) hours as needed for wheezing or shortness of breath.   Yes Historical Provider, MD  metFORMIN (GLUCOPHAGE) 500 MG tablet Take 500 mg by mouth 2 (two) times daily with a meal.   Yes Historical Provider, MD  mometasone (NASONEX) 50 MCG/ACT nasal spray Place 2 sprays into the nose daily.   Yes Historical Provider, MD  Multiple Vitamin (MULTIVITAMIN PO) Take 1 tablet by mouth daily.    Yes Historical Provider, MD  PROAIR HFA 108 (90 BASE) MCG/ACT inhaler inhale 1 to 2 puffs every 4 hours if needed for wheezing 03/25/13  Yes Noralee Space, MD  Probiotic Product (ALIGN PO) Take 1 tablet by mouth daily.   Yes Historical Provider, MD  promethazine (PHENERGAN) 12.5 MG tablet take 2 tablets by mouth every 6 hours if needed for nausea 02/04/13  Yes Noralee Space, MD  traMADol (ULTRAM) 50 MG tablet Take 50-100 mg by mouth every 6 (six) hours as needed for moderate pain.   Yes Historical Provider, MD   Umeclidinium-Vilanterol (ANORO ELLIPTA) 62.5-25 MCG/INH AEPB Inhale 1 puff into the lungs daily.   Yes Historical Provider, MD  vitamin B-12 (CYANOCOBALAMIN) 1000 MCG tablet Take 1,000 mcg by mouth daily.   Yes Historical Provider, MD  Vitamin D, Ergocalciferol, (DRISDOL) 50000 UNITS CAPS Take 1 capsule (50,000 Units total) by mouth every 7 (seven) days. 05/07/12  Yes Noralee Space, MD   Allergies  Allergen Reactions  . Epinephrine Other (See Comments)    REACTION: nervous, convulsions  .  Macrolides And Ketolides Other (See Comments)    All mycins  Cause yeast infections  . Cephalexin Diarrhea and Nausea Only  . Codeine Nausea Only  . Morphine Nausea Only  . Sulfamethoxazole-Trimethoprim Diarrhea and Nausea Only    FAMILY HISTORY:  Family History  Problem Relation Age of Onset  . Asthma Mother   . Heart failure Mother   . Heart attack Father   . Ulcers Father   . Colon cancer Neg Hx    SOCIAL HISTORY:  reports that she quit smoking about 19 years ago. Her smoking use included Cigarettes. She has a 19.5 pack-year smoking history. She has never used smokeless tobacco. She reports that she drinks alcohol. She reports that she does not use illicit drugs.  REVIEW OF SYSTEMS:  See HPI for pertinent positives.    SUBJECTIVE:   VITAL SIGNS: Temp:  [97.7 F (36.5 C)] 97.7 F (36.5 C) (03/25 1743) Pulse Rate:  [62-174] 137 (03/25 2100) Resp:  [25-40] 29 (03/25 2100) BP: (73-121)/(40-85) 94/55 mmHg (03/25 2100) SpO2:  [90 %-94 %] 93 % (03/25 2100) FiO2 (%):  [4 %] 4 % (03/25 1745) Weight:  [208 lb (94.348 kg)] 208 lb (94.348 kg) (03/25 1743)  HEMODYNAMICS:    VENTILATOR SETTINGS: Vent Mode:  [-]  FiO2 (%):  [4 %] 4 %  INTAKE / OUTPUT: Intake/Output     03/25 0701 - 03/26 0700   I.V. (mL/kg) 300 (3.2)   Total Intake(mL/kg) 300 (3.2)   Stool 2   Total Output 2   Net +298         PHYSICAL EXAMINATION: General:  Centrally obese female, mild distress  Neuro:  AAOx4,  speech clear, MAE HEENT:  Mm pink/mosit, short/thick neck Cardiovascular:  s1s2 irr irr, afib 130's on monitor  Lungs:  resp's even/non-labored, lungs bilaterally coarse rhonchi, crackles Abdomen:  Round/soft, bsx4 active Musculoskeletal:  No acute deformities Skin:  Warm/dry, 1-2+ pitting lower extremity edema  LABS:  CBC  Recent Labs Lab 04/28/13 1806  WBC 8.1  HGB 14.3  HCT 42.7  PLT 240   Coag's No results found for this basename: APTT, INR,  in the last 168 hours BMET  Recent Labs Lab 04/28/13 1806  NA 138  K 4.5  CL 96  CO2 21  BUN 38*  CREATININE 1.32*  GLUCOSE 191*   Electrolytes  Recent Labs Lab 04/28/13 1806  CALCIUM 9.6   Sepsis Markers No results found for this basename: LATICACIDVEN, PROCALCITON, O2SATVEN,  in the last 168 hours ABG No results found for this basename: PHART, PCO2ART, PO2ART,  in the last 168 hours Liver Enzymes  Recent Labs Lab 04/28/13 1806  AST 22  ALT 31  ALKPHOS 28*  BILITOT 0.7  ALBUMIN 3.2*   Cardiac Enzymes  Recent Labs Lab 04/28/13 1806  TROPONINI <0.30  PROBNP 951.9*   Glucose No results found for this basename: GLUCAP,  in the last 168 hours  Imaging Dg Chest Port 1 View  04/28/2013   CLINICAL DATA:  Shortness of breath  EXAM: PORTABLE CHEST - 1 VIEW  COMPARISON:  01/15/2013  FINDINGS: Cardiac shadow is mildly enlarged. A small hiatal hernia is noted. The lungs are well aerated with evidence of right basilar infiltrate. This is new from the prior exam. No pneumothorax is seen. Postsurgical changes are noted in the right hilum.  IMPRESSION: Right basilar infiltrate.   Electronically Signed   By: Inez Catalina M.D.   On: 04/28/2013 18:16    ASSESSMENT /  PLAN:  PULMONARY A: RLL Infiltrate / CAP Acute Respiratory Distress COPD - nocturnal O2 dependent  Asthma Hx Lung Cancer - s/p bilateral upper lobectomies for BAC P:   -admit to ICU for respiratory observation -pt will not tolerate bipap due to  claustrophobia, if distress, agreeable to intubation for short term needs.   -scheduled xopenex / atrovent + PRN xopenex -pulmicort -trend CXR -no indication for steroids at this time  CARDIOVASCULAR A:  Atrial Fibrillation - w RVR.  Chronic, resistant to multiple medications pending ablation.  Troponin Neg HTN HLD P:  -Cardiology Consult -cardizem gtt for rate control  -hold lasix, irbesartan -assess EKG  RENAL A:   Acute Kidney Injury P:   -hold nephrotoxic agents -gentle hydration, NS at 40 ml/hr -trend BMP  GASTROINTESTINAL A:   GERD Morbid Obesity P:   -NPO x meds -continue home PPI & H2 blocker  HEMATOLOGIC A:   Chronic Anticoagulation  P:  -continue apixaban -monitor for bleeding / CBC  INFECTIOUS A:   Concern RLL CAP P:   -abx as above -cultures as above  ENDOCRINE A:   Diabetes    P:   -SSI -hold metformin, amaryl  NEUROLOGIC A:   Anxiety  Claustrophobia  P:   -continue home xanax 0.25 TID PRN  Noe Gens, NP-C [facility] Pgr: [phone] or [phone]   04/28/2013, 9:18 PM   Reviewed above and examined.    She has respiratory failure from CAP causing AECOPD and A fib with RVR.  Will continue Abx, steroids, and neb tx.  Appreciate help from cardiology.  Will try on BiPAP as tolerated >> if no improvement or unable to tolerate BiPAP might then need intubation.  CC time 40 minutes.  Chesley Mires, MD [facility]monary[facility] 04/28/2013 11:50 PM Pager:  [phone] After 3pm call: [phone]

## 2013-04-28 NOTE — ED Notes (Signed)
Report called to 2H 

## 2013-04-28 NOTE — ED Notes (Signed)
Patient arrived via [facility] from home with increased work of breathing since this morning. EMS states she was hypoxic and tripod breathing while patient was on her home neb treatment. She received Albuterol 10mg , Atrovent 0.5mg , Solumedrol 125mg  IV, Zofran 4mg  IV. Patient is hypotensive, EMS monitor showed AFib 150 (history of).

## 2013-04-28 NOTE — ED Provider Notes (Signed)
CSN: [id number]     Arrival date & time 04/28/13  1733 History   First MD Initiated Contact with Patient 04/28/13 1734     Chief Complaint  Patient presents with  . Respiratory Distress     (Consider location/radiation/quality/duration/timing/severity/associated sxs/prior Treatment) Patient is a 79 y.o. female presenting with shortness of breath. The history is provided by the patient.  Shortness of Breath Severity:  Moderate Onset quality:  Gradual Duration:  12 hours Timing:  Constant Progression:  Worsening Chronicity:  Recurrent Context comment:  At rest Relieved by:  Nothing Worsened by:  Nothing tried Ineffective treatments: alb neb. Associated symptoms: cough (mild) and wheezing   Associated symptoms: no abdominal pain, no chest pain, no fever, no headaches, no neck pain and no vomiting     Past Medical History  Diagnosis Date  . Asthma   . COPD (chronic obstructive pulmonary disease)   . History of lung cancer   . Hypertension   . Paroxysmal atrial fibrillation   . Peripheral vascular disease   . Venous insufficiency   . Hyperlipidemia   . Diabetes mellitus   . Nontoxic multinodular goiter   . Obesity   . GERD (gastroesophageal reflux disease)   . History of nephrolithiasis   . History of bladder cancer   . DJD (degenerative joint disease)   . Osteopenia   . Anxiety   . History of colon polyps 07/233/2009    Tubular Adenomas  . Diverticulosis of sigmoid colon     mild  . Gastritis     moderate  . Hemorrhoids, internal   . Claustrophobia   . Thyroid nodule   . Chronic anticoagulation     Eliquis   Past Surgical History  Procedure Laterality Date  . S/p right rotator cuff repair  1991    Dr. Wonda Olds, right  . S/p turbt  10/01    Dr. Terance Hart  . S/p left tkr  2002    Dr. Gladstone Lighter  . 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 S/p d&c for removal of endometrial polyp (benign)  12/10    GYN  . Colonoscopy    . Esophagogastroduodenoscopy    . Cardioversion N/A 01/17/2013    Procedure: CARDIOVERSION/BEDSIDE (3T46);  Surgeon: Evans Lance, MD;  Location: [facility] OR;  Service: Cardiovascular;  Laterality: N/A;   Family History  Problem Relation Age of Onset  . Asthma Mother   . Heart failure Mother   . Heart attack Father   . Ulcers Father   . Colon cancer Neg Hx    History  Substance Use Topics  . Smoking status: Former Smoker -- 0.50 packs/day for 39 years    Types: Cigarettes    Quit date: 02/04/1994  . Smokeless tobacco: Never Used  . Alcohol Use: Yes     Comment: Occasionally   OB History   Grav Para Term Preterm Abortions TAB SAB Ect Mult Living                 Review of Systems  Constitutional: Negative for fever and fatigue.  HENT: Negative for congestion and drooling.   Eyes: Negative for pain.  Respiratory: Positive for cough (mild), shortness of breath and wheezing.   Cardiovascular: Negative for chest pain.  Gastrointestinal: Negative for nausea, vomiting, abdominal pain and diarrhea.  Genitourinary: Negative for dysuria and hematuria.  Musculoskeletal: Negative for back pain, gait problem and neck pain.  Skin: Negative for color change.  Neurological: Negative for dizziness and headaches.  Hematological: Negative for adenopathy.  Psychiatric/Behavioral: Negative for behavioral problems.  All other systems reviewed and are negative.      Allergies  Epinephrine; Cephalexin; Codeine; Erythromycin; Morphine; and Sulfamethoxazole-trimethoprim  Home Medications   Current Outpatient Rx  Name  Route  Sig  Dispense  Refill  . ALPRAZolam (XANAX) 0.25 MG tablet      take 1/2 to 1 tablet by mouth three times a day   90 tablet   1   . Biotin 5000 MCG TABS   Oral   Take 1 tablet by mouth daily.         . budesonide (PULMICORT) 0.5 MG/2ML  nebulizer solution   Nebulization   Take 0.5 mg by nebulization 2 (two) times daily.         . Coenzyme Q10 (COQ10) 100 MG CAPS   Oral   Take 300 mg by mouth daily.         [name] dexlansoprazole (DEXILANT) 60 MG capsule   Oral   Take 60 mg by mouth daily.         [name] dextromethorphan-guaiFENesin (MUCINEX DM) 30-600 MG per 12 hr tablet   Oral   Take 1 tablet by mouth every 12 (twelve) hours.         [name] diltiazem (CARDIZEM CD) 240 MG 24 hr capsule   Oral   Take 240 mg by mouth 2 (two) times daily.          [name] ELIQUIS 5 MG TABS tablet      take 1 tablet by mouth twice a day   60 tablet   6   . famotidine (PEPCID) 20 MG tablet   Oral   Take 20 mg by mouth 2 (two) times daily.          . Fenofibrate (FENOGLIDE) 120 MG TABS   Oral   Take 1 tablet by mouth daily.         . furosemide (LASIX) 20 MG tablet   Oral   Take 20 mg by mouth daily.         . irbesartan (AVAPRO) 300 MG tablet   Oral   Take 1 tablet (300 mg total) by mouth daily.   30 tablet   6   . metFORMIN (GLUCOPHAGE) 500 MG tablet      take 1 tablet by mouth two times daily   60 tablet   6   . mometasone (NASONEX) 50 MCG/ACT nasal spray   Nasal   Place 2 sprays into the nose daily.         . Multiple Vitamin (MULTIVITAMIN PO)   Oral   Take by mouth. Once a day          . PROAIR HFA 108 (90 BASE) MCG/ACT inhaler      inhale 1 to 2 puffs every 4 hours if needed for wheezing   8.5 g   11   . Probiotic Product (ALIGN PO)   Oral   Take 1 tablet by mouth daily.         . promethazine (PHENERGAN) 12.5 MG tablet      take 2 tablets by mouth every 6 hours if needed for nausea   30 tablet   0   . traMADol (ULTRAM) 50 MG tablet      take  1 tablet by mouth three times a day if needed   90 tablet   5   . Umeclidinium-Vilanterol (ANORO ELLIPTA) 62.5-25 MCG/INH AEPB   Inhalation   Inhale 1 puff into the lungs daily.         . vitamin B-12 (CYANOCOBALAMIN) 1000 MCG tablet    Oral   Take 1,000 mcg by mouth daily.         . Vitamin D, Ergocalciferol, (DRISDOL) 50000 UNITS CAPS   Oral   Take 1 capsule (50,000 Units total) by mouth every 7 (seven) days.   4 capsule   12    BP 101/68  Pulse 169  Temp(Src) 97.7 F (36.5 C) (Oral)  Resp 40  Ht 5\' 3"  (1.6 m)  Wt 208 lb (94.348 kg)  BMI 36.85 kg/m2  SpO2 92% Physical Exam  Nursing note and vitals reviewed. Constitutional: She is oriented to person, place, and time. She appears well-developed and well-nourished.  HENT:  Head: Normocephalic and atraumatic.  Mouth/Throat: Oropharynx is clear and moist. No oropharyngeal exudate.  Eyes: Conjunctivae and EOM are normal. Pupils are equal, round, and reactive to light.  Neck: Normal range of motion. Neck supple.  Cardiovascular: Normal heart sounds and intact distal pulses.  Exam reveals no gallop and no friction rub.   No murmur heard. Afib HR 170's  Pulmonary/Chest: She is in respiratory distress.  Diminished breath sounds bilaterally.  Abdominal: Soft. Bowel sounds are normal. There is no tenderness. There is no rebound and no guarding.  Musculoskeletal: Normal range of motion. She exhibits no edema and no tenderness.  Neurological: She is alert and oriented to person, place, and time.  Skin: Skin is warm and dry.  Psychiatric: She has a normal mood and affect. Her behavior is normal.    ED Course  Procedures (including critical care time) Labs Review Labs Reviewed  CBC WITH DIFFERENTIAL - Abnormal; Notable for the following:    Neutrophils Relative % 82 (*)    All other components within normal limits  COMPREHENSIVE METABOLIC PANEL - Abnormal; Notable for the following:    Glucose, Bld 191 (*)    BUN 38 (*)    Creatinine, Ser 1.32 (*)    Albumin 3.2 (*)    Alkaline Phosphatase 28 (*)    GFR calc non Af Amer 38 (*)    GFR calc Af Amer 44 (*)    All other components within normal limits  PRO B NATRIURETIC PEPTIDE - Abnormal; Notable for the  following:    Pro B Natriuretic peptide (BNP) 951.9 (*)    All other components within normal limits  CULTURE, BLOOD (ROUTINE X 2)  CULTURE, BLOOD (ROUTINE X 2)  CULTURE, EXPECTORATED SPUTUM-ASSESSMENT  GRAM STAIN  TROPONIN I  CBC  BASIC METABOLIC PANEL  LEGIONELLA ANTIGEN, URINE  STREP PNEUMONIAE URINARY ANTIGEN  LACTIC ACID, PLASMA   Imaging Review Dg Chest Port 1 View  04/28/2013   CLINICAL DATA:  Shortness of breath  EXAM: PORTABLE CHEST - 1 VIEW  COMPARISON:  01/15/2013  FINDINGS: Cardiac shadow is mildly enlarged. A small hiatal hernia is noted. The lungs are well aerated with evidence of right basilar infiltrate. This is new from the prior exam. No pneumothorax is seen. Postsurgical changes are noted in the right hilum.  IMPRESSION: Right basilar infiltrate.   Electronically Signed   By: Inez Catalina M.D.   On: 04/28/2013 18:16     EKG Interpretation   Date/Time:  Wednesday April 28 2013 17:48:27 EDT  Ventricular Rate:  184 PR Interval:    QRS Duration: 75 QT Interval:  257 QTC Calculation: 450 R Axis:   21 Text Interpretation:  Atrial fibrillation with rapid V-rate Low voltage,  precordial leads ST depression, probably rate related Confirmed by  Waylin Dorko  MD, Arville Postlewaite ([id number]) on 04/28/2013 5:57:12 PM     CRITICAL CARE Performed by: Pamella Pert, S Total critical care time: 30 min Critical care time was exclusive of separately billable procedures and treating other patients. Critical care was necessary to treat or prevent imminent or life-threatening deterioration. Critical care was time spent personally by me on the following activities: development of treatment plan with patient and/or surrogate as well as nursing, discussions with consultants, evaluation of patient's response to treatment, examination of patient, obtaining history from patient or surrogate, ordering and performing treatments and interventions, ordering and review of laboratory studies, ordering and  review of radiographic studies, pulse oximetry and re-evaluation of patient's condition.  MDM   Final diagnoses:  Atrial fibrillation with RVR  Acute respiratory failure  Malignant neoplasm of bronchus and lung, unspecified site  Obesity, unspecified  Unspecified asthma(493.90)    6:11 PM 79 y.o. female w hx of COPD, prior lung cancer and has had prior lung surgery x 2, HTN, DM, HLD, PVD who presents with shortness of breath which began this morning and has gradually worsened throughout the day. She has used albuterol breathing treatments at home with minimal relief. She got Solu-Medrol and a DuoNeb in route by EMS. On exam currently she is tachypneic and short of breath. She is able to speak in short sentences. Her heart rate is in the 170-190 range. She did not feel palpitations or chest pain on exam. She has decreased breath sounds bilaterally. Of note she was admitted in December of 2014 and cardioverted for symptomatic A. fib. She is currently on cardizem. Will get rate control w/ IV cardzem, breathing tx, CXR. ECG shows narrow complex afib w/ RVR. No previous ecg's that I can find w/ evidence of pre-excitation.   I ordered Rocephin/Azithro for infiltrate noted on CXR. HR dec to 130's w/ cardizem. BP as low as 70's/40's. Mild improvement w/ 500cc bolus x 2. Pt remains sob s/p several breathing tx's including a continuous alb neb. CC consulted for evaluation.   The patient will be admitted to Critical Care. Cardiology has been consulted and has seen the pt. Any medications given in the ED during this visit are listed below:  Medications  diltiazem (CARDIZEM) 1 mg/mL load via infusion 20 mg (0 mg Intravenous Stopped 04/28/13 2104)    And  diltiazem (CARDIZEM) 100 mg in dextrose 5 % 100 mL infusion (10 mg/hr Intravenous New Bag/Given 04/28/13 2106)  aspirin chewable tablet 324 mg (not administered)    Or  aspirin suppository 300 mg (not administered)  0.9 %  sodium chloride infusion (not  administered)  0.9 %  sodium chloride infusion (not administered)  cefTRIAXone (ROCEPHIN) 1 g in dextrose 5 % 50 mL IVPB (not administered)  azithromycin (ZITHROMAX) 500 mg in dextrose 5 % 250 mL IVPB (not administered)  levalbuterol (XOPENEX) nebulizer solution 0.63 mg (not administered)  levalbuterol (XOPENEX) nebulizer solution 0.63 mg (not administered)  ipratropium (ATROVENT) nebulizer solution 0.5 mg (0.5 mg Nebulization Given 04/28/13 2206)  ALPRAZolam (XANAX) tablet 0.25 mg (not administered)  apixaban (ELIQUIS) tablet 5 mg (not administered)  budesonide (PULMICORT) nebulizer solution 0.5 mg (0.5 mg Nebulization Not Given 04/28/13 2210)  pantoprazole (PROTONIX) EC tablet 40 mg (not  administered)  dextromethorphan-guaiFENesin ([place] DM) 30-600 MG per 12 hr tablet 1 tablet (not administered)  fluticasone (FLONASE) 50 MCG/ACT nasal spray 1 spray (not administered)  bifidobacterium infantis (ALIGN) capsule 1 capsule (not administered)  insulin aspart (novoLOG) injection 0-20 Units (not administered)  albuterol (PROVENTIL) (2.5 MG/3ML) 0.083% nebulizer solution 5 mg (5 mg Nebulization Given 04/28/13 1810)  sodium chloride 0.9 % bolus 500 mL (0 mLs Intravenous Stopped 04/28/13 2008)  cefTRIAXone (ROCEPHIN) 1 g in dextrose 5 % 50 mL IVPB (0 g Intravenous Stopped 04/28/13 2056)  azithromycin (ZITHROMAX) 500 mg in dextrose 5 % 250 mL IVPB (0 mg Intravenous Stopped 04/28/13 2056)  ondansetron (ZOFRAN) injection 4 mg (4 mg Intravenous Given 04/28/13 1939)  albuterol (PROVENTIL,VENTOLIN) solution continuous neb (10 mg/hr Nebulization Given 04/28/13 2033)  sodium chloride 0.9 % bolus 500 mL (500 mLs Intravenous New Bag/Given 04/28/13 2203)       Blanchard Kelch, MD 04/29/13 [phone]

## 2013-04-28 NOTE — Consult Note (Signed)
Pt. Seen and examined. Agree with the NP/PA-C note as written.  Pleasant 79 yo female who is in moderate respiratory distress. She was difficult to obtain a history from due to rapid shallow breathing. Per her husband and the notes, she has PAF with very rapid ventricular response. She unfortunately has failed several anti-arrythmic medications before as well as cardioversion and was referred by Dr. Rayann Heman to Dr. Lehman Prom for a second opinion regarding selective a-fib ablation versus AVN ablation and pacemaker. She has very little lung function after a history of lung CA and resection as well as asthma/airflow restriction.   She now presents again with increased work of breathing and despite breathing treatments and steroids, continues to struggle.  Exam significant for wheezes, poor airflow, diffuse crackles, irregular tachycardia, trace to 1+ LE edema, speaking in short sentences, skin pale, warm, abdomen soft, obese, non-tender, +BS. Neuro: Follows commands.  Impression: 1. Acute hypoxic respiratory failure 2. A-fib with RVR  Plan: 1. Due to her tenuous respiratory status - she is most appropriate for the PCCM service, we are happy to     consult for a-fib management. 2. Agree with IV cardizem as bp tolerates for rate control. 3. Could consider digitalization if needed for additional rate control. 4. Consult EP service in the AM for their recommendations.  Once stabilized, may need to consider ablation     or possible transfer for ablation.  Thanks for consulting [place].  Pixie Casino, MD, [facility] Attending Cardiologist [place]

## 2013-04-28 NOTE — ED Notes (Signed)
Family at bedside. 

## 2013-04-29 ENCOUNTER — Encounter (HOSPITAL_COMMUNITY): Payer: Self-pay | Admitting: *Deleted

## 2013-04-29 ENCOUNTER — Inpatient Hospital Stay (HOSPITAL_COMMUNITY): Payer: Medicare Other

## 2013-04-29 LAB — CBC
HEMATOCRIT: 41.4 % (ref 36.0–46.0)
Hemoglobin: 13.5 g/dL (ref 12.0–15.0)
MCH: 32.5 pg (ref 26.0–34.0)
MCHC: 32.6 g/dL (ref 30.0–36.0)
MCV: 99.5 fL (ref 78.0–100.0)
PLATELETS: 200 10*3/uL (ref 150–400)
RBC: 4.16 MIL/uL (ref 3.87–5.11)
RDW: 15.4 % (ref 11.5–15.5)
WBC: 4.1 10*3/uL (ref 4.0–10.5)

## 2013-04-29 LAB — BASIC METABOLIC PANEL
BUN: 45 mg/dL — ABNORMAL HIGH (ref 6–23)
CO2: 16 meq/L — AB (ref 19–32)
Calcium: 8.7 mg/dL (ref 8.4–10.5)
Chloride: 98 mEq/L (ref 96–112)
Creatinine, Ser: 2.02 mg/dL — ABNORMAL HIGH (ref 0.50–1.10)
GFR calc non Af Amer: 23 mL/min — ABNORMAL LOW (ref >90)
GFR, EST AFRICAN AMERICAN: 26 mL/min — AB (ref >90)
Glucose, Bld: 331 mg/dL — ABNORMAL HIGH (ref 70–99)
POTASSIUM: 5.3 meq/L (ref 3.7–5.3)
Sodium: 136 mEq/L — ABNORMAL LOW (ref 137–147)

## 2013-04-29 LAB — GLUCOSE, CAPILLARY
GLUCOSE-CAPILLARY: 144 mg/dL — AB (ref 70–99)
GLUCOSE-CAPILLARY: 180 mg/dL — AB (ref 70–99)
GLUCOSE-CAPILLARY: 274 mg/dL — AB (ref 70–99)
GLUCOSE-CAPILLARY: 298 mg/dL — AB (ref 70–99)
GLUCOSE-CAPILLARY: 309 mg/dL — AB (ref 70–99)
Glucose-Capillary: 186 mg/dL — ABNORMAL HIGH (ref 70–99)
Glucose-Capillary: 158 mg/dL — ABNORMAL HIGH (ref 70–99)

## 2013-04-29 LAB — STREP PNEUMONIAE URINARY ANTIGEN: STREP PNEUMO URINARY ANTIGEN: NEGATIVE

## 2013-04-29 LAB — MRSA PCR SCREENING: MRSA BY PCR: NEGATIVE

## 2013-04-29 MED ORDER — METHYLPREDNISOLONE SODIUM SUCC 40 MG IJ SOLR
40.0000 mg | Freq: Four times a day (QID) | INTRAMUSCULAR | Status: DC
  Administered 2013-04-29 – 2013-05-01 (×10): 40 mg via INTRAVENOUS
  Filled 2013-04-29 (×17): qty 1

## 2013-04-29 MED ORDER — ONDANSETRON HCL 4 MG/2ML IJ SOLN
4.0000 mg | Freq: Four times a day (QID) | INTRAMUSCULAR | Status: DC | PRN
  Administered 2013-04-29 (×2): 4 mg via INTRAVENOUS
  Filled 2013-04-29 (×2): qty 2

## 2013-04-29 MED ORDER — INSULIN GLARGINE 100 UNIT/ML ~~LOC~~ SOLN
10.0000 [IU] | Freq: Every day | SUBCUTANEOUS | Status: DC
  Administered 2013-04-29 – 2013-05-03 (×5): 10 [IU] via SUBCUTANEOUS
  Filled 2013-04-29 (×7): qty 0.1

## 2013-04-29 MED ORDER — FUROSEMIDE 10 MG/ML IJ SOLN
40.0000 mg | Freq: Three times a day (TID) | INTRAMUSCULAR | Status: DC
  Administered 2013-04-29: 40 mg via INTRAVENOUS
  Filled 2013-04-29: qty 4

## 2013-04-29 MED ORDER — FUROSEMIDE 10 MG/ML IJ SOLN
40.0000 mg | Freq: Three times a day (TID) | INTRAMUSCULAR | Status: AC
  Administered 2013-04-29: 40 mg via INTRAVENOUS
  Filled 2013-04-29: qty 4

## 2013-04-29 MED ORDER — BIOTENE DRY MOUTH MT LIQD
15.0000 mL | OROMUCOSAL | Status: DC
  Administered 2013-04-29 – 2013-05-12 (×72): 15 mL via OROMUCOSAL

## 2013-04-29 MED ORDER — IPRATROPIUM BROMIDE 0.02 % IN SOLN
RESPIRATORY_TRACT | Status: AC
  Administered 2013-04-29: 0.5 mg
  Filled 2013-04-29: qty 2.5

## 2013-04-29 NOTE — Progress Notes (Signed)
Pt. Stated that she felt nauseous & that she was going to be sick. Pt. Was taken off BIPAP & placed on a 55% venti mask. Pt. Is doing well at this time. Vitals are as follows; HR: 107, SAT's: 93%, BP: 99/50, RR: 24.

## 2013-04-29 NOTE — Progress Notes (Signed)
Patient Name: [name] Date of Encounter: 04/29/2013  Principal Problem:   Acute respiratory failure Active Problems:   LUNG CANCER   DIABETES MELLITUS   ASTHMA   COPD   A-fib with RVR after multiple albuterol treatments   Atrial fibrillation with RVR   CAP (community acquired pneumonia)   Length of Stay: 1  SUBJECTIVE  Slight improvement. Ventricular rate control is generally satisfactory, considering she still has substantial respiratory distress (110s on diltiazem 7.5 mg/h)  CURRENT MEDS . acidophilus  1 capsule Oral Daily  . antiseptic oral rinse  15 mL Mouth Rinse 6 times per day  . apixaban  5 mg Oral BID  . azithromycin  500 mg Intravenous Q24H  . budesonide  0.5 mg Nebulization BID  . cefTRIAXone (ROCEPHIN)  IV  1 g Intravenous Q24H  . dextromethorphan-guaiFENesin  1 tablet Oral BID  . famotidine  20 mg Oral BID  . fluticasone  1 spray Each Nare Daily  . furosemide  40 mg Intravenous Q8H  . insulin aspart  0-20 Units Subcutaneous 6 times per day  . insulin glargine  10 Units Subcutaneous QHS  . ipratropium  0.5 mg Nebulization Q6H  . levalbuterol  0.63 mg Nebulization Q6H  . methylPREDNISolone (SOLU-MEDROL) injection  40 mg Intravenous Q6H  . pantoprazole  40 mg Oral Daily    OBJECTIVE   Intake/Output Summary (Last 24 hours) at 04/29/13 0855 Last data filed at 04/29/13 0800  Gross per 24 hour  Intake  682.5 ml  Output      2 ml  Net  680.5 ml   Filed Weights   04/28/13 1743 04/28/13 2342 04/29/13 0427  Weight: 94.348 kg (208 lb) 92.4 kg (203 lb 11.3 oz) 93.1 kg (205 lb 4 oz)    PHYSICAL EXAM Filed Vitals:   04/29/13 0427 04/29/13 0500 04/29/13 0600 04/29/13 0700  BP:  112/52 121/63 121/55  Pulse:  114 96 110  Temp:    98.2 F (36.8 C)  TempSrc:    Oral  Resp:  27 28 28   Height:      Weight: 93.1 kg (205 lb 4 oz)     SpO2:  93% 95% 93%   General: Alert, oriented x3, mild respiratory distress Head: no evidence of trauma, PERRL, EOMI,  no exophtalmos or lid lag, no myxedema, no xanthelasma; normal ears, nose and oropharynx Neck: normal jugular venous pulsations and no hepatojugular reflux; brisk carotid pulses without delay and no carotid bruits Chest: clear to auscultation, no signs of consolidation by percussion or palpation, normal fremitus, symmetrical and full respiratory excursions Cardiovascular: normal position and quality of the apical impulse, regular rhythm, normal first and second heart sounds, no rubs or gallops, no murmur Abdomen: no tenderness or distention, no masses by palpation, no abnormal pulsatility or arterial bruits, normal bowel sounds, no hepatosplenomegaly Extremities: no clubbing, cyanosis or edema; 2+ radial, ulnar and brachial pulses bilaterally; 2+ right femoral, posterior tibial and dorsalis pedis pulses; 2+ left femoral, posterior tibial and dorsalis pedis pulses; no subclavian or femoral bruits Neurological: grossly nonfocal  LABS  CBC  Recent Labs  04/28/13 1806 04/29/13 0321  WBC 8.1 4.1  NEUTROABS 6.6  --   HGB 14.3 13.5  HCT 42.7 41.4  MCV 97.9 99.5  PLT 240 163   Basic Metabolic Panel  Recent Labs  04/28/13 1806 04/29/13 0321  NA 138 136*  K 4.5 5.3  CL 96 98  CO2 21 16*  GLUCOSE 191* 331*  BUN  38* 45*  CREATININE 1.32* 2.02*  CALCIUM 9.6 8.7   Liver Function Tests  Recent Labs  04/28/13 1806  AST 22  ALT 31  ALKPHOS 28*  BILITOT 0.7  PROT 6.1  ALBUMIN 3.2*   No results found for this basename: LIPASE, AMYLASE,  in the last 72 hours Cardiac Enzymes  Recent Labs  04/28/13 1806  TROPONINI <0.30    Radiology Studies Imaging results have been reviewed and Dg Chest Port 1 View  04/29/2013   CLINICAL DATA:  Short of breath  EXAM: PORTABLE CHEST - 1 VIEW  COMPARISON:  DG CHEST 1V PORT dated 04/28/2013  FINDINGS: Moderate cardiomegaly. Right basilar consolidation unchanged. Left lung is relatively clear. No pneumothorax.  IMPRESSION: Stable right basilar  consolidation.   Electronically Signed   By: Maryclare Bean M.D.   On: 04/29/2013 07:57   Dg Chest Port 1 View  04/28/2013   CLINICAL DATA:  Shortness of breath  EXAM: PORTABLE CHEST - 1 VIEW  COMPARISON:  01/15/2013  FINDINGS: Cardiac shadow is mildly enlarged. A small hiatal hernia is noted. The lungs are well aerated with evidence of right basilar infiltrate. This is new from the prior exam. No pneumothorax is seen. Postsurgical changes are noted in the right hilum.  IMPRESSION: Right basilar infiltrate.   Electronically Signed   By: Inez Catalina M.D.   On: 04/28/2013 18:16    TELE AF with RVR  ECG AF with extreme RVR  ASSESSMENT AND PLAN   She has persistent/near-permanent AF. I think RVR is reactive to respiratory illness and its treatment with beta agonists. It is unlikely that AF triggered her acute decompensation. Due to extensive prior surgery, CXR is harder to evaluate but does suggest a new problem in RLL. WBC normal, afebrile. Clinically and radiologically she is not in acute CHF, but BNP is high. Acute worsening of renal function suggests conflicting evidence re: her volume status. Overall, I think we should not diurese further. Continue with rate control meds. I do not think she will have good results with RF ablation for AF, but may benefit from AV node ablation and permanent ventricular pacing. Will let Dr. Rayann Heman know she is here.   Sanda Klein, MD, [facility] [phone] office [phone] pager 04/29/2013 8:55 AM

## 2013-04-29 NOTE — Progress Notes (Signed)
PULMONARY / CRITICAL CARE MEDICINE   Name: [name] MRN: [id number] DOB: 06/18/1934    ADMISSION DATE:  04/28/2013  REFERRING MD :  Dr. Aline Brochure  PRIMARY SERVICE: PCCM   CHIEF COMPLAINT:  SOB, PNA  BRIEF PATIENT DESCRIPTION: 79 y/o F admitted 3/25 with Afib with RVR & concern for RLL CAP  SIGNIFICANT EVENTS / STUDIES:  3/25 - Admit with Afib with RVR & concern for RLL CAP  LINES / TUBES: PIV  CULTURES: BCx2 3/25>>> Sputum 3/25>>> U. Strep 3/25>>> U. Legionella 3/25>>.  ANTIBIOTICS: Rocephin 3/25>>> Azithro 3/25>>>  SUBJECTIVE: No events overnight, on BiPAP.  VITAL SIGNS: Temp:  [97.7 F (36.5 C)-98.4 F (36.9 C)] 98.2 F (36.8 C) (03/26 0700) Pulse Rate:  [31-174] 110 (03/26 0700) Resp:  [24-40] 28 (03/26 0700) BP: (73-122)/(34-85) 121/55 mmHg (03/26 0700) SpO2:  [81 %-97 %] 95 % (03/26 0907) FiO2 (%):  [4 %-55 %] 50 % (03/26 0907) Weight:  [203 lb 11.3 oz (92.4 kg)-208 lb (94.348 kg)] 205 lb 4 oz (93.1 kg) (03/26 0427)  HEMODYNAMICS:    VENTILATOR SETTINGS: Vent Mode:  [-] BIPAP FiO2 (%):  [4 %-55 %] 50 % Set Rate:  [12 bmp] 12 bmp  INTAKE / OUTPUT: Intake/Output     03/25 0701 - 03/26 0700 03/26 0701 - 03/27 0700   I.V. (mL/kg) 635 (6.8) 47.5 (0.5)   Total Intake(mL/kg) 635 (6.8) 47.5 (0.5)   Urine (mL/kg/hr) 200 350 (0.9)   Stool 2    Total Output 202 350   Net +433 -302.5        Stool Occurrence 1 x 1 x     PHYSICAL EXAMINATION: General:  Centrally obese female, mild distress  Neuro:  AAOx4, speech clear, MAE HEENT:  Mm pink/mosit, short/thick neck Cardiovascular:  s1s2 irr irr, afib 130's on monitor  Lungs:  resp's even/non-labored, lungs bilaterally coarse rhonchi, crackles Abdomen:  Round/soft, bsx4 active Musculoskeletal:  No acute deformities Skin:  Warm/dry, 1-2+ pitting lower extremity edema  LABS:  CBC  Recent Labs Lab 04/28/13 1806 04/29/13 0321  WBC 8.1 4.1  HGB 14.3 13.5  HCT 42.7 41.4  PLT 240 200    Coag's No results found for this basename: APTT, INR,  in the last 168 hours BMET  Recent Labs Lab 04/28/13 1806 04/29/13 0321  NA 138 136*  K 4.5 5.3  CL 96 98  CO2 21 16*  BUN 38* 45*  CREATININE 1.32* 2.02*  GLUCOSE 191* 331*   Electrolytes  Recent Labs Lab 04/28/13 1806 04/29/13 0321  CALCIUM 9.6 8.7   Sepsis Markers No results found for this basename: LATICACIDVEN, PROCALCITON, O2SATVEN,  in the last 168 hours ABG No results found for this basename: PHART, PCO2ART, PO2ART,  in the last 168 hours Liver Enzymes  Recent Labs Lab 04/28/13 1806  AST 22  ALT 31  ALKPHOS 28*  BILITOT 0.7  ALBUMIN 3.2*   Cardiac Enzymes  Recent Labs Lab 04/28/13 1806  TROPONINI <0.30  PROBNP 951.9*   Glucose  Recent Labs Lab 04/28/13 2351 04/29/13 0403 04/29/13 0600 04/29/13 0735  GLUCAP 266* 309* 298* 274*    Imaging Dg Chest Port 1 View  04/29/2013   CLINICAL DATA:  Short of breath  EXAM: PORTABLE CHEST - 1 VIEW  COMPARISON:  DG CHEST 1V PORT dated 04/28/2013  FINDINGS: Moderate cardiomegaly. Right basilar consolidation unchanged. Left lung is relatively clear. No pneumothorax.  IMPRESSION: Stable right basilar consolidation.   Electronically Signed   By: Toni Amend  Hoss M.D.   On: 04/29/2013 07:57   Dg Chest Port 1 View  04/28/2013   CLINICAL DATA:  Shortness of breath  EXAM: PORTABLE CHEST - 1 VIEW  COMPARISON:  01/15/2013  FINDINGS: Cardiac shadow is mildly enlarged. A small hiatal hernia is noted. The lungs are well aerated with evidence of right basilar infiltrate. This is new from the prior exam. No pneumothorax is seen. Postsurgical changes are noted in the right hilum.  IMPRESSION: Right basilar infiltrate.   Electronically Signed   By: Inez Catalina M.D.   On: 04/28/2013 18:16    ASSESSMENT / PLAN:  PULMONARY A: RLL Infiltrate / CAP Acute Respiratory Distress COPD - nocturnal O2 dependent  Asthma Hx Lung Cancer - s/p bilateral upper lobectomies for BAC P:    - BiPAP for support until able to diurese.  - If unable to tolerate BiPAP then will intubate.   - Scheduled xopenex / atrovent + PRN xopenex - Pulmicort - Trend CXR - No indication for steroids at this time  CARDIOVASCULAR A:  Atrial Fibrillation - w RVR.  Chronic, resistant to multiple medications pending ablation.  Troponin Neg HTN HLD P:  - Cardiology Consult called. - Cardizem gtt for rate control. - Hold lasix and ARB given renal function. - Assess EKG  RENAL A:   Acute Kidney Injury P:   - Hold nephrotoxic agents - KVO IVF. - Lasix for volume and hyperkalemia and metabolic acidosis. - Spoke with family and patient at length regarding dialysis, they are currently thinking about it but they were informed that we do not need it right now. - Trend BMP - Replace electrolytes as indicated.  GASTROINTESTINAL A:   GERD Morbid Obesity P:   - May begin clear liquid diet. - Continue home PPI & H2 blocker  HEMATOLOGIC A:   Chronic Anticoagulation  P:  - Continue apixaban - Monitor for bleeding / CBC  INFECTIOUS A:   Concern RLL CAP P:   - Abx as above - Cultures as above  ENDOCRINE A:   Diabetes    P:   - SSI - Hold metformin, amaryl - Lantus 10 units ordered.  NEUROLOGIC A:   Anxiety  Claustrophobia  P:   - Continue home xanax 0.25 TID PRN  CC time 40 minutes.  Rush Farmer, M.D. [facility] Pulmonary/Critical Care Medicine. Pager: [phone]. After hours pager: [phone].

## 2013-04-29 NOTE — Care Management Note (Addendum)
    Page 1 of 2   05/13/2013     3:01:28 PM   CARE MANAGEMENT NOTE 05/13/2013  Patient:  JASLINE, BUSKIRK   Account Number:  [id number]  Date Initiated:  04/29/2013  Documentation initiated by:  Elissa Hefty  Subjective/Objective Assessment:   adm w at fib  ACUTE RESP FAILURE WITH HYPOXIA     Action/Plan:   lives w husband, pcp dr Nicki Reaper nadel   Anticipated DC Date:  05/12/2013   Anticipated DC Plan:  [place] referral  Clinical Social Worker      DC Planning Services  CM consult      Choice offered to / List presented to:             Status of service:  Completed, signed off Medicare Important Message given?   (If response is "NO", the following Medicare IM given date fields will be blank) Date Medicare IM given:   Date Additional Medicare IM given:    Discharge Disposition:  [place]  Per UR Regulation:  Reviewed for med. necessity/level of care/duration of stay  If discussed at [place] of Stay Meetings, dates discussed:   05/04/2013  05/11/2013    Comments:  05/12/13 Orlandus Borowski,RN,BSN [phone] PT DISCHARGED TO SNF TODAY, PER CSW ARRANGEMENTS.  05/11/13 Tatum Corl,RN,BSN [phone] S/P ABLATION AND PPM PLACEMENT ON 05/10/13.  05/06/13 Tayton Decaire,RN,BSN [phone] CSW FOLLOWING FOR ST-SNF WHEN MEDICALLY STABLE FOR DC.  05/04/13 Francis MSN BSN CCM Tentative plan is transfer to SNF for rehab if pt requires more assistance than spouse can provide, otherwise home with home therapy.  Discussed with pt and spouse, provided list of home health agencies.  Spouse chose [facility] to provide services but currently feels he will not be able to provide the care pt needs unless she makes significant recovery during the next day or two.  Both state they would prefer a short SNF stay.

## 2013-04-30 ENCOUNTER — Inpatient Hospital Stay (HOSPITAL_COMMUNITY): Payer: Medicare Other

## 2013-04-30 LAB — GLUCOSE, CAPILLARY
GLUCOSE-CAPILLARY: 148 mg/dL — AB (ref 70–99)
GLUCOSE-CAPILLARY: 188 mg/dL — AB (ref 70–99)
GLUCOSE-CAPILLARY: 203 mg/dL — AB (ref 70–99)
Glucose-Capillary: 171 mg/dL — ABNORMAL HIGH (ref 70–99)
Glucose-Capillary: 182 mg/dL — ABNORMAL HIGH (ref 70–99)
Glucose-Capillary: 191 mg/dL — ABNORMAL HIGH (ref 70–99)

## 2013-04-30 LAB — CBC
HCT: 35 % — ABNORMAL LOW (ref 36.0–46.0)
Hemoglobin: 11.6 g/dL — ABNORMAL LOW (ref 12.0–15.0)
MCH: 32.2 pg (ref 26.0–34.0)
MCHC: 33.1 g/dL (ref 30.0–36.0)
MCV: 97.2 fL (ref 78.0–100.0)
PLATELETS: 175 10*3/uL (ref 150–400)
RBC: 3.6 MIL/uL — ABNORMAL LOW (ref 3.87–5.11)
RDW: 14.9 % (ref 11.5–15.5)
WBC: 7.4 10*3/uL (ref 4.0–10.5)

## 2013-04-30 LAB — BASIC METABOLIC PANEL
BUN: 56 mg/dL — ABNORMAL HIGH (ref 6–23)
CALCIUM: 8.5 mg/dL (ref 8.4–10.5)
CO2: 24 mEq/L (ref 19–32)
CREATININE: 1.65 mg/dL — AB (ref 0.50–1.10)
Chloride: 98 mEq/L (ref 96–112)
GFR calc Af Amer: 34 mL/min — ABNORMAL LOW (ref >90)
GFR, EST NON AFRICAN AMERICAN: 29 mL/min — AB (ref >90)
Glucose, Bld: 162 mg/dL — ABNORMAL HIGH (ref 70–99)
Potassium: 5.1 mEq/L (ref 3.7–5.3)
SODIUM: 137 meq/L (ref 137–147)

## 2013-04-30 LAB — MAGNESIUM: Magnesium: 1.8 mg/dL (ref 1.5–2.5)

## 2013-04-30 LAB — PHOSPHORUS: PHOSPHORUS: 4.9 mg/dL — AB (ref 2.3–4.6)

## 2013-04-30 MED ORDER — ACETYLCYSTEINE 20 % IN SOLN
4.0000 mL | Freq: Two times a day (BID) | RESPIRATORY_TRACT | Status: AC
  Administered 2013-04-30 – 2013-05-02 (×4): 4 mL via RESPIRATORY_TRACT
  Filled 2013-04-30 (×5): qty 4

## 2013-04-30 MED ORDER — FUROSEMIDE 10 MG/ML IJ SOLN
40.0000 mg | Freq: Three times a day (TID) | INTRAMUSCULAR | Status: DC
  Administered 2013-04-30 – 2013-05-01 (×3): 40 mg via INTRAVENOUS
  Filled 2013-04-30 (×6): qty 4

## 2013-04-30 NOTE — Progress Notes (Signed)
Pt. Refuses manual CPT stating that "it hurts too much & the flutter hurts too" Pt. Agreed to do flutter x 10 first rounds. At 00:00 rounds CPT was held due to respiratory distress requiring BIPAP.

## 2013-04-30 NOTE — Progress Notes (Signed)
PULMONARY / CRITICAL CARE MEDICINE   Name: [name] MRN: [id number] DOB: September 04, 1934    ADMISSION DATE:  04/28/2013  REFERRING MD :  Dr. Aline Brochure  PRIMARY SERVICE: PCCM   CHIEF COMPLAINT:  SOB, PNA  BRIEF PATIENT DESCRIPTION: 79 y/o F admitted 3/25 with Afib with RVR & concern for RLL CAP  SIGNIFICANT EVENTS / STUDIES:  3/25 - Admit with Afib with RVR & concern for RLL CAP 3/27- remains on /off NINV  LINES / TUBES: PIV  CULTURES: BCx2 3/25>>> Sputum 3/25>>> U. Strep 3/25>>>sent? U. Legionella 3/25>>.sent?  ANTIBIOTICS: Rocephin 3/25>>> Azithro 3/25>>>  SUBJECTIVE:  No sig change. Still w/ sig WOB  VITAL SIGNS: Temp:  [98 F (36.7 C)-98.3 F (36.8 C)] 98.2 F (36.8 C) (03/27 0748) Pulse Rate:  [91-129] 101 (03/27 0900) Resp:  [18-30] 20 (03/27 0900) BP: (100-141)/(52-71) 120/60 mmHg (03/27 0900) SpO2:  [91 %-97 %] 93 % (03/27 0900) FiO2 (%):  [50 %] 50 % (03/27 0428) Weight:  [91.6 kg (201 lb 15.1 oz)] 91.6 kg (201 lb 15.1 oz) (03/27 0200)  HEMODYNAMICS:    VENTILATOR SETTINGS: Vent Mode:  [-] BIPAP FiO2 (%):  [50 %] 50 % Set Rate:  [12 bmp] 12 bmp  INTAKE / OUTPUT: Intake/Output     03/26 0701 - 03/27 0700 03/27 0701 - 03/28 0700   P.O. 600    I.V. (mL/kg) 457.5 (5)    IV Piggyback 300    Total Intake(mL/kg) 1357.5 (14.8)    Urine (mL/kg/hr) 1150 (0.5)    Stool     Total Output 1150     Net +207.5          Urine Occurrence 3 x    Stool Occurrence 1 x      PHYSICAL EXAMINATION: General:  Centrally obese female, mild distress  Neuro:  AAOx4, speech clear, MAE HEENT:  Mm pink/mosit, short/thick neck Cardiovascular:  s1s2 irr irr, afib 130's on monitor  Lungs:  resp's even/non-labored, lungs bilaterally coarse rhonchi, crackles Abdomen:  Round/soft, bsx4 active Musculoskeletal:  No acute deformities Skin:  Warm/dry, 1-2+ pitting lower extremity edema  LABS:  CBC  Recent Labs Lab 04/28/13 1806 04/29/13 0321 04/30/13 0236  WBC 8.1  4.1 7.4  HGB 14.3 13.5 11.6*  HCT 42.7 41.4 35.0*  PLT 240 200 175   Coag's No results found for this basename: APTT, INR,  in the last 168 hours BMET  Recent Labs Lab 04/28/13 1806 04/29/13 0321 04/30/13 0236  NA 138 136* 137  K 4.5 5.3 5.1  CL 96 98 98  CO2 21 16* 24  BUN 38* 45* 56*  CREATININE 1.32* 2.02* 1.65*  GLUCOSE 191* 331* 162*   Electrolytes  Recent Labs Lab 04/28/13 1806 04/29/13 0321 04/30/13 0236  CALCIUM 9.6 8.7 8.5  MG  --   --  1.8  PHOS  --   --  4.9*   Sepsis Markers No results found for this basename: LATICACIDVEN, PROCALCITON, O2SATVEN,  in the last 168 hours ABG No results found for this basename: PHART, PCO2ART, PO2ART,  in the last 168 hours Liver Enzymes  Recent Labs Lab 04/28/13 1806  AST 22  ALT 31  ALKPHOS 28*  BILITOT 0.7  ALBUMIN 3.2*   Cardiac Enzymes  Recent Labs Lab 04/28/13 1806  TROPONINI <0.30  PROBNP 951.9*   Glucose  Recent Labs Lab 04/29/13 1221 04/29/13 1658 04/29/13 2035 04/29/13 2352 04/30/13 0408 04/30/13 0750  GLUCAP 186* 180* 158* 144* 148* 182*  Imaging Dg Chest Port 1 View  04/30/2013   CLINICAL DATA:  Respiratory failure.  EXAM: PORTABLE CHEST - 1 VIEW  COMPARISON:  DG CHEST 1V PORT dated 04/29/2013  FINDINGS: Cardiomegaly we bilateral pulmonary alveolar infiltrates consistent with congestive heart failure and pulmonary edema. Small right pleural effusion. No pneumothorax. No acute osseous abnormality.  IMPRESSION: Congestive heart failure with bilateral pulmonary edema and small right pleural effusion. Superimposed pneumonia cannot be excluded.   Electronically Signed   By: Marcello Moores  Register   On: 04/30/2013 08:10   Dg Chest Port 1 View  04/29/2013   CLINICAL DATA:  Short of breath  EXAM: PORTABLE CHEST - 1 VIEW  COMPARISON:  DG CHEST 1V PORT dated 04/28/2013  FINDINGS: Moderate cardiomegaly. Right basilar consolidation unchanged. Left lung is relatively clear. No pneumothorax.  IMPRESSION:  Stable right basilar consolidation.   Electronically Signed   By: Maryclare Bean M.D.   On: 04/29/2013 07:57   Dg Chest Port 1 View  04/28/2013   CLINICAL DATA:  Shortness of breath  EXAM: PORTABLE CHEST - 1 VIEW  COMPARISON:  01/15/2013  FINDINGS: Cardiac shadow is mildly enlarged. A small hiatal hernia is noted. The lungs are well aerated with evidence of right basilar infiltrate. This is new from the prior exam. No pneumothorax is seen. Postsurgical changes are noted in the right hilum.  IMPRESSION: Right basilar infiltrate.   Electronically Signed   By: Inez Catalina M.D.   On: 04/28/2013 18:16  aeration worse   ASSESSMENT / PLAN:  PULMONARY A:  Acute Respiratory failure in setting of RLL Infiltrate / CAP +/- element of edema Aeration worse on CXR 3/27, concern colllapse rt base COPD - nocturnal O2 dependent  Asthma Hx Lung Cancer - s/p bilateral upper lobectomies for BAC P:    - Scheduled xopenex / atrovent + PRN xopenex - Pulmicort - see Id section - Diuresis as tolerated  - Trend CXR in am after addition chest pt, mucomysts x 48 hr -scheduled NIMV , may need epap increased - rapid taper steroids, don't think she needs them  CARDIOVASCULAR A:  Atrial Fibrillation - w RVR.  Chronic, resistant to multiple medications pending ablation.  Troponin Neg. RVR likely due to stress of acute illness.  Pulmonary edema  HTN HLD P:  - Cardizem gtt for rate control. - Hold ARB given renal function. - lasix  -appreciate cards  RENAL A:   Acute Kidney Injury: scr improving  P:   - Hold nephrotoxic agents - KVO IVF. - Trend BMP w/ diuresis  - Replace electrolytes as indicated.  GASTROINTESTINAL A:   GERD Morbid Obesity P:   - diet as tolerated  - Continue home PPI & H2 blocker  HEMATOLOGIC A:   Chronic Anticoagulation  Mild anemia w/out evidence of bleeding P:  - Continue apixaban - Monitor for bleeding / CBC  INFECTIOUS A:   Concern RLL CAP Rt base atx P:   - Abx as  above - Cultures as above -see pulm -may need to add vanc and change to ceftaz if spike or rt base not open  ENDOCRINE A:   Diabetes    P:   - SSI - Hold metformin, amaryl - Lantus 10 units ordered.  NEUROLOGIC A:   Anxiety  Claustrophobia  P:   - Continue home xanax 0.25 TID PRN   Ccm time 30 min   Lavon Paganini. Titus Mould, MD, [place] Pgr: [place] [facility] & Critical [facility]

## 2013-04-30 NOTE — Progress Notes (Signed)
Patient Name: [name] Date of Encounter: 04/30/2013  Principal Problem:   Acute respiratory failure Active Problems:   LUNG CANCER   DIABETES MELLITUS   ASTHMA   COPD   A-fib with RVR after multiple albuterol treatments   Atrial fibrillation with RVR   CAP (community acquired pneumonia)   Length of Stay: 2  SUBJECTIVE  Has developed pleuritic pain in R chest base with coughing Thought she was doing better until she had a bad coughing spell after eating eggs  CURRENT MEDS . acidophilus  1 capsule Oral Daily  . antiseptic oral rinse  15 mL Mouth Rinse 6 times per day  . apixaban  5 mg Oral BID  . azithromycin  500 mg Intravenous Q24H  . budesonide  0.5 mg Nebulization BID  . cefTRIAXone (ROCEPHIN)  IV  1 g Intravenous Q24H  . dextromethorphan-guaiFENesin  1 tablet Oral BID  . famotidine  20 mg Oral BID  . fluticasone  1 spray Each Nare Daily  . insulin aspart  0-20 Units Subcutaneous 6 times per day  . insulin glargine  10 Units Subcutaneous QHS  . ipratropium  0.5 mg Nebulization Q6H  . levalbuterol  0.63 mg Nebulization Q6H  . methylPREDNISolone (SOLU-MEDROL) injection  40 mg Intravenous Q6H  . pantoprazole  40 mg Oral Daily    OBJECTIVE   Intake/Output Summary (Last 24 hours) at 04/30/13 0930 Last data filed at 04/30/13 0700  Gross per 24 hour  Intake 1302.5 ml  Output    800 ml  Net  502.5 ml   Filed Weights   04/28/13 2342 04/29/13 0427 04/30/13 0200  Weight: 92.4 kg (203 lb 11.3 oz) 93.1 kg (205 lb 4 oz) 91.6 kg (201 lb 15.1 oz)    PHYSICAL EXAM Filed Vitals:   04/30/13 0700 04/30/13 0748 04/30/13 0800 04/30/13 0900  BP: 112/58  111/58 120/60  Pulse: 91  108 101  Temp:  98.2 F (36.8 C)    TempSrc:  Oral    Resp: 19  22 20   Height:      Weight:      SpO2: 97%  95% 93%   General: Alert, oriented x3, mild respiratory distress  Head: no evidence of trauma, PERRL, EOMI, no exophtalmos or lid lag, no myxedema, no xanthelasma; normal ears,  nose and oropharynx  Neck: normal jugular venous pulsations and no hepatojugular reflux; brisk carotid pulses without delay and no carotid bruits  Chest: clear to auscultation, no signs of consolidation by percussion or palpation, normal fremitus, symmetrical and full respiratory excursions  Cardiovascular: normal position and quality of the apical impulse, irregular rhythm, normal first and second heart sounds, no rubs or gallops, no murmur  Abdomen: no tenderness or distention, no masses by palpation, no abnormal pulsatility or arterial bruits, normal bowel sounds, no hepatosplenomegaly  Extremities: no clubbing, cyanosis or edema; 2+ radial, ulnar and brachial pulses bilaterally; 2+ right femoral, posterior tibial and dorsalis pedis pulses; 2+ left femoral, posterior tibial and dorsalis pedis pulses; no subclavian or femoral bruits  Neurological: grossly nonfocal   LABS  CBC  Recent Labs  04/28/13 1806 04/29/13 0321 04/30/13 0236  WBC 8.1 4.1 7.4  NEUTROABS 6.6  --   --   HGB 14.3 13.5 11.6*  HCT 42.7 41.4 35.0*  MCV 97.9 99.5 97.2  PLT 240 200 299   Basic Metabolic Panel  Recent Labs  04/29/13 0321 04/30/13 0236  NA 136* 137  K 5.3 5.1  CL 98 98  CO2 16* 24  GLUCOSE 331* 162*  BUN 45* 56*  CREATININE 2.02* 1.65*  CALCIUM 8.7 8.5  MG  --  1.8  PHOS  --  4.9*   Liver Function Tests  Recent Labs  04/28/13 1806  AST 22  ALT 31  ALKPHOS 28*  BILITOT 0.7  PROT 6.1  ALBUMIN 3.2*   No results found for this basename: LIPASE, AMYLASE,  in the last 72 hours Cardiac Enzymes  Recent Labs  04/28/13 1806  TROPONINI <0.30    Radiology Studies Imaging results have been reviewed and Dg Chest Port 1 View  04/30/2013   CLINICAL DATA:  Respiratory failure.  EXAM: PORTABLE CHEST - 1 VIEW  COMPARISON:  DG CHEST 1V PORT dated 04/29/2013  FINDINGS: Cardiomegaly we bilateral pulmonary alveolar infiltrates consistent with congestive heart failure and pulmonary edema. Small  right pleural effusion. No pneumothorax. No acute osseous abnormality.  IMPRESSION: Congestive heart failure with bilateral pulmonary edema and small right pleural effusion. Superimposed pneumonia cannot be excluded.   Electronically Signed   By: Marcello Moores  Register   On: 04/30/2013 08:10   Dg Chest Port 1 View  04/29/2013   CLINICAL DATA:  Short of breath  EXAM: PORTABLE CHEST - 1 VIEW  COMPARISON:  DG CHEST 1V PORT dated 04/28/2013  FINDINGS: Moderate cardiomegaly. Right basilar consolidation unchanged. Left lung is relatively clear. No pneumothorax.  IMPRESSION: Stable right basilar consolidation.   Electronically Signed   By: Maryclare Bean M.D.   On: 04/29/2013 07:57   Dg Chest Port 1 View  04/28/2013   CLINICAL DATA:  Shortness of breath  EXAM: PORTABLE CHEST - 1 VIEW  COMPARISON:  01/15/2013  FINDINGS: Cardiac shadow is mildly enlarged. A small hiatal hernia is noted. The lungs are well aerated with evidence of right basilar infiltrate. This is new from the prior exam. No pneumothorax is seen. Postsurgical changes are noted in the right hilum.  IMPRESSION: Right basilar infiltrate.   Electronically Signed   By: Inez Catalina M.D.   On: 04/28/2013 18:16    TELE AF , rate around 80-100 bpm on iv diltiazem, increased to 130 with coughing spell   ASSESSMENT AND PLAN Acute exacerbation of chronic restrictive and obstructive lung disease due to RLL pneumonia AF with generally well controlled ventricular rate, fully anticoagulated After resolution of acute illness, make decision re: AV node ablation-pacemaker versus possible RF ablation at [facility].   Sanda Klein, MD, [facility] [phone] office [phone] pager 04/30/2013 9:30 AM

## 2013-05-01 DIAGNOSIS — E119 Type 2 diabetes mellitus without complications: Secondary | ICD-10-CM

## 2013-05-01 DIAGNOSIS — F411 Generalized anxiety disorder: Secondary | ICD-10-CM

## 2013-05-01 LAB — COMPREHENSIVE METABOLIC PANEL
ALT: 97 U/L — ABNORMAL HIGH (ref 0–35)
AST: 40 U/L — AB (ref 0–37)
Albumin: 2.2 g/dL — ABNORMAL LOW (ref 3.5–5.2)
Alkaline Phosphatase: 45 U/L (ref 39–117)
BUN: 63 mg/dL — ABNORMAL HIGH (ref 6–23)
CALCIUM: 8.5 mg/dL (ref 8.4–10.5)
CO2: 21 mEq/L (ref 19–32)
Chloride: 94 mEq/L — ABNORMAL LOW (ref 96–112)
Creatinine, Ser: 1.34 mg/dL — ABNORMAL HIGH (ref 0.50–1.10)
GFR calc Af Amer: 43 mL/min — ABNORMAL LOW (ref >90)
GFR calc non Af Amer: 37 mL/min — ABNORMAL LOW (ref >90)
Glucose, Bld: 187 mg/dL — ABNORMAL HIGH (ref 70–99)
Potassium: 5 mEq/L (ref 3.7–5.3)
Sodium: 134 mEq/L — ABNORMAL LOW (ref 137–147)
TOTAL PROTEIN: 6.4 g/dL (ref 6.0–8.3)
Total Bilirubin: 0.3 mg/dL (ref 0.3–1.2)

## 2013-05-01 LAB — GLUCOSE, CAPILLARY
GLUCOSE-CAPILLARY: 196 mg/dL — AB (ref 70–99)
Glucose-Capillary: 183 mg/dL — ABNORMAL HIGH (ref 70–99)
Glucose-Capillary: 219 mg/dL — ABNORMAL HIGH (ref 70–99)
Glucose-Capillary: 184 mg/dL — ABNORMAL HIGH (ref 70–99)
Glucose-Capillary: 228 mg/dL — ABNORMAL HIGH (ref 70–99)
Glucose-Capillary: 205 mg/dL — ABNORMAL HIGH (ref 70–99)

## 2013-05-01 LAB — CBC
HCT: 33.7 % — ABNORMAL LOW (ref 36.0–46.0)
Hemoglobin: 11.3 g/dL — ABNORMAL LOW (ref 12.0–15.0)
MCH: 32.4 pg (ref 26.0–34.0)
MCHC: 33.5 g/dL (ref 30.0–36.0)
MCV: 96.6 fL (ref 78.0–100.0)
PLATELETS: 181 10*3/uL (ref 150–400)
RBC: 3.49 MIL/uL — ABNORMAL LOW (ref 3.87–5.11)
RDW: 15 % (ref 11.5–15.5)
WBC: 9.1 10*3/uL (ref 4.0–10.5)

## 2013-05-01 LAB — OCCULT BLOOD X 1 CARD TO LAB, STOOL: Fecal Occult Bld: POSITIVE — AB

## 2013-05-01 LAB — PRO B NATRIURETIC PEPTIDE: Pro B Natriuretic peptide (BNP): 675.4 pg/mL — ABNORMAL HIGH (ref 0–450)

## 2013-05-01 MED ORDER — FUROSEMIDE 10 MG/ML IJ SOLN: 40.0000 mg | Freq: Three times a day (TID) | INTRAMUSCULAR | Status: DC

## 2013-05-01 MED ORDER — DILTIAZEM HCL 90 MG PO TABS
90.0000 mg | ORAL_TABLET | Freq: Four times a day (QID) | ORAL | Status: DC
  Administered 2013-05-01 – 2013-05-02 (×7): 90 mg via ORAL
  Filled 2013-05-01 (×11): qty 1

## 2013-05-01 MED ORDER — METHYLPREDNISOLONE SODIUM SUCC 40 MG IJ SOLR
40.0000 mg | Freq: Two times a day (BID) | INTRAMUSCULAR | Status: DC
  Administered 2013-05-01 – 2013-05-02 (×2): 40 mg via INTRAVENOUS
  Filled 2013-05-01 (×4): qty 1

## 2013-05-01 MED ORDER — FUROSEMIDE 10 MG/ML IJ SOLN
40.0000 mg | Freq: Four times a day (QID) | INTRAMUSCULAR | Status: AC
  Administered 2013-05-01 (×3): 40 mg via INTRAVENOUS

## 2013-05-01 MED ORDER — AZITHROMYCIN 500 MG PO TABS
500.0000 mg | ORAL_TABLET | Freq: Every day | ORAL | Status: DC
  Administered 2013-05-01 – 2013-05-03 (×3): 500 mg via ORAL
  Filled 2013-05-01 (×3): qty 1

## 2013-05-01 NOTE — Progress Notes (Signed)
Since midnight after having placed pt on Bipap, pt has continuously pulled off mask several times and after much coaxing, agreed to put it back in only to take it off again after a few minutes. With Bipap on, pt's sat 94 -97 but on [place] sat's 87 - 91 with coaching to concentrate on breathing. Reiterated the importance of using the Bipap. Bipap off at this time and RT notified. Will cont to monitor closely.

## 2013-05-01 NOTE — Progress Notes (Signed)
PULMONARY / CRITICAL CARE MEDICINE   Name: BERENICE OEHLERT MRN: [id number] DOB: April 20, 1934    ADMISSION DATE:  04/28/2013  REFERRING MD :  Dr. Aline Brochure  PRIMARY SERVICE: PCCM   CHIEF COMPLAINT:  SOB, PNA  BRIEF PATIENT DESCRIPTION: 79 y/o F admitted 3/25 with Afib with RVR & concern for RLL CAP  SIGNIFICANT EVENTS / STUDIES:  3/25 - Admit with Afib with RVR & concern for RLL CAP 3/27- remains on /off NINV  LINES / TUBES: PIV  CULTURES: BCx2 3/25>>> Sputum 3/25>>> U. Strep 3/25>>>sent? U. Legionella 3/25>>.sent?  ANTIBIOTICS: Rocephin 3/25>>> Azithro 3/25>>>  SUBJECTIVE:  No sig change. Still w/ sig WOB  VITAL SIGNS: Temp:  [97.3 F (36.3 C)-98.6 F (37 C)] 98.2 F (36.8 C) (03/28 0900) Pulse Rate:  [84-117] 105 (03/28 0900) Resp:  [17-36] 28 (03/28 0900) BP: (109-159)/(59-89) 149/78 mmHg (03/28 0900) SpO2:  [90 %-98 %] 92 % (03/28 0900) FiO2 (%):  [50 %] 50 % (03/28 0350) Weight:  [201 lb 1 oz (91.2 kg)] 201 lb 1 oz (91.2 kg) (03/28 0500)  HEMODYNAMICS:    VENTILATOR SETTINGS: Vent Mode:  [-] BIPAP FiO2 (%):  [50 %] 50 % Set Rate:  [12 bmp] 12 bmp  INTAKE / OUTPUT: Intake/Output     03/27 0701 - 03/28 0700 03/28 0701 - 03/29 0700   P.O. 620 240   I.V. (mL/kg) 584 (6.4) 50 (0.5)   IV Piggyback     Total Intake(mL/kg) 1204 (13.2) 290 (3.2)   Urine (mL/kg/hr) 750 (0.3)    Total Output 750     Net +454 +290        Urine Occurrence 5 x    Stool Occurrence 3 x      PHYSICAL EXAMINATION: General:  Centrally obese female, mild distress  Neuro:  AAOx4, speech clear, MAE HEENT:  Mm pink/mosit, short/thick neck Cardiovascular:  s1s2 irr irr, afib 130's on monitor  Lungs:  resp's even/non-labored, lungs bilaterally coarse rhonchi, crackles Abdomen:  Round/soft, bsx4 active Musculoskeletal:  No acute deformities Skin:  Warm/dry, 1-2+ pitting lower extremity edema  LABS:  CBC  Recent Labs Lab 04/29/13 0321 04/30/13 0236 05/01/13 0305  WBC 4.1  7.4 9.1  HGB 13.5 11.6* 11.3*  HCT 41.4 35.0* 33.7*  PLT 200 175 181   Coag's No results found for this basename: APTT, INR,  in the last 168 hours BMET  Recent Labs Lab 04/29/13 0321 04/30/13 0236 05/01/13 0305  NA 136* 137 134*  K 5.3 5.1 5.0  CL 98 98 94*  CO2 16* 24 21  BUN 45* 56* 63*  CREATININE 2.02* 1.65* 1.34*  GLUCOSE 331* 162* 187*   Electrolytes  Recent Labs Lab 04/29/13 0321 04/30/13 0236 05/01/13 0305  CALCIUM 8.7 8.5 8.5  MG  --  1.8  --   PHOS  --  4.9*  --    Sepsis Markers No results found for this basename: LATICACIDVEN, PROCALCITON, O2SATVEN,  in the last 168 hours ABG No results found for this basename: PHART, PCO2ART, PO2ART,  in the last 168 hours Liver Enzymes  Recent Labs Lab 04/28/13 1806 05/01/13 0305  AST 22 40*  ALT 31 97*  ALKPHOS 28* 45  BILITOT 0.7 0.3  ALBUMIN 3.2* 2.2*   Cardiac Enzymes  Recent Labs Lab 04/28/13 1806 05/01/13 0305  TROPONINI <0.30  --   PROBNP 951.9* 675.4*   Glucose  Recent Labs Lab 04/30/13 0750 04/30/13 1140 04/30/13 1643 04/30/13 2008 04/30/13 2344 05/01/13 0348  GLUCAP  182* 171* 188* 203* 191* 183*    Imaging Dg Chest Port 1 View  04/30/2013   CLINICAL DATA:  Respiratory failure.  EXAM: PORTABLE CHEST - 1 VIEW  COMPARISON:  DG CHEST 1V PORT dated 04/29/2013  FINDINGS: Cardiomegaly we bilateral pulmonary alveolar infiltrates consistent with congestive heart failure and pulmonary edema. Small right pleural effusion. No pneumothorax. No acute osseous abnormality.  IMPRESSION: Congestive heart failure with bilateral pulmonary edema and small right pleural effusion. Superimposed pneumonia cannot be excluded.   Electronically Signed   By: Marcello Moores  Register   On: 04/30/2013 08:10  aeration worse   ASSESSMENT / PLAN:  PULMONARY A:  Acute Respiratory failure in setting of RLL Infiltrate / CAP +/- element of edema Aeration worse on CXR 3/27, concern colllapse rt base COPD - nocturnal O2  dependent  Asthma Hx Lung Cancer - s/p bilateral upper lobectomies for BAC P:   - Scheduled xopenex / atrovent + PRN xopenex - Pulmicort - See Id section - Diuresis as ordered - Trend CXR in am - Scheduled NIMV - Rapid taper steroids, don't think she needs them  CARDIOVASCULAR A:  Atrial Fibrillation - w RVR.  Chronic, resistant to multiple medications pending ablation.  Troponin Neg. RVR likely due to stress of acute illness.  Pulmonary edema  HTN HLD P:  - Cardizem gtt for rate control. - Hold ARB given renal function. - Lasix 40 mg IV q6 x3 doses. - Appreciate cards input.  RENAL A:   Acute Kidney Injury: scr improving  P:   - Hold nephrotoxic agents. - KVO IVF. - Trend BMP w/ diuresis. - Replace electrolytes as indicated.  GASTROINTESTINAL A:   GERD Morbid Obesity P:   - Diet as tolerated  - Continue home PPI & H2 blocker  HEMATOLOGIC A:   Chronic Anticoagulation  Mild anemia w/out evidence of bleeding P:  - Continue apixaban - Monitor for bleeding / CBC  INFECTIOUS A:   Concern RLL CAP Rt base atx P:   - Abx as above - Cultures as above - See pulm  ENDOCRINE A:   Diabetes    P:   - SSI - Hold metformin, amaryl - Lantus 10 units ordered.  NEUROLOGIC A:   Anxiety  Claustrophobia  P:   - Continue home xanax 0.25 TID PRN  Ccm time 35 min   Rush Farmer, M.D. [facility] Pulmonary/Critical Care Medicine. Pager: [phone]. After hours pager: [phone].

## 2013-05-01 NOTE — Progress Notes (Signed)
Pt is off bipap at this time.  Pt wants to remain off bipap, but rt will continue to mionuitor for desaturation with sleep.

## 2013-05-01 NOTE — Progress Notes (Signed)
Subjective:  Anxious but doing better. Improved breathing. No CP.   Objective:  Vital Signs in the last 24 hours: Temp:  [97.3 F (36.3 C)-98.6 F (37 C)] 98.2 F (36.8 C) (03/28 0900) Pulse Rate:  [84-117] 105 (03/28 0900) Resp:  [17-36] 28 (03/28 0900) BP: (109-159)/(59-89) 149/78 mmHg (03/28 0900) SpO2:  [90 %-98 %] 92 % (03/28 0900) FiO2 (%):  [50 %] 50 % (03/28 0350) Weight:  [201 lb 1 oz (91.2 kg)] 201 lb 1 oz (91.2 kg) (03/28 0500)  Intake/Output from previous day: 03/27 0701 - 03/28 0700 In: 1204 [P.O.:620; I.V.:584] Out: 750 [Urine:750]   Physical Exam: General: Well developed, well nourished, in no acute distress. Head:  Normocephalic and atraumatic. Lungs: Mild wheeze with mild increased respiratory effort.  Heart: Irreg irreg.  No murmur, rubs or gallops.  Abdomen: soft, non-tender, positive bowel sounds. Obese Extremities: No clubbing or cyanosis. No edema. Neurologic: Alert and oriented x 3.    Lab Results:  Recent Labs  04/30/13 0236 05/01/13 0305  WBC 7.4 9.1  HGB 11.6* 11.3*  PLT 175 181    Recent Labs  04/30/13 0236 05/01/13 0305  NA 137 134*  K 5.1 5.0  CL 98 94*  CO2 24 21  GLUCOSE 162* 187*  BUN 56* 63*  CREATININE 1.65* 1.34*    Recent Labs  04/28/13 1806  TROPONINI <0.30   Hepatic Function Panel  Recent Labs  05/01/13 0305  PROT 6.4  ALBUMIN 2.2*  AST 40*  ALT 97*  ALKPHOS 45  BILITOT 0.3   Imaging: Dg Chest Port 1 View  04/30/2013   CLINICAL DATA:  Respiratory failure.  EXAM: PORTABLE CHEST - 1 VIEW  COMPARISON:  DG CHEST 1V PORT dated 04/29/2013  FINDINGS: Cardiomegaly we bilateral pulmonary alveolar infiltrates consistent with congestive heart failure and pulmonary edema. Small right pleural effusion. No pneumothorax. No acute osseous abnormality.  IMPRESSION: Congestive heart failure with bilateral pulmonary edema and small right pleural effusion. Superimposed pneumonia cannot be excluded.   Electronically  Signed   By: Marcello Moores  Register   On: 04/30/2013 08:10   Personally viewed.   Telemetry: AFIB 90-110 Personally viewed.   Cardiac Studies:  Echo - 55% 8/14  . acetylcysteine  4 mL Nebulization BID  . acidophilus  1 capsule Oral Daily  . antiseptic oral rinse  15 mL Mouth Rinse 6 times per day  . apixaban  5 mg Oral BID  . azithromycin  500 mg Intravenous Q24H  . budesonide  0.5 mg Nebulization BID  . cefTRIAXone (ROCEPHIN)  IV  1 g Intravenous Q24H  . dextromethorphan-guaiFENesin  1 tablet Oral BID  . famotidine  20 mg Oral BID  . fluticasone  1 spray Each Nare Daily  . furosemide  40 mg Intravenous Q6H  . insulin aspart  0-20 Units Subcutaneous 6 times per day  . insulin glargine  10 Units Subcutaneous QHS  . ipratropium  0.5 mg Nebulization Q6H  . levalbuterol  0.63 mg Nebulization Q6H  . methylPREDNISolone (SOLU-MEDROL) injection  40 mg Intravenous Q12H  . pantoprazole  40 mg Oral Daily   Diltiazem - 15mg /hr   Assessment/Plan:  Principal Problem:   Acute respiratory failure Active Problems:   LUNG CANCER   DIABETES MELLITUS   ASTHMA   COPD   A-fib with RVR after multiple albuterol treatments   Atrial fibrillation with RVR   CAP (community acquired pneumonia)  1. Afib  - no changes   - cont Dilt  will change to PO 90 Q6.  - cont anticoagulation, Eliquis  - Agree with Dr. Loletha Grayer - not good ablation candidate.   AFIB will continue to improve as lung process hopefully improves.    SKAINS, [place] 05/01/2013, 11:07 AM

## 2013-05-02 ENCOUNTER — Inpatient Hospital Stay (HOSPITAL_COMMUNITY): Payer: Medicare Other

## 2013-05-02 LAB — CBC
HEMATOCRIT: 34.3 % — AB (ref 36.0–46.0)
Hemoglobin: 11.8 g/dL — ABNORMAL LOW (ref 12.0–15.0)
MCH: 32.8 pg (ref 26.0–34.0)
MCHC: 34.4 g/dL (ref 30.0–36.0)
MCV: 95.3 fL (ref 78.0–100.0)
Platelets: 211 10*3/uL (ref 150–400)
RBC: 3.6 MIL/uL — ABNORMAL LOW (ref 3.87–5.11)
RDW: 14.7 % (ref 11.5–15.5)
WBC: 7.6 10*3/uL (ref 4.0–10.5)

## 2013-05-02 LAB — BASIC METABOLIC PANEL
BUN: 57 mg/dL — ABNORMAL HIGH (ref 6–23)
CALCIUM: 8.5 mg/dL (ref 8.4–10.5)
CO2: 27 mEq/L (ref 19–32)
Chloride: 96 mEq/L (ref 96–112)
Creatinine, Ser: 1.06 mg/dL (ref 0.50–1.10)
GFR calc non Af Amer: 50 mL/min — ABNORMAL LOW (ref >90)
GFR, EST AFRICAN AMERICAN: 58 mL/min — AB (ref >90)
Glucose, Bld: 163 mg/dL — ABNORMAL HIGH (ref 70–99)
POTASSIUM: 4.2 meq/L (ref 3.7–5.3)
Sodium: 139 mEq/L (ref 137–147)

## 2013-05-02 LAB — PHOSPHORUS: Phosphorus: 3.2 mg/dL (ref 2.3–4.6)

## 2013-05-02 LAB — GLUCOSE, CAPILLARY
GLUCOSE-CAPILLARY: 223 mg/dL — AB (ref 70–99)
GLUCOSE-CAPILLARY: 289 mg/dL — AB (ref 70–99)
Glucose-Capillary: 180 mg/dL — ABNORMAL HIGH (ref 70–99)
Glucose-Capillary: 178 mg/dL — ABNORMAL HIGH (ref 70–99)
Glucose-Capillary: 243 mg/dL — ABNORMAL HIGH (ref 70–99)

## 2013-05-02 LAB — MAGNESIUM: Magnesium: 2.1 mg/dL (ref 1.5–2.5)

## 2013-05-02 MED ORDER — METHYLPREDNISOLONE SODIUM SUCC 40 MG IJ SOLR
20.0000 mg | Freq: Two times a day (BID) | INTRAMUSCULAR | Status: DC
  Administered 2013-05-02 – 2013-05-04 (×4): 20 mg via INTRAVENOUS
  Filled 2013-05-02 (×6): qty 0.5

## 2013-05-02 MED ORDER — FUROSEMIDE 10 MG/ML IJ SOLN
40.0000 mg | Freq: Four times a day (QID) | INTRAMUSCULAR | Status: AC
  Administered 2013-05-02 (×3): 40 mg via INTRAVENOUS
  Filled 2013-05-02 (×3): qty 4

## 2013-05-02 NOTE — Progress Notes (Signed)
Pt resting comfortably at this time. Pt does not wish to be placed back on bipap unless needed. RT will continue to monitor breathing and SpO2 throughout the night.

## 2013-05-02 NOTE — Progress Notes (Signed)
Pt resting at this time.  RT feels pt needs sleep more than flutter but will continue to monitor.  If pt wakes, rt will continue flutter.

## 2013-05-02 NOTE — Progress Notes (Signed)
Pt does not want to be woken to perform flutter. Pt is able to perform flutter on her own. Pt encouraged to use if she wakes up during the night. RT will encourage to use post breathing txs.

## 2013-05-02 NOTE — Progress Notes (Addendum)
PULMONARY / CRITICAL CARE MEDICINE   Name: [name] MRN: [id number] DOB: [birth year]    ADMISSION DATE:  04/28/2013  REFERRING MD :  Dr. Aline Brochure  PRIMARY SERVICE: PCCM   CHIEF COMPLAINT:  SOB, PNA  BRIEF PATIENT DESCRIPTION: 79 y/o F admitted 3/25 with Afib with RVR & concern for RLL CAP  SIGNIFICANT EVENTS / STUDIES:  3/25 - Admit with Afib with RVR & concern for RLL CAP 3/27- remains on /off NINV  LINES / TUBES: PIV  CULTURES: BCx2 3/25>>> Sputum 3/25>>> U. Strep 3/25>>>sent? U. Legionella 3/25>>.sent?  ANTIBIOTICS: Rocephin 3/25>>> Azithro 3/25>>>  SUBJECTIVE:  No sig change. Still w/ sig WOB  VITAL SIGNS: Temp:  [97.6 F (36.4 C)-98.4 F (36.9 C)] 98.2 F (36.8 C) (03/29 0400) Pulse Rate:  [81-121] 121 (03/29 1000) Resp:  [16-33] 27 (03/29 1000) BP: (120-162)/(53-123) 149/73 mmHg (03/29 1000) SpO2:  [83 %-94 %] 90 % (03/29 1000) Weight:  [195 lb 8.8 oz (88.7 kg)] 195 lb 8.8 oz (88.7 kg) (03/29 0500)  HEMODYNAMICS:    VENTILATOR SETTINGS:    INTAKE / OUTPUT: Intake/Output     03/28 0701 - 03/29 0700 03/29 0701 - 03/30 0700   P.O. 480 500   I.V. (mL/kg) 178.8 (2)    IV Piggyback 100    Total Intake(mL/kg) 758.8 (8.6) 500 (5.6)   Urine (mL/kg/hr) 1600 (0.8)    Total Output 1600     Net -841.2 +500        Urine Occurrence  1 x    PHYSICAL EXAMINATION: General:  Centrally obese female, mild distress  Neuro:  AAOx4, speech clear, MAE HEENT:  Mm pink/mosit, short/thick neck Cardiovascular:  s1s2 irr irr, afib 130's on monitor  Lungs:  resp's even/non-labored, lungs bilaterally coarse rhonchi, crackles Abdomen:  Round/soft, bsx4 active Musculoskeletal:  No acute deformities Skin:  Warm/dry, 1-2+ pitting lower extremity edema  LABS:  CBC  Recent Labs Lab 04/30/13 0236 05/01/13 0305 05/02/13 0253  WBC 7.4 9.1 7.6  HGB 11.6* 11.3* 11.8*  HCT 35.0* 33.7* 34.3*  PLT 175 181 211   Coag's No results found for this basename: APTT, INR,   in the last 168 hours BMET  Recent Labs Lab 04/30/13 0236 05/01/13 0305 05/02/13 0253  NA 137 134* 139  K 5.1 5.0 4.2  CL 98 94* 96  CO2 24 21 27   BUN 56* 63* 57*  CREATININE 1.65* 1.34* 1.06  GLUCOSE 162* 187* 163*   Electrolytes  Recent Labs Lab 04/30/13 0236 05/01/13 0305 05/02/13 0253  CALCIUM 8.5 8.5 8.5  MG 1.8  --  2.1  PHOS 4.9*  --  3.2   Sepsis Markers No results found for this basename: LATICACIDVEN, PROCALCITON, O2SATVEN,  in the last 168 hours  ABG No results found for this basename: PHART, PCO2ART, PO2ART,  in the last 168 hours  Liver Enzymes  Recent Labs Lab 04/28/13 1806 05/01/13 0305  AST 22 40*  ALT 31 97*  ALKPHOS 28* 45  BILITOT 0.7 0.3  ALBUMIN 3.2* 2.2*   Cardiac Enzymes  Recent Labs Lab 04/28/13 1806 05/01/13 0305  TROPONINI <0.30  --   PROBNP 951.9* 675.4*   Glucose  Recent Labs Lab 05/01/13 1215 05/01/13 1648 05/01/13 2040 05/01/13 2311 05/02/13 0331 05/02/13 0806  GLUCAP 219* 184* 228* 205* 180* 178*   Imaging Dg Chest Port 1 View  05/02/2013   CLINICAL DATA:  Respiratory failure.  EXAM: PORTABLE CHEST - 1 VIEW  COMPARISON:  DG CHEST 1V PORT  dated 04/30/2013  FINDINGS: Patchy interstitial densities in the perihilar and lower lung regions. Findings suggest mild edema and there may be slight improvement. Heart size is upper limits of normal. Negative for a pneumothorax.  IMPRESSION: Mild improvement in the interstitial pulmonary edema.   Electronically Signed   By: Markus Daft M.D.   On: 05/02/2013 08:10  aeration worse   ASSESSMENT / PLAN:  PULMONARY A:  Acute Respiratory failure in setting of RLL Infiltrate / CAP +/- element of edema Aeration worse on CXR 3/27, concern colllapse rt base COPD - nocturnal O2 dependent  Asthma Hx Lung Cancer - s/p bilateral upper lobectomies for BAC P:   - Scheduled xopenex / atrovent + PRN xopenex - Pulmicort - See Id section - Diuresis as ordered - Trend CXR in am -  Scheduled NIMV - Decrease solumedrol to 20 q12, and change to prednisone in AM with a quick taper.  CARDIOVASCULAR A:  Atrial Fibrillation - w RVR.  Chronic, resistant to multiple medications pending ablation.  Troponin Neg. RVR likely due to stress of acute illness.  Pulmonary edema  HTN HLD P:  - Cardizem gtt for rate control. - Hold ARB given renal function. - Lasix 40 mg IV q6 x3 doses. - Appreciate cards input.  RENAL A:   Acute Kidney Injury: scr improving  P:   - Hold nephrotoxic agents. - KVO IVF. - Trend BMP w/ diuresis. - Replace electrolytes as indicated.  GASTROINTESTINAL A:   GERD Morbid Obesity P:   - Diet as tolerated  - Continue home PPI & H2 blocker  HEMATOLOGIC A:   Chronic Anticoagulation  Mild anemia w/out evidence of bleeding P:  - Continue apixaban - Monitor for bleeding / CBC  INFECTIOUS A:   Concern RLL CAP Rt base atx P:   - D/C zithromax after today's dose and continue rocephin for a total of 8 days, stop date 4/1. - Cultures as above. - See pulm.  ENDOCRINE A:   Diabetes    P:   - SSI - Hold metformin, amaryl - Lantus 10 units ordered.  NEUROLOGIC A:   Anxiety  Claustrophobia  P:   - Continue home xanax 0.25 TID PRN  Transfer to SDU, Dr. Jeannine Kitten patient, will discuss with him on Monday if he wishes to see patient in the hospital or give to [facility].  Rush Farmer, M.D. [facility] Pulmonary/Critical Care Medicine. Pager: [phone]. After hours pager: [phone].

## 2013-05-02 NOTE — Evaluation (Signed)
Physical Therapy Evaluation Patient Details Name: [name] MRN: [id number] DOB: 11-05-1934 Today's Date: 05/02/2013   History of Present Illness  Pt admitted with SOB and PNA. Pt with Afib and RVE & concern for RLL CAP.  Clinical Impression  Pt mobility severely limited by SOB and dec in SpO2 during activity. Pt with poor activity tolerance/decreased endurance. Pt currently requires RW for safe ambulation and assist for all transfers.  If pt does not progress well from mobility stand point and is able to achieve supervision level pt may need ST-SNF to maximize functional recovery for safe return home with spouse.    Follow Up Recommendations Supervision/Assistance - 24 hour;Home health PT    Equipment Recommendations  Rolling walker with 5" wheels (shower seat)    Recommendations for Other Services       Precautions / Restrictions Precautions Precautions: Fall Restrictions Weight Bearing Restrictions: No      Mobility  Bed Mobility Overal bed mobility:  (pt up in chair upon PT arrival)                Transfers Overall transfer level: Needs assistance Equipment used: Rolling walker (2 wheeled) Transfers: Sit to/from [facility] Sit to Stand: Min assist;+2 physical assistance Stand pivot transfers: +2 physical assistance;Min assist       General transfer comment: used rocking technique, max v/c's for hand placement  Ambulation/Gait Ambulation/Gait assistance: Min assist;+2 safety/equipment (2nd person for lines/chair follow) Ambulation Distance (Feet): 5 Feet (x2) Assistive device: Rolling walker (2 wheeled) Gait Pattern/deviations: Step-to pattern;Decreased stride length     General Gait Details: + SOB, Pt 81% on 4LO2 via [place], pt 87% on 6LO2 via [place]  Stairs            Wheelchair Mobility    Modified Rankin (Stroke Patients Only)       Balance Overall balance assessment: Needs assistance         Standing balance support:  During functional activity (performing hygiene s/p using BSC) Standing balance-Leahy Scale: Fair Standing balance comment: + SOB, min guard to steady pt                     Pertinent Vitals/Pain SpO2 at 81% on 4LO2 during amb, 87% on 6LO2 via [place] expects to be discharged to:: Private residence Living Arrangements: Spouse/significant other Available Help at Discharge: Family;Available 24 hours/day Type of Home: House Home Access: Stairs to enter Entrance Stairs-Rails: Right Entrance Stairs-Number of Steps: 4 Home Layout: Two level;Able to live on main level with bedroom/bathroom Home Equipment: Walker - 2 wheels (walk in shower)      Prior Function Level of Independence: Independent               Hand Dominance   Dominant Hand: Right    Extremity/Trunk Assessment   Upper Extremity Assessment: Generalized weakness           Lower Extremity Assessment: Generalized weakness      Cervical / Trunk Assessment: Normal  Communication   Communication: No difficulties  Cognition Arousal/Alertness: Awake/alert Behavior During Therapy: WFL for tasks assessed/performed Overall Cognitive Status: Within Functional Limits for tasks assessed                      General Comments General comments (skin integrity, edema, etc.): pt with request to use [facility]    Exercises        Assessment/Plan    PT  Assessment Patient needs continued PT services  PT Diagnosis Difficulty walking;Generalized weakness   PT Problem List Decreased strength;Decreased activity tolerance;Decreased balance;Decreased mobility;Decreased coordination  PT Treatment Interventions DME instruction;Gait training;Stair training;Functional mobility training;Therapeutic activities;Therapeutic exercise;Balance training   PT Goals (Current goals can be found in the Care Plan section) Acute Rehab PT Goals Patient Stated Goal: "run down the hill with julie  andrew" PT Goal Formulation: With patient Time For Goal Achievement: 05/09/13 Potential to Achieve Goals: Good    Frequency Min 3X/week   Barriers to discharge        End of Session Equipment Utilized During Treatment: Gait belt;Oxygen Activity Tolerance: Patient limited by fatigue Patient left: in chair;with call bell/phone within reach;with family/visitor present         Time: 2334-3568 PT Time Calculation (min): 27 min   Charges:   PT Evaluation $Initial PT Evaluation Tier I: 1 Procedure PT Treatments $Gait Training: 8-22 mins   PT G Codes:          Kingsley Callander 05/02/2013, 3:12 PM  Kittie Plater, PT, DPT Pager #: [phone] Office #: [phone]

## 2013-05-02 NOTE — Progress Notes (Signed)
Subjective:  Anxious but doing better. Improved breathing. No CP. Still with significant cough. Heart rate generally improved currently 100-110.  Objective:  Vital Signs in the last 24 hours: Temp:  [97.6 F (36.4 C)-98.4 F (36.9 C)] 98.2 F (36.8 C) (03/29 0400) Pulse Rate:  [81-119] 106 (03/29 0700) Resp:  [16-33] 20 (03/29 0700) BP: (120-162)/(53-123) 146/87 mmHg (03/29 0700) SpO2:  [83 %-94 %] 93 % (03/29 0700) Weight:  [195 lb 8.8 oz (88.7 kg)] 195 lb 8.8 oz (88.7 kg) (03/29 0500)  Intake/Output from previous day: 03/28 0701 - 03/29 0700 In: 758.8 [P.O.:480; I.V.:178.8; IV Piggyback:100] Out: 1600 [Urine:1600]   Physical Exam: General: Well developed, well nourished, in no acute distress. Head:  Normocephalic and atraumatic. Lungs: Mild wheeze with mild increased respiratory effort.  Heart: Irreg irreg.  tachycardia No murmur, rubs or gallops.  Abdomen: soft, non-tender, positive bowel sounds. Obese Extremities: No clubbing or cyanosis. No edema. Neurologic: Alert and oriented x 3.    Lab Results:  Recent Labs  05/01/13 0305 05/02/13 0253  WBC 9.1 7.6  HGB 11.3* 11.8*  PLT 181 211    Recent Labs  05/01/13 0305 05/02/13 0253  NA 134* 139  K 5.0 4.2  CL 94* 96  CO2 21 27  GLUCOSE 187* 163*  BUN 63* 57*  CREATININE 1.34* 1.06   No results found for this basename: TROPONINI, CK, MB,  in the last 72 hours Hepatic Function Panel  Recent Labs  05/01/13 0305  PROT 6.4  ALBUMIN 2.2*  AST 40*  ALT 97*  ALKPHOS 45  BILITOT 0.3   Imaging: Dg Chest Port 1 View  05/02/2013   CLINICAL DATA:  Respiratory failure.  EXAM: PORTABLE CHEST - 1 VIEW  COMPARISON:  DG CHEST 1V PORT dated 04/30/2013  FINDINGS: Patchy interstitial densities in the perihilar and lower lung regions. Findings suggest mild edema and there may be slight improvement. Heart size is upper limits of normal. Negative for a pneumothorax.  IMPRESSION: Mild improvement in the interstitial  pulmonary edema.   Electronically Signed   By: Markus Daft M.D.   On: 05/02/2013 08:10   Telemetry: AFIB 90-110 Personally viewed.   Cardiac Studies:  Echo - 55% 8/14  . acidophilus  1 capsule Oral Daily  . antiseptic oral rinse  15 mL Mouth Rinse 6 times per day  . apixaban  5 mg Oral BID  . azithromycin  500 mg Oral Daily  . budesonide  0.5 mg Nebulization BID  . cefTRIAXone (ROCEPHIN)  IV  1 g Intravenous Q24H  . dextromethorphan-guaiFENesin  1 tablet Oral BID  . diltiazem  90 mg Oral QID  . famotidine  20 mg Oral BID  . fluticasone  1 spray Each Nare Daily  . insulin aspart  0-20 Units Subcutaneous 6 times per day  . insulin glargine  10 Units Subcutaneous QHS  . ipratropium  0.5 mg Nebulization Q6H  . levalbuterol  0.63 mg Nebulization Q6H  . methylPREDNISolone (SOLU-MEDROL) injection  40 mg Intravenous Q12H  . pantoprazole  40 mg Oral Daily    Assessment/Plan:  Principal Problem:   Acute respiratory failure Active Problems:   LUNG CANCER   DIABETES MELLITUS   ASTHMA   COPD   A-fib with RVR after multiple albuterol treatments   Atrial fibrillation with RVR   CAP (community acquired pneumonia)  79 year old female admitted on 3/25 with atrial fibrillation RVR and right lower lobe community-acquired pneumonia with history of asthma, COPD, history  of lung cancer with bilateral upper lobectomies.  1. Afib  - no changes   - cont Dilt PO 90 Q6. In the next day or 2, would advocate for consolidation to 360 CD.  - cont anticoagulation, Eliquis  - Agree with Dr. Loletha Grayer - not good ablation candidate.   AFIB will continue to improve as lung process hopefully improves. Heart rate under reasonable control.  I would be comfortable with a transfer to telemetry from a cardiovascular standpoint. Defer to primary team from respiratory standpoint.  2. Asthma/COPD-still with cough. Wheeze. Per primary team.  3. Obesity-encourage weight loss.   SKAINS, [place] 05/02/2013, 8:36 AM

## 2013-05-03 ENCOUNTER — Inpatient Hospital Stay (HOSPITAL_COMMUNITY): Payer: Medicare Other

## 2013-05-03 LAB — BASIC METABOLIC PANEL
BUN: 47 mg/dL — AB (ref 6–23)
CHLORIDE: 99 meq/L (ref 96–112)
CO2: 28 meq/L (ref 19–32)
Calcium: 9 mg/dL (ref 8.4–10.5)
Creatinine, Ser: 0.77 mg/dL (ref 0.50–1.10)
GFR calc Af Amer: >90 mL/min (ref >90)
GFR calc non Af Amer: 80 mL/min — ABNORMAL LOW (ref >90)
Glucose, Bld: 182 mg/dL — ABNORMAL HIGH (ref 70–99)
POTASSIUM: 4.1 meq/L (ref 3.7–5.3)
Sodium: 144 mEq/L (ref 137–147)

## 2013-05-03 LAB — CBC
HEMATOCRIT: 36 % (ref 36.0–46.0)
Hemoglobin: 12.2 g/dL (ref 12.0–15.0)
MCH: 32.5 pg (ref 26.0–34.0)
MCHC: 33.9 g/dL (ref 30.0–36.0)
MCV: 96 fL (ref 78.0–100.0)
Platelets: 224 10*3/uL (ref 150–400)
RBC: 3.75 MIL/uL — AB (ref 3.87–5.11)
RDW: 14.9 % (ref 11.5–15.5)
WBC: 5.3 10*3/uL (ref 4.0–10.5)

## 2013-05-03 LAB — GLUCOSE, CAPILLARY
GLUCOSE-CAPILLARY: 185 mg/dL — AB (ref 70–99)
GLUCOSE-CAPILLARY: 207 mg/dL — AB (ref 70–99)
GLUCOSE-CAPILLARY: 213 mg/dL — AB (ref 70–99)
GLUCOSE-CAPILLARY: 227 mg/dL — AB (ref 70–99)
GLUCOSE-CAPILLARY: 248 mg/dL — AB (ref 70–99)
Glucose-Capillary: 178 mg/dL — ABNORMAL HIGH (ref 70–99)

## 2013-05-03 LAB — MAGNESIUM: MAGNESIUM: 2.1 mg/dL (ref 1.5–2.5)

## 2013-05-03 LAB — PHOSPHORUS: Phosphorus: 2.6 mg/dL (ref 2.3–4.6)

## 2013-05-03 MED ORDER — FUROSEMIDE 10 MG/ML IJ SOLN
40.0000 mg | Freq: Four times a day (QID) | INTRAMUSCULAR | Status: AC
  Administered 2013-05-03 – 2013-05-04 (×3): 40 mg via INTRAVENOUS
  Filled 2013-05-03 (×3): qty 4

## 2013-05-03 MED ORDER — DILTIAZEM HCL ER COATED BEADS 360 MG PO CP24
360.0000 mg | ORAL_CAPSULE | Freq: Every day | ORAL | Status: DC
  Administered 2013-05-03 – 2013-05-10 (×8): 360 mg via ORAL
  Filled 2013-05-03 (×9): qty 1

## 2013-05-03 MED ORDER — WHITE PETROLATUM GEL
Status: AC
  Filled 2013-05-03: qty 5

## 2013-05-03 NOTE — Progress Notes (Signed)
Clinical Social Work Department BRIEF PSYCHOSOCIAL ASSESSMENT 05/03/2013  Patient:  [name], [name]     Account Number:  [id number]     Admit date:  04/28/2013  Clinical Social Worker:  Freeman Caldron  Date/Time:  05/03/2013 03:35 PM  Referred by:  Physician  Date Referred:  05/03/2013 Referred for  SNF Placement   Other Referral:   Interview type:  Patient Other interview type:    PSYCHOSOCIAL DATA Living Status:  FAMILY Admitted from facility:   Level of care:   Primary support name:  [name] ([phone]) Primary support relationship to patient:  SPOUSE Degree of support available:   Good--pt lives with spouse.    CURRENT CONCERNS Current Concerns  Post-Acute Placement   Other Concerns:    SOCIAL WORK ASSESSMENT / PLAN Introduced self and explained my role in care plan. Pt understanding of recommendation for SNF for short-term rehab, stating she wishes she could go straight home from the hospital but she agrees with going to rehab short-term, as she fears her husband would not be able to physically help her around the home. Pt states she has had to sleep on the first floor of their home because she cannot go up stairs. Pt states her goal is to get a place of what she calls "normalcy"--being able to walk on the beach, go up stairs in her house, etc. Pt was at the beach a week ago when she started having difficulty breathing and she and her husband came back to [place] so she could come to the hospital. Pt states [facility] is her first choice for SNF, and [facility] is her second choice. I called [facility] after sending the referral, to verify they got clinicals and to inform them this facility is pt's first choice. Got admissions' voicemail with another number to call for discharge planning [phone] so I called this number and got another voicemail for admissions. Left a message requesting a call back to verify they got referral and confirm if they can offer a  bed. Pt states these are her top choices, as she has visited friends/family at [facility] in the past. I will provide bed offers after I hear back from facilities.   Assessment/plan status:  Psychosocial Support/Ongoing Assessment of Needs Other assessment/ plan:   Information/referral to community resources:   SNF    PATIENT'S/FAMILY'S RESPONSE TO PLAN OF CARE: Good--pt engaged in conversation with me. Explained her frustrations with being unable to breathe, and I provided support and active listening. Engaged pt in solution-focused brief therapy while addressing any concerns/questions pt had about placement. Will follow up with her once facilities respond to bed request.       Ky Barban, MSW, [place] Social Worker [phone]

## 2013-05-03 NOTE — Progress Notes (Addendum)
Pt overview:  [name] is a 45 yof with pmhx of COPD, prior lung CA with lung surgery x2, HTN, DM, HLD, PVD who presented on 3/25 with AF/RVR and RLL CAP   Subjective:  Today [name] reports still feeling some SOB and thinks there is some improvement.  She denies chest pain, palpitations, N/V, abd pain.  She reports leg pain bilaterally which she states is from her DM neuropathy and is at baseline.   Objective:  Vital Signs in the last 24 hours: Temp:  [97.5 F (36.4 C)-98.5 F (36.9 C)] 98.5 F (36.9 C) (03/30 0429) Pulse Rate:  [88-124] 95 (03/30 0429) Resp:  [17-29] 17 (03/30 0429) BP: (117-162)/(67-92) 121/70 mmHg (03/30 0429) SpO2:  [88 %-97 %] 95 % (03/30 0728) Weight:  [199 lb 8.3 oz (90.5 kg)] 199 lb 8.3 oz (90.5 kg) (03/30 0429) Filed Weights   05/01/13 0500 05/02/13 0500 05/03/13 0429  Weight: 201 lb 1 oz (91.2 kg) 195 lb 8.8 oz (88.7 kg) 199 lb 8.3 oz (90.5 kg)   Intake/Output from previous day: 03/29 0701 - 03/30 0700 In: 690 [P.O.:620; I.V.:20; IV Piggyback:50] Out: 1400 [Urine:1400]  Physical Exam: General appearance: alert, cooperative and in mild respiratory distress, speaking in full, clear sentences.   Neck: JVD not seen elevated but difficult to assess secondary to body habitus. Lungs: Diminished in upper lobes c/w bilateral lobectomy.  Diffuse, mild wheezes noted (espsecially upper airway) with minor crackles noted in RLL.   Heart: Fast rate, irregularly irregular rhythm.  S1, S2 distinct with no murmur noted.   Abdomen: Diffusely tender without guarding, no appreciable organomegaly.  BS active.   Extremities: Tender to palpation bilaterally (chronic).  No edema, erythema or excessive warmth noted.  No sign of DVT Pulses: Left Dorsalis Pedis present 2+ Right Dorsalis Pedis present 2+ Radial pulses 2+  Lab Results:  Recent Labs  05/02/13 0253 05/03/13 0308  WBC 7.6 5.3  HGB 11.8* 12.2  PLT 211 224    Recent Labs  05/02/13 0253  05/03/13 0308  NA 139 144  K 4.2 4.1  CL 96 99  CO2 27 28  GLUCOSE 163* 182*  BUN 57* 47*  CREATININE 1.06 0.77   Hepatic Function Panel  Recent Labs  05/01/13 0305  PROT 6.4  ALBUMIN 2.2*  AST 40*  ALT 97*  ALKPHOS 45  BILITOT 0.3    Imaging: Imaging results have been reviewed and Dg Chest Port 1 View 05/03/2013   CLINICAL DATA:  Respiratory failure.  EXAM: PORTABLE CHEST - 1 VIEW  COMPARISON:  Chest x-ray from yesterday  FINDINGS: Unchanged cardiomegaly. Lung opacities have become asymmetric, with ill-defined airspace disease noted on the right. Haziness of the lower chest may reflect pleural fluid. No evidence of pneumothorax.  IMPRESSION: 1. With improvement in pulmonary edema, pulmonary opacities have become asymmetric to the right, raising the possibility of pneumonia. 2. Small right pleural effusion.   Electronically Signed   By: Jorje Guild M.D.   On: 05/03/2013 06:10   Dg Chest Port 1 View 05/02/2013   CLINICAL DATA:  Respiratory failure.  EXAM: PORTABLE CHEST - 1 VIEW  COMPARISON:  DG CHEST 1V PORT dated 04/30/2013  FINDINGS: Patchy interstitial densities in the perihilar and lower lung regions. Findings suggest mild edema and there may be slight improvement. Heart size is upper limits of normal. Negative for a pneumothorax.  IMPRESSION: Mild improvement in the interstitial pulmonary edema.   Electronically Signed  By: Markus Daft M.D.   On: 05/02/2013 08:10   Scheduled Meds: . acidophilus  1 capsule Oral Daily  . antiseptic oral rinse  15 mL Mouth Rinse 6 times per day  . apixaban  5 mg Oral BID  . azithromycin  500 mg Oral Daily  . budesonide  0.5 mg Nebulization BID  . cefTRIAXone (ROCEPHIN)  IV  1 g Intravenous Q24H  . dextromethorphan-guaiFENesin  1 tablet Oral BID  . diltiazem  360 mg Oral Daily  . famotidine  20 mg Oral BID  . fluticasone  1 spray Each Nare Daily  . insulin aspart  0-20 Units Subcutaneous 6 times per day  . insulin glargine  10 Units  Subcutaneous QHS  . ipratropium  0.5 mg Nebulization Q6H  . levalbuterol  0.63 mg Nebulization Q6H  . methylPREDNISolone (SOLU-MEDROL) injection  20 mg Intravenous Q12H  . pantoprazole  40 mg Oral Daily  Continuous Infusions: . diltiazem (CARDIZEM) infusion Stopped (05/01/13 1414)   Assessment/Plan:  Principal Problem:   Acute respiratory failure Active Problems:   LUNG CANCER   DIABETES MELLITUS   ASTHMA   COPD   A-fib with RVR after multiple albuterol treatments   Atrial fibrillation with RVR   CAP (community acquired pneumonia)  1. AF/RVR - Continue Diltiazem for rate control.  Per previous cards notes possibly consolidate 90mg  Q6hrs to 360 CD - will do now since HR improved and no doses held. - Pt anticoagulated with Eliquis - Monitor rate control as respiratory status improves and have pt f/u with cardiologist at [facility] s/p resolution of CAP for AF ablation vs AV node ablation.    2. Acute respiratory failure/ CAP  with pmhx lung CA, Asthma, COPD - Tx per primary team  3. DM - Tx per primary team  4. Pulmonary Edema  - weight on admit 208 lbs  - Lasix per CCM  - Consider low-dose daily Lasix, weight is 199 today, using bed scales, up 5 lbs.  - EF 60% in May 2014  Otherwise, per CCM.   LOS: 5 days    Laurey Morale 05/03/2013, 8:53 AM  Seen and agree w/ changes made. Rosaria Ferries, PA-C 05/03/2013 9:54 AM Beeper [phone]  History and all data above reviewed.  Patient examined.  I agree with the findings as above.  She is breathing better but not at baseline.  HR is increased but she has yet to receive her Cardizem for today The patient exam reveals EFE:[id number]  ,  Lungs: Decreased breath sounds  ,  Abd: Positive bowel sounds, no rebound no guarding, Ext Mild edema  .  All available labs, radiology testing, previous records reviewed. Agree with documented assessment and plan. Atrial fib:  Rate is still increased.  However, we will not be able to have this completely  controlled until her breathing is improved.  Switched to Cardizem CD.  We will follow rate.  Continue current anticoagulation.   Jeneen Rinks Vera Furniss  11:20 AM  05/03/2013

## 2013-05-03 NOTE — Progress Notes (Signed)
PULMONARY / CRITICAL CARE MEDICINE   Name: [name] MRN: [id number] DOB: [birth year]    ADMISSION DATE:  04/28/2013  REFERRING MD :  Dr. Aline Brochure  PRIMARY SERVICE: PCCM   CHIEF COMPLAINT:  SOB, PNA  BRIEF PATIENT DESCRIPTION: 79 y/o F admitted 3/25 with Afib with RVR & concern for RLL CAP  SIGNIFICANT EVENTS / STUDIES:  3/25 - Admit with Afib with RVR & concern for RLL CAP 3/27- remains on /off NINV  LINES / TUBES: PIV  CULTURES: BCx2 3/25>>> Sputum 3/25>>> U. Strep 3/25>>>sent? U. Legionella 3/25>>.sent?  ANTIBIOTICS: Rocephin 3/25>>> Azithro 3/25>>>  SUBJECTIVE:  No sig change. Still w/ sig WOB  VITAL SIGNS: Temp:  [97.4 F (36.3 C)-98.5 F (36.9 C)] 97.4 F (36.3 C) (03/30 0918) Pulse Rate:  [88-130] 130 (03/30 0918) Resp:  [17-29] 27 (03/30 0918) BP: (117-150)/(67-92) 133/79 mmHg (03/30 0918) SpO2:  [88 %-97 %] 94 % (03/30 0918) Weight:  [199 lb 8.3 oz (90.5 kg)] 199 lb 8.3 oz (90.5 kg) (03/30 0429)  HEMODYNAMICS:    VENTILATOR SETTINGS:    INTAKE / OUTPUT: Intake/Output     03/29 0701 - 03/30 0700 03/30 0701 - 03/31 0700   P.O. 620 240   I.V. (mL/kg) 20 (0.2)    IV Piggyback 50    Total Intake(mL/kg) 690 (7.6) 240 (2.7)   Urine (mL/kg/hr) 1400 (0.6) 400 (0.8)   Total Output 1400 400   Net -710 -160        Urine Occurrence 4 x     PHYSICAL EXAMINATION: General:  Centrally obese female, mild distress  Neuro:  AAOx4, speech clear, MAE HEENT:  Mm pink/mosit, short/thick neck Cardiovascular:  s1s2 irr irr, afib 130's on monitor  Lungs:  resp's even/non-labored, lungs bilaterally coarse rhonchi, crackles Abdomen:  Round/soft, bsx4 active Musculoskeletal:  No acute deformities Skin:  Warm/dry, 1-2+ pitting lower extremity edema  LABS:  CBC  Recent Labs Lab 05/01/13 0305 05/02/13 0253 05/03/13 0308  WBC 9.1 7.6 5.3  HGB 11.3* 11.8* 12.2  HCT 33.7* 34.3* 36.0  PLT 181 211 224   Coag's No results found for this basename: APTT,  INR,  in the last 168 hours BMET  Recent Labs Lab 05/01/13 0305 05/02/13 0253 05/03/13 0308  NA 134* 139 144  K 5.0 4.2 4.1  CL 94* 96 99  CO2 21 27 28   BUN 63* 57* 47*  CREATININE 1.34* 1.06 0.77  GLUCOSE 187* 163* 182*   Electrolytes  Recent Labs Lab 04/30/13 0236 05/01/13 0305 05/02/13 0253 05/03/13 0308  CALCIUM 8.5 8.5 8.5 9.0  MG 1.8  --  2.1 2.1  PHOS 4.9*  --  3.2 2.6   Sepsis Markers No results found for this basename: LATICACIDVEN, PROCALCITON, O2SATVEN,  in the last 168 hours  ABG No results found for this basename: PHART, PCO2ART, PO2ART,  in the last 168 hours  Liver Enzymes  Recent Labs Lab 04/28/13 1806 05/01/13 0305  AST 22 40*  ALT 31 97*  ALKPHOS 28* 45  BILITOT 0.7 0.3  ALBUMIN 3.2* 2.2*   Cardiac Enzymes  Recent Labs Lab 04/28/13 1806 05/01/13 0305  TROPONINI <0.30  --   PROBNP 951.9* 675.4*   Glucose  Recent Labs Lab 05/02/13 1217 05/02/13 1641 05/02/13 2102 05/03/13 0028 05/03/13 0430 05/03/13 0805  GLUCAP 243* 223* 289* 213* 178* 185*   Imaging Dg Chest Port 1 View  05/03/2013   CLINICAL DATA:  Respiratory failure.  EXAM: PORTABLE CHEST - 1 VIEW  COMPARISON:  Chest x-ray from yesterday  FINDINGS: Unchanged cardiomegaly. Lung opacities have become asymmetric, with ill-defined airspace disease noted on the right. Haziness of the lower chest may reflect pleural fluid. No evidence of pneumothorax.  IMPRESSION: 1. With improvement in pulmonary edema, pulmonary opacities have become asymmetric to the right, raising the possibility of pneumonia. 2. Small right pleural effusion.   Electronically Signed   By: Jorje Guild M.D.   On: 05/03/2013 06:10   Dg Chest Port 1 View  05/02/2013   CLINICAL DATA:  Respiratory failure.  EXAM: PORTABLE CHEST - 1 VIEW  COMPARISON:  DG CHEST 1V PORT dated 04/30/2013  FINDINGS: Patchy interstitial densities in the perihilar and lower lung regions. Findings suggest mild edema and there may be  slight improvement. Heart size is upper limits of normal. Negative for a pneumothorax.  IMPRESSION: Mild improvement in the interstitial pulmonary edema.   Electronically Signed   By: Markus Daft M.D.   On: 05/02/2013 08:10  aeration worse   ASSESSMENT / PLAN:  PULMONARY A:  Acute Respiratory failure in setting of RLL Infiltrate / CAP +/- element of edema Aeration worse on CXR 3/27, concern colllapse rt base COPD - nocturnal O2 dependent  Asthma Hx Lung Cancer - s/p bilateral upper lobectomies for BAC P:   - Scheduled xopenex / atrovent + PRN xopenex - Pulmicort - See Id section - Diuresis as ordered - Trend CXR in am - Scheduled NIMV - Decrease solumedrol to 20 q12, and change to prednisone in AM with a quick taper.  CARDIOVASCULAR A:  Atrial Fibrillation - w RVR.  Chronic, resistant to multiple medications pending ablation.  Troponin Neg. RVR likely due to stress of acute illness.  Pulmonary edema  HTN HLD P:  - Cardizem gtt for rate control. - Hold ARB given renal function. - Lasix 40 mg IV q6 x3 doses. - Appreciate cards input.  RENAL A:   Acute Kidney Injury: scr improving  P:   - Hold nephrotoxic agents. - KVO IVF. - Trend BMP w/ diuresis. - Replace electrolytes as indicated.  GASTROINTESTINAL A:   GERD Morbid Obesity P:   - Diet as tolerated  - Continue home PPI & H2 blocker  HEMATOLOGIC A:   Chronic Anticoagulation  Mild anemia w/out evidence of bleeding P:  - Continue apixaban - Monitor for bleeding / CBC  INFECTIOUS A:   Concern RLL CAP Rt base atx P:   - D/Ced zithromax after 3/29 dose and continue rocephin for a total of 8 days, stop date 4/1. - Cultures as above. - See pulm.  ENDOCRINE A:   Diabetes    P:   - SSI - Hold metformin, amaryl - Lantus 10 units ordered.  NEUROLOGIC A:   Anxiety  Claustrophobia  P:   - Continue home xanax 0.25 TID PRN  Transfer to [facility] service, PCCM will sign off, please call back if needed.  Dr.  Jeannine Kitten is the patient's PCP, please arrange for f/u upon discharge.  Rush Farmer, M.D. [facility] Pulmonary/Critical Care Medicine. Pager: [phone]. After hours pager: [phone].

## 2013-05-03 NOTE — Progress Notes (Signed)
Pt does not wish to be placed on Bipap tonight. Pt tolerating [place] well at this time. No distress noted.

## 2013-05-03 NOTE — Progress Notes (Addendum)
Clinical Social Work Department CLINICAL SOCIAL WORK PLACEMENT NOTE 05/03/2013  Patient:  [name], [name]  Account Number:  [id number] Admit date:  04/28/2013  Clinical Social Worker:  Ky Barban, Latanya Presser  Date/time:  05/03/2013 03:46 PM  Clinical Social Work is seeking post-discharge placement for this patient at the following level of care:   [place]   (*CSW will update this form in Epic as items are completed)   05/03/2013  Patient/family provided with [place] Department of Clinical Social Work's list of facilities offering this level of care within the geographic area requested by the patient (or if unable, by the patient's family).  05/03/2013  Patient/family informed of their freedom to choose among providers that offer the needed level of care, that participate in Medicare, Medicaid or managed care program needed by the patient, have an available bed and are willing to accept the patient.  05/03/2013  Patient/family informed of [facility]' ownership interest in [facility], as well as of the fact that they are under no obligation to receive care at this facility.  PASARR submitted to EDS on 05/03/2013 PASARR number received from EDS on 05/03/2013  FL2 transmitted to all facilities in geographic area requested by pt/family on  05/03/2013 FL2 transmitted to all facilities within larger geographic area on   Patient informed that his/her managed care company has contracts with or will negotiate with  certain facilities, including the following:     Patient/family informed of bed offers received:  05/04/13 Patient chooses bed at [facility] Physician recommends and patient chooses bed at    Patient to be transferred to [facility] on  05/12/2013 ([facility]) Patient to be transferred to facility by  [facility] ([facility])  The following physician request were entered in Epic:   Additional Comments: Pt and husband in room when I went to deliver bed offers.  Explained to pt that [facility] is unable to offer a bed, and pt states this is fine because she changed her mind about placement. Pt has some friends at [facility], and believes this might be her first choice for placement. [facility] has offered a bed, and husband asked me to check into co-pay and find out how much/if pt would have one. I called admissions and left a voicemail requesting she have her billing office run insurance and let me know if there is a co-pay/how much it is so I can tell the family. Waiting on response from [facility], as family is interested in this if it is an option--if not, they would like pt go to [facility].  Addendum: Informed pt [facility] denied bed request, stating "unable to meet needs." Pt/husband disappointed, as pt's friend recommended [facility] over [facility]. Husband looking through list of options for placement, and I explained the best way to choose is, as they have been, speaking with family/friends who have been to facilities before and visiting the facilities in person. I explained that when I hear back from [facility] concerning the insurance piece I will let them know, as this will be consistent across the board and there might be a slight variation in price based on each facility's rate, but we will be better able to estimate the cost after [facility] calls me back.  Ky Barban, MSW, [facility] Clinical Social Worker [phone]

## 2013-05-04 ENCOUNTER — Inpatient Hospital Stay (HOSPITAL_COMMUNITY): Payer: Medicare Other

## 2013-05-04 LAB — CBC
HEMATOCRIT: 40.5 % (ref 36.0–46.0)
Hemoglobin: 13.5 g/dL (ref 12.0–15.0)
MCH: 32.3 pg (ref 26.0–34.0)
MCHC: 33.3 g/dL (ref 30.0–36.0)
MCV: 96.9 fL (ref 78.0–100.0)
PLATELETS: 294 10*3/uL (ref 150–400)
RBC: 4.18 MIL/uL (ref 3.87–5.11)
RDW: 14.7 % (ref 11.5–15.5)
WBC: 7.2 10*3/uL (ref 4.0–10.5)

## 2013-05-04 LAB — BASIC METABOLIC PANEL
BUN: 43 mg/dL — AB (ref 6–23)
CHLORIDE: 96 meq/L (ref 96–112)
CO2: 33 mEq/L — ABNORMAL HIGH (ref 19–32)
Calcium: 9.5 mg/dL (ref 8.4–10.5)
Creatinine, Ser: 0.7 mg/dL (ref 0.50–1.10)
GFR calc non Af Amer: 82 mL/min — ABNORMAL LOW (ref >90)
Glucose, Bld: 186 mg/dL — ABNORMAL HIGH (ref 70–99)
Potassium: 3.7 mEq/L (ref 3.7–5.3)
Sodium: 145 mEq/L (ref 137–147)

## 2013-05-04 LAB — GLUCOSE, CAPILLARY
GLUCOSE-CAPILLARY: 226 mg/dL — AB (ref 70–99)
Glucose-Capillary: 171 mg/dL — ABNORMAL HIGH (ref 70–99)
Glucose-Capillary: 173 mg/dL — ABNORMAL HIGH (ref 70–99)
Glucose-Capillary: 226 mg/dL — ABNORMAL HIGH (ref 70–99)
Glucose-Capillary: 175 mg/dL — ABNORMAL HIGH (ref 70–99)
Glucose-Capillary: 176 mg/dL — ABNORMAL HIGH (ref 70–99)

## 2013-05-04 LAB — TSH: TSH: 0.092 u[IU]/mL — ABNORMAL LOW (ref 0.350–4.500)

## 2013-05-04 LAB — PHOSPHORUS: PHOSPHORUS: 2.5 mg/dL (ref 2.3–4.6)

## 2013-05-04 LAB — MAGNESIUM: MAGNESIUM: 2.1 mg/dL (ref 1.5–2.5)

## 2013-05-04 MED ORDER — INSULIN ASPART 100 UNIT/ML ~~LOC~~ SOLN
0.0000 [IU] | Freq: Three times a day (TID) | SUBCUTANEOUS | Status: DC
  Administered 2013-05-04: 12:00:00 via SUBCUTANEOUS
  Administered 2013-05-04: 4 [IU] via SUBCUTANEOUS
  Administered 2013-05-05: 3 [IU] via SUBCUTANEOUS
  Administered 2013-05-06: 4 [IU] via SUBCUTANEOUS
  Administered 2013-05-07 (×2): 3 [IU] via SUBCUTANEOUS
  Administered 2013-05-07 – 2013-05-08 (×2): 4 [IU] via SUBCUTANEOUS
  Administered 2013-05-08: 3 [IU] via SUBCUTANEOUS
  Administered 2013-05-08: 4 [IU] via SUBCUTANEOUS
  Administered 2013-05-09: 3 [IU] via SUBCUTANEOUS
  Administered 2013-05-09 – 2013-05-11 (×3): 4 [IU] via SUBCUTANEOUS
  Administered 2013-05-11: 3 [IU] via SUBCUTANEOUS
  Administered 2013-05-11: 4 [IU] via SUBCUTANEOUS
  Administered 2013-05-12: 3 [IU] via SUBCUTANEOUS

## 2013-05-04 MED ORDER — GLIMEPIRIDE 2 MG PO TABS
2.0000 mg | ORAL_TABLET | Freq: Two times a day (BID) | ORAL | Status: DC
  Administered 2013-05-04 – 2013-05-05 (×4): 2 mg via ORAL
  Filled 2013-05-04 (×8): qty 1

## 2013-05-04 MED ORDER — INSULIN GLARGINE 100 UNIT/ML ~~LOC~~ SOLN
15.0000 [IU] | Freq: Two times a day (BID) | SUBCUTANEOUS | Status: DC
  Administered 2013-05-04 – 2013-05-05 (×3): 15 [IU] via SUBCUTANEOUS
  Filled 2013-05-04 (×5): qty 0.15

## 2013-05-04 NOTE — Progress Notes (Signed)
Physical Therapy Treatment Patient Details Name: [name] MRN: [id number] DOB: [birth year] Today's Date: 05/04/2013    History of Present Illness Pt admitted with SOB and PNA    PT Comments    Progressing well.  On 4L [place], but now maintaing sats in the mid to upper 90's.  HR very labile and in afib. Rate not very dependent on activity.  Ranged from 110's to high 150's.  Follow Up Recommendations  Home health PT;Supervision/Assistance - 24 hour     Equipment Recommendations  Rolling walker with 5" wheels    Recommendations for Other Services       Precautions / Restrictions Precautions Precautions: Fall    Mobility  Bed Mobility                  Transfers Overall transfer level: Needs assistance Equipment used: Rolling walker (2 wheeled) Transfers: Sit to/from Stand Sit to Stand: Min assist;Mod assist (pt from progressively from 70-80% over  3 stands)         General transfer comment: Needed assist to come forward more than lifting; vc's for hand placement and technique  Ambulation/Gait Ambulation/Gait assistance: Min assist Ambulation Distance (Feet): 5 Feet (forward and back x3 with RW) Assistive device: Rolling walker (2 wheeled) Gait Pattern/deviations: Step-through pattern;Decreased step length - right;Decreased step length - left     General Gait Details: generally steady gait and should be ready for progressing walking into the hall   Stairs            Wheelchair Mobility    Modified Rankin (Stroke Patients Only)       Balance Overall balance assessment: Needs assistance Sitting-balance support: Feet supported Sitting balance-Leahy Scale: Fair     Standing balance support: Bilateral upper extremity supported Standing balance-Leahy Scale:  (likely fair)                      Cognition Arousal/Alertness: Awake/alert Behavior During Therapy: WFL for tasks assessed/performed Overall Cognitive Status: Within Functional  Limits for tasks assessed                      Exercises General Exercises - Lower Extremity Ankle Circles/Pumps: AROM;15 reps;Seated Long Arc Quad: AROM;Both;10 reps;Seated Hip Flexion/Marching: AROM;Both;10 reps;Seated Toe Raises: AROM;Both;15 reps;Seated Heel Raises: AROM;Both;15 reps;Seated    General Comments        Pertinent Vitals/Pain See above.    Home Living                      Prior Function            PT Goals (current goals can now be found in the care plan section) Acute Rehab PT Goals PT Goal Formulation: With patient Time For Goal Achievement: 05/09/13 Potential to Achieve Goals: Good Progress towards PT goals: Progressing toward goals    Frequency  Min 3X/week    PT Plan Current plan remains appropriate    End of Session Equipment Utilized During Treatment: Oxygen Activity Tolerance: Patient tolerated treatment well Patient left: in chair;with call bell/phone within reach;with family/visitor present     Time: 1201-1227 PT Time Calculation (min): 26 min  Charges:  $Gait Training: 8-22 mins $Therapeutic Exercise: 8-22 mins                    G Codes:      [name], [name] 05/04/2013, 4:19 PM 05/04/2013  [name], [place] [phone]  (  pager)

## 2013-05-04 NOTE — Progress Notes (Signed)
Pt's first choice for placement is [facility]. I called this facility, and they state they are full and don't anticipate any openings for a few weeks. I asked if I could make a referral anyway and add pt to a waiting list, but admissions reiterated that this would be futile. Pt has a few bed offers, but [facility] (second choice) responded "considering"--will present bed offers pt has currently and will update her once [facility] responds to request.   Ky Barban, MSW, [place] Social Worker [phone]

## 2013-05-04 NOTE — Progress Notes (Signed)
SUBJECTIVE:  Still not breathing better.   PHYSICAL EXAM Filed Vitals:   05/04/13 0500 05/04/13 0828 05/04/13 0950 05/04/13 1100  BP:  177/102  169/88  Pulse:  145  162  Temp:  97.6 F (36.4 C)  98 F (36.7 C)  TempSrc:  Oral  Oral  Resp:  26  17  Height:      Weight: 198 lb 13.7 oz (90.2 kg)     SpO2:  94% 93% 94%   General:  No distress but frail.   Lungs:  Decreased breath sounds Heart:  Irregular Abdomen:  Positive bowel sounds, no rebound no guarding Extremities:  No edema  LABS:  Results for orders placed during the hospital encounter of 04/28/13 (from the past 24 hour(s))  GLUCOSE, CAPILLARY     Status: Abnormal   Collection Time    05/03/13  3:47 PM      Result Value Ref Range   Glucose-Capillary 207 (*) 70 - 99 mg/dL  GLUCOSE, CAPILLARY     Status: Abnormal   Collection Time    05/03/13  7:40 PM      Result Value Ref Range   Glucose-Capillary 248 (*) 70 - 99 mg/dL  GLUCOSE, CAPILLARY     Status: Abnormal   Collection Time    05/03/13 11:20 PM      Result Value Ref Range   Glucose-Capillary 226 (*) 70 - 99 mg/dL  CBC     Status: None   Collection Time    05/04/13  3:05 AM      Result Value Ref Range   WBC 7.2  4.0 - 10.5 K/uL   RBC 4.18  3.87 - 5.11 MIL/uL   Hemoglobin 13.5  12.0 - 15.0 g/dL   HCT 40.5  36.0 - 46.0 %   MCV 96.9  78.0 - 100.0 fL   MCH 32.3  26.0 - 34.0 pg   MCHC 33.3  30.0 - 36.0 g/dL   RDW 14.7  11.5 - 15.5 %   Platelets 294  150 - 400 K/uL  BASIC METABOLIC PANEL     Status: Abnormal   Collection Time    05/04/13  3:05 AM      Result Value Ref Range   Sodium 145  137 - 147 mEq/L   Potassium 3.7  3.7 - 5.3 mEq/L   Chloride 96  96 - 112 mEq/L   CO2 33 (*) 19 - 32 mEq/L   Glucose, Bld 186 (*) 70 - 99 mg/dL   BUN 43 (*) 6 - 23 mg/dL   Creatinine, Ser 0.70  0.50 - 1.10 mg/dL   Calcium 9.5  8.4 - 10.5 mg/dL   GFR calc non Af Amer 82 (*) >90 mL/min   GFR calc Af Amer >90  >90 mL/min  MAGNESIUM     Status: None   Collection  Time    05/04/13  3:05 AM      Result Value Ref Range   Magnesium 2.1  1.5 - 2.5 mg/dL  PHOSPHORUS     Status: None   Collection Time    05/04/13  3:05 AM      Result Value Ref Range   Phosphorus 2.5  2.3 - 4.6 mg/dL  GLUCOSE, CAPILLARY     Status: Abnormal   Collection Time    05/04/13  3:50 AM      Result Value Ref Range   Glucose-Capillary 171 (*) 70 - 99 mg/dL  GLUCOSE, CAPILLARY     Status: Abnormal  Collection Time    05/04/13  8:33 AM      Result Value Ref Range   Glucose-Capillary 173 (*) 70 - 99 mg/dL  GLUCOSE, CAPILLARY     Status: Abnormal   Collection Time    05/04/13 12:08 PM      Result Value Ref Range   Glucose-Capillary 226 (*) 70 - 99 mg/dL    Intake/Output Summary (Last 24 hours) at 05/04/13 1403 Last data filed at 05/04/13 1127  Gross per 24 hour  Intake    290 ml  Output   1601 ml  Net  -1311 ml    ASSESSMENT AND PLAN:  Atrial fib:    HR still elevated but improved on 360 Cardizem.  I do not suspect that we will be able to control the rate further and I see no indication for IV Cardizem/amio at this point.   Continue Eliquis.  Note TSH is pending    Minus Breeding 05/04/2013 2:03 PM

## 2013-05-04 NOTE — Progress Notes (Signed)
Pt's husband expressed hesitation taking pt home--pt wants to go to [facility], as she has friends/family that have had good experience at this facility. I called [facility] admissions to inform them pt would like their facility and to inquire about if/how much co-pay pt would have to pay. Did not hear back from facility, so will follow up tomorrow. Estimate discharge in 2-3 days, per [facility] who discussed this with MD.   Ky Barban, MSW, [facility] Clinical Social Worker [phone]

## 2013-05-04 NOTE — Progress Notes (Signed)
[facility]  [name] ZTI:[id number] DOB: 04/20/1934 DOA: 04/28/2013 PCP: Noralee Space, MD  Time spent : 35 mins  Brief narrative: 79 y/o admitted 3/25 with Afib with RVR & concern for RLL CAP. Presented with SOB and hypoxia with hypotension. Her VR was >150 sustained. She was admitted to the ICU.  Cardiology was consulted and felt her sx's were exacerbated by underlying acute respiratory failure. She has had persistent RVR. She did have a degree of minor CHF at presentation but quickly developed ARF so diuresis was stopped. Recently transitioned to PO Cardizem. Was on Eliquis prior to admission. She has improved clinically on empiric anbx's for CAP.  HPI/Subjective: Still SOB - no apparent awareness for tachypalpitations.  Assessment/Plan:  Acute respiratory failure with hypoxia:   A) baseline COPD   B) CAP (community acquired pneumonia)   C) Possible DHF exacerbation -no wheezing -may be redeveloping CHF due to RVR so consider pulse dosing of Lasix -cont tx for CAP including supportive care: 7 days of therapy -cont Xopenex nebs given ongoing RVR -Predisone taper completed -cont Pulmicort nebs  A-fib with RVR  -per Cards -cont CCB -FU with cardiologist at [facility] after dc -rates still up at rest (as high as 140s) -review of OP CVTS notes reveal pt previously on Amiodaron but asked to be taken off this medicine - no reason given  Nontoxic multinodular goiter -previous OP evaluations by Drs Chalmers Cater and Harlow Asa -last neck US was in 2011 -last TSH 01/15/13 was low at 0.277 so will repeat  DIABETES MELLITUS -poorly controlled since admit -increase Lantus (using instead of home Glucophage) -resume home Amaryl  HYPERTENSION/Grade 1 chronic diastolic dysfunction -moderate control -only on CCB for rate control -avoid ACE I given renal function -consider nitrate or hydralazine for better afterload reduction  LUNG CANCER  -History of  stage IA (T1, N0, 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 rate 17, height 5' (1.524 m), weight 198 lb 13.7 oz (90.2 kg), SpO2 94.00%.  Intake/Output Summary (Last 24 hours) at 05/04/13 1315 Last data filed at 05/04/13 1127  Gross per 24 hour  Intake    290 ml  Output   1601 ml  Net  -1311 ml   Exam: General: No acute respiratory distress noted at rest Lungs: Clear to auscultation bilaterally without wheezes or crackles,  3 L Cardiovascular: Irregular persistently tachycardic rate and rhythm without murmur gallop or rub normal S1 and S2, trace peripheral edema or JVD Abdomen: Nontender, nondistended, soft, bowel sounds positive, no rebound, no ascites, no appreciable mass Musculoskeletal: No significant cyanosis, clubbing of bilateral lower extremities Neurological: Alert and oriented x 3 but at times seems slightly confused, moves all extremities x 4 without focal neurological deficits, CN 2-12 intact  Scheduled Meds:  Scheduled Meds: . acidophilus  1 capsule Oral Daily  . antiseptic oral rinse  15 mL Mouth Rinse 6 times per day  . apixaban  5 mg Oral BID  . budesonide  0.5  mg Nebulization BID  . cefTRIAXone (ROCEPHIN)  IV  1 g Intravenous Q24H  . dextromethorphan-guaiFENesin  1 tablet Oral BID  . diltiazem  360 mg Oral Daily  . famotidine  20 mg Oral BID  . fluticasone  1 spray Each  Nare Daily  . glimepiride  2 mg Oral BID WC  . insulin aspart  0-20 Units Subcutaneous TID WC  . insulin glargine  15 Units Subcutaneous BID  . ipratropium  0.5 mg Nebulization Q6H  . levalbuterol  0.63 mg Nebulization Q6H  . pantoprazole  40 mg Oral Daily   Data Reviewed: Basic Metabolic Panel:  Recent Labs Lab 04/30/13 0236 05/01/13 0305 05/02/13 0253 05/03/13 0308 05/04/13 0305  NA 137 134* 139 144 145  K 5.1 5.0 4.2 4.1 3.7  CL 98 94* 96 99 96  CO2 24 21 27 28  33*  GLUCOSE 162* 187* 163* 182* 186*  BUN 56* 63* 57* 47* 43*  CREATININE 1.65* 1.34* 1.06 0.77 0.70  CALCIUM 8.5 8.5 8.5 9.0 9.5  MG 1.8  --  2.1 2.1 2.1  PHOS 4.9*  --  3.2 2.6 2.5   Liver Function Tests:  Recent Labs Lab 04/28/13 1806 05/01/13 0305  AST 22 40*  ALT 31 97*  ALKPHOS 28* 45  BILITOT 0.7 0.3  PROT 6.1 6.4  ALBUMIN 3.2* 2.2*   CBC:  Recent Labs Lab 04/28/13 1806  04/30/13 0236 05/01/13 0305 05/02/13 0253 05/03/13 0308 05/04/13 0305  WBC 8.1  < > 7.4 9.1 7.6 5.3 7.2  NEUTROABS 6.6  --   --   --   --   --   --   HGB 14.3  < > 11.6* 11.3* 11.8* 12.2 13.5  HCT 42.7  < > 35.0* 33.7* 34.3* 36.0 40.5  MCV 97.9  < > 97.2 96.6 95.3 96.0 96.9  PLT 240  < > 175 181 211 224 294  < > = values in this interval not displayed.  Cardiac Enzymes:  Recent Labs Lab 04/28/13 1806  TROPONINI <0.30   BNP (last 3 results)  Recent Labs  01/15/13 1750 04/28/13 1806 05/01/13 0305  PROBNP 253.6 951.9* 675.4*   CBG:  Recent Labs Lab 05/03/13 1940 05/03/13 2320 05/04/13 0350 05/04/13 0833 05/04/13 1208  GLUCAP 248* 226* 171* 173* 226*    Recent Results (from the past 240 hour(s))  MRSA PCR SCREENING     Status: None   Collection Time    04/28/13 11:54 PM      Result Value Ref Range Status   MRSA by PCR NEGATIVE  NEGATIVE Final   Comment:            The GeneXpert MRSA Assay (FDA     approved for NASAL specimens     only), is one component of a     comprehensive MRSA  colonization     surveillance program. It is not     intended to diagnose MRSA     infection nor to guide or     monitor treatment for     MRSA infections.  CULTURE, BLOOD (ROUTINE X 2)     Status: None   Collection Time    04/29/13  1:33 AM      Result Value Ref Range Status   Specimen Description BLOOD RIGHT ARM   Final   Special Requests BOTTLES DRAWN AEROBIC ONLY 8CC   Final   Culture  Setup Time     Final   Value: 04/29/2013 04:42  Performed at [facility]     Final   Value:        BLOOD CULTURE RECEIVED NO GROWTH TO DATE CULTURE WILL BE HELD FOR 5 DAYS BEFORE ISSUING A FINAL NEGATIVE REPORT     Performed at [facility]   Report Status PENDING   Incomplete  CULTURE, BLOOD (ROUTINE X 2)     Status: None   Collection Time    04/29/13  1:45 AM      Result Value Ref Range Status   Specimen Description BLOOD RIGHT WRIST   Final   Special Requests BOTTLES DRAWN AEROBIC ONLY 8CC   Final   Culture  Setup Time     Final   Value: 04/29/2013 04:42     Performed at [facility]   Culture     Final   Value:        BLOOD CULTURE RECEIVED NO GROWTH TO DATE CULTURE WILL BE HELD FOR 5 DAYS BEFORE ISSUING A FINAL NEGATIVE REPORT     Performed at [facility]   Report Status PENDING   Incomplete     Studies:  Recent x-ray studies have been reviewed in detail by the Attending Physician       Erin Hearing, [place] [facility] Office  [phone] Pager [phone]  **If unable to reach the above provider after paging please contact the [place] @ [phone]  On-Call/Text Page:      [email or web address]      password [email or web address]  If 7PM-7AM, please contact night-coverage [email or web address] Password [email or web address] 05/04/2013, 1:15 PM   LOS: 6 days   I have personally examined this patient and reviewed the entire database. I have reviewed the above note, made any necessary editorial changes, and agree with its content.  Cherene Altes, MD [facility]

## 2013-05-05 LAB — BASIC METABOLIC PANEL
BUN: 41 mg/dL — AB (ref 6–23)
CHLORIDE: 97 meq/L (ref 96–112)
CO2: 31 meq/L (ref 19–32)
Calcium: 9.9 mg/dL (ref 8.4–10.5)
Creatinine, Ser: 0.68 mg/dL (ref 0.50–1.10)
GFR calc Af Amer: >90 mL/min (ref >90)
GFR calc non Af Amer: 83 mL/min — ABNORMAL LOW (ref >90)
GLUCOSE: 97 mg/dL (ref 70–99)
POTASSIUM: 3.9 meq/L (ref 3.7–5.3)
Sodium: 141 mEq/L (ref 137–147)

## 2013-05-05 LAB — CULTURE, BLOOD (ROUTINE X 2)
Culture: NO GROWTH
Culture: NO GROWTH

## 2013-05-05 LAB — GLUCOSE, CAPILLARY
GLUCOSE-CAPILLARY: 129 mg/dL — AB (ref 70–99)
GLUCOSE-CAPILLARY: 69 mg/dL — AB (ref 70–99)
GLUCOSE-CAPILLARY: 128 mg/dL — AB (ref 70–99)
Glucose-Capillary: 62 mg/dL — ABNORMAL LOW (ref 70–99)
Glucose-Capillary: 124 mg/dL — ABNORMAL HIGH (ref 70–99)
Glucose-Capillary: 102 mg/dL — ABNORMAL HIGH (ref 70–99)

## 2013-05-05 LAB — T3: T3, Total: 49.8 ng/dl — ABNORMAL LOW (ref 80.0–204.0)

## 2013-05-05 MED ORDER — METOPROLOL TARTRATE 1 MG/ML IV SOLN: 5.0000 mg | Freq: Four times a day (QID) | INTRAVENOUS | Status: DC | PRN

## 2013-05-05 MED ORDER — IPRATROPIUM BROMIDE 0.02 % IN SOLN
0.5000 mg | Freq: Two times a day (BID) | RESPIRATORY_TRACT | Status: DC
  Administered 2013-05-05 – 2013-05-08 (×6): 0.5 mg via RESPIRATORY_TRACT
  Filled 2013-05-05 (×6): qty 2.5

## 2013-05-05 MED ORDER — METOPROLOL TARTRATE 1 MG/ML IV SOLN
5.0000 mg | Freq: Four times a day (QID) | INTRAVENOUS | Status: DC
  Administered 2013-05-05 (×2): 5 mg via INTRAVENOUS
  Filled 2013-05-05 (×2): qty 5

## 2013-05-05 MED ORDER — LEVALBUTEROL HCL 0.63 MG/3ML IN NEBU
0.6300 mg | INHALATION_SOLUTION | Freq: Two times a day (BID) | RESPIRATORY_TRACT | Status: DC
  Administered 2013-05-05 – 2013-05-06 (×2): 0.63 mg via RESPIRATORY_TRACT
  Filled 2013-05-05 (×4): qty 3

## 2013-05-05 MED ORDER — INSULIN GLARGINE 100 UNIT/ML ~~LOC~~ SOLN
10.0000 [IU] | Freq: Two times a day (BID) | SUBCUTANEOUS | Status: DC
  Administered 2013-05-05: 10 [IU] via SUBCUTANEOUS
  Filled 2013-05-05 (×3): qty 0.1

## 2013-05-05 MED ORDER — ACYCLOVIR 5 % EX CREA
TOPICAL_CREAM | CUTANEOUS | Status: DC
  Administered 2013-05-05 – 2013-05-08 (×23): via TOPICAL
  Administered 2013-05-08: 1 via TOPICAL
  Administered 2013-05-08 – 2013-05-09 (×2): via TOPICAL
  Administered 2013-05-09: 1 via TOPICAL
  Administered 2013-05-09 – 2013-05-10 (×7): via TOPICAL
  Administered 2013-05-10: 1 via TOPICAL
  Administered 2013-05-10: 15:00:00 via TOPICAL
  Administered 2013-05-10 (×3): 1 via TOPICAL
  Administered 2013-05-10: 07:00:00 via TOPICAL
  Administered 2013-05-11: 1 via TOPICAL
  Administered 2013-05-11 – 2013-05-12 (×9): via TOPICAL
  Filled 2013-05-05: qty 5

## 2013-05-05 MED ORDER — METOPROLOL TARTRATE 25 MG PO TABS
25.0000 mg | ORAL_TABLET | Freq: Two times a day (BID) | ORAL | Status: DC
  Administered 2013-05-05 – 2013-05-07 (×4): 25 mg via ORAL
  Filled 2013-05-05 (×5): qty 1

## 2013-05-05 NOTE — Progress Notes (Addendum)
Physical Therapy Treatment Patient Details Name: [name] MRN: [id number] DOB: 05-12-1934 Today's Date: 05/05/2013    History of Present Illness Pt admitted with SOB and PNA    PT Comments    Pt admitted with above. Pt currently with functional limitations due to balance and endurance deficits.  Pt will benefit from skilled PT to increase their independence and safety with mobility to allow discharge to the venue listed below.   Follow Up Recommendations  Supervision/Assistance - 24 hour;SNF     Equipment Recommendations  Rolling walker with 5" wheels    Recommendations for Other Services       Precautions / Restrictions Precautions Precautions: Fall Precaution Comments: watch HR Restrictions Weight Bearing Restrictions: No    Mobility  Bed Mobility Overal bed mobility: Needs Assistance Bed Mobility: Supine to Sit     Supine to sit: Min assist;HOB elevated (use of rail)        Transfers Overall transfer level: Needs assistance Equipment used: Rolling walker (2 wheeled) Transfers: Sit to/from Stand Sit to Stand: Min assist Stand pivot transfers: Min assist       General transfer comment: Needed assist to come forward more than lifting; vc's for hand placement and technique  Ambulation/Gait Ambulation/Gait assistance: Min assist Ambulation Distance (Feet): 20 Feet Assistive device: Rolling walker (2 wheeled) Gait Pattern/deviations: Step-through pattern;Decreased step length - right;Decreased step length - left;Trunk flexed   Gait velocity interpretation: Below normal speed for age/gender General Gait Details: generally steady gait.  Limited distance due to incr HR.     Stairs            Wheelchair Mobility    Modified Rankin (Stroke Patients Only)       Balance Overall balance assessment: Needs assistance Sitting-balance support: Feet supported Sitting balance-Leahy Scale: Good     Standing balance support: Bilateral upper extremity  supported;During functional activity Standing balance-Leahy Scale: Fair Standing balance comment: SOB with min guard to steady pt with pt need for use of RW                     Cognition Arousal/Alertness: Awake/alert Behavior During Therapy: WFL for tasks assessed/performed Overall Cognitive Status: Within Functional Limits for tasks assessed                      Exercises General Exercises - Lower Extremity Ankle Circles/Pumps: AROM;15 reps;Seated Long Arc Quad: AROM;Both;5 reps;Seated    General Comments        Pertinent Vitals/Pain HR 120's to 161 bpm with activity. Nursing aware.   Sats >90% on O2 with activity.  No pain.     Home Living Family/patient expects to be discharged to:: Skilled nursing facility                    Prior Function Level of Independence: Needs assistance    ADL's / Homemaking Assistance Needed: husband did housekeeping, also has paid help for housecleaning     PT Goals (current goals can now be found in the care plan section) Acute Rehab PT Goals Patient Stated Goal: not have to use 02 during the day Progress towards PT goals: Progressing toward goals    Frequency  Min 3X/week    PT Plan Current plan remains appropriate    End of Session Equipment Utilized During Treatment: Gait belt;Oxygen Activity Tolerance: Patient tolerated treatment well Patient left: in chair;with call bell/phone within reach;with family/visitor present     Time:  8099-8338 PT Time Calculation (min): 16 min  Charges:  $Gait Training: 8-22 mins                    G Codes:      [name],[name] Jun 03, 2013, 12:28 PM [name] [name] Acute Rehabilitation [phone] (pager)

## 2013-05-05 NOTE — Progress Notes (Signed)
[place] informing facility pt has selected their SNF, asking them to run her insurance with their billing office so I can tell family if there is a co-pay and how much this would be.    Ky Barban, MSW, [facility] Clinical Social Worker [phone]

## 2013-05-05 NOTE — Progress Notes (Signed)
Called by nursing HR increasing to 150's -160's. PRN lopressor added.

## 2013-05-05 NOTE — Progress Notes (Signed)
[facility] / [facility] Progress Note  BRENDALIZ KUK SPQ:[id number] DOB: [birth year] DOA: 04/28/2013 PCP: Noralee Space, MD  Time spent : 35 mins  Brief narrative: 79 y/o admitted 3/25 with Afib with RVR & concern for RLL CAP. Presented with SOB and hypoxia with hypotension. Her VR was >150 sustained. She was admitted to the ICU.  Cardiology was consulted and felt her sx's were exacerbated by underlying acute respiratory failure. She has had persistent RVR. She did have a degree of minor CHF at presentation but quickly developed ARF so diuresis was stopped. Recently transitioned to PO Cardizem. Was on Eliquis prior to admission. She has improved clinically on empiric anbx's for CAP.  HPI/Subjective: Complains of "herpes" lesions around mouth - says is quite painful.  Denies cp or sob.  Feels "better" today.   Assessment/Plan:  Acute respiratory failure with hypoxia:   A) baseline COPD   B) CAP (community acquired pneumonia)   C) Acute exacerbation of chronic diastolic CHF  -no wheezing -completed 7 days of empiric abx therapy for possible CAP -cont Xopenex nebs given ongoing RVR -Predisone taper completed -cont Pulmicort nebs  A-fib with RVR  -per Cards -cont CCB -FU with cardiologist at [facility] after dc -rates still up at rest (as high as 140s) -review of OP CVTS notes reveal pt previously on Amiodarone but asked to be taken off this medicine (? Due to thyroid issues) - no reason given -added IV Lopressor today  Nontoxic multinodular goiter -previous OP evaluations by Drs Chalmers Cater and Harlow Asa -last neck US was in 2011 -last TSH 01/15/13 was low at 0.277 - repeat this admit 0.092, T3 low at 49 so unless T4 elevated this is sick euthyroid syndrome -check neck US  DIABETES MELLITUS -better controlled -increased Lantus on 3/31 (using instead of home Glucophage) -resumed home Amaryl  HYPERTENSION/Grade 1 chronic diastolic dysfunction -moderate control -only on CCB for  rate control -avoid ACE I given renal function -follow w/ low dose BB added   Circumoral herpes simplex -Acyclovir 5% cream  LUNG CANCER  -History of stage IA (T1, N0, 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 rate 21, height 5' (1.524 m), weight 199 lb 8.3 oz (90.5 kg), SpO2 94.00%.  Intake/Output Summary (Last 24 hours) at 05/05/13 1422 Last data filed at 05/05/13 1300  Gross per 24 hour  Intake    290 ml  Output    750 ml  Net   -460 ml   Exam: General: No acute respiratory distress noted at rest ENT; multiple herpetic type lesions circumoral Lungs: Clear to auscultation bilaterally without wheezes or crackles,  4 L Cardiovascular: Irregular persistently tachycardic rate and rhythm without murmur gallop or rub normal S1 and S2, trace peripheral edema or JVD Abdomen: Nontender, nondistended, soft, bowel sounds positive, no rebound, no ascites, no appreciable mass Musculoskeletal: No significant cyanosis, clubbing of bilateral lower extremities Neurological: Alert and oriented x 3 but at times  seems slightly confused, moves all extremities x 4 without focal neurological deficits, CN 2-12 intact  Scheduled Meds:  Scheduled Meds: . acidophilus  1 capsule Oral Daily  . antiseptic oral rinse  15 mL Mouth Rinse 6 times per day  .  apixaban  5 mg Oral BID  . budesonide  0.5 mg Nebulization BID  . dextromethorphan-guaiFENesin  1 tablet Oral BID  . diltiazem  360 mg Oral Daily  . famotidine  20 mg Oral BID  . fluticasone  1 spray Each Nare Daily  . glimepiride  2 mg Oral BID WC  . insulin aspart  0-20 Units Subcutaneous TID WC  . insulin glargine  15 Units Subcutaneous BID  . ipratropium  0.5 mg Nebulization Q6H  . levalbuterol  0.63 mg Nebulization Q6H  . metoprolol  5 mg Intravenous 4 times per day  . pantoprazole  40 mg Oral Daily   Data Reviewed: Basic Metabolic Panel:  Recent Labs Lab 04/30/13 0236 05/01/13 0305 05/02/13 0253 05/03/13 0308 05/04/13 0305 05/05/13 0233  NA 137 134* 139 144 145 141  K 5.1 5.0 4.2 4.1 3.7 3.9  CL 98 94* 96 99 96 97  CO2 24 21 27 28  33* 31  GLUCOSE 162* 187* 163* 182* 186* 97  BUN 56* 63* 57* 47* 43* 41*  CREATININE 1.65* 1.34* 1.06 0.77 0.70 0.68  CALCIUM 8.5 8.5 8.5 9.0 9.5 9.9  MG 1.8  --  2.1 2.1 2.1  --   PHOS 4.9*  --  3.2 2.6 2.5  --    Liver Function Tests:  Recent Labs Lab 04/28/13 1806 05/01/13 0305  AST 22 40*  ALT 31 97*  ALKPHOS 28* 45  BILITOT 0.7 0.3  PROT 6.1 6.4  ALBUMIN 3.2* 2.2*   CBC:  Recent Labs Lab 04/28/13 1806  04/30/13 0236 05/01/13 0305 05/02/13 0253 05/03/13 0308 05/04/13 0305  WBC 8.1  < > 7.4 9.1 7.6 5.3 7.2  NEUTROABS 6.6  --   --   --   --   --   --   HGB 14.3  < > 11.6* 11.3* 11.8* 12.2 13.5  HCT 42.7  < > 35.0* 33.7* 34.3* 36.0 40.5  MCV 97.9  < > 97.2 96.6 95.3 96.0 96.9  PLT 240  < > 175 181 211 224 294  < > = values in this interval not displayed.  Cardiac Enzymes:  Recent Labs Lab 04/28/13 1806  TROPONINI <0.30   BNP (last 3 results)  Recent Labs  01/15/13 1750 04/28/13 1806 05/01/13 0305  PROBNP 253.6 951.9* 675.4*   CBG:  Recent Labs Lab 05/04/13 1631 05/04/13 2030 05/04/13 2232 05/05/13 0746 05/05/13 1206  GLUCAP 175* 176* 129* 69* 128*    Recent Results (from the past 240  hour(s))  MRSA PCR SCREENING     Status: None   Collection Time    04/28/13 11:54 PM      Result Value Ref Range Status   MRSA by PCR NEGATIVE  NEGATIVE Final   Comment:            The GeneXpert MRSA Assay (FDA     approved for NASAL specimens     only), is one component of a     comprehensive MRSA colonization     surveillance program. It is not     intended to diagnose MRSA     infection nor to guide or     monitor treatment for     MRSA infections.  CULTURE, BLOOD (ROUTINE X 2)     Status: None   Collection Time    04/29/13  1:33 AM      Result Value Ref Range Status   Specimen Description BLOOD RIGHT ARM   Final   Special Requests BOTTLES DRAWN AEROBIC ONLY 8CC   Final   Culture  Setup Time     Final   Value: 04/29/2013 04:42     Performed at [facility]   Culture     Final   Value: NO GROWTH 5 DAYS     Performed at [facility]   Report Status 05/05/2013 FINAL   Final  CULTURE, BLOOD (ROUTINE X 2)     Status: None   Collection Time    04/29/13  1:45 AM      Result Value Ref Range Status   Specimen Description BLOOD RIGHT WRIST   Final   Special Requests BOTTLES DRAWN AEROBIC ONLY 8CC   Final   Culture  Setup Time     Final   Value: 04/29/2013 04:42     Performed at [facility]   Culture     Final   Value: NO GROWTH 5 DAYS     Performed at [facility]   Report Status 05/05/2013 FINAL   Final     Studies:  Recent x-ray studies have been reviewed in detail by the Attending Physician       Erin Hearing, ANP [facility] Office  [phone] Pager [phone]  **If unable to reach the above provider after paging please contact the [place] @ [phone]  On-Call/Text Page:      [email or web address]      password [email or web address]  If 7PM-7AM, please contact night-coverage [email or web address] Password [email or web address] 05/05/2013, 2:22 PM   LOS: 7 days   I have personally examined this patient and reviewed the entire database. I have reviewed the  above note, made any necessary editorial changes, and agree with its content.  Cherene Altes, MD [facility]

## 2013-05-05 NOTE — Progress Notes (Signed)
Patient did not want to wear BIPAP tonight.  Patient was in no distress.

## 2013-05-05 NOTE — Evaluation (Signed)
Occupational Therapy Evaluation Patient Details Name: [name] MRN: [id number] DOB: [birth year] Today's Date: 05/05/2013    History of Present Illness Pt admitted with SOB and PNA   Clinical Impression   Pt presents with variable HR and poor activity tolerance interfering with ability to perform ADL.  Pt requires min assist for toilet transfers. Began instruction in energy conservation.  Will follow for ADL training.    Follow Up Recommendations  SNF    Equipment Recommendations   (defer to SNF)    Recommendations for Other Services       Precautions / Restrictions Precautions Precautions: Fall Precaution Comments: watch HR Restrictions Weight Bearing Restrictions: No      Mobility Bed Mobility Overal bed mobility: Needs Assistance Bed Mobility: Supine to Sit     Supine to sit: Min assist;HOB elevated (use of rail)        Transfers Overall transfer level: Needs assistance   Transfers: Sit to/from Stand;Stand Pivot Transfers Sit to Stand: Min assist Stand pivot transfers: Min assist            Balance Overall balance assessment: Needs assistance Sitting-balance support: Feet supported Sitting balance-Leahy Scale: Good     Standing balance support: During functional activity Standing balance-Leahy Scale: Fair                      ADL Eating/Feeding: Independent;Sitting Grooming: Brushing hair;Wash/dry hands;Sitting;Set up   Upper Body Dressing : Set up;Sitting Lower Body Bathing: Maximal assistance;Sit to/from stand Lower Body Dressing: Maximal assistance;Sit to/from stand Toilet Transfer: Minimal assistance;Stand-pivot;BSC Toileting- Water quality scientist and Hygiene: Min guard;Sit to/from stand   Functional mobility during ADLs: Minimal assistance General ADL Comments: Instructed pt in purse lip breathing, energy conservation.     Vision                     Perception     Praxis      Pertinent Vitals/Pain Variable  HR, monitored throughout, on 3 L 02 with sats in 90s, no pain reported     Hand Dominance Right   Extremity/Trunk Assessment Upper Extremity Assessment Upper Extremity Assessment: Overall WFL for tasks assessed   Lower Extremity Assessment Lower Extremity Assessment: Defer to PT evaluation   Cervical / Trunk Assessment Cervical / Trunk Assessment: Normal   Communication Communication Communication: No difficulties   Cognition Arousal/Alertness: Awake/alert Behavior During Therapy: WFL for tasks assessed/performed Overall Cognitive Status: Within Functional Limits for tasks assessed                     General Comments       Exercises      Home Living Family/patient expects to be discharged to:: Skilled nursing facility                                        Prior Functioning/Environment Level of Independence: Needs assistance    ADL's / Homemaking Assistance Needed: husband did housekeeping, also has paid help for housecleaning        OT Diagnosis:     OT Problem List: Decreased activity tolerance;Decreased strength;Impaired balance (sitting and/or standing);Decreased knowledge of use of DME or AE;Cardiopulmonary status limiting activity;Obesity   OT Treatment/Interventions: Self-care/ADL training;Energy conservation;DME and/or AE instruction;Patient/family education;Balance training    OT Goals(Current goals can be found in the care plan section) Acute Rehab OT Goals Patient Stated  Goal: not have to use 02 during the day OT Goal Formulation: With patient Time For Goal Achievement: 05/19/13 Potential to Achieve Goals: Good ADL Goals Pt Will Perform Grooming: with supervision;standing (2 activities) Pt Will Perform Lower Body Bathing: with supervision;sit to/from stand Pt Will Perform Lower Body Dressing: with supervision;sit to/from stand Pt Will Transfer to Toilet: with supervision;ambulating;bedside commode (over toilet) Pt Will  Perform Toileting - Clothing Manipulation and hygiene: with supervision;sit to/from stand Additional ADL Goal #1: Pt will employ energy conservation strategies during ADL with minimal verbal cues.  OT Frequency: Min 2X/week   Barriers to D/C:            End of Session:    Activity Tolerance: Patient limited by fatigue Patient left: in chair;with call bell/phone within reach;with family/visitor present (PT with pt)   Time: 8676-7209 OT Time Calculation (min): 44 min Charges:  OT General Charges $OT Visit: 1 Procedure OT Evaluation $Initial OT Evaluation Tier I: 1 Procedure OT Treatments $Self Care/Home Management : 23-37 mins G-Codes:    Malka So 05/05/2013, 11:30 AM [phone]

## 2013-05-05 NOTE — Progress Notes (Signed)
Patient ambulated from the bed to the bedroom door using walker. 3L 02.  Patient was SOB and required frequent stops. Elmarie Shiley R

## 2013-05-06 LAB — BASIC METABOLIC PANEL
BUN: 34 mg/dL — ABNORMAL HIGH (ref 6–23)
CALCIUM: 9.7 mg/dL (ref 8.4–10.5)
CO2: 29 meq/L (ref 19–32)
CREATININE: 0.6 mg/dL (ref 0.50–1.10)
Chloride: 98 mEq/L (ref 96–112)
GFR calc Af Amer: >90 mL/min (ref >90)
GFR calc non Af Amer: 86 mL/min — ABNORMAL LOW (ref >90)
Glucose, Bld: 42 mg/dL — CL (ref 70–99)
Potassium: 3.3 mEq/L — ABNORMAL LOW (ref 3.7–5.3)
Sodium: 143 mEq/L (ref 137–147)

## 2013-05-06 LAB — GLUCOSE, CAPILLARY
GLUCOSE-CAPILLARY: 213 mg/dL — AB (ref 70–99)
Glucose-Capillary: 167 mg/dL — ABNORMAL HIGH (ref 70–99)
Glucose-Capillary: 48 mg/dL — ABNORMAL LOW (ref 70–99)
Glucose-Capillary: 109 mg/dL — ABNORMAL HIGH (ref 70–99)
Glucose-Capillary: 195 mg/dL — ABNORMAL HIGH (ref 70–99)

## 2013-05-06 LAB — T4, FREE: Free T4: 1.43 ng/dL (ref 0.80–1.80)

## 2013-05-06 MED ORDER — DEXTROSE 50 % IV SOLN
INTRAVENOUS | Status: AC
  Filled 2013-05-06: qty 50

## 2013-05-06 MED ORDER — DEXTROSE 50 % IV SOLN
25.0000 mL | Freq: Once | INTRAVENOUS | Status: AC | PRN
  Administered 2013-05-06: 25 mL via INTRAVENOUS

## 2013-05-06 MED ORDER — POTASSIUM CHLORIDE CRYS ER 20 MEQ PO TBCR
40.0000 meq | EXTENDED_RELEASE_TABLET | Freq: Two times a day (BID) | ORAL | Status: AC
  Administered 2013-05-06 – 2013-05-07 (×4): 40 meq via ORAL
  Filled 2013-05-06 (×5): qty 2

## 2013-05-06 NOTE — Progress Notes (Signed)
[facility] [place] [facility]  MIIA BLANKS JGG:[id number] DOB: 1934-03-07 DOA: 04/28/2013 PCP: Noralee Space, MD  Time spent : 35 mins  Brief narrative: 79 y/o admitted 3/25 with Afib with RVR & concern for RLL CAP. Presented with SOB and hypoxia with hypotension. Her VR was >150 sustained. She was admitted to the ICU.  Cardiology was consulted and felt her sx's were exacerbated by underlying acute respiratory failure. She has had persistent RVR. She did have a degree of minor CHF at presentation but quickly developed ARF so diuresis was stopped. Recently transitioned to PO Cardizem. Was on Eliquis prior to admission. She has improved clinically on empiric anbx's for CAP.  HPI/Subjective: No complaints   Assessment/Plan:  Acute respiratory failure with hypoxia:   A) baseline COPD   B) CAP (community acquired pneumonia)   C) Acute exacerbation of chronic diastolic CHF  -no wheezing and lungs are clear on exam and RA sats >92% so not a primary pulmonary issue- suspect current dyspnea secondary to RVR as dyspnea worse with exertion and correlates with higher HRs -completed 7 days of empiric abx therapy for possible CAP -will ask RN to ambulate on RA to see if desats occur- suspect severe deconditioning as etiology -PRN Xopenex nebs only given ongoing RVR -Predisone taper completed -cont Pulmicort nebs  A-fib with RVR  -per Cards -cont CCB -FU with cardiologist at [facility] after dc -rates > 100 but much better controlled- BB added overnight -review of OP CVTS notes reveal pt previously on Amiodarone but asked to be taken off this medicine (? Due to thyroid issues) - no reason given -added IV Lopressor today  Nontoxic multinodular goiter -previous OP evaluations by Drs Chalmers Cater and Harlow Asa -last neck US was in 2011 -last TSH 01/15/13 was low at 0.277 - repeat this admit 0.092, T3 low at 49 so unless T4 (which is pending) elevated this is sick euthyroid  syndrome -check US neck only if labs confirm hyperthyroidism  DIABETES MELLITUS -better controlled -increased Lantus on 3/31 (using instead of home Glucophage) -resumed home Amaryl  HYPERTENSION/Grade 1 chronic diastolic dysfunction -moderate control -only on CCB for rate control -avoid ACE I given renal function -follow w/ low dose BB added   Circumoral herpes simplex -Acyclovir 5% cream  LUNG CANCER  -History of stage IA (T1, N0, 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 rate 20, height 5' (1.524 m), weight 195 lb (88.451 kg), SpO2 88.00%.  Intake/Output Summary (Last 24 hours) at 05/06/13 1540 Last data filed at 05/06/13 0730  Gross per 24 hour  Intake    720 ml  Output    350 ml  Net    370 ml   Exam: General: No acute respiratory distress noted at rest ENT; multiple herpetic type lesions circumoral Lungs: Clear to auscultation bilaterally without wheezes or crackles,  2 L Cardiovascular: Irregular persistently tachycardic rate and rhythm without murmur gallop or rub normal S1 and  S2, trace peripheral edema or JVD Abdomen: Nontender, nondistended, soft, bowel sounds positive, no rebound, no ascites, no appreciable mass Musculoskeletal: No significant cyanosis, clubbing of bilateral lower extremities Neurological: Alert and oriented x 3  but at times seems slightly confused, moves all extremities x 4 without focal neurological deficits, CN 2-12 intact  Scheduled Meds:  Scheduled Meds: . acidophilus  1 capsule Oral Daily  . acyclovir cream   Topical Q3H  . antiseptic oral rinse  15 mL Mouth Rinse 6 times per day  . apixaban  5 mg Oral BID  . budesonide  0.5 mg Nebulization BID  . dextromethorphan-guaiFENesin  1 tablet Oral BID  . dextrose      . diltiazem  360 mg Oral Daily  . famotidine  20 mg Oral BID  . fluticasone  1 spray Each Nare Daily  . insulin aspart  0-20 Units Subcutaneous TID WC  . ipratropium  0.5 mg Nebulization BID  . levalbuterol  0.63 mg Nebulization BID  . metoprolol tartrate  25 mg Oral BID  . potassium chloride  40 mEq Oral BID   Data Reviewed: Basic Metabolic Panel:  Recent Labs Lab 04/30/13 0236  05/02/13 0253 05/03/13 0308 05/04/13 0305 05/05/13 0233 05/06/13 0515  NA 137  < > 139 144 145 141 143  K 5.1  < > 4.2 4.1 3.7 3.9 3.3*  CL 98  < > 96 99 96 97 98  CO2 24  < > 27 28 33* 31 29  GLUCOSE 162*  < > 163* 182* 186* 97 42*  BUN 56*  < > 57* 47* 43* 41* 34*  CREATININE 1.65*  < > 1.06 0.77 0.70 0.68 0.60  CALCIUM 8.5  < > 8.5 9.0 9.5 9.9 9.7  MG 1.8  --  2.1 2.1 2.1  --   --   PHOS 4.9*  --  3.2 2.6 2.5  --   --   < > = values in this interval not displayed. Liver Function Tests:  Recent Labs Lab 05/01/13 0305  AST 40*  ALT 97*  ALKPHOS 45  BILITOT 0.3  PROT 6.4  ALBUMIN 2.2*   CBC:  Recent Labs Lab 04/30/13 0236 05/01/13 0305 05/02/13 0253 05/03/13 0308 05/04/13 0305  WBC 7.4 9.1 7.6 5.3 7.2  HGB 11.6* 11.3* 11.8* 12.2 13.5  HCT 35.0* 33.7* 34.3* 36.0 40.5  MCV 97.2 96.6 95.3 96.0 96.9  PLT 175 181 211 224 294    Cardiac Enzymes: No results found for this basename: CKTOTAL, CKMB, CKMBINDEX, TROPONINI,  in the last 168 hours BNP (last 3 results)  Recent Labs  01/15/13 1750 04/28/13 1806 05/01/13 0305  PROBNP 253.6 951.9* 675.4*   CBG:  Recent  Labs Lab 05/05/13 1814 05/05/13 2035 05/06/13 0637 05/06/13 0700 05/06/13 1126  GLUCAP 124* 102* 48* 167* 109*    Recent Results (from the past 240 hour(s))  MRSA PCR SCREENING     Status: None   Collection Time    04/28/13 11:54 PM      Result Value Ref Range Status   MRSA by PCR NEGATIVE  NEGATIVE Final   Comment:            The GeneXpert MRSA Assay (FDA     approved for NASAL specimens     only), is one component of a     comprehensive MRSA colonization     surveillance program. It is not     intended to diagnose MRSA     infection  nor to guide or     monitor treatment for     MRSA infections.  CULTURE, BLOOD (ROUTINE X 2)     Status: None   Collection Time    04/29/13  1:33 AM      Result Value Ref Range Status   Specimen Description BLOOD RIGHT ARM   Final   Special Requests BOTTLES DRAWN AEROBIC ONLY 8CC   Final   Culture  Setup Time     Final   Value: 04/29/2013 04:42     Performed at [facility]   Culture     Final   Value: NO GROWTH 5 DAYS     Performed at [facility]   Report Status 05/05/2013 FINAL   Final  CULTURE, BLOOD (ROUTINE X 2)     Status: None   Collection Time    04/29/13  1:45 AM      Result Value Ref Range Status   Specimen Description BLOOD RIGHT WRIST   Final   Special Requests BOTTLES DRAWN AEROBIC ONLY 8CC   Final   Culture  Setup Time     Final   Value: 04/29/2013 04:42     Performed at [facility]   Culture     Final   Value: NO GROWTH 5 DAYS     Performed at [facility]   Report Status 05/05/2013 FINAL   Final     Studies:  Recent x-ray studies have been reviewed in detail by the Attending Physician       Erin Hearing, ANP [facility] Office  [phone] Pager [phone]  **If unable to reach the above provider after paging please contact the [place] @ [phone]  On-Call/Text Page:      [email or web address]      password [email or web address]  If 7PM-7AM, please contact  night-coverage [email or web address] Password [facility] 05/06/2013, 3:40 PM   LOS: 8 days    I have examined the patient, reviewed the chart and modified the above note which I agree with.   Gaylin Bulthuis,MD Pager # [email or web address] 05/06/2013, 6:04 PM

## 2013-05-06 NOTE — Progress Notes (Signed)
Pt with Heart rate 130-140's with times going up to 150's. Pt up in chair without distress. [facility] on call paged and made aware of HR and B/p on flow sheet. There are no current PRN orders for HR. Will continue to assess. Md ordering PRN Lopressor for HR>150.

## 2013-05-06 NOTE — Progress Notes (Signed)
CSW spoke to patient about going to [facility] at discharge. Patient states she is nervous about transitioning into there, stating, " I am a little scared, I've never had to do something like this." CSW provided support to patient. CSW explained the transition process and stayed with patient until her questions were answered. CSW provided phone number to patient incase patient's husband had questions.  Jeanette Caprice, MSW, [place]

## 2013-05-06 NOTE — Progress Notes (Signed)
Hypoglycemic Event  CBG: 48  Treatment: D50 IV 25 mL  Symptoms: sleepy  Follow-up CBG: Time:0700 CBG Result:167  Possible Reasons for Event: Unknown  Comments/MD notified:     Paulo Fruit  Remember to initiate Hypoglycemia Order Set & complete

## 2013-05-06 NOTE — Progress Notes (Signed)
Patient ambulated from chair to bed on room air. 02 Sats drop as low as 87% on room air. Patient extremely SOB and dyspneic. Patient asked to stop at the door, stated that she could not go any further. HR up to 150's with activity.

## 2013-05-06 NOTE — Progress Notes (Signed)
SUBJECTIVE:  Still not breathing better.     PHYSICAL EXAM Filed Vitals:   05/06/13 0300 05/06/13 0430 05/06/13 0437 05/06/13 0527  BP:  165/85    Pulse: 115 125  110  Temp:  98.7 F (37.1 C)    TempSrc:  Oral    Resp:  20    Height:      Weight:   195 lb (88.451 kg)   SpO2:  95%     General:  No distress but frail with   Lungs:  Decreased breath sounds Heart:  Irregular Abdomen:  Positive bowel sounds, no rebound no guarding Extremities:  No edema  LABS:  Results for orders placed during the hospital encounter of 04/28/13 (from the past 24 hour(s))  GLUCOSE, CAPILLARY     Status: Abnormal   Collection Time    05/05/13 12:06 PM      Result Value Ref Range   Glucose-Capillary 128 (*) 70 - 99 mg/dL   Comment 1 Documented in Chart    GLUCOSE, CAPILLARY     Status: Abnormal   Collection Time    05/05/13  4:29 PM      Result Value Ref Range   Glucose-Capillary 62 (*) 70 - 99 mg/dL   Comment 1 Notify RN     Comment 2 Documented in Chart    GLUCOSE, CAPILLARY     Status: Abnormal   Collection Time    05/05/13  6:14 PM      Result Value Ref Range   Glucose-Capillary 124 (*) 70 - 99 mg/dL  GLUCOSE, CAPILLARY     Status: Abnormal   Collection Time    05/05/13  8:35 PM      Result Value Ref Range   Glucose-Capillary 102 (*) 70 - 99 mg/dL  BASIC METABOLIC PANEL     Status: Abnormal   Collection Time    05/06/13  5:15 AM      Result Value Ref Range   Sodium 143  137 - 147 mEq/L   Potassium 3.3 (*) 3.7 - 5.3 mEq/L   Chloride 98  96 - 112 mEq/L   CO2 29  19 - 32 mEq/L   Glucose, Bld 42 (*) 70 - 99 mg/dL   BUN 34 (*) 6 - 23 mg/dL   Creatinine, Ser 0.60  0.50 - 1.10 mg/dL   Calcium 9.7  8.4 - 10.5 mg/dL   GFR calc non Af Amer 86 (*) >90 mL/min   GFR calc Af Amer >90  >90 mL/min  GLUCOSE, CAPILLARY     Status: Abnormal   Collection Time    05/06/13  6:37 AM      Result Value Ref Range   Glucose-Capillary 48 (*) 70 - 99 mg/dL   Comment 1 Documented in Chart     Comment 2 Notify RN    GLUCOSE, CAPILLARY     Status: Abnormal   Collection Time    05/06/13  7:00 AM      Result Value Ref Range   Glucose-Capillary 167 (*) 70 - 99 mg/dL   Comment 1 Documented in Chart     Comment 2 Notify RN      Intake/Output Summary (Last 24 hours) at 05/06/13 0843 Last data filed at 05/06/13 0400  Gross per 24 hour  Intake    720 ml  Output    350 ml  Net    370 ml    ASSESSMENT AND PLAN:  Atrial fib:    HR still elevated.  She is on maximal Cardizem.  HR is related to continued dyspnea/tachypnea.  She reports that she is not yet improved from this standpoint.  I do not think that further attempts at rate control is the primary issue.  Consider pulmonary consult.      Jeneen Rinks [facility] 05/06/2013 8:43 AM

## 2013-05-07 LAB — GLUCOSE, CAPILLARY
GLUCOSE-CAPILLARY: 161 mg/dL — AB (ref 70–99)
GLUCOSE-CAPILLARY: 156 mg/dL — AB (ref 70–99)
Glucose-Capillary: 142 mg/dL — ABNORMAL HIGH (ref 70–99)
Glucose-Capillary: 155 mg/dL — ABNORMAL HIGH (ref 70–99)

## 2013-05-07 MED ORDER — METOPROLOL TARTRATE 50 MG PO TABS
50.0000 mg | ORAL_TABLET | Freq: Two times a day (BID) | ORAL | Status: DC
  Administered 2013-05-07 – 2013-05-10 (×7): 50 mg via ORAL
  Filled 2013-05-07 (×9): qty 1

## 2013-05-07 NOTE — Progress Notes (Signed)
Occupational Therapy Treatment Patient Details Name: [name] MRN: [id number] DOB: 1934-05-18 Today's Date: 05/07/2013    History of present illness Pt admitted with SOB and PNA   OT comments  This 79 yo making progress with DOE/SOB being her biggest obstacle. Will continue to benefit from acute OT with followup at SNF to get to an Independent/Mod I level.  Follow Up Recommendations  SNF    Equipment Recommendations   (TBD at SNF)       Precautions / Restrictions Precautions Precautions: Fall Precaution Comments: watch HR Restrictions Weight Bearing Restrictions: No       Mobility Bed Mobility               General bed mobility comments: Sitting EOB upon arrival (due to "it's easier to breath")  Transfers Overall transfer level: Needs assistance Equipment used: Rolling walker (2 wheeled) Transfers: Sit to/from Stand Sit to Stand: Min assist         General transfer comment: with VC's for safe hand placement and to control for descent.    Balance Overall balance assessment: Needs assistance Sitting-balance support: Feet supported Sitting balance-Leahy Scale: Good     Standing balance support: Bilateral upper extremity supported;During functional activity Standing balance-Leahy Scale: Fair                     ADL Overall ADL's : Needs assistance/impaired     Grooming: Oral care;Standing (min guard)                   Toilet Transfer: Minimal assistance;Ambulation (straight back chair)             General ADL Comments: Re-instructed pt in purse lip breathing. Pt quite fatigued (DOE) even from just standing to brush her teeth (leaning over in a propped position) with increased time to recover.                Cognition   Behavior During Therapy: WFL for tasks assessed/performed Overall Cognitive Status: Within Functional Limits for tasks assessed                                    Pertinent Vitals/ Pain        At rest on 2 liters of O2 pt sats 98% and HR 60; post activity of standing at sink to brush teeth sats on 2 liters of O2 96% and HR 132.         Frequency Min 2X/week     Progress Toward Goals  OT Goals(current goals can now be found in the care plan section)  Progress towards OT goals: Progressing toward goals     Plan Discharge plan remains appropriate       End of Session Equipment Utilized During Treatment: Rolling walker;Oxygen (at 2 liters)   Activity Tolerance Patient limited by fatigue   Patient Left in chair;with call bell/phone within reach;with family/visitor present           Time: 1324-4010 OT Time Calculation (min): 30 min  Charges: OT General Charges $OT Visit: 1 Procedure OT Treatments $Self Care/Home Management : 23-37 mins  Almon Register [phone] 05/07/2013, 9:57 AM

## 2013-05-07 NOTE — Progress Notes (Signed)
Patient Name: [name] Date of Encounter: 05/07/2013  Active Problems:   LUNG CANCER   Nontoxic multinodular goiter   DIABETES MELLITUS   HYPERTENSION   PERIPHERAL VASCULAR DISEASE   COPD   A-fib with RVR after multiple albuterol treatments   Acute respiratory failure with hypoxia   CAP (community acquired pneumonia)    Patient Profile: 79 yo female w/ hx HTN, HLD, PVD, DM, Obesity, GERD, DJD, Afb on Eliquis (refractory to multiple medications, in process of work up for ablation), Asthma, COPD on nocturnal oxygen and hx of lung cancer s/p bilateral upper lobectomies was admitted 03/25 w/ possible RLL CAP. Cards following for afib, RVR.   SUBJECTIVE: Thinks she is breathing better today, unaware of rapid or  irreg HR.   OBJECTIVE Filed Vitals:   05/06/13 2047 05/07/13 0319 05/07/13 0438 05/07/13 0806  BP: 138/80  134/53   Pulse: 133  124   Temp: 99.3 F (37.4 C)  99.3 F (37.4 C)   TempSrc: Oral  Oral   Resp: 21  20 24   Height:      Weight:   195 lb 1.6 oz (88.497 kg)   SpO2: 97% 96% 98%     Intake/Output Summary (Last 24 hours) at 05/07/13 0816 Last data filed at 05/06/13 1856  Gross per 24 hour  Intake    480 ml  Output    401 ml  Net     79 ml   Filed Weights   05/05/13 0400 05/06/13 0437 05/07/13 0438  Weight: 199 lb 8.3 oz (90.5 kg) 195 lb (88.451 kg) 195 lb 1.6 oz (88.497 kg)    PHYSICAL EXAM General: Well developed, well nourished, female in no acute distress. Head: Normocephalic, atraumatic.  Neck: Supple without bruits, JVD not seen elevated. Lungs:  Resp regular and unlabored, wheezy cough, few rales bases, decreased BS. Heart: RRR, S1, S2, no S3, S4, or murmur; no rub. Abdomen: Soft, non-tender, non-distended, BS + x 4.  Extremities: No clubbing, cyanosis, no edema.  Neuro: Alert and oriented X 3. Moves all extremities spontaneously. Psych: Normal affect.  LABS: Basic Metabolic Panel: Recent Labs  05/05/13 0233 05/06/13 0515  NA  141 143  K 3.9 3.3*  CL 97 98  CO2 31 29  GLUCOSE 97 42*  BUN 41* 34*  CREATININE 0.68 0.60  CALCIUM 9.9 9.7   BNP: Pro B Natriuretic peptide (BNP)  Date/Time Value Ref Range Status  05/01/2013  3:05 AM 675.4* 0 - 450 pg/mL Final  04/28/2013  6:06 PM 951.9* 0 - 450 pg/mL Final   Thyroid Function Tests: Recent Labs  05/04/13 1718   T3 49.8  FREE T4 4.43  TSH 0.092*   TELE: Atrial fib, RVR       Current Medications:  . acidophilus  1 capsule Oral Daily  . acyclovir cream   Topical Q3H  . antiseptic oral rinse  15 mL Mouth Rinse 6 times per day  . apixaban  5 mg Oral BID  . budesonide  0.5 mg Nebulization BID  . dextromethorphan-guaiFENesin  1 tablet Oral BID  . diltiazem  360 mg Oral Daily  . famotidine  20 mg Oral BID  . fluticasone  1 spray Each Nare Daily  . insulin aspart  0-20 Units Subcutaneous TID WC  . ipratropium  0.5 mg Nebulization BID  . metoprolol tartrate  25 mg Oral BID  . potassium chloride  40 mEq Oral BID      ASSESSMENT AND  PLAN: Atrial fib w/ RVR - last ofc note says 2nd opinion at [facility], Dr. Lehman Prom plans an ablation if anesthesia agrees. With her poor lung function, they may not. O/w AVN ablation and PPM. MD advise on getting EP consult. Amiodarone was to be considered if OK w/ Pulmonary, but don't see where they ever weighed in. Dr. Jeannine Kitten note from 2011 mentions she was on amio after her lung surgery but told Dr. Arlyce Dice she wanted off and had taken herself off it. The patient does not remember this and is willing to try it again. However, last OV w/ Cecille Rubin 03/04, Dr. Rayann Heman decided not to start amio because of her lungs and pt then saw Dr. Lehman Prom, see above.  Continue current medications.   Otherwise, per IM. Active Problems:   LUNG CANCER   Nontoxic multinodular goiter   DIABETES MELLITUS   HYPERTENSION   PERIPHERAL VASCULAR DISEASE   COPD   A-fib with RVR after multiple albuterol treatments   Acute respiratory failure with hypoxia   CAP  (community acquired pneumonia)   Signed, Rosaria Ferries , PA-C 8:16 AM 05/07/2013  History and all data above reviewed.  Patient examined.  I agree with the findings as above.    The patient exam reveals [id number]  ,  Lungs: Decreased breath sounds  ,  Abd: Positive bowel sounds, no rebound no guarding, Ext Trace edema  .  All available labs, radiology testing, previous records reviewed. Agree with documented assessment and plan. If indeed her lungs are as good as they are going to get then the HR response needs to be dealt with more definitively.  Atrial fib ablation has been discussed at [facility] [place] but per her husband would require general anesthesia as he understood it.  I don't think amiodarone is the optimal long term choice.  I will discuss with Dr. Lovena Le AV ablation/pacing.  I discussed this at length with the patient and her husband.   Jeneen Rinks [facility]  10:52 AM  05/07/2013

## 2013-05-07 NOTE — Consult Note (Signed)
ELECTROPHYSIOLOGY CONSULT NOTE    Patient ID: [name] MRN: [id number], DOB/AGE: 07-21-1934 79 y.o.  Admit date: 04/28/2013 Date of Consult: 05-07-13  Primary Physician: Noralee Space, MD Electrophysiologist: Thompson Grayer, MD  Reason for Consultation: atrial fibrillation  HPI:  [name] is a 79 y.o. female with a past medical history of permanent atrial fibrillation, COPD, lung cancer with prior lobectomies, diabetes, and asthma.  She has seen multiple pulmonary specialists in the past but now is followed by Dr Lenna Gilford.  She has previously failed medical therapy with Flecainide and Tikosyn.  She was referred to Dr Lehman Prom at [facility] for convergent atrial fibrillation ablation but she and her family were concerned about the risks with general anesthesia.  She was admitted 04-28-13 with pneumonia and has been hospitalized since.  She still has persistent shortness of breath and afib with RVR despite rate control attempts. EP has been asked to evaluate for treatment options.   Last echo 09/2012 demonstrated EF 55-60%, moderate LVH, grade 1 diastolic dysfunction, LA 29.  Lab work is notable for TSH 0.092, normal FT4, FT3 49.8.    She reports weakness and shortness of breath. She denies chest pain, dizziness, or pre-syncope.  ROS is otherwise negative.   Past Medical History  Diagnosis Date  . Asthma   . COPD (chronic obstructive pulmonary disease)   . History of lung cancer   . Hypertension   . Paroxysmal atrial fibrillation   . Peripheral vascular disease   . Venous insufficiency   . Hyperlipidemia   . Diabetes mellitus   . Nontoxic multinodular goiter   . Obesity   . GERD (gastroesophageal reflux disease)   . History of nephrolithiasis   . History of bladder cancer   . DJD (degenerative joint disease)   . Osteopenia   . Anxiety   . History of colon polyps 07/233/2009    Tubular Adenomas  . Diverticulosis of sigmoid colon     mild  . Gastritis     moderate  .  Hemorrhoids, internal   . Claustrophobia   . Thyroid nodule   . Chronic anticoagulation     Eliquis     Surgical History:  Past Surgical History  Procedure Laterality Date  . S/p right rotator cuff repair  1991    Dr. Wonda Olds, right  . S/p turbt  10/01    Dr. Terance Hart  . S/p left tkr  2002    Dr. Gladstone Lighter  . 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 S/p d&c for removal of endometrial polyp (benign)  12/10    GYN  . Colonoscopy    . Esophagogastroduodenoscopy    . Cardioversion N/A 01/17/2013    Procedure: CARDIOVERSION/BEDSIDE (6S06);  Surgeon: Evans Lance, MD;  Location: [facility];  Service: Cardiovascular;  Laterality: N/A;     Prescriptions prior to admission  Medication Sig Dispense Refill  . ALPRAZolam (XANAX) 0.25 MG tablet take 1/2 to 1 tablet by mouth three times a day  90 tablet  1  . apixaban (ELIQUIS) 5 MG TABS tablet Take 5 mg by mouth 2 (two) times daily.      . Biotin 5000 MCG TABS Take 1 tablet by mouth daily.      . budesonide (PULMICORT) 0.5 MG/2ML nebulizer solution Take 0.5 mg by  nebulization 2 (two) times daily.      . Coenzyme Q10 (COQ10) 100 MG CAPS Take 300 mg by mouth daily.      [name] dexlansoprazole (DEXILANT) 60 MG capsule Take 60 mg by mouth daily.      [name] dextromethorphan-guaiFENesin (MUCINEX DM) 30-600 MG per 12 hr tablet Take 1 tablet by mouth every 12 (twelve) hours.      [name] diltiazem (CARDIZEM CD) 240 MG 24 hr capsule Take 240 mg by mouth 2 (two) times daily.       . famotidine (PEPCID) 20 MG tablet Take 20 mg by mouth 2 (two) times daily.       . Fenofibrate (FENOGLIDE) 120 MG TABS Take 1 tablet by mouth daily.      . furosemide (LASIX) 20 MG tablet Take 20 mg by mouth daily.      [name] glimepiride (AMARYL) 2 MG tablet Take 2 mg by mouth 2 (two) times daily.      . irbesartan (AVAPRO) 300 MG  tablet Take 1 tablet (300 mg total) by mouth daily.  30 tablet  6  . levalbuterol (XOPENEX) 0.63 MG/3ML nebulizer solution Take 0.63 mg by nebulization every 4 (four) hours as needed for wheezing or shortness of breath.      . metFORMIN (GLUCOPHAGE) 500 MG tablet Take 500 mg by mouth 2 (two) times daily with a meal.      . mometasone (NASONEX) 50 MCG/ACT nasal spray Place 2 sprays into the nose daily.      . Multiple Vitamin (MULTIVITAMIN PO) Take 1 tablet by mouth daily.       [name] PROAIR HFA 108 (90 BASE) MCG/ACT inhaler inhale 1 to 2 puffs every 4 hours if needed for wheezing  8.5 g  11  . Probiotic Product (ALIGN PO) Take 1 tablet by mouth daily.      . promethazine (PHENERGAN) 12.5 MG tablet take 2 tablets by mouth every 6 hours if needed for nausea  30 tablet  0  . traMADol (ULTRAM) 50 MG tablet Take 50-100 mg by mouth every 6 (six) hours as needed for moderate pain.      [name] Umeclidinium-Vilanterol (ANORO ELLIPTA) 62.5-25 MCG/INH AEPB Inhale 1 puff into the lungs daily.      . vitamin B-12 (CYANOCOBALAMIN) 1000 MCG tablet Take 1,000 mcg by mouth daily.      . Vitamin D, Ergocalciferol, (DRISDOL) 50000 UNITS CAPS Take 1 capsule (50,000 Units total) by mouth every 7 (seven) days.  4 capsule  12    Inpatient Medications:  . acidophilus  1 capsule Oral Daily  . acyclovir cream   Topical Q3H  . antiseptic oral rinse  15 mL Mouth Rinse 6 times per day  . apixaban  5 mg Oral BID  . budesonide  0.5 mg Nebulization BID  . dextromethorphan-guaiFENesin  1 tablet Oral BID  . diltiazem  360 mg Oral Daily  . famotidine  20 mg Oral BID  . fluticasone  1 spray Each Nare Daily  . insulin aspart  0-20 Units Subcutaneous TID WC  . ipratropium  0.5 mg Nebulization BID  . metoprolol tartrate  25 mg Oral BID  . potassium chloride  40 mEq Oral BID    Allergies:  Allergies  Allergen Reactions  . Epinephrine Other (See Comments)    REACTION: nervous, convulsions  . Macrolides And Ketolides Other (See  Comments)    All mycins  Cause yeast infections  . Cephalexin Diarrhea and Nausea Only  . Codeine  Nausea Only  . Morphine Nausea Only  . Sulfamethoxazole-Trimethoprim Diarrhea and Nausea Only    History   Social History  . Marital Status: Married    Spouse Name: N/A    Number of Children: 2  . Years of Education: N/A   Occupational History  . Retired     Social History Main Topics  . Smoking status: Former Smoker -- 0.50 packs/day for 39 years    Types: Cigarettes    Quit date: 02/04/1994  . Smokeless tobacco: Never Used  . Alcohol Use: Yes     Comment: Occasionally  . Drug Use: No  . Sexual Activity: Not Currently   Other Topics Concern  . Not on file   Social History Narrative   Daily caffeine      Family History  Problem Relation Age of Onset  . Asthma Mother   . Heart failure Mother   . Heart attack Father   . Ulcers Father   . Colon cancer Neg Hx     Physical Exam Well appearing obese, 79 yo woman, NAD HEENT: Unremarkable,[place], AT Neck:  6 JVD, no thyromegally Back:  No CVA tenderness Lungs:  Clear with no wheezes, rales, or rhonchi HEART:  Regular rate rhythm, no murmurs, no rubs, no clicks Abd:  soft, positive bowel sounds, no organomegally, no rebound, no guarding Ext:  2 plus pulses, no edema, no cyanosis, no clubbing Skin:  No rashes no nodules Neuro:  CN II through XII intact, motor grossly intact   Labs:   Lab Results  Component Value Date   WBC 7.2 05/04/2013   HGB 13.5 05/04/2013   HCT 40.5 05/04/2013   MCV 96.9 05/04/2013   PLT 294 05/04/2013    Recent Labs Lab 05/01/13 0305  05/06/13 0515  NA 134*  < > 143  K 5.0  < > 3.3*  CL 94*  < > 98  CO2 21  < > 29  BUN 63*  < > 34*  CREATININE 1.34*  < > 0.60  CALCIUM 8.5  < > 9.7  PROT 6.4  --   --   BILITOT 0.3  --   --   ALKPHOS 45  --   --   ALT 97*  --   --   AST 40*  --   --   GLUCOSE 187*  < > 42*  < > = values in this interval not displayed.   Radiology/Studies: Dg Chest  Port 1 View 05/04/2013   CLINICAL DATA:  Short of breath  EXAM: PORTABLE CHEST - 1 VIEW  COMPARISON:  DG CHEST 1V PORT dated 05/03/2013  FINDINGS: Hazy central and basilar airspace disease has not significantly changed. Mild cardiomegaly. No pneumothorax.  IMPRESSION: Hazy bilateral airspace disease unchanged.   Electronically Signed   By: Maryclare Bean M.D.   On: 05/04/2013 07:57   Dg Chest Port 1 View 05/03/2013   CLINICAL DATA:  Respiratory failure.  EXAM: PORTABLE CHEST - 1 VIEW  COMPARISON:  Chest x-ray from yesterday  FINDINGS: Unchanged cardiomegaly. Lung opacities have become asymmetric, with ill-defined airspace disease noted on the right. Haziness of the lower chest may reflect pleural fluid. No evidence of pneumothorax.  IMPRESSION: 1. With improvement in pulmonary edema, pulmonary opacities have become asymmetric to the right, raising the possibility of pneumonia. 2. Small right pleural effusion.   Electronically Signed   By: Jorje Guild M.D.   On: 05/03/2013 06:10   EKG: afib with RVR, ventricular rate 184, QRS  75  TELEMETRY: afib with RVR, ventricular rates 100-120  A/P 1. Atrial fibrillation with an uncontrolled VR 2. HTN 3. COPD Rec: I have discussed the treamtent options with the patient. She has been difficult to control and she has HR's in the 120 range at rest on high dose AV nodal blocking medications.  I have recommended PPM and AV node ablation. She is reflecting. In the interim we will try and uptitrate her AV nodal blocking drugs and hope we can improve her heart rate.  Mikle Bosworth.D.

## 2013-05-07 NOTE — Progress Notes (Signed)
[facility]  [name] XBJ:[id number] DOB: 1935-01-06 DOA: 04/28/2013 PCP: Noralee Space, MD  Time spent : 35 mins  Brief narrative: 79 y/o admitted 3/25 with Afib with RVR & concern for RLL CAP. Presented with SOB and hypoxia with hypotension. Her VR was >150 sustained. She was admitted to the ICU.  Cardiology was consulted and felt her sx's were exacerbated by underlying acute respiratory failure. She has had persistent RVR. She did have a degree of minor CHF at presentation but quickly developed ARF so diuresis was stopped. Was on Eliquis prior to admission. She has improved clinically on empiric anbx's for CAP.  HPI/Subjective: Up in chair- less SOB- denies palpitations  Assessment/Plan:  Acute respiratory failure with hypoxia:   A) baseline COPD   B) CAP (community acquired pneumonia)   C) Acute exacerbation of chronic diastolic CHF  -no wheezing and lungs are clear on exam and RA sats >92% so not a primary pulmonary issue- suspect dyspnea secondary to RVR as dyspnea worse with exertion and correlates with higher HRs -completed 7 days of empiric abx therapy for possible CAP -will ask RN to ambulate on RA to see if desats occur -PRN Xopenex nebs only given ongoing RVR -Predisone taper completed -cont Pulmicort nebs  A-fib with RVR  -per Cards / EP -review of OP CVTS notes reveal pt previously on Amiodarone but asked to be taken off this medicine due to concerns about thyroid and pulmonary complications- discussed again at recent FU visit with Cards who recommended not to use -Cards consulting EP for possible ablation/pacer  Nontoxic multinodular goiter -previous OP evaluations by Drs Chalmers Cater and Harlow Asa -last neck US was in 2011 -last TSH 01/15/13 was low at 0.277 - repeat this admit 0.092, T3 low at 49 and T4 normal so this is c/w  sick euthyroid syndrome  DIABETES MELLITUS -better controlled -increased Lantus on 3/31 (using  instead of home Glucophage) -resumed home Amaryl  HYPERTENSION / Grade 1 chronic diastolic dysfunction -moderate control -only on CCB for rate control -avoid ACE I given renal function -follow w/ low dose BB added   Circumoral herpes simplex -Acyclovir 5% cream  LUNG CANCER  -History of stage IA (T1, N0, 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 rate 24, height 5' (1.524 m), weight 195 lb 1.6 oz (88.497 kg), SpO2 98.00%.  Intake/Output Summary (Last 24 hours) at 05/07/13 1333 Last data filed at 05/07/13 0730  Gross per 24 hour  Intake    480 ml  Output    400 ml  Net     80 ml   Exam: General: No acute respiratory distress at rest ENT; multiple herpetic type lesions circumoral now dried  Lungs: Clear to auscultation bilaterally without wheezes or crackles,  2 L Cardiovascular: Irregular persistently tachycardic rate and rhythm without murmur gallop or rub normal S1 and S2, trace peripheral edema or JVD Abdomen: Nontender, nondistended, soft, bowel sounds  positive, no rebound, no ascites, no appreciable mass Musculoskeletal: No significant cyanosis, clubbing of bilateral lower extremities Neurological: Alert and oriented x 3 but at times seems slightly confused, moves all extremities x 4 without focal neurological deficits, CN 2-12  intact  Scheduled Meds:  Scheduled Meds: . acidophilus  1 capsule Oral Daily  . acyclovir cream   Topical Q3H  . antiseptic oral rinse  15 mL Mouth Rinse 6 times per day  . apixaban  5 mg Oral BID  . budesonide  0.5 mg Nebulization BID  . dextromethorphan-guaiFENesin  1 tablet Oral BID  . diltiazem  360 mg Oral Daily  . famotidine  20 mg Oral BID  . fluticasone  1 spray Each Nare Daily  . insulin aspart  0-20 Units Subcutaneous TID WC  . ipratropium  0.5 mg Nebulization BID  . metoprolol tartrate  25 mg Oral BID  . potassium chloride  40 mEq Oral BID   Data Reviewed: Basic Metabolic Panel:  Recent Labs Lab 05/02/13 0253 05/03/13 0308 05/04/13 0305 05/05/13 0233 05/06/13 0515  NA 139 144 145 141 143  K 4.2 4.1 3.7 3.9 3.3*  CL 96 99 96 97 98  CO2 27 28 33* 31 29  GLUCOSE 163* 182* 186* 97 42*  BUN 57* 47* 43* 41* 34*  CREATININE 1.06 0.77 0.70 0.68 0.60  CALCIUM 8.5 9.0 9.5 9.9 9.7  MG 2.1 2.1 2.1  --   --   PHOS 3.2 2.6 2.5  --   --    Liver Function Tests:  Recent Labs Lab 05/01/13 0305  AST 40*  ALT 97*  ALKPHOS 45  BILITOT 0.3  PROT 6.4  ALBUMIN 2.2*   CBC:  Recent Labs Lab 05/01/13 0305 05/02/13 0253 05/03/13 0308 05/04/13 0305  WBC 9.1 7.6 5.3 7.2  HGB 11.3* 11.8* 12.2 13.5  HCT 33.7* 34.3* 36.0 40.5  MCV 96.6 95.3 96.0 96.9  PLT 181 211 224 294   BNP (last 3 results)  Recent Labs  01/15/13 1750 04/28/13 1806 05/01/13 0305  PROBNP 253.6 951.9* 675.4*   CBG:  Recent Labs Lab 05/06/13 1126 05/06/13 1624 05/06/13 2045 05/07/13 0610 05/07/13 1127  GLUCAP 109* 195* 213* 142* 155*    Recent Results (from the past 240 hour(s))  MRSA PCR SCREENING     Status: None   Collection Time    04/28/13 11:54 PM      Result Value Ref Range Status   MRSA by PCR NEGATIVE  NEGATIVE Final   Comment:            The GeneXpert MRSA Assay (FDA     approved for NASAL specimens     only), is one component of a     comprehensive MRSA  colonization     surveillance program. It is not     intended to diagnose MRSA     infection nor to guide or     monitor treatment for     MRSA infections.  CULTURE, BLOOD (ROUTINE X 2)     Status: None   Collection Time    04/29/13  1:33 AM      Result Value Ref Range Status   Specimen Description BLOOD RIGHT ARM   Final   Special Requests BOTTLES DRAWN AEROBIC ONLY 8CC   Final   Culture  Setup Time     Final   Value: 04/29/2013 04:42     Performed at [facility]  Final   Value: NO GROWTH 5 DAYS     Performed at [facility]   Report Status 05/05/2013 FINAL   Final  CULTURE, BLOOD (ROUTINE X 2)     Status: None   Collection Time    04/29/13  1:45 AM      Result Value Ref Range Status   Specimen Description BLOOD RIGHT WRIST   Final   Special Requests BOTTLES DRAWN AEROBIC ONLY 8CC   Final   Culture  Setup Time     Final   Value: 04/29/2013 04:42     Performed at [facility]   Culture     Final   Value: NO GROWTH 5 DAYS     Performed at [facility]   Report Status 05/05/2013 FINAL   Final     Studies:  Recent x-ray studies have been reviewed in detail by the Attending Physician  Erin Hearing, ANP [facility] Office  [phone] Pager [phone]  **If unable to reach the above provider after paging please contact the [facility] @ [phone]  On-Call/Text Page:      [email or web address]      password [email or web address]  If 7PM-7AM, please contact night-coverage [email or web address] Password [email or web address] 05/07/2013, 1:33 PM   LOS: 9 days   I have personally examined this patient and reviewed the entire database. I have reviewed the above note, made any necessary editorial changes, and agree with its content.  Cherene Altes, MD [facility]

## 2013-05-07 NOTE — Progress Notes (Signed)
Physical Therapy Treatment Patient Details Name: [name] MRN: [id number] DOB: 07/27/1934 Today's Date: 05/07/2013    History of Present Illness Pt admitted with SOB and PNA    PT Comments    Pt slowly improving, but afib and SOB limiting how much pt can do at one time.  During and between each trial of gait, pt's sats stay at acceptable levels (94/95%) on 4 or more litersL, but EHR ran in the130's/140's for 3-4 min after sitting with pt panting ,RR in the 30's.  Attempted frequently to get pt to switch to an efficient breathing method, but with limited success unless she was constantly cued.  Each trial, after several minutes, pt's HR would finally drop down to the 99-110 level.   Follow Up Recommendations  Home health PT (SNF if she needs to do more to help her husband)     Equipment Recommendations  Rolling walker with 5" wheels    Recommendations for Other Services       Precautions / Restrictions Precautions Precautions: Fall Precaution Comments: watch HR Restrictions Weight Bearing Restrictions: No    Mobility  Bed Mobility                  Transfers Overall transfer level: Needs assistance Equipment used: Rolling walker (2 wheeled) Transfers: Sit to/from Stand Sit to Stand: Min assist Stand pivot transfers: Min assist       General transfer comment: cues for safe technique; assist to help her come forward mostly.  5 or more stands throughout treatment  Ambulation/Gait Ambulation/Gait assistance: Min assist;+2 safety/equipment Ambulation Distance (Feet): 35 Feet (40 feet and 35 feet with rests to recover in between) Assistive device: Rolling walker (2 wheeled) Gait Pattern/deviations: Step-through pattern;Decreased step length - right;Decreased step length - left;Trunk flexed Gait velocity: very slow   General Gait Details: mildly unsteady at times today.  Too flexed of posture and too far from RW, but participated well.   Stairs             Wheelchair Mobility    Modified Rankin (Stroke Patients Only)       Balance Overall balance assessment: Needs assistance Sitting-balance support: No upper extremity supported;Feet supported Sitting balance-Leahy Scale: Good     Standing balance support: Bilateral upper extremity supported Standing balance-Leahy Scale: Poor Standing balance comment: needed occasional min steady                    Cognition Arousal/Alertness: Awake/alert Behavior During Therapy: WFL for tasks assessed/performed Overall Cognitive Status: Within Functional Limits for tasks assessed                      Exercises      General Comments        Pertinent Vitals/Pain See above    Home Living                      Prior Function            PT Goals (current goals can now be found in the care plan section) Acute Rehab PT Goals Patient Stated Goal: not have to use 02 during the day PT Goal Formulation: With patient Time For Goal Achievement: 05/09/13 Potential to Achieve Goals: Good Progress towards PT goals: Progressing toward goals    Frequency  Min 3X/week    PT Plan Current plan remains appropriate    Co-evaluation  End of Session Equipment Utilized During Treatment: Oxygen Activity Tolerance: Patient tolerated treatment well;Patient limited by fatigue Patient left: in bed;with call bell/phone within reach;with family/visitor present     Time: 9747-1855 PT Time Calculation (min): 42 min  Charges:  $Gait Training: 23-37 mins $Therapeutic Activity: 23-37 mins                    G Codes:      Lloyde Ludlam, [name] 05/07/2013, 5:05 PM 05/07/2013  [name], [name] [phone]  (pager)

## 2013-05-08 DIAGNOSIS — I369 Nonrheumatic tricuspid valve disorder, unspecified: Secondary | ICD-10-CM

## 2013-05-08 LAB — BASIC METABOLIC PANEL
BUN: 20 mg/dL (ref 6–23)
CHLORIDE: 99 meq/L (ref 96–112)
CO2: 24 meq/L (ref 19–32)
Calcium: 9.8 mg/dL (ref 8.4–10.5)
Creatinine, Ser: 0.59 mg/dL (ref 0.50–1.10)
GFR calc Af Amer: >90 mL/min (ref >90)
GFR calc non Af Amer: 87 mL/min — ABNORMAL LOW (ref >90)
GLUCOSE: 150 mg/dL — AB (ref 70–99)
Potassium: 5.6 mEq/L — ABNORMAL HIGH (ref 3.7–5.3)
SODIUM: 137 meq/L (ref 137–147)

## 2013-05-08 LAB — GLUCOSE, CAPILLARY
GLUCOSE-CAPILLARY: 127 mg/dL — AB (ref 70–99)
GLUCOSE-CAPILLARY: 163 mg/dL — AB (ref 70–99)
GLUCOSE-CAPILLARY: 150 mg/dL — AB (ref 70–99)
Glucose-Capillary: 151 mg/dL — ABNORMAL HIGH (ref 70–99)

## 2013-05-08 NOTE — Progress Notes (Signed)
[facility]  [name] FMB:[id number] DOB: 1934/04/10 DOA: 04/28/2013 PCP: Noralee Space, MD  Time spent : 25 mins  Brief narrative: 79 y/o admitted 3/25 with Afib with RVR & concern for RLL CAP. Presented with SOB and hypoxia with hypotension. Her VR was >150 sustained. She was admitted to the ICU.  Cardiology was consulted and felt her sx's were exacerbated by underlying acute respiratory failure. She had persistent RVR. She did have a degree of minor CHF at presentation but quickly developed ARF so diuresis was stopped. Was on Eliquis prior to admission. She improved clinically on empiric anbx's for CAP.  HPI/Subjective: Up in chair again today.  No new complaints.    Assessment/Plan:  Acute respiratory failure with hypoxia:   A) baseline COPD   B) CAP (community acquired pneumonia)   C) Acute exacerbation of chronic diastolic CHF  -suspect initial dyspnea secondary to RVR > diastolic dysfxn -completed 7 days of empiric abx therapy for possible CAP -PRN Xopenex nebs only given ongoing RVR -Predisone taper completed -cont Pulmicort nebs  A-fib with RVR  -per Cards / EP -review of OP CVTS notes reveal pt previously on Amiodarone but asked to be taken off this medicine due to concerns about thyroid and pulmonary complications- discussed again at recent FU visit with Cards who recommended not to use -EP considering ablation/pacer  Nontoxic multinodular goiter -previous OP evaluations by Drs Chalmers Cater and Harlow Asa -last neck US was in 2011 -last TSH 01/15/13 was low at 0.277 - repeat this admit 0.092, T3 low at 49 and T4 normal so this is c/w  sick euthyroid syndrome  DIABETES MELLITUS -better controlled  HYPERTENSION / Grade 1 chronic diastolic dysfunction - well controlled at present  Circumoral herpes simplex -Acyclovir 5% cream  LUNG CANCER  -History of stage IA (T1, N0, 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 rate 22, height 5' (1.524 m), weight 89.177 kg (196 lb 9.6 oz), SpO2 99.00%.  Intake/Output Summary (Last 24 hours) at 05/08/13 1756 Last data filed at 05/08/13 1235  Gross per 24 hour  Intake    720 ml  Output    450 ml  Net    270 ml   Exam: General: No acute respiratory distress  Lungs: Clear to auscultation bilaterally without wheezes or crackles Cardiovascular: Irregular persistently tachycardic rate and rhythm without murmur gallop or rub, trace peripheral edema  Abdomen: Nontender, nondistended, soft, bowel sounds positive, no rebound, no ascites, no appreciable mass Musculoskeletal: No significant cyanosis, clubbing of bilateral lower extremities  Scheduled Meds:  Scheduled Meds: . acidophilus  1 capsule Oral Daily  . acyclovir cream   Topical Q3H  . antiseptic oral rinse  15 mL Mouth Rinse 6 times per day  . apixaban  5 mg Oral BID  . budesonide  0.5 mg Nebulization BID  . dextromethorphan-guaiFENesin  1 tablet Oral BID  . diltiazem  360  mg Oral Daily  . famotidine  20 mg Oral BID  . fluticasone  1 spray Each Nare Daily  . insulin aspart  0-20 Units Subcutaneous TID WC  . ipratropium  0.5 mg Nebulization BID  . metoprolol tartrate  50 mg Oral BID   Data Reviewed: Basic Metabolic Panel:  Recent Labs Lab 05/02/13 0253 05/03/13 0308  05/04/13 0305 05/05/13 0233 05/06/13 0515 05/08/13 0503  NA 139 144 145 141 143 137  K 4.2 4.1 3.7 3.9 3.3* 5.6*  CL 96 99 96 97 98 99  CO2 27 28 33* 31 29 24   GLUCOSE 163* 182* 186* 97 42* 150*  BUN 57* 47* 43* 41* 34* 20  CREATININE 1.06 0.77 0.70 0.68 0.60 0.59  CALCIUM 8.5 9.0 9.5 9.9 9.7 9.8  MG 2.1 2.1 2.1  --   --   --   PHOS 3.2 2.6 2.5  --   --   --    Liver Function Tests: No results found for this basename: AST, ALT, ALKPHOS, BILITOT, PROT, ALBUMIN,  in the last 168 hours  CBC:  Recent Labs Lab 05/02/13 0253 05/03/13 0308 05/04/13 0305  WBC 7.6 5.3 7.2  HGB 11.8* 12.2 13.5  HCT 34.3* 36.0 40.5  MCV 95.3 96.0 96.9  PLT 211 224 294   BNP (last 3 results)  Recent Labs  01/15/13 1750 04/28/13 1806 05/01/13 0305  PROBNP 253.6 951.9* 675.4*   CBG:  Recent Labs Lab 05/07/13 1615 05/07/13 2143 05/08/13 0550 05/08/13 1127 05/08/13 1613  GLUCAP 161* 156* 127* 151* 163*    Recent Results (from the past 240 hour(s))  MRSA PCR SCREENING     Status: None   Collection Time    04/28/13 11:54 PM      Result Value Ref Range Status   MRSA by PCR NEGATIVE  NEGATIVE Final   Comment:            The GeneXpert MRSA Assay (FDA     approved for NASAL specimens     only), is one component of a     comprehensive MRSA colonization     surveillance program. It is not     intended to diagnose MRSA     infection nor to guide or     monitor treatment for     MRSA infections.  CULTURE, BLOOD (ROUTINE X 2)     Status: None   Collection Time    04/29/13  1:33 AM      Result Value Ref Range Status   Specimen Description BLOOD RIGHT ARM   Final   Special Requests BOTTLES DRAWN AEROBIC ONLY 8CC   Final   Culture  Setup Time     Final   Value: 04/29/2013 04:42     Performed at [facility]   Culture     Final   Value: NO GROWTH 5 DAYS     Performed at [facility]   Report Status 05/05/2013 FINAL   Final  CULTURE, BLOOD (ROUTINE X 2)     Status:  None   Collection Time    04/29/13  1:45 AM      Result Value Ref Range Status   Specimen Description BLOOD RIGHT WRIST   Final   Special Requests BOTTLES DRAWN AEROBIC ONLY 8CC   Final   Culture  Setup Time     Final   Value: 04/29/2013 04:42     Performed at [facility]  Final   Value: NO GROWTH 5 DAYS     Performed at [facility]   Report Status 05/05/2013 FINAL   Final     Studies:  Recent x-ray studies have been reviewed in detail by the Attending Physician  Cherene Altes, MD [facility] For Consults/Admissions - Flow Manager - [phone] Office  [phone] Pager [phone]  On-Call/Text Page:      [email or web address]      password [email or web address]  If 7PM-7AM, please contact night-coverage [email or web address] Password [email or web address] 05/08/2013, 5:56 PM   LOS: 10 days

## 2013-05-08 NOTE — Progress Notes (Signed)
   SUBJECTIVE: The patient feels that she is stable today.  Worked with PT yesterday.  Dyspnea at baseline.  Ventricular rates slightly improved from yesterday though on my visit, VR in the 130-140 range.   CURRENT MEDICATIONS: . acidophilus  1 capsule Oral Daily  . acyclovir cream   Topical Q3H  . antiseptic oral rinse  15 mL Mouth Rinse 6 times per day  . apixaban  5 mg Oral BID  . budesonide  0.5 mg Nebulization BID  . dextromethorphan-guaiFENesin  1 tablet Oral BID  . diltiazem  360 mg Oral Daily  . famotidine  20 mg Oral BID  . fluticasone  1 spray Each Nare Daily  . insulin aspart  0-20 Units Subcutaneous TID WC  . ipratropium  0.5 mg Nebulization BID  . metoprolol tartrate  50 mg Oral BID      OBJECTIVE: Physical Exam: Filed Vitals:   05/07/13 2018 05/07/13 2024 05/08/13 0327 05/08/13 0434  BP: 150/49   123/64  Pulse: 119   109  Temp: 98.4 F (36.9 C)   98.4 F (36.9 C)  TempSrc: Oral   Oral  Resp: 24   24  Height:      Weight:   196 lb 9.6 oz (89.177 kg)   SpO2: 96% 98%  95%    Intake/Output Summary (Last 24 hours) at 05/08/13 0631 Last data filed at 05/08/13 0327  Gross per 24 hour  Intake    720 ml  Output    450 ml  Net    270 ml    Telemetry reveals atrial fibrillation, ventricular rates 90-140  stable appearing obese, 79 yo woman, NAD  HEENT: Unremarkable,[place], AT  Neck: 6 JVD, no thyromegally  Back: No CVA tenderness  Lungs: Clear with no wheezes, rales, or rhonchi, decreased breath sounds  HEART: IRegular tachy rhythm, no murmurs, no rubs, no clicks  Abd: soft, obese, positive bowel sounds, no organomegally, no rebound, no guarding  Ext: 2 plus pulses, 1+ edema, no cyanosis, no clubbing  Skin: No rashes no nodules  Neuro: CN II through XII intact, motor grossly intact  LABS: Basic Metabolic Panel:  Recent Labs  05/06/13 0515  NA 143  K 3.3*  CL 98  CO2 29  GLUCOSE 42*  BUN 34*  CREATININE 0.60  CALCIUM 9.7    RADIOLOGY: Dg Chest  Port 1 View 05/04/2013   CLINICAL DATA:  Short of breath  EXAM: PORTABLE CHEST - 1 VIEW  COMPARISON:  DG CHEST 1V PORT dated 05/03/2013  FINDINGS: Hazy central and basilar airspace disease has not significantly changed. Mild cardiomegaly. No pneumothorax.  IMPRESSION: Hazy bilateral airspace disease unchanged.   Electronically Signed   By: Maryclare Bean M.D.   On: 05/04/2013 07:57   ASSESSMENT AND PLAN:  Active Problems:   LUNG CANCER   Nontoxic multinodular goiter   DIABETES MELLITUS   HYPERTENSION   PERIPHERAL VASCULAR DISEASE   COPD   A-fib with RVR after multiple albuterol treatments   Acute respiratory failure with hypoxia   CAP (community acquired pneumonia)  Rec: I have discussed the situation with the patient and her family. Despite increasing her AV nodal blocking drugs, the patient continues to have rapid atrial fib with an uncontrolled ventricular response. I have reviewed the advantages and disadvantages of AV node ablation with the patient and her family. Will tentatively plan to proceed with this procedure on Monday unless her ventricular rate improves.  Mikle Bosworth.D.

## 2013-05-08 NOTE — Progress Notes (Signed)
  Echocardiogram 2D Echocardiogram has been performed.  Crystina Borrayo FRANCES 05/08/2013, 10:22 AM

## 2013-05-09 LAB — GLUCOSE, CAPILLARY
GLUCOSE-CAPILLARY: 149 mg/dL — AB (ref 70–99)
Glucose-Capillary: 168 mg/dL — ABNORMAL HIGH (ref 70–99)
Glucose-Capillary: 185 mg/dL — ABNORMAL HIGH (ref 70–99)
Glucose-Capillary: 165 mg/dL — ABNORMAL HIGH (ref 70–99)

## 2013-05-09 LAB — BASIC METABOLIC PANEL
BUN: 17 mg/dL (ref 6–23)
CALCIUM: 9.5 mg/dL (ref 8.4–10.5)
CO2: 24 mEq/L (ref 19–32)
Chloride: 100 mEq/L (ref 96–112)
Creatinine, Ser: 0.55 mg/dL (ref 0.50–1.10)
GFR calc non Af Amer: 89 mL/min — ABNORMAL LOW (ref >90)
Glucose, Bld: 153 mg/dL — ABNORMAL HIGH (ref 70–99)
POTASSIUM: 5.1 meq/L (ref 3.7–5.3)
SODIUM: 138 meq/L (ref 137–147)

## 2013-05-09 MED ORDER — FUROSEMIDE 10 MG/ML IJ SOLN
40.0000 mg | Freq: Once | INTRAMUSCULAR | Status: AC
  Administered 2013-05-09: 40 mg via INTRAVENOUS
  Filled 2013-05-09: qty 4

## 2013-05-09 NOTE — Progress Notes (Signed)
Patient ID: [name], female   DOB: [birth year], 79 y.o.   MRN: [id number]   SUBJECTIVE: The patient feels that she is stable today.  Dyspnea at baseline.  Ventricular rates slightly improved from yesterda, VR in the 100-110 range.   CURRENT MEDICATIONS: . acidophilus  1 capsule Oral Daily  . acyclovir cream   Topical Q3H  . antiseptic oral rinse  15 mL Mouth Rinse 6 times per day  . apixaban  5 mg Oral BID  . budesonide  0.5 mg Nebulization BID  . dextromethorphan-guaiFENesin  1 tablet Oral BID  . diltiazem  360 mg Oral Daily  . famotidine  20 mg Oral BID  . fluticasone  1 spray Each Nare Daily  . insulin aspart  0-20 Units Subcutaneous TID WC  . metoprolol tartrate  50 mg Oral BID      OBJECTIVE: Physical Exam: Filed Vitals:   05/08/13 2024 05/08/13 2126 05/09/13 0440 05/09/13 0733  BP:  126/60 130/80 122/57  Pulse:  132 110 120  Temp:  99.1 F (37.3 C) 98.9 F (37.2 C)   TempSrc:  Oral Oral   Resp:  24 24   Height:      Weight:   194 lb 11.2 oz (88.315 kg)   SpO2: 95% 98% 91%     Intake/Output Summary (Last 24 hours) at 05/09/13 0950 Last data filed at 05/09/13 0347  Gross per 24 hour  Intake    720 ml  Output    400 ml  Net    320 ml    Telemetry reveals atrial fibrillation, ventricular rates 90-120  stable appearing obese, 79 yo woman, NAD  HEENT: Unremarkable,[place], AT  Neck: 6 JVD, no thyromegally  Back: No CVA tenderness  Lungs: Clear with no wheezes, rales, or rhonchi, decreased breath sounds  HEART: IRegular tachy rhythm, no murmurs, no rubs, no clicks  Abd: soft, obese, positive bowel sounds, no organomegally, no rebound, no guarding  Ext: 2 plus pulses, 1+ edema, no cyanosis, no clubbing  Skin: No rashes no nodules  Neuro: CN II through XII intact, motor grossly intact  LABS: Basic Metabolic Panel:  Recent Labs  05/08/13 0503 05/09/13 0310  NA 137 138  K 5.6* 5.1  CL 99 100  CO2 24 24  GLUCOSE 150* 153*  BUN 20 17  CREATININE 0.59  0.55  CALCIUM 9.8 9.5    RADIOLOGY: Dg Chest Port 1 View 05/04/2013   CLINICAL DATA:  Short of breath  EXAM: PORTABLE CHEST - 1 VIEW  COMPARISON:  DG CHEST 1V PORT dated 05/03/2013  FINDINGS: Hazy central and basilar airspace disease has not significantly changed. Mild cardiomegaly. No pneumothorax.  IMPRESSION: Hazy bilateral airspace disease unchanged.   Electronically Signed   By: Maryclare Bean M.D.   On: 05/04/2013 07:57   ASSESSMENT AND PLAN:  Active Problems:   LUNG CANCER   Nontoxic multinodular goiter   DIABETES MELLITUS   HYPERTENSION   PERIPHERAL VASCULAR DISEASE   COPD   A-fib with RVR after multiple albuterol treatments   Acute respiratory failure with hypoxia   CAP (community acquired pneumonia)  Rec: I have discussed the situation with the patient and her family. I have recommended proceeding with AV node ablation and PPM if her heart rate does not significantly improve today. Doubtful. She is on maximal AV nodal blocking drugs. She has diastolic heart failure and will give a little IV lasix today.   Mikle Bosworth.D.

## 2013-05-09 NOTE — Progress Notes (Signed)
[facility]  [name] YZJ:[id number] DOB: 1934/07/17 DOA: 04/28/2013 PCP: Noralee Space, MD  Time spent : 15 mins  Brief narrative: 79 y/o admitted 3/25 with Afib with RVR & concern for RLL CAP. Presented with SOB and hypoxia with hypotension. Her VR was >150 sustained. She was admitted to the ICU.  Cardiology was consulted and felt her sx's were exacerbated by underlying acute respiratory failure. She had persistent RVR. She did have a degree of minor CHF at presentation but quickly developed ARF so diuresis was stopped. Was on Eliquis prior to admission. She improved clinically on empiric anbx's for CAP.  HPI/Subjective: Up in chair.  No new complaints.    Assessment/Plan:  Acute respiratory failure with hypoxia:   A) baseline COPD   B) CAP (community acquired pneumonia)   C) Acute exacerbation of chronic diastolic CHF  -suspect initial dyspnea secondary to RVR > diastolic dysfxn -completed 7 days of empiric abx therapy for possible CAP -Predisone taper completed -cont Pulmicort nebs  A-fib with RVR  -per Cards / EP -review of OP CVTS notes reveal pt previously on Amiodarone but asked to be taken off this medicine due to concerns about thyroid and pulmonary complications- discussed again at recent FU visit with Cards who recommended not to use -EP considering ablation/pacer in AM  Nontoxic multinodular goiter -previous OP evaluations by Drs Chalmers Cater and Harlow Asa -last neck US was in 2011 -last TSH 01/15/13 was low at 0.277 - repeat this admit 0.092, T3 low at 49 and T4 normal so this is c/w  sick euthyroid syndrome  DIABETES MELLITUS -better controlled - no change in tx today  HYPERTENSION / Grade 1 chronic diastolic dysfunction - well controlled at present  Circumoral herpes simplex -Acyclovir 5% cream  LUNG CANCER  -History of stage IA (T1, N0, MX) non-small cell lung adenocarcinoma with bronchoalveolar features diagnosed in  November 2005 involving the right upper lobe. 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 rate 24, height 5' (1.524 m), weight 88.315 kg (194 lb 11.2 oz), SpO2 91.00%.  Intake/Output Summary (Last 24 hours) at 05/09/13 1436 Last data filed at 05/09/13 1100  Gross per 24 hour  Intake    480 ml  Output    700 ml  Net   -220 ml   Exam: General: No acute respiratory distress  Lungs: Clear to auscultation bilaterally without wheezes or crackles Cardiovascular: Irregular rate and rhythm without murmur gallop or rub, trace peripheral edema  Abdomen: Nontender, nondistended, soft, bowel sounds positive, no rebound, no ascites, no appreciable mass Musculoskeletal: No significant cyanosis, clubbing of bilateral lower extremities  Scheduled Meds:  Scheduled Meds: . acidophilus  1 capsule Oral Daily  . acyclovir cream   Topical Q3H  . antiseptic oral rinse  15 mL Mouth Rinse 6 times per day  . budesonide  0.5 mg Nebulization BID  . dextromethorphan-guaiFENesin  1 tablet Oral BID  . diltiazem  360 mg Oral Daily  . famotidine  20 mg Oral BID  . fluticasone  1 spray Each Nare Daily  . insulin aspart  0-20 Units Subcutaneous TID WC  . metoprolol tartrate  50 mg Oral BID   Data Reviewed: Basic Metabolic Panel:  Recent Labs Lab 05/03/13 0308 05/04/13 0305 05/05/13 0233 05/06/13 0515 05/08/13 0503 05/09/13 0310  NA 144 145 141 143 137 138  K 4.1 3.7 3.9 3.3* 5.6* 5.1  CL 99  96 97 98 99 100  CO2 28 33* 31 29 24 24   GLUCOSE 182* 186* 97 42* 150* 153*  BUN 47* 43* 41* 34* 20 17  CREATININE 0.77 0.70 0.68 0.60 0.59 0.55  CALCIUM 9.0 9.5 9.9 9.7 9.8 9.5  MG 2.1 2.1  --   --   --   --   PHOS 2.6 2.5  --   --   --   --    Liver Function Tests: No results found for this basename: AST, ALT, ALKPHOS, BILITOT, PROT, ALBUMIN,  in the last 168 hours  CBC:  Recent Labs Lab 05/03/13 0308 05/04/13 0305  WBC 5.3 7.2  HGB 12.2 13.5  HCT 36.0 40.5  MCV 96.0 96.9  PLT 224 294   BNP (last 3 results)  Recent Labs  01/15/13 1750 04/28/13 1806 05/01/13 0305  PROBNP 253.6 951.9* 675.4*   CBG:  Recent Labs Lab 05/08/13 1127 05/08/13 1613 05/08/13 2112 05/09/13 0626 05/09/13 1138  GLUCAP 151* 163* 150* 149* 168*    Studies:  Recent x-ray studies have been reviewed in detail by the Attending Physician  Cherene Altes, MD [facility] For Consults/Admissions - Flow Manager - [phone] Office  [phone] Pager [phone]  On-Call/Text Page:      [email or web address]      password [email or web address]  If 7PM-7AM, please contact night-coverage [email or web address] Password [email or web address] 05/09/2013, 2:36 PM   LOS: 11 days

## 2013-05-10 ENCOUNTER — Encounter (HOSPITAL_COMMUNITY)
Admission: EM | Disposition: A | Discharge status: Skilled Nursing Facility | Payer: Self-pay | Source: Home / Self Care | Attending: Internal Medicine

## 2013-05-10 ENCOUNTER — Ambulatory Visit: Payer: Medicare Other | Admitting: Pulmonary Disease

## 2013-05-10 DIAGNOSIS — I4891 Unspecified atrial fibrillation: Secondary | ICD-10-CM

## 2013-05-10 HISTORY — PX: BI-VENTRICULAR PACEMAKER INSERTION: SHX5462

## 2013-05-10 HISTORY — PX: AV NODE ABLATION: SHX5458

## 2013-05-10 LAB — GLUCOSE, CAPILLARY
GLUCOSE-CAPILLARY: 157 mg/dL — AB (ref 70–99)
GLUCOSE-CAPILLARY: 168 mg/dL — AB (ref 70–99)
Glucose-Capillary: 161 mg/dL — ABNORMAL HIGH (ref 70–99)
Glucose-Capillary: 149 mg/dL — ABNORMAL HIGH (ref 70–99)

## 2013-05-10 SURGERY — BI-VENTRICULAR PACEMAKER INSERTION (CRT-P)
Anesthesia: LOCAL

## 2013-05-10 MED ORDER — SODIUM CHLORIDE 0.9 % IV SOLN: INTRAVENOUS | Status: DC

## 2013-05-10 MED ORDER — FUROSEMIDE 40 MG PO TABS
40.0000 mg | ORAL_TABLET | Freq: Every day | ORAL | Status: DC
  Filled 2013-05-10: qty 1

## 2013-05-10 MED ORDER — BUPIVACAINE HCL (PF) 0.25 % IJ SOLN
INTRAMUSCULAR | Status: AC
  Filled 2013-05-10: qty 30

## 2013-05-10 MED ORDER — FUROSEMIDE 10 MG/ML IJ SOLN
40.0000 mg | Freq: Once | INTRAMUSCULAR | Status: AC
Start: 2013-05-10 — End: 2013-05-10
  Administered 2013-05-10: 40 mg via INTRAVENOUS

## 2013-05-10 MED ORDER — VANCOMYCIN HCL IN DEXTROSE 1-5 GM/200ML-% IV SOLN
1000.0000 mg | INTRAVENOUS | Status: DC
  Filled 2013-05-10 (×2): qty 200

## 2013-05-10 MED ORDER — LIDOCAINE HCL (PF) 1 % IJ SOLN
INTRAMUSCULAR | Status: AC
  Filled 2013-05-10: qty 60

## 2013-05-10 MED ORDER — MIDAZOLAM HCL 5 MG/5ML IJ SOLN
INTRAMUSCULAR | Status: AC
  Filled 2013-05-10: qty 5

## 2013-05-10 MED ORDER — SODIUM CHLORIDE 0.9 % IR SOLN
80.0000 mg | Status: DC
  Filled 2013-05-10 (×2): qty 2

## 2013-05-10 MED ORDER — FENTANYL CITRATE 0.05 MG/ML IJ SOLN
INTRAMUSCULAR | Status: AC
  Filled 2013-05-10: qty 2

## 2013-05-10 MED ORDER — CHLORHEXIDINE GLUCONATE 4 % EX LIQD
60.0000 mL | Freq: Once | CUTANEOUS | Status: AC
  Administered 2013-05-10: 4 via TOPICAL
  Filled 2013-05-10 (×3): qty 60

## 2013-05-10 NOTE — Progress Notes (Addendum)
Occupational Therapy Treatment Patient Details Name: [name] MRN: [id number] DOB: 1935-01-06 Today's Date: 05/10/2013    History of present illness Pt admitted with SOB and PNA   OT comments  Pt limited by fatigue this session. Performed ADLs.   Follow Up Recommendations  SNF    Equipment Recommendations  Other (comment) (defer to SNF)    Recommendations for Other Services      Precautions / Restrictions Precautions Precautions: Fall Precaution Comments: watch HR Restrictions Weight Bearing Restrictions: No       Mobility Bed Mobility Overal bed mobility: Needs Assistance Bed Mobility: Supine to Sit     Supine to sit: Supervision     General bed mobility comments: cues for technique.  Transfers Overall transfer level: Needs assistance Equipment used: Rolling walker (2 wheeled) Transfers: Sit to/from Stand Sit to Stand: Min assist         General transfer comment: Cues for hand placement and technique.    Balance                                   ADL Overall ADL's : Needs assistance/impaired     Grooming: Oral care;Brushing hair;Sitting;Standing (Min guard)       Lower Body Bathing: Standing;Min guard (peri area)           Toilet Transfer: Minimal assistance;Ambulation;RW;Grab bars;Regular Toilet;BSC           Functional mobility during ADLs: Minimal assistance;Rolling walker General ADL Comments: Pt breathing heavily during session. Educated on energy conservation and deep breathing technique. Also, spoke with husband about energy conservation. Pt requiring rest breaks during session.      Vision                     Perception     Praxis      Cognition   Behavior During Therapy: [facility] for tasks assessed/performed Overall Cognitive Status: Within Functional Limits for tasks assessed                       Extremity/Trunk Assessment               Exercises     Shoulder Instructions        General Comments      Pertinent Vitals/ Pain       HR in 130's during session-educated on deep breathing. HR in 80's according to machine, however nurse came in and notified OT that HR in 160's. Transferred pt back to chair to rest. C/o pain in legs when they were touched. Pt in chair at end of session.   Home Living                                          Prior Functioning/Environment              Frequency Min 2X/week     Progress Toward Goals  OT Goals(current goals can now be found in the care plan section)  Progress towards OT goals: Progressing toward goals  Acute Rehab OT Goals Patient Stated Goal: not stated OT Goal Formulation: With patient Time For Goal Achievement: 05/19/13 Potential to Achieve Goals: Good ADL Goals Pt Will Perform Grooming: with supervision;standing (2 activities) Pt Will Perform Lower Body Bathing: with supervision;sit to/from stand  Pt Will Perform Lower Body Dressing: with supervision;sit to/from stand Pt Will Transfer to Toilet: with supervision;ambulating;bedside commode (over toilet) Pt Will Perform Toileting - Clothing Manipulation and hygiene: with supervision;sit to/from stand Additional ADL Goal #1: Pt will employ energy conservation strategies during ADL with minimal verbal cues.  Plan Discharge plan remains appropriate    Co-evaluation                 End of Session Equipment Utilized During Treatment: Gait belt;Rolling walker;Oxygen   Activity Tolerance Patient limited by fatigue   Patient Left in chair;with call bell/phone within reach;with family/visitor present   Nurse Communication Other (comment) (discussed HR)        Time: 5638-9373 OT Time Calculation (min): 24 min  Charges: OT General Charges $OT Visit: 1 Procedure OT Treatments $Self Care/Home Management : 23-37 mins  Benito Mccreedy  OTR/L [phone]  05/10/2013, 1:24 PM

## 2013-05-10 NOTE — Interval H&P Note (Signed)
History and Physical Interval Note: Patient seen and examined. No changed since note above. Will proceed with AV node ablation and PPM insertion. BiV PM is not indicated as her EF is normal. 05/10/2013 4:39 PM  Sharyne Peach  has presented today for surgery, with the diagnosis of tachicardia  The various methods of treatment have been discussed with the patient and family. After consideration of risks, benefits and other options for treatment, the patient has consented to  VVI PM and AV node ablation as a surgical intervention .  The patient's history has been reviewed, patient examined, no change in status, stable for surgery.  I have reviewed the patient's chart and labs.  Questions were answered to the patient's satisfaction.     Cristopher Peru

## 2013-05-10 NOTE — H&P (View-Only) (Signed)
Patient ID: [name], female   DOB: Feb 28, 1934, 79 y.o.   MRN: [id number]   SUBJECTIVE: The patient feels that she is stable today.  Dyspnea at baseline.  Ventricular rates slightly improved from yesterda, VR in the 100-110 range.   CURRENT MEDICATIONS: . acidophilus  1 capsule Oral Daily  . acyclovir cream   Topical Q3H  . antiseptic oral rinse  15 mL Mouth Rinse 6 times per day  . apixaban  5 mg Oral BID  . budesonide  0.5 mg Nebulization BID  . dextromethorphan-guaiFENesin  1 tablet Oral BID  . diltiazem  360 mg Oral Daily  . famotidine  20 mg Oral BID  . fluticasone  1 spray Each Nare Daily  . insulin aspart  0-20 Units Subcutaneous TID WC  . metoprolol tartrate  50 mg Oral BID      OBJECTIVE: Physical Exam: Filed Vitals:   05/08/13 2024 05/08/13 2126 05/09/13 0440 05/09/13 0733  BP:  126/60 130/80 122/57  Pulse:  132 110 120  Temp:  99.1 F (37.3 C) 98.9 F (37.2 C)   TempSrc:  Oral Oral   Resp:  24 24   Height:      Weight:   194 lb 11.2 oz (88.315 kg)   SpO2: 95% 98% 91%     Intake/Output Summary (Last 24 hours) at 05/09/13 0950 Last data filed at 05/09/13 0569  Gross per 24 hour  Intake    720 ml  Output    400 ml  Net    320 ml    Telemetry reveals atrial fibrillation, ventricular rates 90-120  stable appearing obese, 79 yo woman, NAD  HEENT: Unremarkable,, AT  Neck: 6 JVD, no thyromegally  Back: No CVA tenderness  Lungs: Clear with no wheezes, rales, or rhonchi, decreased breath sounds  HEART: IRegular tachy rhythm, no murmurs, no rubs, no clicks  Abd: soft, obese, positive bowel sounds, no organomegally, no rebound, no guarding  Ext: 2 plus pulses, 1+ edema, no cyanosis, no clubbing  Skin: No rashes no nodules  Neuro: CN II through XII intact, motor grossly intact  LABS: Basic Metabolic Panel:  Recent Labs  05/08/13 0503 05/09/13 0310  NA 137 138  K 5.6* 5.1  CL 99 100  CO2 24 24  GLUCOSE 150* 153*  BUN 20 17  CREATININE 0.59  0.55  CALCIUM 9.8 9.5    RADIOLOGY: Dg Chest Port 1 View 05/04/2013   CLINICAL DATA:  Short of breath  EXAM: PORTABLE CHEST - 1 VIEW  COMPARISON:  DG CHEST 1V PORT dated 05/03/2013  FINDINGS: Hazy central and basilar airspace disease has not significantly changed. Mild cardiomegaly. No pneumothorax.  IMPRESSION: Hazy bilateral airspace disease unchanged.   Electronically Signed   By: Maryclare Bean M.D.   On: 05/04/2013 07:57   ASSESSMENT AND PLAN:  Active Problems:   LUNG CANCER   Nontoxic multinodular goiter   DIABETES MELLITUS   HYPERTENSION   PERIPHERAL VASCULAR DISEASE   COPD   A-fib with RVR after multiple albuterol treatments   Acute respiratory failure with hypoxia   CAP (community acquired pneumonia)  Rec: I have discussed the situation with the patient and her family. I have recommended proceeding with AV node ablation and PPM if her heart rate does not significantly improve today. Doubtful. She is on maximal AV nodal blocking drugs. She has diastolic heart failure and will give a little IV lasix today.   Mikle Bosworth.D.

## 2013-05-10 NOTE — Progress Notes (Signed)
[name] WYO:[id number] DOB: January 25, 1935 DOA: 04/28/2013 PCP: [name] Space, MD  Time spent : 15 mins  Brief narrative: 79 y/o admitted 3/25 with Afib with RVR & concern for RLL CAP. Presented with SOB and hypoxia with hypotension. Her VR was >150 sustained. She was admitted to the ICU.  Cardiology was consulted and felt her sx's were exacerbated by underlying acute respiratory failure. She had persistent RVR. She did have a degree of minor CHF at presentation but quickly developed ARF so diuresis was stopped. Was on Eliquis prior to admission. She improved clinically on empiric anbx's for CAP.  HPI/Subjective: Up in chair visiting with family.  No new complaints.    Assessment/Plan:  A-fib with RVR  -per Cards / EP -review of OP CVTS notes reveal pt previously on Amiodarone but asked to be taken off this medicine due to concerns about thyroid and pulmonary complications- discussed again at recent FU visit with Cards who recommended not to use -EP to complete ablation/pacer today  Acute respiratory failure with hypoxia: resolved   A) baseline COPD   B) CAP (community acquired pneumonia)   C) Acute exacerbation of chronic diastolic CHF  -suspect initial dyspnea secondary to RVR > diastolic dysfxn -completed 7 days of empiric abx therapy for possible CAP -Predisone taper completed -cont Pulmicort nebs for baseline COPD control  Nontoxic multinodular goiter -previous OP evaluations by Drs Chalmers Cater and Harlow Asa -last neck US was in 2011 -last TSH 01/15/13 was low at 0.277 - repeat this admit 0.092, T3 low at 49 and T4 normal so this is c/w  sick euthyroid syndrome  DIABETES MELLITUS -better controlled - no change in tx today  HYPERTENSION / Grade 1 chronic diastolic dysfunction -well controlled at present  Circumoral herpes simplex -now dried up/matured and improving nicely   LUNG CANCER  -History of stage IA (T1, N0, MX)  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 rate 20, height 5' (1.524 m), weight 88.95 kg (196 lb 1.6 oz), SpO2 99.00%.  Intake/Output Summary (Last 24 hours) at 05/10/13 1250 Last data filed at 05/10/13 0730  Gross per 24 hour  Intake    960 ml  Output   1950 ml  Net   -990 ml   Exam: General: No acute respiratory distress  Lungs: Clear to auscultation bilaterally without wheezes or crackles Cardiovascular: Irregular rate and rhythm without murmur gallop or rub, trace peripheral edema  Abdomen: Nontender, nondistended, soft, bowel sounds positive, no rebound, no ascites, no appreciable mass Musculoskeletal: No significant cyanosis, clubbing of bilateral lower extremities  Scheduled Meds:  Scheduled Meds: . acidophilus  1 capsule Oral Daily  . acyclovir cream   Topical Q3H  . antiseptic oral rinse  15 mL Mouth Rinse 6 times per day  . budesonide  0.5 mg Nebulization BID  . dextromethorphan-guaiFENesin  1 tablet Oral BID  . diltiazem  360 mg Oral Daily  . famotidine  20 mg Oral BID  . fluticasone  1 spray Each Nare Daily  . [START ON 05/11/2013] furosemide  40 mg Oral Daily  . insulin aspart  0-20 Units Subcutaneous TID WC  . metoprolol tartrate  50 mg Oral BID   Data Reviewed: Basic Metabolic Panel:  Recent  Labs Lab 05/04/13 0305 05/05/13 0233 05/06/13 0515 05/08/13 0503 05/09/13 0310  NA 145 141 143 137 138  K 3.7 3.9 3.3* 5.6* 5.1  CL 96 97 98 99 100  CO2 33* 31 29 24 24   GLUCOSE 186* 97 42* 150* 153*  BUN 43* 41* 34* 20 17  CREATININE 0.70 0.68 0.60 0.59 0.55  CALCIUM 9.5 9.9 9.7 9.8 9.5  MG 2.1  --   --   --   --   PHOS 2.5  --   --   --   --    Liver Function Tests: No results found for this basename: AST, ALT, ALKPHOS, BILITOT, PROT, ALBUMIN,  in the last 168 hours  CBC:  Recent Labs Lab 05/04/13 0305  WBC 7.2  HGB 13.5  HCT 40.5  MCV 96.9  PLT 294   BNP (last 3 results)  Recent Labs  01/15/13 1750 04/28/13 1806 05/01/13 0305  PROBNP 253.6 951.9* 675.4*   CBG:  Recent Labs Lab 05/09/13 1138 05/09/13 1624 05/09/13 2214 05/10/13 0648 05/10/13 1121  GLUCAP 168* 185* 165* 161* 157*   Studies:  Recent x-ray studies have been reviewed in detail by the Attending Physician  Cherene Altes, MD [facility] For Consults/Admissions - Flow Manager - [phone] Office  [phone] Pager [phone]  On-Call/Text Page:      [email or web address]      password [email or web address]  If 7PM-7AM, please contact night-coverage [email or web address] Password [email or web address] 05/10/2013, 12:50 PM   LOS: 12 days

## 2013-05-10 NOTE — Progress Notes (Signed)
Patient Name: [name] Date of Encounter: 05/10/2013  Principal Problem:   Acute respiratory failure with hypoxia Active Problems:   LUNG CANCER   Nontoxic multinodular goiter   DIABETES MELLITUS   HYPERTENSION   PERIPHERAL VASCULAR DISEASE   COPD   Atrial fibrillation with rapid ventricular response   CAP (community acquired pneumonia)    Patient Profile: 79 yo female w/ hx HTN, HLD, PVD, DM, Obesity, GERD, DJD, Afb on Eliquis (refractory to multiple medications, in process of work up for ablation), Asthma, COPD on nocturnal oxygen and hx of lung cancer s/p bilateral upper lobectomies was admitted 03/25 w/ possible RLL CAP. Cards following for afib, RVR.  SUBJECTIVE: Breathing OK, reviewed options plus risks/benefits but she feels PPM and AVN ablation is likely the best. Breathing better after IV Lasix yesterday.  OBJECTIVE Filed Vitals:   05/09/13 0733 05/09/13 2000 05/10/13 0432 05/10/13 0720  BP: 122/57 130/70 139/53 120/66  Pulse: 120 105 55 120  Temp:  98.4 F (36.9 C) 98.1 F (36.7 C)   TempSrc:  Oral Oral   Resp:  24 20   Height:      Weight:   196 lb 1.6 oz (88.95 kg)   SpO2:  96% 97% 99%    Intake/Output Summary (Last 24 hours) at 05/10/13 0958 Last data filed at 05/10/13 0442  Gross per 24 hour  Intake    720 ml  Output   1600 ml  Net   -880 ml   Filed Weights   05/08/13 0327 05/09/13 0440 05/10/13 0432  Weight: 196 lb 9.6 oz (89.177 kg) 194 lb 11.2 oz (88.315 kg) 196 lb 1.6 oz (88.95 kg)    PHYSICAL EXAM General: Well developed, well nourished, female in no acute distress. Head: Normocephalic, atraumatic.  Neck: Supple without bruits, JVD minimal elevation. Lungs:  Resp regular and unlabored, few dry rales, no wheezing. Heart: Rapid and irregular, S1, S2, no S3, S4, or murmur; no rub. Abdomen: Soft, non-tender, non-distended, BS + x 4.  Extremities: No clubbing, cyanosis, 2 + bilateral LE edema.  Neuro: Alert and oriented X 3. Moves all  extremities spontaneously. Psych: Normal affect.  LABS: Basic Metabolic Panel:  Recent Labs  05/08/13 0503 05/09/13 0310  NA 137 138  K 5.6* 5.1  CL 99 100  CO2 24 24  GLUCOSE 150* 153*  BUN 20 17  CREATININE 0.59 0.55  CALCIUM 9.8 9.5   BNP: Pro B Natriuretic peptide (BNP)  Date/Time Value Ref Range Status  05/01/2013  3:05 AM 675.4* 0 - 450 pg/mL Final  04/28/2013  6:06 PM 951.9* 0 - 450 pg/mL Final   TELE:  Atrial fib, Occasionally not RVR      Current Medications:  . acidophilus  1 capsule Oral Daily  . acyclovir cream   Topical Q3H  . antiseptic oral rinse  15 mL Mouth Rinse 6 times per day  . budesonide  0.5 mg Nebulization BID  . dextromethorphan-guaiFENesin  1 tablet Oral BID  . diltiazem  360 mg Oral Daily  . famotidine  20 mg Oral BID  . fluticasone  1 spray Each Nare Daily  . insulin aspart  0-20 Units Subcutaneous TID WC  . metoprolol tartrate  50 mg Oral BID      ASSESSMENT AND PLAN: Atrial fib, RVR: EP has seen, see their note 04/03. Best plan is AVN ablation and PPM, Pt ready to proceed. Will let EP know. Continue current Rx.  Acute on Chronic  diastolic CHF - Pt rec'd IV Lasix yesterday, will use again and add Lasix 40 mg PO daily to meds. PTA was on 20 mg daily. Compression stockings. Down 12 lbs from admission.  Hyperkalemia - Lasix should help.  Anticoagulation - Eliquis d/c'd for procedure. Follow.  Otherwise, per IM. Principal Problem:   Acute respiratory failure with hypoxia Active Problems:   LUNG CANCER   Nontoxic multinodular goiter   DIABETES MELLITUS   HYPERTENSION   PERIPHERAL VASCULAR DISEASE   COPD   Atrial fibrillation with rapid ventricular response   CAP (community acquired pneumonia)   Signed, Rosaria Ferries , PA-C 9:58 AM 05/10/2013   Agree with note by Rosaria Ferries PA-C. Pt with Afib/RVR for AVN abl and PTVPM today with Dr. Hetty Blend, M.D., [place], [facility], [address] [place], [place]  [id number]  [phone] 05/10/2013 1:10 PM

## 2013-05-10 NOTE — Progress Notes (Signed)
Physical Therapy Treatment Patient Details Name: [name] MRN: [id number] DOB: 10-09-1934 Today's Date: 05/10/2013    History of Present Illness Pt admitted with SOB and PNA    PT Comments    Still very dyspneic with ambulation.  Pt to go for ablation and pacer today which should help her situation greatly.  Sats staying in the range on 93-94%  On 3 LNC.  EHR at low in 100's and maxed out in 160's before we could sit down to rest.  Pt showed less problem with recovery today.  Follow Up Recommendations  Home health PT     Equipment Recommendations  Rolling walker with 5" wheels    Recommendations for Other Services       Precautions / Restrictions Precautions Precautions: Fall Precaution Comments: watch HR Restrictions Weight Bearing Restrictions: No    Mobility  Bed Mobility Overal bed mobility: Needs Assistance Bed Mobility: Supine to Sit     Supine to sit: Supervision     General bed mobility comments: cues for technique.  Transfers Overall transfer level: Needs assistance Equipment used: Rolling walker (2 wheeled) Transfers: Sit to/from Stand Sit to Stand: Min assist         General transfer comment: Cues for hand placement and technique.  Ambulation/Gait Ambulation/Gait assistance: Min guard;Min assist Ambulation Distance (Feet): 40 Feet (times 3 with rests in between) Assistive device: Rolling walker (2 wheeled) Gait Pattern/deviations: Step-through pattern;Trunk flexed;Wide base of support Gait velocity: starting to increase speed, but still very slow Gait velocity interpretation: Below normal speed for age/gender General Gait Details: mildly unsteady, rare staggered steps and instability.  Flexed trunk with RW  too far in front.   Stairs            Wheelchair Mobility    Modified Rankin (Stroke Patients Only)       Balance Overall balance assessment: Needs assistance Sitting-balance support: Feet supported;No upper extremity  supported;Bilateral upper extremity supported Sitting balance-Leahy Scale: Good     Standing balance support: Bilateral upper extremity supported Standing balance-Leahy Scale: Poor Standing balance comment: needs the RW to steady herself                    Cognition Arousal/Alertness: Awake/alert Behavior During Therapy: WFL for tasks assessed/performed Overall Cognitive Status: Within Functional Limits for tasks assessed                      Exercises      General Comments        Pertinent Vitals/Pain See above.    Home Living                      Prior Function            PT Goals (current goals can now be found in the care plan section) Acute Rehab PT Goals Patient Stated Goal: not stated PT Goal Formulation: With patient Time For Goal Achievement: 05/17/13 Potential to Achieve Goals: Good Progress towards PT goals: Progressing toward goals    Frequency  Min 3X/week    PT Plan Current plan remains appropriate    Co-evaluation             End of Session Equipment Utilized During Treatment: Oxygen Activity Tolerance: Patient tolerated treatment well;Patient limited by fatigue Patient left: with call bell/phone within reach;with family/visitor present;in chair     Time: 1317-1340 PT Time Calculation (min): 23 min  Charges:  $Gait Training:  8-22 mins $Therapeutic Activity: 8-22 mins                    G Codes:      Tahjae Clausing, [name] 05/10/2013, 3:23 PM 05/10/2013  [name], [place] [phone]  (pager)

## 2013-05-10 NOTE — Progress Notes (Signed)
Pt received into room 2w02, pacer aware of bedrest till 0045, right groin SDI, left chest dressing clean and dry with sling on left arm, pt states no complaints at this time, will continue to monitor Rickard Rhymes, RN

## 2013-05-10 NOTE — CV Procedure (Signed)
SURGEON:  Cristopher Peru, MD     PREPROCEDURE DIAGNOSIS:  Atrial fibrillation with an uncontrolled ventricular response    POSTPROCEDURE DIAGNOSIS:  Same as preprocedure diagnosis     PROCEDURES:   1. Left upper extremity venography.   2. Pacemaker implantation.  3. AV node ablation    INTRODUCTION: [name] is a 79 y.o. female  with a history of uncontrolled atrial fibrillation. She has been on multiple AV nodal blocking drugs, and developed acute on chronic diastolic heart failure. She has failed antiarrhythmic drug therapy as well. In the hospital, on very high dose Cardizem and metoprolol, her ventricular rates while at rest had been between 120 and 140 beats per minute. No reversible causes have been identified. No reversible causes have been identified.  The patient therefore presents today for pacemaker implantation followed by AV node ablation.    DESCRIPTION OF PROCEDURE:  Informed written consent was obtained, and   the patient was brought to the electrophysiology lab in a fasting state.  The patient required no sedation for the procedure today.  The patients left chest was prepped and draped in the usual sterile fashion by the EP lab staff. The skin overlying the left deltopectoral region was infiltrated with lidocaine for local analgesia.  A 4-cm incision was made over the left deltopectoral region.  A left subcutaneous pacemaker pocket was fashioned using a combination of sharp and blunt dissection. Electrocautery was required to assure hemostasis.    Left Upper Extremity Venography: After initial attempts to obtain venous access were unsuccessful, a venogram of the left upper extremity was performed, which revealed a small cephalic vein and a subtotally occluded subclavian/axillary vein    RA/RV Lead Placement: The left vein was punctured, and a guidewire was advanced into the central circulation..  Through the left axillary vein, a St. Jude active-fixation pacemaker, (serial  number [id number]) right ventricular lead was advanced with fluoroscopic visualization into the right ventricular apical septal position.  The right ventricular lead R-waves measured 8 mV with an impedance of 670 ohms and a threshold of 1.1 V at 0.5 msec.  The lead was secured to the pectoralis fascia using #2-0 silk over the suture sleeves.   Device Placement:  The lead was then connected to a St. Jude single chamber (serial number [id number]) pacemaker.  The pocket was irrigated with copious gentamicin solution.  The pacemaker was then placed into the pocket.  The pocket was then closed in 2 layers with 2.0 Vicryl suture for the subcutaneous and subcuticular layers.  Steri-Strips and a sterile dressing were then applied.  There were no early apparent complications. The patient was then prepared for AV node ablation  AV node ablation After the usual preparation and draping of the right femoral vein, the vein was punctured and a 7 French quadripolar ablation catheter was inserted percutaneously under fluoroscopic guidance and advanced into the right atrium. Mapping of the His bundle was carried out. The HV interval was 65 ms. A total of 2 radiofrequency applications were delivered to the AV node. Complete heart block was created. The patient was observed for 15 minutes and had no return of AV conduction. Her permanent pacemaker was reprogrammed to a lower rate of 90 beats per minute.     CONCLUSIONS:   1. Successful implantation of a  [employer]  single-chamber pacemaker followed by the creation of complete heart block in a patient with uncontrolled atrial fibrillation.  2. No early apparent complications.  Cristopher Peru, MD 05/10/2013 6:35 PM

## 2013-05-10 NOTE — Progress Notes (Signed)
Subjective:  SOB  Objective:  Temp:  [98.1 F (36.7 C)-98.4 F (36.9 C)] 98.1 F (36.7 C) (04/06 0432) Pulse Rate:  [55-120] 120 (04/06 0720) Resp:  [20-24] 20 (04/06 0432) BP: (120-139)/(53-70) 120/66 mmHg (04/06 0720) SpO2:  [96 %-99 %] 99 % (04/06 0720) Weight:  [196 lb 1.6 oz (88.95 kg)] 196 lb 1.6 oz (88.95 kg) (04/06 0432) Weight change: 1 lb 6.4 oz (0.635 kg)  Intake/Output from previous day: 04/05 0701 - 04/06 0700 In: 960 [P.O.:960] Out: 1600 [Urine:1600]  Intake/Output from this shift:    Physical Exam: General appearance: alert and mild distress Neck: no adenopathy, no carotid bruit, no JVD, supple, symmetrical, trachea midline and thyroid not enlarged, symmetric, no tenderness/mass/nodules Lungs: clear to auscultation bilaterally Heart: irregularly irregular rhythm Extremities: extremities normal, atraumatic, no cyanosis or edema  Lab Results: Results for orders placed during the hospital encounter of 04/28/13 (from the past 48 hour(s))  GLUCOSE, CAPILLARY     Status: Abnormal   Collection Time    05/08/13 11:27 AM      Result Value Ref Range   Glucose-Capillary 151 (*) 70 - 99 mg/dL  GLUCOSE, CAPILLARY     Status: Abnormal   Collection Time    05/08/13  4:13 PM      Result Value Ref Range   Glucose-Capillary 163 (*) 70 - 99 mg/dL  GLUCOSE, CAPILLARY     Status: Abnormal   Collection Time    05/08/13  9:12 PM      Result Value Ref Range   Glucose-Capillary 150 (*) 70 - 99 mg/dL  BASIC METABOLIC PANEL     Status: Abnormal   Collection Time    05/09/13  3:10 AM      Result Value Ref Range   Sodium 138  137 - 147 mEq/L   Potassium 5.1  3.7 - 5.3 mEq/L   Chloride 100  96 - 112 mEq/L   CO2 24  19 - 32 mEq/L   Glucose, Bld 153 (*) 70 - 99 mg/dL   BUN 17  6 - 23 mg/dL   Creatinine, Ser 0.55  0.50 - 1.10 mg/dL   Calcium 9.5  8.4 - 10.5 mg/dL   GFR calc non Af Amer 89 (*) >90 mL/min   GFR calc Af Amer >90  >90 mL/min   Comment: (NOTE)     The  eGFR has been calculated using the CKD EPI equation.     This calculation has not been validated in all clinical situations.     eGFR's persistently <90 mL/min signify possible Chronic Kidney     Disease.  GLUCOSE, CAPILLARY     Status: Abnormal   Collection Time    05/09/13  6:26 AM      Result Value Ref Range   Glucose-Capillary 149 (*) 70 - 99 mg/dL  GLUCOSE, CAPILLARY     Status: Abnormal   Collection Time    05/09/13 11:38 AM      Result Value Ref Range   Glucose-Capillary 168 (*) 70 - 99 mg/dL  GLUCOSE, CAPILLARY     Status: Abnormal   Collection Time    05/09/13  4:24 PM      Result Value Ref Range   Glucose-Capillary 185 (*) 70 - 99 mg/dL  GLUCOSE, CAPILLARY     Status: Abnormal   Collection Time    05/09/13 10:14 PM      Result Value Ref Range   Glucose-Capillary 165 (*) 70 - 99 mg/dL  GLUCOSE, CAPILLARY     Status: Abnormal   Collection Time    05/10/13  6:48 AM      Result Value Ref Range   Glucose-Capillary 161 (*) 70 - 99 mg/dL    Imaging: Imaging results have been reviewed  Assessment/Plan:   1. Principal Problem: 2.   Acute respiratory failure with hypoxia 3. Active Problems: 4.   LUNG CANCER 5.   Nontoxic multinodular goiter 6.   DIABETES MELLITUS 7.   HYPERTENSION 8.   PERIPHERAL VASCULAR DISEASE 9.   COPD 10.   Atrial fibrillation with rapid ventricular response 11.   CAP (community acquired pneumonia) 7.   Time Spent Directly with Patient:  20 minutes  Length of Stay:  LOS: 12 days   Afib with RVR on neg chronotropes. HR still 130-140. Plan AVN ablation and PTVPM this PM by Dr. Lovena Le.   Diallo Ponder J 05/10/2013, 10:04 AM

## 2013-05-11 ENCOUNTER — Inpatient Hospital Stay (HOSPITAL_COMMUNITY): Payer: Medicare Other

## 2013-05-11 HISTORY — PX: ABLATION: SHX5711

## 2013-05-11 HISTORY — PX: PACEMAKER INSERTION: SHX728

## 2013-05-11 LAB — BASIC METABOLIC PANEL
BUN: 17 mg/dL (ref 6–23)
CO2: 26 mEq/L (ref 19–32)
Calcium: 9.4 mg/dL (ref 8.4–10.5)
Chloride: 99 mEq/L (ref 96–112)
Creatinine, Ser: 0.55 mg/dL (ref 0.50–1.10)
GFR, EST NON AFRICAN AMERICAN: 89 mL/min — AB (ref >90)
Glucose, Bld: 153 mg/dL — ABNORMAL HIGH (ref 70–99)
Potassium: 5.1 mEq/L (ref 3.7–5.3)
Sodium: 139 mEq/L (ref 137–147)

## 2013-05-11 LAB — GLUCOSE, CAPILLARY
GLUCOSE-CAPILLARY: 153 mg/dL — AB (ref 70–99)
GLUCOSE-CAPILLARY: 164 mg/dL — AB (ref 70–99)
GLUCOSE-CAPILLARY: 157 mg/dL — AB (ref 70–99)
Glucose-Capillary: 142 mg/dL — ABNORMAL HIGH (ref 70–99)
Glucose-Capillary: 148 mg/dL — ABNORMAL HIGH (ref 70–99)

## 2013-05-11 MED ORDER — DILTIAZEM HCL ER COATED BEADS 180 MG PO CP24
180.0000 mg | ORAL_CAPSULE | Freq: Every day | ORAL | Status: DC
  Administered 2013-05-11 – 2013-05-12 (×2): 180 mg via ORAL
  Filled 2013-05-11 (×2): qty 1

## 2013-05-11 MED ORDER — VANCOMYCIN HCL IN DEXTROSE 1-5 GM/200ML-% IV SOLN
1000.0000 mg | Freq: Two times a day (BID) | INTRAVENOUS | Status: AC
  Administered 2013-05-11: 1000 mg via INTRAVENOUS
  Filled 2013-05-11: qty 200

## 2013-05-11 MED ORDER — METOPROLOL TARTRATE 25 MG PO TABS
25.0000 mg | ORAL_TABLET | Freq: Two times a day (BID) | ORAL | Status: DC
  Administered 2013-05-11 – 2013-05-12 (×3): 25 mg via ORAL
  Filled 2013-05-11 (×4): qty 1

## 2013-05-11 MED ORDER — ONDANSETRON HCL 4 MG/2ML IJ SOLN: 4.0000 mg | Freq: Four times a day (QID) | INTRAMUSCULAR | Status: DC | PRN

## 2013-05-11 MED ORDER — APIXABAN 5 MG PO TABS
5.0000 mg | ORAL_TABLET | Freq: Two times a day (BID) | ORAL | Status: DC
  Administered 2013-05-12: 5 mg via ORAL
  Filled 2013-05-11 (×2): qty 1

## 2013-05-11 MED ORDER — FUROSEMIDE 10 MG/ML IJ SOLN
80.0000 mg | Freq: Once | INTRAMUSCULAR | Status: AC
Start: 2013-05-11 — End: 2013-05-11
  Administered 2013-05-11: 80 mg via INTRAVENOUS
  Filled 2013-05-11: qty 8

## 2013-05-11 MED ORDER — ACETAMINOPHEN 325 MG PO TABS: 325.0000 mg | ORAL_TABLET | ORAL | Status: DC | PRN

## 2013-05-11 NOTE — Progress Notes (Signed)
Orthopedic Tech Progress Note Patient Details:  [name] [birth year] [id number] Spoke nurse, stated patient already has sling. No action needed from Ortho Tech at this time.  Patient ID: [name], female   DOB: 1934/06/24, 79 y.o.   MRN: [id number]   Fenton Foy 05/11/2013, 10:24 AM

## 2013-05-11 NOTE — Progress Notes (Signed)
[facility]  QUENTIN STREBEL ZOX:[id number] DOB: 1934/02/20 DOA: 04/28/2013 PCP: Noralee Space, MD  Time spent : 15 mins  Brief narrative: 79 y/o admitted 3/25 with Afib with RVR & concern for RLL CAP. Presented with SOB and hypoxia with hypotension. Her VR was >150 sustained. She was admitted to the ICU.  Cardiology was consulted and felt her sx's were exacerbated by underlying acute respiratory failure. She had persistent RVR. She did have a degree of minor CHF at presentation but quickly developed ARF so diuresis was stopped. Was on Eliquis prior to admission. She improved clinically on empiric anbx's for CAP.  HPI/Subjective: Up in chair - SOB this am  Assessment/Plan:  A-fib with RVR  -per Cards / EP -review of OP CVTS notes reveal pt previously on Amiodarone but asked to be taken off this medicine due to concerns about thyroid and pulmonary complications- discussed again at recent FU visit with Cards who recommended not to use -post ablation/PPM 4/6  Acute respiratory failure with hypoxia:   A) baseline COPD   B) CAP (community acquired pneumonia)   C) Acute exacerbation of chronic diastolic CHF  -suspect initial dyspnea secondary to RVR > diastolic dysfxn -completed 7 days of empiric abx therapy for possible CAP -Predisone taper completed -cont Pulmicort nebs for baseline COPD control - 4/7 so Cards has given Lasix IV today  Nontoxic multinodular goiter -previous OP evaluations by Drs Chalmers Cater and Harlow Asa  -last neck US was in 2011 -last TSH 01/15/13 was low at 0.277 - repeat this admit 0.092, T3 low at 49 and T4 normal so this is c/w  sick euthyroid syndrome  DIABETES MELLITUS -remains <200 > 24 hours  HYPERTENSION / Grade 1 chronic diastolic dysfunction -well controlled at present  Circumoral herpes simplex -now dried up/matured and improving nicely   LUNG CANCER  -History of stage IA (T1, N0, 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 rate 19, height 5' (1.524 m), weight 192 lb 14.4 oz (87.5 kg), SpO2 99.00%.  Intake/Output Summary (Last 24 hours) at 05/11/13 1301 Last data filed at 05/11/13 1230  Gross per 24 hour  Intake    480 ml  Output   1050 ml  Net   -570 ml   Exam: General: No acute respiratory distress  Lungs: Clear to auscultation bilaterally without wheezes or crackles-O2 back up to 4L Cardiovascular: Irregular rate and rhythm without murmur gallop or rub, 1-2+ peripheral edema  Abdomen: Nontender, nondistended, soft, bowel sounds positive, no rebound, no ascites, no appreciable mass Musculoskeletal: No significant cyanosis, clubbing of bilateral lower extremities  Scheduled Meds:  Scheduled Meds: . acidophilus  1 capsule Oral Daily  . acyclovir cream   Topical Q3H  . antiseptic oral rinse  15 mL Mouth Rinse 6 times per day  . budesonide  0.5 mg Nebulization BID  . dextromethorphan-guaiFENesin  1 tablet Oral BID  .  diltiazem  180 mg Oral Daily  . famotidine  20 mg Oral BID  . fluticasone  1 spray Each Nare Daily  . insulin aspart  0-20 Units Subcutaneous TID WC  . metoprolol tartrate  25 mg Oral BID   Data Reviewed: Basic Metabolic Panel:  Recent Labs Lab 05/05/13 0233 05/06/13 0515 05/08/13 0503  05/09/13 0310 05/11/13 0558  NA 141 143 137 138 139  K 3.9 3.3* 5.6* 5.1 5.1  CL 97 98 99 100 99  CO2 31 29 24 24 26   GLUCOSE 97 42* 150* 153* 153*  BUN 41* 34* 20 17 17   CREATININE 0.68 0.60 0.59 0.55 0.55  CALCIUM 9.9 9.7 9.8 9.5 9.4   Liver Function Tests: No results found for this basename: AST, ALT, ALKPHOS, BILITOT, PROT, ALBUMIN,  in the last 168 hours  CBC: No results found for this basename: WBC, NEUTROABS, HGB, HCT, MCV, PLT,  in the last 168 hours BNP (last 3 results)  Recent Labs  01/15/13 1750 04/28/13 1806 05/01/13 0305  PROBNP 253.6 951.9* 675.4*   CBG:  Recent Labs Lab 05/10/13 1615 05/10/13 1828 05/10/13 2112 05/11/13 0554 05/11/13 1138  GLUCAP 168* 149* 142* 148* 153*   Studies:  Recent x-ray studies have been reviewed in detail by the Attending Physician  Erin Hearing, ANP [facility] For Consults/Admissions - Flow Manager - [phone] Office  [phone] Pager [phone]  On-Call/Text Page:      [email or web address]      password [email or web address]  If 7PM-7AM, please contact night-coverage [email or web address] Password [email or web address] 05/11/2013, 1:01 PM   LOS: 13 days    I have examined the patient, reviewed the chart and modified the above note which I agree with.   Shayaan Parke,MD Pager # on [email or web address] 05/11/2013, 4:26 PM

## 2013-05-11 NOTE — Progress Notes (Addendum)
Patient ID: [name], female   DOB: Jun 15, 1934, 79 y.o.   MRN: [id number]   Patient Name: [name] Date of Encounter: 05/11/2013     Principal Problem:   Acute respiratory failure with hypoxia Active Problems:   LUNG CANCER   Nontoxic multinodular goiter   DIABETES MELLITUS   HYPERTENSION   PERIPHERAL VASCULAR DISEASE   COPD   Atrial fibrillation with rapid ventricular response   CAP (community acquired pneumonia)    SUBJECTIVE Comfortable after AV node ablation but does note some increased dyspnea  CURRENT MEDS . acidophilus  1 capsule Oral Daily  . acyclovir cream   Topical Q3H  . antiseptic oral rinse  15 mL Mouth Rinse 6 times per day  . budesonide  0.5 mg Nebulization BID  . dextromethorphan-guaiFENesin  1 tablet Oral BID  . diltiazem  180 mg Oral Daily  . famotidine  20 mg Oral BID  . fluticasone  1 spray Each Nare Daily  . furosemide  40 mg Oral Daily  . insulin aspart  0-20 Units Subcutaneous TID WC  . metoprolol tartrate  25 mg Oral BID  . vancomycin  1,000 mg Intravenous Q12H    OBJECTIVE  Filed Vitals:   05/10/13 2134 05/11/13 0418 05/11/13 0427 05/11/13 0804  BP: 122/62  111/56   Pulse: 94  90   Temp:   98.3 F (36.8 C)   TempSrc:   Oral   Resp:   20   Height:      Weight:  192 lb 14.4 oz (87.5 kg)    SpO2:   97% 97%    Intake/Output Summary (Last 24 hours) at 05/11/13 0817 Last data filed at 05/10/13 1230  Gross per 24 hour  Intake      0 ml  Output      0 ml  Net      0 ml   Filed Weights   05/09/13 0440 05/10/13 0432 05/11/13 0418  Weight: 194 lb 11.2 oz (88.315 kg) 196 lb 1.6 oz (88.95 kg) 192 lb 14.4 oz (87.5 kg)    PHYSICAL EXAM  General: Pleasant, morbidly obese, NAD. Neuro: Alert and oriented X 3. Moves all extremities spontaneously. HEENT:  Normal  Neck: Supple without bruits or JVD. Lungs:  Resp regular and unlabored, CTA. Heart: RRR no s3, s4, or murmurs. Abdomen: Soft, non-tender, non-distended, BS + x 4.   Extremities: No clubbing, cyanosis or edema. DP/PT/Radials 2+ and equal bilaterally.  Accessory Clinical Findings  CBC No results found for this basename: WBC, NEUTROABS, HGB, HCT, MCV, PLT,  in the last 72 hours Basic Metabolic Panel  Recent Labs  05/09/13 0310 05/11/13 0558  NA 138 139  K 5.1 5.1  CL 100 99  CO2 24 26  GLUCOSE 153* 153*  BUN 17 17  CREATININE 0.55 0.55  CALCIUM 9.5 9.4   Liver Function Tests No results found for this basename: AST, ALT, ALKPHOS, BILITOT, PROT, ALBUMIN,  in the last 72 hours No results found for this basename: LIPASE, AMYLASE,  in the last 72 hours Cardiac Enzymes No results found for this basename: CKTOTAL, CKMB, CKMBINDEX, TROPONINI,  in the last 72 hours BNP No components found with this basename: POCBNP,  D-Dimer No results found for this basename: DDIMER,  in the last 72 hours Hemoglobin A1C No results found for this basename: HGBA1C,  in the last 72 hours Fasting Lipid Panel No results found for this basename: CHOL, HDL, LDLCALC, TRIG, CHOLHDL, LDLDIRECT,  in  the last 72 hours Thyroid Function Tests No results found for this basename: TSH, T4TOTAL, FREET3, T3FREE, THYROIDAB,  in the last 72 hours  TELE Atrial fib with ventricular pacing    Radiology/Studies  Dg Chest Port 1 View  05/04/2013   CLINICAL DATA:  Short of breath  EXAM: PORTABLE CHEST - 1 VIEW  COMPARISON:  DG CHEST 1V PORT dated 05/03/2013  FINDINGS: Hazy central and basilar airspace disease has not significantly changed. Mild cardiomegaly. No pneumothorax.  IMPRESSION: Hazy bilateral airspace disease unchanged.   Electronically Signed   By: Maryclare Bean M.D.   On: 05/04/2013 07:57   Dg Chest Port 1 View  05/03/2013   CLINICAL DATA:  Respiratory failure.  EXAM: PORTABLE CHEST - 1 VIEW  COMPARISON:  Chest x-ray from yesterday  FINDINGS: Unchanged cardiomegaly. Lung opacities have become asymmetric, with ill-defined airspace disease noted on the right. Haziness of the  lower chest may reflect pleural fluid. No evidence of pneumothorax.  IMPRESSION: 1. With improvement in pulmonary edema, pulmonary opacities have become asymmetric to the right, raising the possibility of pneumonia. 2. Small right pleural effusion.   Electronically Signed   By: Jorje Guild M.D.   On: 05/03/2013 06:10   Dg Chest Port 1 View  05/02/2013   CLINICAL DATA:  Respiratory failure.  EXAM: PORTABLE CHEST - 1 VIEW  COMPARISON:  DG CHEST 1V PORT dated 04/30/2013  FINDINGS: Patchy interstitial densities in the perihilar and lower lung regions. Findings suggest mild edema and there may be slight improvement. Heart size is upper limits of normal. Negative for a pneumothorax.  IMPRESSION: Mild improvement in the interstitial pulmonary edema.   Electronically Signed   By: Markus Daft M.D.   On: 05/02/2013 08:10   Dg Chest Port 1 View  04/30/2013   CLINICAL DATA:  Respiratory failure.  EXAM: PORTABLE CHEST - 1 VIEW  COMPARISON:  DG CHEST 1V PORT dated 04/29/2013  FINDINGS: Cardiomegaly we bilateral pulmonary alveolar infiltrates consistent with congestive heart failure and pulmonary edema. Small right pleural effusion. No pneumothorax. No acute osseous abnormality.  IMPRESSION: Congestive heart failure with bilateral pulmonary edema and small right pleural effusion. Superimposed pneumonia cannot be excluded.   Electronically Signed   By: Marcello Moores  Register   On: 04/30/2013 08:10   Dg Chest Port 1 View  04/29/2013   CLINICAL DATA:  Short of breath  EXAM: PORTABLE CHEST - 1 VIEW  COMPARISON:  DG CHEST 1V PORT dated 04/28/2013  FINDINGS: Moderate cardiomegaly. Right basilar consolidation unchanged. Left lung is relatively clear. No pneumothorax.  IMPRESSION: Stable right basilar consolidation.   Electronically Signed   By: Maryclare Bean M.D.   On: 04/29/2013 07:57   Dg Chest Port 1 View  04/28/2013   CLINICAL DATA:  Shortness of breath  EXAM: PORTABLE CHEST - 1 VIEW  COMPARISON:  01/15/2013  FINDINGS: Cardiac  shadow is mildly enlarged. A small hiatal hernia is noted. The lungs are well aerated with evidence of right basilar infiltrate. This is new from the prior exam. No pneumothorax is seen. Postsurgical changes are noted in the right hilum.  IMPRESSION: Right basilar infiltrate.   Electronically Signed   By: Inez Catalina M.D.   On: 04/28/2013 18:16    ASSESSMENT AND PLAN 1. Dyspnea 2. Atrial fib with an RVR 3. Oxygen dependent COPD 4. Morbid obesity 5. Chronic diastolic heart failure Rec: continue pulmonary toilet and will give IV lasix. Advance activity. Will restart anti-coagulation in the a.m.  Mikle Bosworth.D.  05/11/2013 8:17 AM

## 2013-05-11 NOTE — Progress Notes (Signed)
CSW spoke with patient and patient's husband by bedside about still going to [facility] at discharge. Patient's husband stated he already filled out paperwork and still wants to go there. Patient agreeable to plan. Patient's husband states he would like EMS transport. He verbalized his understanding of EMS transport being billed to insurance and states that its fine. CSW will update SNF facility today.  Jeanette Caprice, MSW, [place]

## 2013-05-12 LAB — BASIC METABOLIC PANEL
BUN: 13 mg/dL (ref 6–23)
CALCIUM: 9 mg/dL (ref 8.4–10.5)
CO2: 25 meq/L (ref 19–32)
Chloride: 99 mEq/L (ref 96–112)
Creatinine, Ser: 0.51 mg/dL (ref 0.50–1.10)
GFR calc Af Amer: >90 mL/min (ref >90)
GLUCOSE: 141 mg/dL — AB (ref 70–99)
POTASSIUM: 4.1 meq/L (ref 3.7–5.3)
SODIUM: 137 meq/L (ref 137–147)

## 2013-05-12 LAB — GLUCOSE, CAPILLARY
GLUCOSE-CAPILLARY: 146 mg/dL — AB (ref 70–99)
Glucose-Capillary: 153 mg/dL — ABNORMAL HIGH (ref 70–99)

## 2013-05-12 MED ORDER — METFORMIN HCL 500 MG PO TABS
500.0000 mg | ORAL_TABLET | Freq: Two times a day (BID) | ORAL | Status: DC
  Filled 2013-05-12 (×2): qty 1

## 2013-05-12 MED ORDER — ACYCLOVIR 5 % EX CREA: 1.0000 "application " | TOPICAL_CREAM | CUTANEOUS | Status: DC

## 2013-05-12 MED ORDER — METOPROLOL TARTRATE 25 MG PO TABS: 25.0000 mg | ORAL_TABLET | Freq: Two times a day (BID) | ORAL | Status: AC

## 2013-05-12 MED ORDER — ALPRAZOLAM 0.25 MG PO TABS: 0.1250 mg | ORAL_TABLET | Freq: Three times a day (TID) | ORAL | Status: DC | PRN

## 2013-05-12 MED ORDER — DILTIAZEM HCL ER COATED BEADS 180 MG PO CP24
180.0000 mg | ORAL_CAPSULE | Freq: Every day | ORAL | Status: DC
Start: 2013-05-12 — End: 2013-06-12

## 2013-05-12 MED ORDER — RISAQUAD PO CAPS: 1.0000 | ORAL_CAPSULE | Freq: Every day | ORAL | Status: AC

## 2013-05-12 NOTE — Discharge Instructions (Signed)
° °  Supplemental Discharge Instructions for  Pacemaker/Defibrillator Patients  Activity No heavy lifting or vigorous activity with your left/right arm for 6 to 8 weeks.  Do not raise your left/right arm above your head for one week.  Gradually raise your affected arm as drawn below.           04/10                      04/11                       04/12                      04/13       NO DRIVING for 1 week; you may begin driving on 67/02/4101, unless otherwise directed by doctor. WOUND CARE   Keep the wound area clean and dry.  Do not get this area wet for one week. No showers for one week; you may shower on 05/19/2013.   The tape/steri-strips on your wound will fall off; do not pull them off.  No bandage is needed on the site.  DO  NOT apply any creams, oils, or ointments to the wound area.   If you notice any drainage or discharge from the wound, any swelling or bruising at the site, or you develop a fever > 101? F after you are discharged home, call the office at once.  Special Instructions   You are still able to use cellular telephones; use the ear opposite the side where you have your pacemaker/defibrillator.  Avoid carrying your cellular phone near your device.   When traveling through airports, show security personnel your identification card to avoid being screened in the metal detectors.  Ask the security personnel to use the hand wand.   Avoid arc welding equipment, MRI testing (magnetic resonance imaging), TENS units (transcutaneous nerve stimulators).  Call the office for questions about other devices.   Avoid electrical appliances that are in poor condition or are not properly grounded.   Microwave ovens are safe to be near or to operate.

## 2013-05-12 NOTE — Progress Notes (Signed)
    Patient: AKIVA JOSEY Date of Encounter: 05/12/2013, 8:05 AM Admit date: 04/28/2013     Subjective  Ms. Top reports persistent SOB, weakness and fatigue. She also reports soreness at [facility] implant site.    Objective  Physical Exam: Vitals: BP 132/59  Pulse 90  Temp(Src) 98.1 F (36.7 C) (Oral)  Resp 18  Ht 5' (1.524 m)  Wt 193 lb 9 oz (87.8 kg)  BMI 37.80 kg/m2  SpO2 98% General: Well developed 79 year old female in no acute distress. Neck: Supple. JVD not elevated. Lungs: Diminished breath sounds bilaterally. No wheezes, rales, or rhonchi.  Heart: S1 S2 present without murmurs, rubs, or gallops.  Abdomen: Soft, non-distended. Extremities: No clubbing or cyanosis. No edema.  Distal pedal pulses are 2+ and equal bilaterally. Neuro: Alert and oriented X 3. Moves all extremities spontaneously. No focal deficits. Skin: Left upper chest / implant site intact without bleeding or hematoma.  Intake/Output:  Intake/Output Summary (Last 24 hours) at 05/12/13 0805 Last data filed at 05/12/13 1025  Gross per 24 hour  Intake    480 ml  Output   2201 ml  Net  -1721 ml    Inpatient Medications:  . acidophilus  1 capsule Oral Daily  . acyclovir cream   Topical Q3H  . antiseptic oral rinse  15 mL Mouth Rinse 6 times per day  . apixaban  5 mg Oral BID  . budesonide  0.5 mg Nebulization BID  . dextromethorphan-guaiFENesin  1 tablet Oral BID  . diltiazem  180 mg Oral Daily  . famotidine  20 mg Oral BID  . fluticasone  1 spray Each Nare Daily  . insulin aspart  0-20 Units Subcutaneous TID WC  . metoprolol tartrate  25 mg Oral BID    Labs:  Recent Labs  05/11/13 0558 05/12/13 0058  NA 139 137  K 5.1 4.1  CL 99 99  CO2 26 25  GLUCOSE 153* 141*  BUN 17 13  CREATININE 0.55 0.51  CALCIUM 9.4 9.0    Radiology/Studies: Dg Chest 2 View  05/11/2013   CLINICAL DATA:  Status post pacemaker  EXAM: CHEST  2 VIEW  COMPARISON:  DG CHEST 1V PORT dated 05/04/2013  FINDINGS: Single  lead cardiac pacer has been placed with generator over the left thorax. Lead projects over the ventricles. No pneumothorax identified.  There is postsurgical change in the right upper lobe, with hazy parenchymal density suggesting scarring. There is a small right pleural effusion, similar to prior study. Mild cardiac enlargement is noted.  IMPRESSION: No pneumothorax status post pacer placement   Electronically Signed   By: Skipper Cliche M.D.   On: 05/11/2013 16:20   Device interrogation: performed by industry yesterday shows normal PPM function   Assessment and Plan  1. Symptomatic atrial fibrillation with uncontrolled V rates, now s/p PPM implantation +AV node ablation 2. Chronic respiratory failure / COPD 3. Chronic diastolic HF 4. CAP Ms. Yuhas is stable post PPM implant +AV node ablation for refractory AFib. Wound intact without bleeding or hematoma. Device interrogation shows normal PPM function. CXR shows stable lead placement. Reviewed DC instructions, wound care and follow-up. Instructions and wound check appointment entered into discharge AVS / EPIC. Recommend restarting anticoagulation today.  Signed, Azzie Roup Keyona Emrich PA-C

## 2013-05-12 NOTE — Progress Notes (Signed)
Physical Therapy Treatment Patient Details Name: [name] MRN: [id number] DOB: 1934/10/08 Today's Date: 05/12/2013    History of Present Illness Pt admitted with SOB and PNA    PT Comments    Pt participated well in therapy today.  Similar distances were ambulated with RW on 4L [place].  Pt maintained sats >90% and up to 97% after short recovery from each walk.  EHR never changed from 90 bpm.  Mild to moderate dyspnea with each walk.  Follow Up Recommendations  SNF     Equipment Recommendations  Other (comment) (TBA at next venue)    Recommendations for Other Services       Precautions / Restrictions Precautions Precautions: Fall    Mobility  Bed Mobility                  Transfers Overall transfer level: Needs assistance Equipment used: Rolling walker (2 wheeled) Transfers: Sit to/from Stand Sit to Stand: Min assist (times 4)         General transfer comment: cues for hand placement, pacer precautions  Ambulation/Gait Ambulation/Gait assistance: Min assist;+2 safety/equipment Ambulation Distance (Feet): 35 Feet (42' then 35 back to chair with min dyspnea) Assistive device: Rolling walker (2 wheeled) Gait Pattern/deviations: Step-through pattern;Wide base of support;Trunk flexed Gait velocity: slow   General Gait Details: mildly unsteady with infrequent halting gait.  Flexed posture and mild to moderate dyspnea.   Stairs            Wheelchair Mobility    Modified Rankin (Stroke Patients Only)       Balance Overall balance assessment: Needs assistance Sitting-balance support: Feet supported;Bilateral upper extremity supported;Single extremity supported Sitting balance-Leahy Scale: Fair     Standing balance support: Bilateral upper extremity supported Standing balance-Leahy Scale: Poor                      Cognition Arousal/Alertness: Awake/alert;Lethargic Behavior During Therapy: WFL for tasks assessed/performed Overall  Cognitive Status: Within Functional Limits for tasks assessed                      Exercises      General Comments        Pertinent Vitals/Pain See above comments    Home Living                      Prior Function            PT Goals (current goals can now be found in the care plan section) Acute Rehab PT Goals PT Goal Formulation: With patient Time For Goal Achievement: 05/17/13 Potential to Achieve Goals: Good Progress towards PT goals: Progressing toward goals    Frequency  Min 3X/week    PT Plan Current plan remains appropriate    Co-evaluation             End of Session Equipment Utilized During Treatment: Oxygen Activity Tolerance: Patient tolerated treatment well;Patient limited by fatigue Patient left: with call bell/phone within reach;with family/visitor present;in chair     Time: 1125-1150 PT Time Calculation (min): 25 min  Charges:  $Gait Training: 8-22 mins $Therapeutic Activity: 8-22 mins                    G CodesTessie Fass Bert Ptacek 05/12/2013, 12:04 PM 05/12/2013  [name], PT [phone]  (pager)

## 2013-05-12 NOTE — Discharge Summary (Signed)
Physician Discharge Summary  [name]:[id number] DOB: February 27, 1934 DOA: 04/28/2013  PCP: Noralee Space, MD  Admit date: 04/28/2013 Discharge date: 05/12/2013  Time spent: >30 minutes  Recommendations for Outpatient Follow-up:  1. Schedule appointment to see Dr. Lenna Gilford in 1-2 weeks after discharge from SNF 2. [facility] at discharge for continued rehabilitation therapy 3. Follow up with Dr. Rayann Heman as scheduled- see [facility] AVS papers 4. Continue nasal cannula oxygen at 2-4 L 5. Weigh daily and notify MD if weight increase by 5 lbs or more over 3 days 6. Post pacer wound care as outlined by Cardiology/EP  Discharge Diagnoses:  Principal Problem:   Acute respiratory failure with hypoxia Active Problems:   COPD-compensated   Atrial fibrillation with rapid ventricular response post ablation and pacemaker   CAP (community acquired pneumonia)-resolved   Nontoxic multinodular goiter-low TSH but normal T3 and Free T4   DIABETES MELLITUS-stable   HYPERTENSION   LUNG CANCER (history of)   PERIPHERAL VASCULAR DISEASE  Discharge Condition: stable  Diet recommendation: CARBOHYDRATE MODIFIED  Filed Weights   05/10/13 0432 05/11/13 0418 05/12/13 0431  Weight: 196 lb 1.6 oz (88.95 kg) 192 lb 14.4 oz (87.5 kg) 193 lb 9 oz (87.8 kg)    History of present illness:  79 y/o admitted 3/25 with Afib with RVR & concern for RLL CAP. Presented with SOB and hypoxia with hypotension. Her VR was >150 sustained. She was admitted to the ICU.   Cardiology was consulted and felt her sx's were exacerbated by underlying acute respiratory failure. She had persistent RVR. She did have a degree of minor CHF at presentation but quickly developed ARF so diuresis was stopped. Was on Eliquis prior to admission. She improved clinically on empiric anbx's for CAP.  Hospital Course:   A-fib with RVR  -per Cards / EP  -review of OP CVTS notes reveal pt previously on Amiodarone but asked to be taken off this  medicine due to concerns about thyroid and pulmonary complications- discussed again at recent FU visit with Cards who recommended not to use  -post ablation/PPM 4/6 and now maintaining a ventricular paced rhythm  Acute respiratory failure with hypoxia:  A) baseline COPD  B) CAP (community acquired pneumonia)  C) Acute exacerbation of chronic diastolic CHF  -suspect initial dyspnea secondary to RVR > diastolic dysfxn  -completed 7 days of empiric abx therapy for possible CAP  -Predisone taper completed  -cont Pulmicort nebs for baseline COPD control  - 4/7 increased dyspnea symptoms sp was given Lasix with about 4 liters out -recommend follow weights and dose Lasix prn based on weight gain- defer to SNF MD or OP Cardiologist  Nontoxic multinodular goiter  -previous OP evaluations by Drs Chalmers Cater and Harlow Asa  -last neck US was in 2011  -last TSH 01/15/13 was low at 0.277 - repeat this admit 0.092, T3 low at 49 and T4 normal so this is c/w sick euthyroid syndrome   DIABETES MELLITUS  -remained <200 > 24 hours on SSI -will resume home Metformin at dc but hold Amaryl -need to check CBGs AC and HS  HYPERTENSION / Grade 1 chronic diastolic dysfunction  -well controlled at present   Circumoral herpes simplex  -now dried up/matured and improving nicely -continue after discharge  LUNG CANCER  -History of stage IA (T1, N0, 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 Systolic function was vigorous. The estimated ejection fraction was in the range of 65% to 70%. Regional wall motion abnormalities cannot be excluded. - Pulmonary  arteries: Systolic pressure was mildly increased. PA peak pressure: 39mm Hg (S).  Pacemaker implantation/ AV node ablation  4/6  Consultations: Cardiology  EP  Pulmonary  Discharge Exam: Filed Vitals:   05/12/13 1336  BP: 126/71  Pulse: 90  Temp: 98.1 F (36.7 C)  Resp: 18   General: No acute respiratory distress -was very sleepy earlier this am after receiving Xanax but has since awakened to baseline mentation- reviewed home meds and discovered Xanax dose 1/2 tab of a 0.25 mg pill Lungs: Clear to auscultation bilaterally without wheezes or crackles-O2 back up to 4L  Cardiovascular: Irregular rate and rhythm without murmur gallop or rub, trace peripheral edema  Abdomen: Nontender, nondistended, soft, bowel sounds positive, no rebound, no ascites, no appreciable mass  Musculoskeletal: No significant cyanosis, clubbing of bilateral lower extremities  Discharge Instructions      Discharge Orders   Future Appointments Provider Department Dept Phone   05/17/2013 10:30 AM [facility] [phone]   Liquids only 4 hours prior to your exam. Any medications can be taken as usual. Please arrive 15 min prior to your scheduled exam time.   05/20/2013 11:00 AM [facility] [place] Office [phone]   Future Orders Complete By Expires   (HEART FAILURE PATIENTS) Call MD:  Anytime you have any of the following symptoms: 1) 3 pound weight gain in 24 hours or 5 pounds in 1 week 2) shortness of breath, with or without a dry hacking cough 3) swelling in the hands, feet or stomach 4) if you have to sleep on extra pillows at night in order to breathe.  As directed    Diet - low sodium heart healthy  As directed    Diet Carb Modified  As directed    Increase activity slowly  As directed    Scheduling Instructions:   Continue PT/OT and other rehabilitative therapies at SNF with goal to return to home when ready.       Medication List      STOP taking these medications       furosemide 20 MG tablet  Commonly known as:  LASIX     glimepiride 2 MG tablet  Commonly known as:  AMARYL     irbesartan 300 MG tablet  Commonly known as:  AVAPRO     promethazine 12.5 MG tablet  Commonly known as:  PHENERGAN      TAKE these medications       acidophilus Caps capsule  Take 1 capsule by mouth daily.     acyclovir cream 5 %  Commonly known as:  ZOVIRAX  Apply 1 application topically every 3 (three) hours. Send current tube with pt to SNF     ALPRAZolam 0.25 MG tablet  Commonly known as:  XANAX  take 1/2 to 1 tablet by mouth three times a day     ANORO ELLIPTA 62.5-25 MCG/INH Aepb  Generic drug:  Umeclidinium-Vilanterol  Inhale 1 puff into the lungs daily.     Biotin 5000 MCG Tabs  Take 1 tablet by mouth daily.     budesonide 0.5 MG/2ML nebulizer solution  Commonly known  as:  PULMICORT  Take 0.5 mg by nebulization 2 (two) times daily.     CoQ10 100 MG Caps  Take 300 mg by mouth daily.     DEXILANT 60 MG capsule  Generic drug:  dexlansoprazole  Take 60 mg by mouth daily.     dextromethorphan-guaiFENesin 30-600 MG per 12 hr tablet  Commonly known as:  MUCINEX DM  Take 1 tablet by mouth every 12 (twelve) hours.     diltiazem 180 MG 24 hr capsule  Commonly known as:  CARDIZEM CD  Take 1 capsule (180 mg total) by mouth daily.     ELIQUIS 5 MG Tabs tablet  Generic drug:  apixaban  Take 5 mg by mouth 2 (two) times daily.     famotidine 20 MG tablet  Commonly known as:  PEPCID  Take 20 mg by mouth 2 (two) times daily.     FENOGLIDE 120 MG Tabs  Generic drug:  Fenofibrate  Take 1 tablet by mouth daily.     levalbuterol 0.63 MG/3ML nebulizer solution  Commonly known as:  XOPENEX  Take 0.63 mg by nebulization every 4 (four) hours as needed for wheezing or shortness of breath.     metFORMIN 500 MG tablet  Commonly known as:  GLUCOPHAGE  Take 500 mg by mouth 2 (two) times daily with a meal.      metoprolol tartrate 25 MG tablet  Commonly known as:  LOPRESSOR  Take 1 tablet (25 mg total) by mouth 2 (two) times daily.     mometasone 50 MCG/ACT nasal spray  Commonly known as:  NASONEX  Place 2 sprays into the nose daily.     MULTIVITAMIN PO  Take 1 tablet by mouth daily.     PROAIR HFA 108 (90 BASE) MCG/ACT inhaler  Generic drug:  albuterol  inhale 1 to 2 puffs every 4 hours if needed for wheezing     traMADol 50 MG tablet  Commonly known as:  ULTRAM  Take 50-100 mg by mouth every 6 (six) hours as needed for moderate pain.     vitamin B-12 1000 MCG tablet  Commonly known as:  CYANOCOBALAMIN  Take 1,000 mcg by mouth daily.     Vitamin D (Ergocalciferol) 50000 UNITS Caps capsule  Commonly known as:  DRISDOL  Take 1 capsule (50,000 Units total) by mouth every 7 (seven) days.       Allergies  Allergen Reactions  . Epinephrine Other (See Comments)    REACTION: nervous, convulsions  . Macrolides And Ketolides Other (See Comments)    All mycins  Cause yeast infections  . Cephalexin Diarrhea and Nausea Only  . Codeine Nausea Only  . Morphine Nausea Only  . Sulfamethoxazole-Trimethoprim Diarrhea and Nausea Only   Follow-up Information   Follow up with [facility] On 05/20/2013. (At 11:00 AM for wound check)    Specialty:  Cardiology   Contact information:   [address], [address] [place] [address] [phone]      Follow up with Thompson Grayer, MD In 3 months. (Our office will notify you of your appointment date and time)    Specialty:  Cardiology   Contact information:   [address] [place] [address] [phone]       Follow up with NADEL,SCOTT M, MD. (Call to be seen in 1-2 weeks after discharge from skilled nursing facility)    Specialty:  Pulmonary Disease   Contact information:   [address]  [place] [address] [phone]      Labs: Basic Metabolic Panel:  Recent Labs Lab 05/06/13 0515  05/08/13 0503 05/09/13 0310 05/11/13 0558 05/12/13 0058  NA 143 137 138 139 137  K 3.3* 5.6* 5.1 5.1 4.1  CL 98 99 100 99 99  CO2 29 24 24 26 25   GLUCOSE 42* 150* 153* 153* 141*  BUN 34* 20 17 17 13   CREATININE 0.60 0.59 0.55 0.55 0.51  CALCIUM 9.7 9.8 9.5 9.4 9.0   CBG:  Recent Labs Lab 05/11/13 1138 05/11/13 1619 05/11/13 2057 05/12/13 0611 05/12/13 1123  GLUCAP 153* 164* 157* 146* 153*   Signed:  Samella Parr ANP [facility] 05/12/2013, 2:29 PM  I have personally examined this patient and reviewed the entire database. I have reviewed the above note, made any necessary editorial changes, and agree with its content.  Cherene Altes, MD [facility]

## 2013-05-12 NOTE — Clinical Social Work Note (Signed)
Clinical Social Worker facilitated patient discharge including contacting patient family and facility to confirm patient discharge plans.  Clinical information faxed to facility and family agreeable with plan.  CSW arranged ambulance transport via [facility] to [facility].  RN to call report prior to discharge.  Clinical Social Worker will sign off for now as social work intervention is no longer needed. Please consult [place] again if new need arises.  Barbette Or, [place]

## 2013-05-12 NOTE — Progress Notes (Signed)
Patient discharged to rehab. Report called and given to RN. Roxan Hockey

## 2013-05-17 ENCOUNTER — Telehealth: Payer: Self-pay | Admitting: Medical Oncology

## 2013-05-17 ENCOUNTER — Ambulatory Visit (HOSPITAL_COMMUNITY): Payer: Medicare Other

## 2013-05-17 ENCOUNTER — Telehealth: Payer: Self-pay | Admitting: Pulmonary Disease

## 2013-05-17 NOTE — Telephone Encounter (Signed)
In rehab facility [name] for several weeks . Pt will call back to r/s.

## 2013-05-17 NOTE — Telephone Encounter (Signed)
ATC line rang several times. No VM and an answer [facility]

## 2013-05-18 ENCOUNTER — Encounter (HOSPITAL_COMMUNITY): Payer: Self-pay | Admitting: *Deleted

## 2013-05-18 NOTE — Telephone Encounter (Signed)
appt has been cancelled. [id number]. Estelle Bing, CMA

## 2013-05-18 NOTE — Telephone Encounter (Signed)
Please call back [phone] needs asap call back

## 2013-05-18 NOTE — Telephone Encounter (Signed)
Pt's husband is aware that the CT appointment has been canceled.

## 2013-05-20 ENCOUNTER — Ambulatory Visit (INDEPENDENT_AMBULATORY_CARE_PROVIDER_SITE_OTHER): Payer: Medicare Other | Admitting: *Deleted

## 2013-05-20 ENCOUNTER — Other Ambulatory Visit: Payer: Medicare Other | Admitting: Lab

## 2013-05-20 ENCOUNTER — Ambulatory Visit: Payer: Medicare Other | Admitting: Internal Medicine

## 2013-05-20 DIAGNOSIS — I4891 Unspecified atrial fibrillation: Secondary | ICD-10-CM

## 2013-05-20 LAB — MDC_IDC_ENUM_SESS_TYPE_INCLINIC
Battery Remaining Longevity: 128.4 mo
Battery Voltage: 3.05 V
Implantable Pulse Generator Model: 1160
Implantable Pulse Generator Serial Number: 7480835
Lead Channel Pacing Threshold Amplitude: 0.625 V
Lead Channel Pacing Threshold Pulse Width: 0.4 ms
Lead Channel Setting Pacing Amplitude: 0.875
Lead Channel Setting Pacing Pulse Width: 0.4 ms
Lead Channel Setting Sensing Sensitivity: 2 mV
MDC IDC MSMT LEADCHNL RV IMPEDANCE VALUE: 525 Ohm
MDC IDC MSMT LEADCHNL RV SENSING INTR AMPL: 6.1 mV
MDC IDC SESS DTM: 20150416114635
MDC IDC STAT BRADY RV PERCENT PACED: 99.96 %

## 2013-05-20 NOTE — Progress Notes (Signed)
Wound check appointment. Steri-strips removed. Wound without redness or edema. Incision edges approximated, wound well healed. Normal device function. Thresholds, sensing, and impedances consistent with implant measurements. Device programmed at 3.5V/auto capture programmed on for extra safety margin until 3 month visit. Histogram distribution appropriate for patient and level of activity. No high ventricular rates noted.  Base rate to 80 and rate response on today.   Patient educated about wound care, arm mobility, lifting restrictions. ROV 1 month with the device clinic for rate reduction per ablation protocol. and 3 months with implanting physician.

## 2013-05-24 ENCOUNTER — Ambulatory Visit: Payer: Medicare Other | Admitting: Internal Medicine

## 2013-05-24 ENCOUNTER — Other Ambulatory Visit: Payer: Medicare Other

## 2013-06-10 ENCOUNTER — Telehealth: Payer: Self-pay | Admitting: Pulmonary Disease

## 2013-06-10 ENCOUNTER — Encounter: Payer: Self-pay | Admitting: Internal Medicine

## 2013-06-10 NOTE — Telephone Encounter (Signed)
Pt calling agains says that it is very important that you call her back.[name]

## 2013-06-10 NOTE — Telephone Encounter (Signed)
Called and spoke with pt and she stated that she has been in rehab x 5 weeks.  She stated that they are wanting to send her home with PT from [facility] but the pts insurance will not allow them to schedule this---pt stated that they need [place] to call and set this up with [facility]---we need to call [facility] at [phone] to get this set up for the pt.  [employer] #  [phone].  Pt is aware that i will call tomorrow to see if we can get this set up.

## 2013-06-10 NOTE — Telephone Encounter (Signed)
Called and spoke with patient- Refused to speak with me--requests to speak with Leigh ONLY  Requests a call back after 12pm  Will send to [facility] to contact patient.

## 2013-06-11 NOTE — Telephone Encounter (Signed)
i attempted to call the insurance company at the number below.  They needed the CPT codes and were asking for other information that we do not have and that the facility that the pt is in would have.  Spoke with SN and he stated that in order for [place] to set the home PT up for the pt she will need to come in an be seen on Wednesday.  Called and spoke with pts husband and he is aware of SN recs to have the appt but he was not very happy with this.  He stated that if the facility cannot get the PT scheduled they will be here for the appt.  They will call back to cancel if needed.

## 2013-06-12 ENCOUNTER — Emergency Department (HOSPITAL_COMMUNITY)
Admission: EM | Admit: 2013-06-12 | Discharge: 2013-06-12 | Disposition: A | Discharge status: Home / Self Care | Payer: Medicare Other | Attending: Emergency Medicine | Admitting: Emergency Medicine

## 2013-06-12 ENCOUNTER — Encounter (HOSPITAL_COMMUNITY): Payer: Self-pay | Admitting: Emergency Medicine

## 2013-06-12 DIAGNOSIS — IMO0002 Reserved for concepts with insufficient information to code with codable children: Secondary | ICD-10-CM | POA: Insufficient documentation

## 2013-06-12 DIAGNOSIS — Y9389 Activity, other specified: Secondary | ICD-10-CM | POA: Insufficient documentation

## 2013-06-12 DIAGNOSIS — E119 Type 2 diabetes mellitus without complications: Secondary | ICD-10-CM | POA: Insufficient documentation

## 2013-06-12 DIAGNOSIS — J449 Chronic obstructive pulmonary disease, unspecified: Secondary | ICD-10-CM | POA: Insufficient documentation

## 2013-06-12 DIAGNOSIS — Z87442 Personal history of urinary calculi: Secondary | ICD-10-CM | POA: Insufficient documentation

## 2013-06-12 DIAGNOSIS — Z85118 Personal history of other malignant neoplasm of bronchus and lung: Secondary | ICD-10-CM | POA: Insufficient documentation

## 2013-06-12 DIAGNOSIS — K2970 Gastritis, unspecified, without bleeding: Secondary | ICD-10-CM | POA: Insufficient documentation

## 2013-06-12 DIAGNOSIS — Z950 Presence of cardiac pacemaker: Secondary | ICD-10-CM | POA: Insufficient documentation

## 2013-06-12 DIAGNOSIS — E785 Hyperlipidemia, unspecified: Secondary | ICD-10-CM | POA: Insufficient documentation

## 2013-06-12 DIAGNOSIS — R259 Unspecified abnormal involuntary movements: Secondary | ICD-10-CM | POA: Insufficient documentation

## 2013-06-12 DIAGNOSIS — E669 Obesity, unspecified: Secondary | ICD-10-CM | POA: Insufficient documentation

## 2013-06-12 DIAGNOSIS — Z87891 Personal history of nicotine dependence: Secondary | ICD-10-CM | POA: Insufficient documentation

## 2013-06-12 DIAGNOSIS — Z7901 Long term (current) use of anticoagulants: Secondary | ICD-10-CM | POA: Insufficient documentation

## 2013-06-12 DIAGNOSIS — I739 Peripheral vascular disease, unspecified: Secondary | ICD-10-CM | POA: Insufficient documentation

## 2013-06-12 DIAGNOSIS — I10 Essential (primary) hypertension: Secondary | ICD-10-CM | POA: Insufficient documentation

## 2013-06-12 DIAGNOSIS — Z860100 Personal history of colon polyps, unspecified: Secondary | ICD-10-CM | POA: Insufficient documentation

## 2013-06-12 DIAGNOSIS — Z8551 Personal history of malignant neoplasm of bladder: Secondary | ICD-10-CM | POA: Insufficient documentation

## 2013-06-12 DIAGNOSIS — Z043 Encounter for examination and observation following other accident: Secondary | ICD-10-CM | POA: Insufficient documentation

## 2013-06-12 DIAGNOSIS — W06XXXA Fall from bed, initial encounter: Secondary | ICD-10-CM | POA: Insufficient documentation

## 2013-06-12 DIAGNOSIS — Y929 Unspecified place or not applicable: Secondary | ICD-10-CM | POA: Insufficient documentation

## 2013-06-12 DIAGNOSIS — Z8739 Personal history of other diseases of the musculoskeletal system and connective tissue: Secondary | ICD-10-CM | POA: Insufficient documentation

## 2013-06-12 DIAGNOSIS — Z79899 Other long term (current) drug therapy: Secondary | ICD-10-CM | POA: Insufficient documentation

## 2013-06-12 DIAGNOSIS — Z9889 Other specified postprocedural states: Secondary | ICD-10-CM | POA: Insufficient documentation

## 2013-06-12 DIAGNOSIS — J4489 Other specified chronic obstructive pulmonary disease: Secondary | ICD-10-CM | POA: Insufficient documentation

## 2013-06-12 DIAGNOSIS — K219 Gastro-esophageal reflux disease without esophagitis: Secondary | ICD-10-CM | POA: Insufficient documentation

## 2013-06-12 DIAGNOSIS — F411 Generalized anxiety disorder: Secondary | ICD-10-CM | POA: Insufficient documentation

## 2013-06-12 DIAGNOSIS — T50905A Adverse effect of unspecified drugs, medicaments and biological substances, initial encounter: Secondary | ICD-10-CM

## 2013-06-12 DIAGNOSIS — K2990 Gastroduodenitis, unspecified, without bleeding: Secondary | ICD-10-CM

## 2013-06-12 HISTORY — DX: Unspecified atrial fibrillation: I48.91

## 2013-06-12 LAB — COMPREHENSIVE METABOLIC PANEL
ALK PHOS: 31 U/L — AB (ref 39–117)
ALT: 15 U/L (ref 0–35)
AST: 33 U/L (ref 0–37)
Albumin: 3.3 g/dL — ABNORMAL LOW (ref 3.5–5.2)
BILIRUBIN TOTAL: 0.4 mg/dL (ref 0.3–1.2)
BUN: 15 mg/dL (ref 6–23)
CHLORIDE: 95 meq/L — AB (ref 96–112)
CO2: 35 meq/L — AB (ref 19–32)
Calcium: 10 mg/dL (ref 8.4–10.5)
Creatinine, Ser: 1.02 mg/dL (ref 0.50–1.10)
GFR calc Af Amer: 60 mL/min — ABNORMAL LOW (ref >90)
GFR, EST NON AFRICAN AMERICAN: 52 mL/min — AB (ref >90)
Glucose, Bld: 149 mg/dL — ABNORMAL HIGH (ref 70–99)
Potassium: 3.6 mEq/L — ABNORMAL LOW (ref 3.7–5.3)
SODIUM: 142 meq/L (ref 137–147)
Total Protein: 6.8 g/dL (ref 6.0–8.3)

## 2013-06-12 LAB — URINALYSIS, ROUTINE W REFLEX MICROSCOPIC
Bilirubin Urine: NEGATIVE
GLUCOSE, UA: NEGATIVE mg/dL
Hgb urine dipstick: NEGATIVE
KETONES UR: NEGATIVE mg/dL
Nitrite: NEGATIVE
Protein, ur: NEGATIVE mg/dL
Specific Gravity, Urine: 1.01 (ref 1.005–1.030)
UROBILINOGEN UA: 0.2 mg/dL (ref 0.0–1.0)
pH: 6 (ref 5.0–8.0)

## 2013-06-12 LAB — CBC
HCT: 35.7 % — ABNORMAL LOW (ref 36.0–46.0)
HEMOGLOBIN: 10.9 g/dL — AB (ref 12.0–15.0)
MCH: 30.5 pg (ref 26.0–34.0)
MCHC: 30.5 g/dL (ref 30.0–36.0)
MCV: 100 fL (ref 78.0–100.0)
Platelets: 295 10*3/uL (ref 150–400)
RBC: 3.57 MIL/uL — AB (ref 3.87–5.11)
RDW: 14.4 % (ref 11.5–15.5)
WBC: 6.4 10*3/uL (ref 4.0–10.5)

## 2013-06-12 LAB — URINE MICROSCOPIC-ADD ON

## 2013-06-12 NOTE — ED Provider Notes (Signed)
CSN: [id number]     Arrival date & time 06/12/13  1321 History   First MD Initiated Contact with Patient 06/12/13 1510     Chief Complaint  Patient presents with  . Tremors      HPI  She presents with a tremor. She said she has always had a fine resting tremor "for years". She is complaining of some pain in her legs. Husband states she requested Tylenol the care facility where she is at. He states that Lyrica which started 2 days ago. The last 48 hours she's had intermittent tremor and shaking of her legs and to lesser extent her arms. She states it was worse last night to potential walking. She had a fall while she was getting out of bed up on the floor on her buttocks. She did not strike her head. She is not injured from the fall. He otherwise feels well. She has a shunt on her legs which she states she wears chronically because of edema. She states that the amount of edema is much less. No dysuria or frequency. No fevers no chills. Good appetite without nausea or vomiting.  Past Medical History  Diagnosis Date  . Asthma   . COPD (chronic obstructive pulmonary disease)   . History of lung cancer   . Hypertension   . Permanent atrial fibrillation   . Peripheral vascular disease   . Venous insufficiency   . Hyperlipidemia   . Diabetes mellitus   . Nontoxic multinodular goiter   . Obesity   . GERD (gastroesophageal reflux disease)   . History of nephrolithiasis   . History of bladder cancer   . DJD (degenerative joint disease)   . Osteopenia   . Anxiety   . History of colon polyps 07/233/2009    Tubular Adenomas  . Diverticulosis of sigmoid colon     mild  . Gastritis     moderate  . Hemorrhoids, internal   . Claustrophobia   . Thyroid nodule   . Chronic anticoagulation     Eliquis  . Atrial fibrillation    Past Surgical History  Procedure Laterality Date  . S/p right rotator cuff repair  1991    Dr. Wonda Olds, right  . S/p turbt  10/01    Dr. Terance Hart  . S/p left tkr   2002    Dr. Gladstone Lighter  . 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 S/p d&c for removal of endometrial polyp (benign)  12/10    GYN  . Colonoscopy    . Esophagogastroduodenoscopy    . Cardioversion N/A 01/17/2013    Procedure: CARDIOVERSION/BEDSIDE (5D66);  Surgeon: Evans Lance, MD;  Location: [facility];  Service: Cardiovascular;  Laterality: N/A;  . Pacemaker insertion  05/11/2013    single chamber MDT pacemaker implanted following AVN ablation for afib with RVR  . Ablation  05/11/2013    AVN ablation by Dr Lovena Le for afib with RVR   Family History  Problem Relation Age of Onset  . Asthma Mother   . Heart failure Mother   . Heart attack Father   . Ulcers Father   . Colon cancer Neg Hx    History  Substance Use Topics  . Smoking status: Former Smoker -- 0.50 packs/day for 39 years    Types: Cigarettes  Quit date: 02/04/1994  . Smokeless tobacco: Never Used  . Alcohol Use: Yes     Comment: Occasionally   OB History   Grav Para Term Preterm Abortions TAB SAB Ect Mult Living                 Review of Systems  Constitutional: Negative for fever, chills, diaphoresis, appetite change and fatigue.  HENT: Negative for mouth sores, sore throat and trouble swallowing.   Eyes: Negative for visual disturbance.  Respiratory: Negative for cough, chest tightness, shortness of breath and wheezing.   Cardiovascular: Negative for chest pain.  Gastrointestinal: Negative for nausea, vomiting, abdominal pain, diarrhea and abdominal distention.  Endocrine: Negative for polydipsia, polyphagia and polyuria.  Genitourinary: Negative for dysuria, frequency and hematuria.  Musculoskeletal: Negative for gait problem.  Skin: Negative for color change, pallor and rash.  Neurological: Positive for tremors. Negative for dizziness, syncope,  light-headedness and headaches.  Hematological: Does not bruise/bleed easily.  Psychiatric/Behavioral: Negative for behavioral problems and confusion.      Allergies  Epinephrine; Macrolides and ketolides; Cephalexin; Codeine; Morphine; and Sulfamethoxazole-trimethoprim  Home Medications   Prior to Admission medications   Medication Sig Start Date End Date Taking? Authorizing Provider  acidophilus (RISAQUAD) CAPS capsule Take 1 capsule by mouth daily. 05/12/13  Yes Samella Parr, NP  ALPRAZolam Duanne Moron) 0.25 MG tablet Take 0.25 mg by mouth at bedtime as needed for anxiety.   Yes Historical Provider, MD  apixaban (ELIQUIS) 5 MG TABS tablet Take 5 mg by mouth 2 (two) times daily.   Yes Historical Provider, MD  budesonide (PULMICORT) 0.5 MG/2ML nebulizer solution Take 0.5 mg by nebulization 2 (two) times daily.   Yes Historical Provider, MD  Coenzyme Q10 (COQ10) 100 MG CAPS Take 300 mg by mouth daily.   Yes Historical Provider, MD  dextromethorphan-guaiFENesin (MUCINEX DM) 30-600 MG per 12 hr tablet Take 1 tablet by mouth every 12 (twelve) hours.   Yes Historical Provider, MD  famotidine (PEPCID) 20 MG tablet Take 20 mg by mouth 2 (two) times daily.    Yes Historical Provider, MD  furosemide (LASIX) 40 MG tablet Take 40 mg by mouth daily.    Yes Historical Provider, MD  levalbuterol Penne Lash) 0.63 MG/3ML nebulizer solution Take 0.63 mg by nebulization every 4 (four) hours as needed for wheezing or shortness of breath.   Yes Historical Provider, MD  metFORMIN (GLUCOPHAGE) 500 MG tablet Take 500 mg by mouth 2 (two) times daily with a meal.   Yes Historical Provider, MD  metoprolol tartrate (LOPRESSOR) 25 MG tablet Take 1 tablet (25 mg total) by mouth 2 (two) times daily. 05/12/13  Yes Samella Parr, NP  mometasone (NASONEX) 50 MCG/ACT nasal spray Place 2 sprays into both nostrils daily.    Yes Historical Provider, MD  ondansetron (ZOFRAN) 4 MG tablet Take 4 mg by mouth every 8 (eight) hours as  needed for nausea or vomiting.   Yes Historical Provider, MD  pregabalin (LYRICA) 50 MG capsule Take 50 mg by mouth 3 (three) times daily.   Yes Historical Provider, MD  PROAIR HFA 108 (90 BASE) MCG/ACT inhaler inhale 1 to 2 puffs every 4 hours if needed for wheezing 03/25/13  Yes Noralee Space, MD  Vitamin D, Ergocalciferol, (DRISDOL) 50000 UNITS CAPS Take 1 capsule (50,000 Units total) by mouth every 7 (seven) days. 05/07/12  Yes Noralee Space, MD   BP 130/64  Pulse 80  Temp(Src) 98 F (36.7 C) (Oral)  Resp 20  SpO2 94% Physical Exam  Constitutional: She is oriented to person, place, and time. She appears well-developed and well-nourished. No distress.  Seen in bed. Currently by her husband. Awake alert.  HENT:  Head: Normocephalic.  Nontender the scalp and skull. No sign of trauma the head. Dry mouth.  Eyes: Conjunctivae are normal. Pupils are equal, round, and reactive to light. No scleral icterus.  Neck: Normal range of motion. Neck supple. No thyromegaly present.  Cardiovascular: Normal rate and regular rhythm.  Exam reveals no gallop and no friction rub.   No murmur heard. Pulmonary/Chest: Effort normal and breath sounds normal. No respiratory distress. She has no wheezes. She has no rales.  Abdominal: Soft. Bowel sounds are normal. She exhibits no distension. There is no tenderness. There is no rebound.  Musculoskeletal: Normal range of motion.  Neurological: She is alert and oriented to person, place, and time.  Intermittent coarse bilateral extremity tremor. No focal deficits in strength or sensation.  Skin: Skin is warm and dry. No rash noted.  1-2+ lower extremity edema. Chest is compressive dressings in place. No wounds.  Psychiatric: She has a normal mood and affect. Her behavior is normal.    ED Course  Procedures (including critical care time) Labs Review Labs Reviewed  COMPREHENSIVE METABOLIC PANEL - Abnormal; Notable for the following:    Potassium 3.6 (*)     Chloride 95 (*)    CO2 35 (*)    Glucose, Bld 149 (*)    Albumin 3.3 (*)    Alkaline Phosphatase 31 (*)    GFR calc non Af Amer 52 (*)    GFR calc Af Amer 60 (*)    All other components within normal limits  URINALYSIS, ROUTINE W REFLEX MICROSCOPIC - Abnormal; Notable for the following:    Leukocytes, UA SMALL (*)    All other components within normal limits  CBC - Abnormal; Notable for the following:    RBC 3.57 (*)    Hemoglobin 10.9 (*)    HCT 35.7 (*)    All other components within normal limits  URINE CULTURE  URINE MICROSCOPIC-ADD ON    Imaging Review No results found.   EKG Interpretation None      MDM   Final diagnoses:  Medication reaction    Patient's hemoglobin 10.9. He was 20 1213 while in the hospital. She's not bleeding. Not having dark stools  Have asked her to have this rechecked with her primary care physician or her physician at rehabilitation. Obviously rechecking earlier she should show any sign of bleeding. Potassium 3.6 with moderate hemolysis. Sodium 142 urine shows cells but no bacteria culture pending. I told her symptoms are explained by her Lyrica.  An acid taste upper Lyrica. He should expect resolution of symptoms in 24-48 hours. Recheck any worsening symptoms.   Tanna Furry, MD 06/12/13 1626

## 2013-06-12 NOTE — ED Notes (Signed)
Pt had no tremors when taking swab and putting in mouth.

## 2013-06-12 NOTE — ED Notes (Signed)
Pt from [facility] home. Pt began having fine tremors with grogginess and slurred speech that started Thursday and has gotten progressively worse. Pt started on Lyrica 3 days ago. Pt also had fall yesterday while transferring between bed and chair due to tremors/uncoordination. Pt has fine tremors on arrival.

## 2013-06-12 NOTE — Discharge Instructions (Signed)
Please stop Lyrica.  Tylenol 650mg  every 6 hours as needed for pain.  Your Blood Count (Hemoglobin), is down slightly vs your DC from the hospital.  (10.9, down from 12.6).  Please have your blood count rechecked within the next 7-10 days.  If you develop any bleeding, or dark stools, worsening weakness, or other worsening symptoms, then re-check.

## 2013-06-12 NOTE — ED Notes (Signed)
Bed: QT92 Expected date:  Expected time:  Means of arrival:  Comments: EMS tremors

## 2013-06-14 LAB — URINE CULTURE: Colony Count: 60000

## 2013-06-16 ENCOUNTER — Telehealth (HOSPITAL_BASED_OUTPATIENT_CLINIC_OR_DEPARTMENT_OTHER): Payer: Self-pay | Admitting: Emergency Medicine

## 2013-06-16 ENCOUNTER — Ambulatory Visit: Payer: Medicare Other | Admitting: Pulmonary Disease

## 2013-06-16 NOTE — Telephone Encounter (Signed)
Post ED Visit - Positive Culture Follow-up  Culture report reviewed by antimicrobial stewardship pharmacist: []  Wes Balfour, Pharm.D., BCPS [x]  Heide Guile, Pharm.D., BCPS []  Alycia Rossetti, Pharm.D., BCPS []  [place], Pharm.D., BCPS, AAHIVP []  Legrand Como, Pharm.D., BCPS, AAHIVP []  Juliene Pina, Pharm.D.  Positive urine culture Per [facility] PA-C, no treatment needed and no further patient follow-up is required at this time.  Angelyne Terwilliger 06/16/2013, 10:06 AM

## 2013-07-05 DEATH — deceased

## 2013-07-12 ENCOUNTER — Ambulatory Visit: Payer: Medicare Other | Admitting: Internal Medicine

## 2014-01-13 ENCOUNTER — Encounter (HOSPITAL_COMMUNITY): Payer: Self-pay | Admitting: Internal Medicine

## 2014-08-01 ENCOUNTER — Other Ambulatory Visit: Payer: Self-pay

## 2014-08-10 ENCOUNTER — Encounter: Payer: Self-pay | Admitting: Internal Medicine

## 2014-12-10 IMAGING — CR DG CHEST 2V
2 series · 2 of 2 positions shown · non-contrast
Comparison: DG CHEST 1V PORT dated 05/04/2013

CLINICAL DATA: Status post pacemaker

EXAM:
CHEST  2 VIEW

[w chest pa]
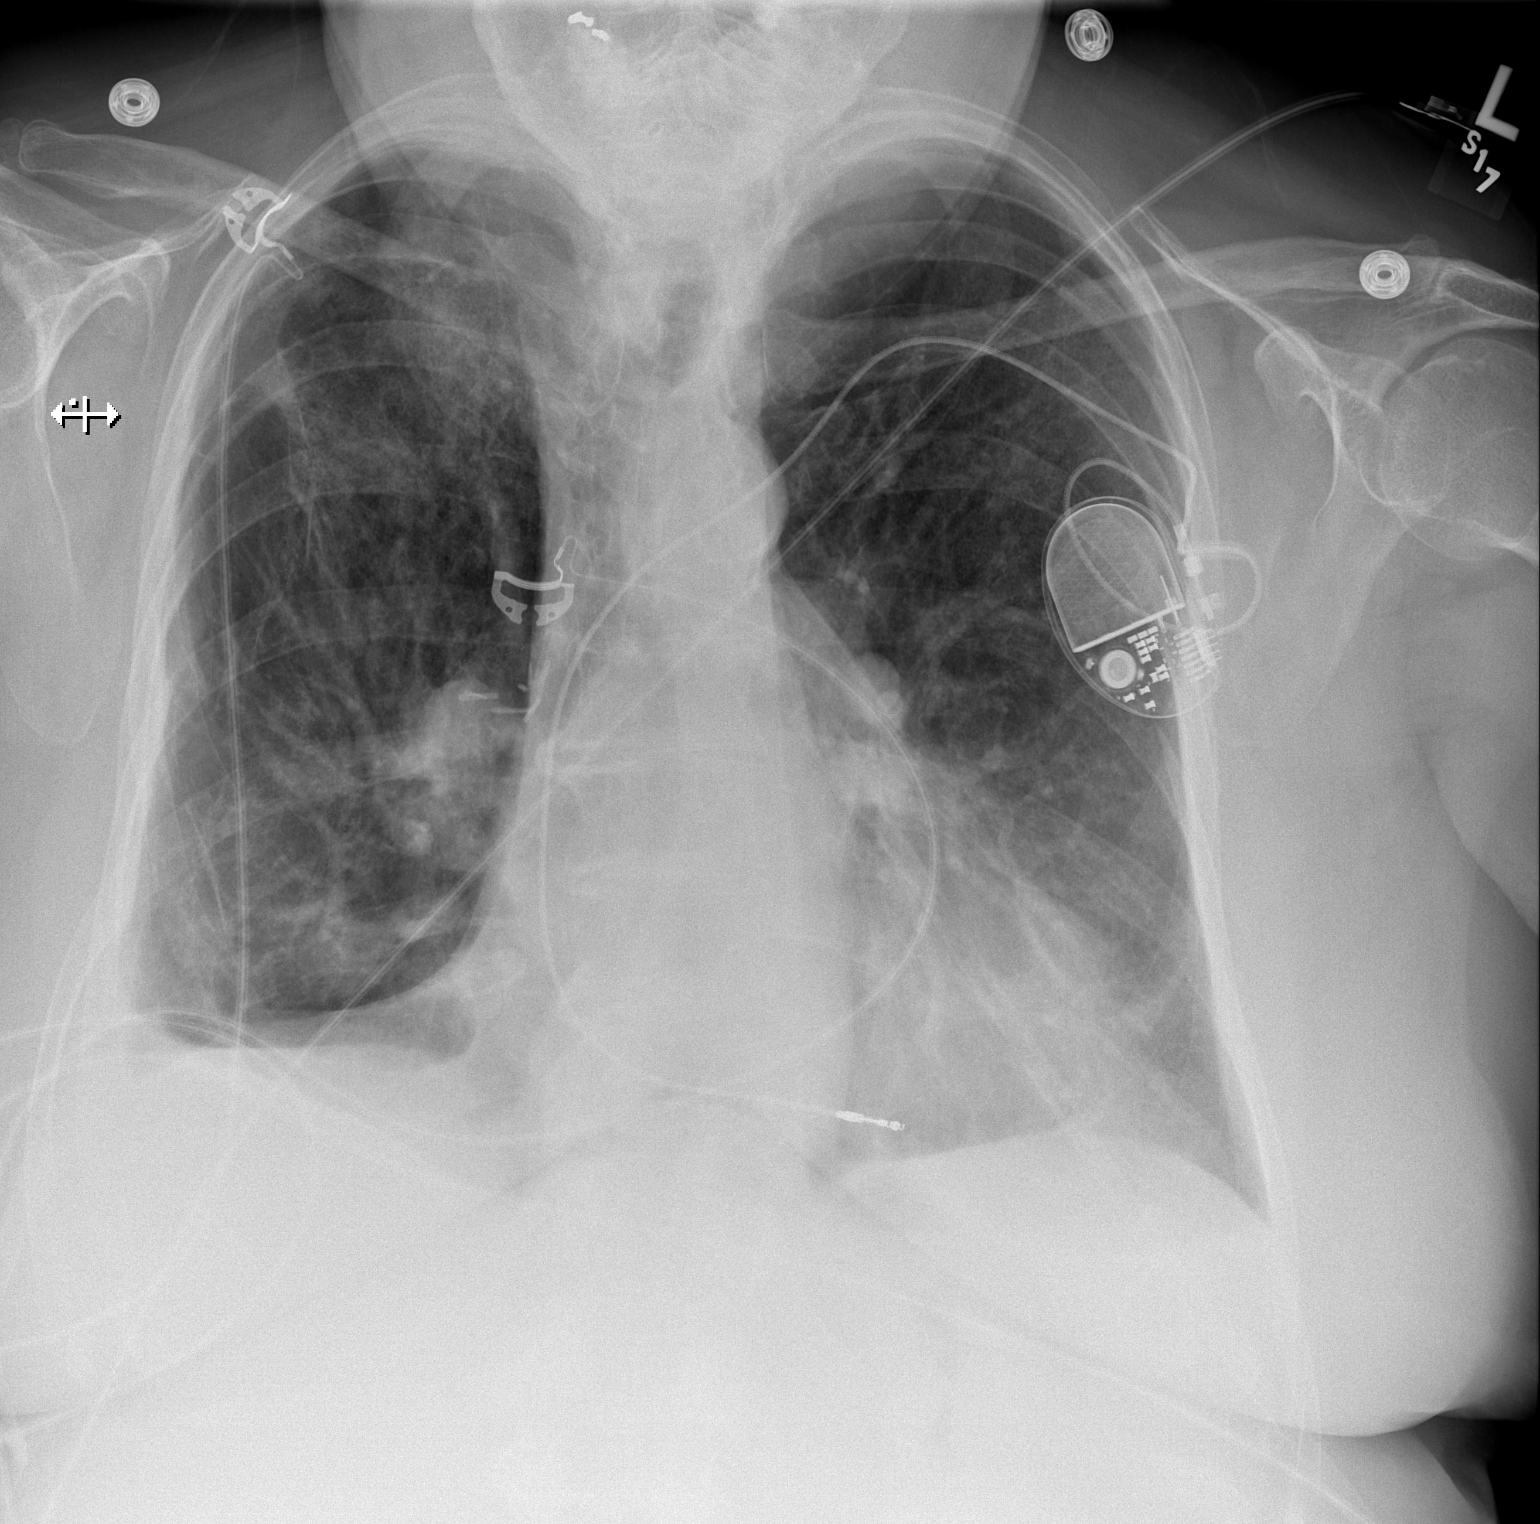

[w chest lat]
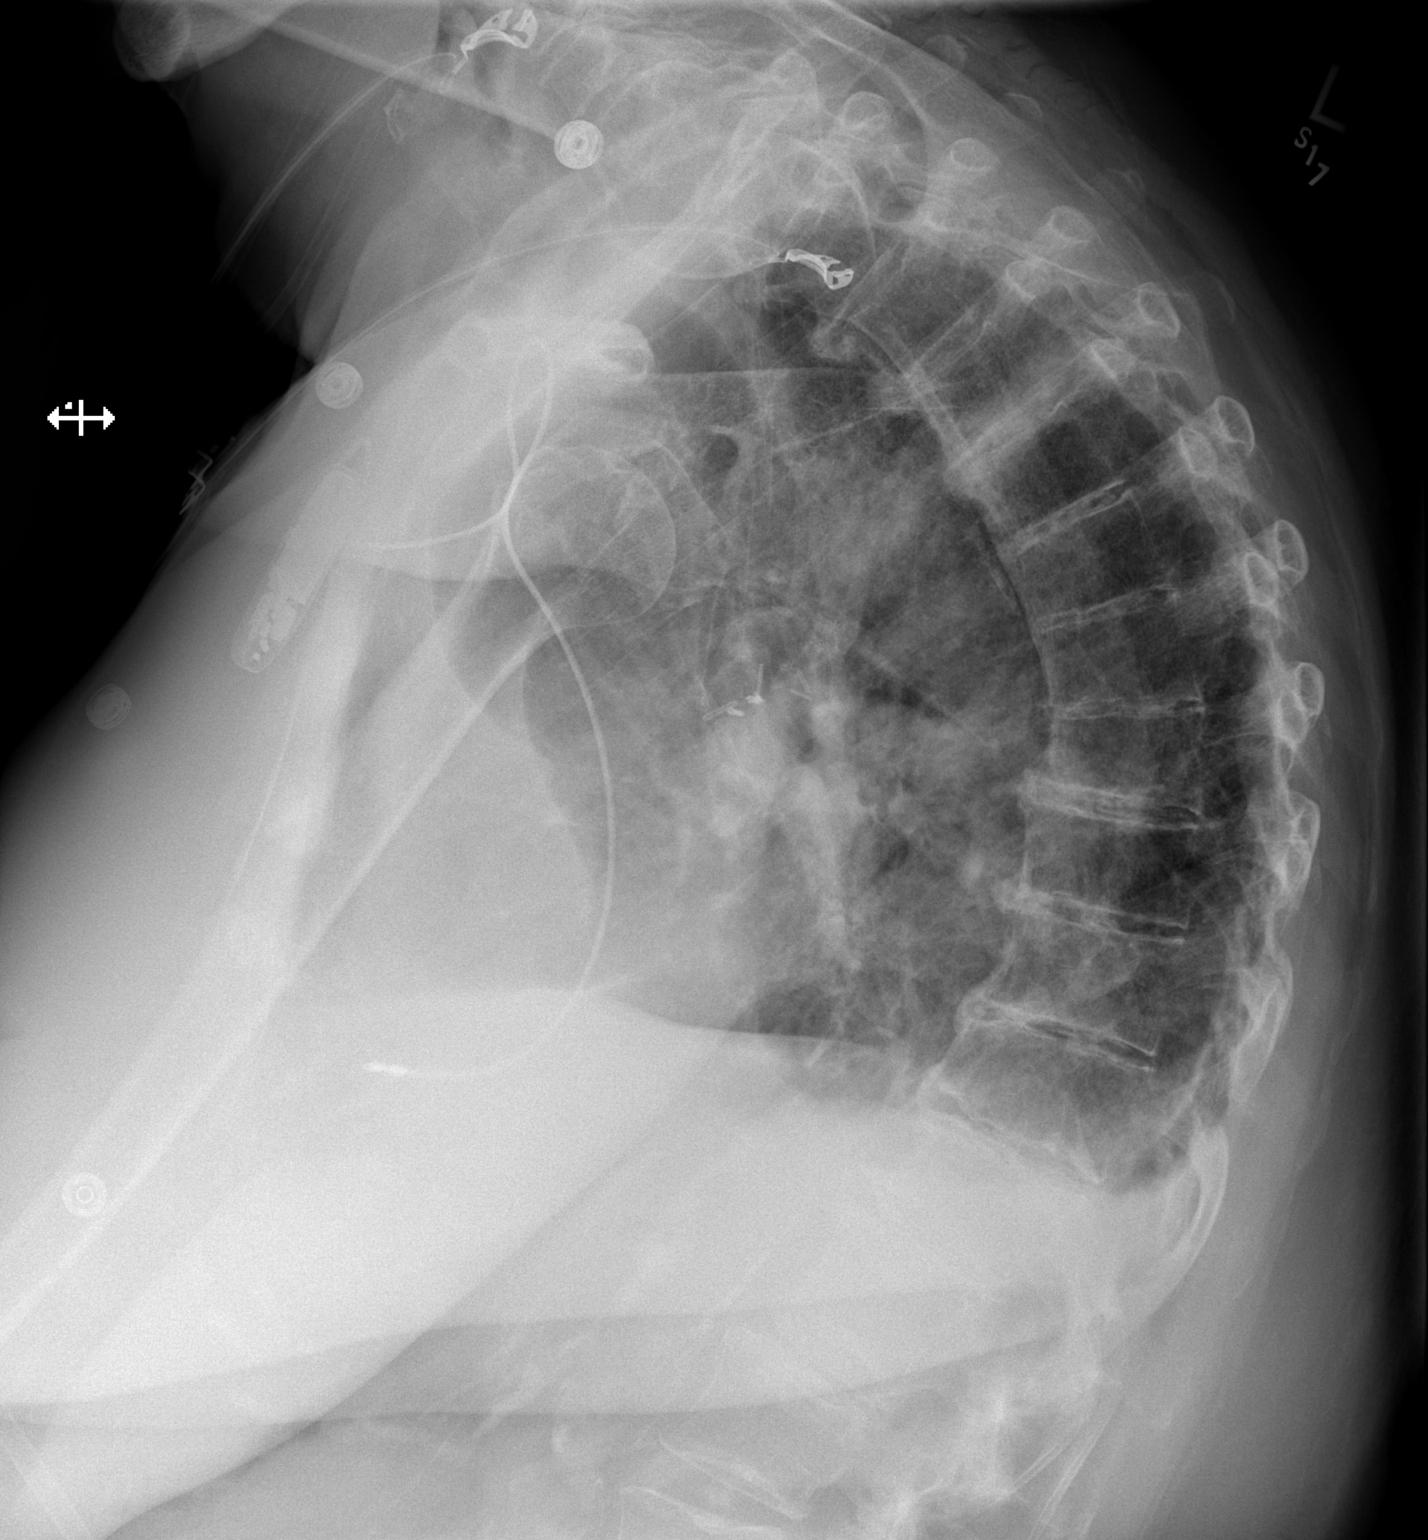

[2 of 2 positions shown; findings below may reference images not displayed]

FINDINGS: Single lead cardiac pacer has been placed with generator over the
left thorax. Lead projects over the ventricles. No pneumothorax
identified.

There is postsurgical change in the right upper lobe, with hazy
parenchymal density suggesting scarring. There is a small right
pleural effusion, similar to prior study. Mild cardiac enlargement
is noted.
IMPRESSION: No pneumothorax status post pacer placement
